# Patient Record
Sex: Male | Born: 1950
Health system: Southern US, Community
[De-identification: ages and names within clinical notes are randomized; demographics above are authoritative.]

## PROBLEM LIST (undated history)

## (undated) DIAGNOSIS — Z9289 Personal history of other medical treatment: Secondary | ICD-10-CM

## (undated) DIAGNOSIS — T4145XA Adverse effect of unspecified anesthetic, initial encounter: Secondary | ICD-10-CM

## (undated) DIAGNOSIS — G63 Polyneuropathy in diseases classified elsewhere: Secondary | ICD-10-CM

## (undated) DIAGNOSIS — M1990 Unspecified osteoarthritis, unspecified site: Secondary | ICD-10-CM

## (undated) DIAGNOSIS — Z95810 Presence of automatic (implantable) cardiac defibrillator: Secondary | ICD-10-CM

## (undated) DIAGNOSIS — N183 Chronic kidney disease, stage 3 unspecified: Secondary | ICD-10-CM

## (undated) DIAGNOSIS — Z720 Tobacco use: Secondary | ICD-10-CM

## (undated) DIAGNOSIS — I509 Heart failure, unspecified: Secondary | ICD-10-CM

## (undated) DIAGNOSIS — E349 Endocrine disorder, unspecified: Secondary | ICD-10-CM

## (undated) DIAGNOSIS — I350 Nonrheumatic aortic (valve) stenosis: Secondary | ICD-10-CM

## (undated) DIAGNOSIS — I10 Essential (primary) hypertension: Secondary | ICD-10-CM

## (undated) DIAGNOSIS — I2510 Atherosclerotic heart disease of native coronary artery without angina pectoris: Secondary | ICD-10-CM

## (undated) DIAGNOSIS — T8859XA Other complications of anesthesia, initial encounter: Secondary | ICD-10-CM

## (undated) DIAGNOSIS — K219 Gastro-esophageal reflux disease without esophagitis: Secondary | ICD-10-CM

## (undated) DIAGNOSIS — E1165 Type 2 diabetes mellitus with hyperglycemia: Secondary | ICD-10-CM

## (undated) DIAGNOSIS — I499 Cardiac arrhythmia, unspecified: Secondary | ICD-10-CM

## (undated) DIAGNOSIS — L97529 Non-pressure chronic ulcer of other part of left foot with unspecified severity: Secondary | ICD-10-CM

## (undated) DIAGNOSIS — IMO0002 Reserved for concepts with insufficient information to code with codable children: Secondary | ICD-10-CM

## (undated) DIAGNOSIS — E1140 Type 2 diabetes mellitus with diabetic neuropathy, unspecified: Secondary | ICD-10-CM

## (undated) DIAGNOSIS — I4892 Unspecified atrial flutter: Secondary | ICD-10-CM

## (undated) HISTORY — DX: Essential (primary) hypertension: I10

## (undated) HISTORY — DX: Chronic kidney disease, stage 3 unspecified: N18.30

## (undated) HISTORY — DX: Type 2 diabetes mellitus with diabetic neuropathy, unspecified: E11.40

## (undated) HISTORY — PX: COLONOSCOPY: SHX174

## (undated) HISTORY — DX: Chronic kidney disease, stage 3 (moderate): N18.3

## (undated) HISTORY — DX: Presence of automatic (implantable) cardiac defibrillator: Z95.810

## (undated) HISTORY — DX: Type 2 diabetes mellitus with hyperglycemia: E11.65

## (undated) HISTORY — DX: Personal history of other medical treatment: Z92.89

## (undated) HISTORY — DX: Atherosclerotic heart disease of native coronary artery without angina pectoris: I25.10

## (undated) HISTORY — DX: Nonrheumatic aortic (valve) stenosis: I35.0

## (undated) HISTORY — DX: Unspecified atrial flutter: I48.92

## (undated) HISTORY — PX: ESOPHAGOGASTRODUODENOSCOPY: SHX1529

## (undated) HISTORY — DX: Reserved for concepts with insufficient information to code with codable children: IMO0002

## (undated) HISTORY — DX: Tobacco use: Z72.0

## (undated) HISTORY — DX: Heart failure, unspecified: I50.9

## (undated) HISTORY — DX: Non-pressure chronic ulcer of other part of left foot with unspecified severity: L97.529

## (undated) SURGERY — Surgical Case
Anesthesia: *Unknown

---

## 1997-12-17 ENCOUNTER — Emergency Department (HOSPITAL_COMMUNITY): Admission: EM | Admit: 1997-12-17 | Discharge: 1997-12-17 | Payer: Self-pay | Admitting: Emergency Medicine

## 1999-10-12 ENCOUNTER — Emergency Department (HOSPITAL_COMMUNITY): Admission: EM | Admit: 1999-10-12 | Discharge: 1999-10-12 | Payer: Self-pay | Admitting: *Deleted

## 2005-06-02 ENCOUNTER — Emergency Department (HOSPITAL_COMMUNITY): Admission: EM | Admit: 2005-06-02 | Discharge: 2005-06-02 | Payer: Self-pay | Admitting: Family Medicine

## 2006-05-07 ENCOUNTER — Emergency Department (HOSPITAL_COMMUNITY): Admission: EM | Admit: 2006-05-07 | Discharge: 2006-05-07 | Payer: Self-pay | Admitting: Family Medicine

## 2006-11-10 ENCOUNTER — Emergency Department (HOSPITAL_COMMUNITY): Admission: EM | Admit: 2006-11-10 | Discharge: 2006-11-10 | Payer: Self-pay | Admitting: Family Medicine

## 2006-11-12 ENCOUNTER — Encounter (HOSPITAL_BASED_OUTPATIENT_CLINIC_OR_DEPARTMENT_OTHER): Admission: RE | Admit: 2006-11-12 | Discharge: 2007-02-10 | Payer: Self-pay | Admitting: Surgery

## 2006-11-17 ENCOUNTER — Ambulatory Visit (HOSPITAL_COMMUNITY): Admission: RE | Admit: 2006-11-17 | Discharge: 2006-11-17 | Payer: Self-pay | Admitting: Surgery

## 2006-11-30 ENCOUNTER — Ambulatory Visit: Payer: Self-pay | Admitting: Vascular Surgery

## 2007-02-17 ENCOUNTER — Encounter (HOSPITAL_BASED_OUTPATIENT_CLINIC_OR_DEPARTMENT_OTHER): Admission: RE | Admit: 2007-02-17 | Discharge: 2007-03-24 | Payer: Self-pay | Admitting: Surgery

## 2007-04-05 ENCOUNTER — Encounter (HOSPITAL_BASED_OUTPATIENT_CLINIC_OR_DEPARTMENT_OTHER): Admission: RE | Admit: 2007-04-05 | Discharge: 2007-07-04 | Payer: Self-pay | Admitting: Surgery

## 2007-05-04 ENCOUNTER — Emergency Department (HOSPITAL_COMMUNITY): Admission: EM | Admit: 2007-05-04 | Discharge: 2007-05-04 | Payer: Self-pay | Admitting: Emergency Medicine

## 2007-05-26 ENCOUNTER — Emergency Department (HOSPITAL_COMMUNITY): Admission: EM | Admit: 2007-05-26 | Discharge: 2007-05-26 | Payer: Self-pay | Admitting: Emergency Medicine

## 2007-08-01 ENCOUNTER — Encounter (HOSPITAL_BASED_OUTPATIENT_CLINIC_OR_DEPARTMENT_OTHER): Admission: RE | Admit: 2007-08-01 | Discharge: 2007-10-30 | Payer: Self-pay | Admitting: Surgery

## 2007-08-23 ENCOUNTER — Ambulatory Visit (HOSPITAL_COMMUNITY): Admission: RE | Admit: 2007-08-23 | Discharge: 2007-08-23 | Payer: Self-pay | Admitting: Surgery

## 2007-11-24 ENCOUNTER — Encounter (HOSPITAL_BASED_OUTPATIENT_CLINIC_OR_DEPARTMENT_OTHER): Admission: RE | Admit: 2007-11-24 | Discharge: 2007-12-16 | Payer: Self-pay | Admitting: Surgery

## 2008-03-22 ENCOUNTER — Emergency Department (HOSPITAL_COMMUNITY): Admission: EM | Admit: 2008-03-22 | Discharge: 2008-03-22 | Payer: Self-pay | Admitting: Family Medicine

## 2008-05-30 ENCOUNTER — Emergency Department (HOSPITAL_COMMUNITY): Admission: EM | Admit: 2008-05-30 | Discharge: 2008-05-30 | Payer: Self-pay | Admitting: Emergency Medicine

## 2008-06-14 ENCOUNTER — Encounter (HOSPITAL_BASED_OUTPATIENT_CLINIC_OR_DEPARTMENT_OTHER): Admission: RE | Admit: 2008-06-14 | Discharge: 2008-07-05 | Payer: Self-pay | Admitting: Internal Medicine

## 2008-07-12 ENCOUNTER — Encounter (HOSPITAL_BASED_OUTPATIENT_CLINIC_OR_DEPARTMENT_OTHER): Admission: RE | Admit: 2008-07-12 | Discharge: 2008-08-13 | Payer: Self-pay | Admitting: Internal Medicine

## 2008-11-06 ENCOUNTER — Emergency Department (HOSPITAL_COMMUNITY): Admission: EM | Admit: 2008-11-06 | Discharge: 2008-11-06 | Payer: Self-pay | Admitting: Family Medicine

## 2009-07-06 HISTORY — PX: OTHER SURGICAL HISTORY: SHX169

## 2009-11-04 ENCOUNTER — Ambulatory Visit: Payer: Self-pay | Admitting: Cardiology

## 2009-11-04 ENCOUNTER — Inpatient Hospital Stay (HOSPITAL_COMMUNITY): Admission: EM | Admit: 2009-11-04 | Discharge: 2009-11-09 | Payer: Self-pay | Admitting: Emergency Medicine

## 2009-11-04 ENCOUNTER — Ambulatory Visit: Payer: Self-pay | Admitting: Radiology

## 2009-11-04 ENCOUNTER — Emergency Department (HOSPITAL_BASED_OUTPATIENT_CLINIC_OR_DEPARTMENT_OTHER): Admission: EM | Admit: 2009-11-04 | Discharge: 2009-11-04 | Payer: Self-pay | Admitting: Emergency Medicine

## 2009-11-05 ENCOUNTER — Other Ambulatory Visit: Payer: Self-pay | Admitting: Cardiology

## 2009-11-05 ENCOUNTER — Encounter (INDEPENDENT_AMBULATORY_CARE_PROVIDER_SITE_OTHER): Payer: Self-pay | Admitting: Internal Medicine

## 2009-11-05 ENCOUNTER — Encounter: Payer: Self-pay | Admitting: Internal Medicine

## 2009-11-05 LAB — CONVERTED CEMR LAB: Hgb A1c MFr Bld: 11.5 %

## 2009-11-06 ENCOUNTER — Ambulatory Visit: Payer: Self-pay | Admitting: Cardiology

## 2009-11-06 ENCOUNTER — Other Ambulatory Visit: Payer: Self-pay | Admitting: Cardiology

## 2009-11-06 ENCOUNTER — Encounter: Payer: Self-pay | Admitting: Internal Medicine

## 2009-11-06 LAB — CONVERTED CEMR LAB
Eosinophils Relative: 1 %
HCT: 40.8 %
Hemoglobin: 13.4 g/dL
LDL Cholesterol: 177 mg/dL
Lymphocytes, automated: 27 %
MCV: 85.9 fL
Monocytes Relative: 7 %
Neutrophils Relative %: 63 %
Platelets: 247 10*3/uL
RBC: 4.75 M/uL
RBC: 5.12 M/uL
RDW: 12.7 %

## 2009-11-07 ENCOUNTER — Other Ambulatory Visit: Payer: Self-pay | Admitting: Cardiology

## 2009-11-07 ENCOUNTER — Encounter: Payer: Self-pay | Admitting: Internal Medicine

## 2009-11-07 LAB — CONVERTED CEMR LAB
Glucose, Bld: 193 mg/dL
HCT: 13.2 %
MCV: 86.8 fL
Platelets: 264 10*3/uL
RBC: 4.59 M/uL

## 2009-11-08 ENCOUNTER — Other Ambulatory Visit: Payer: Self-pay | Admitting: Cardiology

## 2009-11-09 ENCOUNTER — Other Ambulatory Visit: Payer: Self-pay | Admitting: Cardiology

## 2009-11-09 ENCOUNTER — Encounter: Payer: Self-pay | Admitting: Internal Medicine

## 2009-11-09 LAB — CONVERTED CEMR LAB
BUN: 25 mg/dL
Glucose, Bld: 175 mg/dL
Sodium: 137 meq/L

## 2009-11-14 ENCOUNTER — Ambulatory Visit: Payer: Self-pay | Admitting: Cardiology

## 2009-11-14 DIAGNOSIS — I10 Essential (primary) hypertension: Secondary | ICD-10-CM

## 2009-11-14 DIAGNOSIS — E1159 Type 2 diabetes mellitus with other circulatory complications: Secondary | ICD-10-CM

## 2009-11-14 DIAGNOSIS — I152 Hypertension secondary to endocrine disorders: Secondary | ICD-10-CM | POA: Insufficient documentation

## 2009-11-19 LAB — CONVERTED CEMR LAB
Calcium: 9.3 mg/dL (ref 8.4–10.5)
GFR calc non Af Amer: 67.55 mL/min (ref >60)
Potassium: 5 meq/L (ref 3.5–5.1)
Sodium: 140 meq/L (ref 135–145)

## 2009-11-21 ENCOUNTER — Encounter: Payer: Self-pay | Admitting: Internal Medicine

## 2009-11-21 DIAGNOSIS — N183 Chronic kidney disease, stage 3 unspecified: Secondary | ICD-10-CM | POA: Insufficient documentation

## 2009-11-21 DIAGNOSIS — E785 Hyperlipidemia, unspecified: Secondary | ICD-10-CM | POA: Insufficient documentation

## 2009-11-21 DIAGNOSIS — I739 Peripheral vascular disease, unspecified: Secondary | ICD-10-CM | POA: Insufficient documentation

## 2009-11-21 DIAGNOSIS — E1140 Type 2 diabetes mellitus with diabetic neuropathy, unspecified: Secondary | ICD-10-CM

## 2009-11-21 DIAGNOSIS — F17200 Nicotine dependence, unspecified, uncomplicated: Secondary | ICD-10-CM | POA: Insufficient documentation

## 2009-11-21 DIAGNOSIS — I2510 Atherosclerotic heart disease of native coronary artery without angina pectoris: Secondary | ICD-10-CM | POA: Insufficient documentation

## 2009-11-21 DIAGNOSIS — E1165 Type 2 diabetes mellitus with hyperglycemia: Secondary | ICD-10-CM

## 2009-11-21 DIAGNOSIS — IMO0002 Reserved for concepts with insufficient information to code with codable children: Secondary | ICD-10-CM | POA: Insufficient documentation

## 2009-11-22 ENCOUNTER — Ambulatory Visit: Payer: Self-pay | Admitting: Internal Medicine

## 2009-11-22 ENCOUNTER — Encounter: Payer: Self-pay | Admitting: Cardiology

## 2009-11-22 ENCOUNTER — Telehealth (INDEPENDENT_AMBULATORY_CARE_PROVIDER_SITE_OTHER): Payer: Self-pay | Admitting: *Deleted

## 2009-11-22 DIAGNOSIS — R269 Unspecified abnormalities of gait and mobility: Secondary | ICD-10-CM | POA: Insufficient documentation

## 2009-11-22 DIAGNOSIS — M4712 Other spondylosis with myelopathy, cervical region: Secondary | ICD-10-CM | POA: Insufficient documentation

## 2009-11-27 ENCOUNTER — Ambulatory Visit: Payer: Self-pay | Admitting: Cardiology

## 2009-11-28 ENCOUNTER — Ambulatory Visit: Payer: Self-pay | Admitting: Endocrinology

## 2009-11-28 DIAGNOSIS — G622 Polyneuropathy due to other toxic agents: Secondary | ICD-10-CM

## 2009-11-28 DIAGNOSIS — G619 Inflammatory polyneuropathy, unspecified: Secondary | ICD-10-CM | POA: Insufficient documentation

## 2009-11-28 LAB — CONVERTED CEMR LAB
Hgb A1c MFr Bld: 10.5 % — ABNORMAL HIGH (ref 4.6–6.5)
Microalb Creat Ratio: 1.4 mg/g (ref 0.0–30.0)

## 2009-11-29 ENCOUNTER — Encounter: Payer: Self-pay | Admitting: Cardiology

## 2009-12-03 ENCOUNTER — Ambulatory Visit: Payer: Self-pay | Admitting: Cardiology

## 2009-12-03 LAB — CONVERTED CEMR LAB
CO2: 24 meq/L (ref 19–32)
Calcium: 9.1 mg/dL (ref 8.4–10.5)
Creatinine, Ser: 1.4 mg/dL (ref 0.4–1.5)

## 2009-12-05 ENCOUNTER — Telehealth: Payer: Self-pay | Admitting: Internal Medicine

## 2009-12-05 ENCOUNTER — Ambulatory Visit: Payer: Self-pay | Admitting: Cardiology

## 2009-12-07 ENCOUNTER — Ambulatory Visit (HOSPITAL_COMMUNITY): Admission: RE | Admit: 2009-12-07 | Discharge: 2009-12-07 | Payer: Self-pay | Admitting: Internal Medicine

## 2009-12-12 ENCOUNTER — Telehealth: Payer: Self-pay | Admitting: Internal Medicine

## 2009-12-19 ENCOUNTER — Ambulatory Visit: Payer: Self-pay | Admitting: Cardiology

## 2009-12-19 DIAGNOSIS — I5022 Chronic systolic (congestive) heart failure: Secondary | ICD-10-CM | POA: Insufficient documentation

## 2009-12-23 LAB — CONVERTED CEMR LAB
BUN: 31 mg/dL — ABNORMAL HIGH (ref 6–23)
Creatinine, Ser: 1.1 mg/dL (ref 0.4–1.5)
GFR calc non Af Amer: 86.82 mL/min (ref >60)
Potassium: 4.5 meq/L (ref 3.5–5.1)
Pro B Natriuretic peptide (BNP): 288.1 pg/mL — ABNORMAL HIGH (ref 0.0–100.0)

## 2009-12-24 ENCOUNTER — Encounter: Payer: Self-pay | Admitting: Internal Medicine

## 2009-12-28 ENCOUNTER — Encounter: Admission: RE | Admit: 2009-12-28 | Discharge: 2009-12-28 | Payer: Self-pay | Admitting: Neurological Surgery

## 2010-01-02 ENCOUNTER — Ambulatory Visit: Payer: Self-pay | Admitting: Internal Medicine

## 2010-01-09 ENCOUNTER — Telehealth: Payer: Self-pay | Admitting: Cardiology

## 2010-01-09 ENCOUNTER — Ambulatory Visit: Payer: Self-pay | Admitting: Cardiology

## 2010-01-13 LAB — CONVERTED CEMR LAB
Albumin: 3.8 g/dL (ref 3.5–5.2)
Bilirubin, Direct: 0.1 mg/dL (ref 0.0–0.3)
Cholesterol: 92 mg/dL (ref 0–200)
HDL: 22.7 mg/dL — ABNORMAL LOW (ref >39.00)
Total Protein: 6.8 g/dL (ref 6.0–8.3)
Triglycerides: 71 mg/dL (ref 0.0–149.0)

## 2010-02-13 ENCOUNTER — Telehealth: Payer: Self-pay | Admitting: Cardiology

## 2010-03-03 ENCOUNTER — Ambulatory Visit: Payer: Self-pay | Admitting: Cardiology

## 2010-03-03 ENCOUNTER — Encounter: Payer: Self-pay | Admitting: Cardiology

## 2010-03-03 ENCOUNTER — Ambulatory Visit (HOSPITAL_COMMUNITY): Admission: RE | Admit: 2010-03-03 | Discharge: 2010-03-03 | Payer: Self-pay | Admitting: Cardiology

## 2010-03-03 ENCOUNTER — Ambulatory Visit: Payer: Self-pay

## 2010-03-14 ENCOUNTER — Telehealth: Payer: Self-pay | Admitting: Internal Medicine

## 2010-03-19 ENCOUNTER — Encounter: Payer: Self-pay | Admitting: Cardiology

## 2010-03-28 ENCOUNTER — Ambulatory Visit: Payer: Self-pay | Admitting: Cardiology

## 2010-03-28 DIAGNOSIS — I4892 Unspecified atrial flutter: Secondary | ICD-10-CM | POA: Insufficient documentation

## 2010-03-31 ENCOUNTER — Encounter: Payer: Self-pay | Admitting: Cardiology

## 2010-04-02 ENCOUNTER — Ambulatory Visit: Payer: Self-pay | Admitting: Cardiology

## 2010-04-03 ENCOUNTER — Ambulatory Visit: Payer: Self-pay | Admitting: Internal Medicine

## 2010-04-04 ENCOUNTER — Ambulatory Visit: Payer: Self-pay | Admitting: Cardiology

## 2010-04-04 ENCOUNTER — Ambulatory Visit: Payer: Self-pay | Admitting: Internal Medicine

## 2010-04-04 ENCOUNTER — Telehealth: Payer: Self-pay | Admitting: Internal Medicine

## 2010-04-04 ENCOUNTER — Ambulatory Visit (HOSPITAL_COMMUNITY): Admission: RE | Admit: 2010-04-04 | Discharge: 2010-04-04 | Payer: Self-pay | Admitting: Cardiology

## 2010-04-04 LAB — CONVERTED CEMR LAB
Calcium: 9.6 mg/dL (ref 8.4–10.5)
Creatinine, Ser: 1.6 mg/dL — ABNORMAL HIGH (ref 0.4–1.5)
GFR calc non Af Amer: 55.67 mL/min (ref >60)
Glucose, Bld: 165 mg/dL — ABNORMAL HIGH (ref 70–99)
HDL: 26 mg/dL — ABNORMAL LOW (ref >39.00)
POC INR: 1.8
Sodium: 139 meq/L (ref 135–145)

## 2010-04-07 ENCOUNTER — Ambulatory Visit: Payer: Self-pay | Admitting: Cardiology

## 2010-04-07 LAB — CONVERTED CEMR LAB: POC INR: 2.4

## 2010-04-09 ENCOUNTER — Ambulatory Visit: Payer: Self-pay | Admitting: Internal Medicine

## 2010-04-14 ENCOUNTER — Ambulatory Visit: Payer: Self-pay | Admitting: Cardiology

## 2010-04-14 ENCOUNTER — Telehealth: Payer: Self-pay | Admitting: Internal Medicine

## 2010-04-14 LAB — CONVERTED CEMR LAB: POC INR: 2

## 2010-04-23 ENCOUNTER — Encounter: Payer: Self-pay | Admitting: Internal Medicine

## 2010-04-29 ENCOUNTER — Ambulatory Visit: Payer: Self-pay | Admitting: Cardiology

## 2010-05-05 ENCOUNTER — Ambulatory Visit: Payer: Self-pay | Admitting: Cardiology

## 2010-05-05 ENCOUNTER — Ambulatory Visit: Payer: Self-pay | Admitting: Internal Medicine

## 2010-05-06 DIAGNOSIS — Z95810 Presence of automatic (implantable) cardiac defibrillator: Secondary | ICD-10-CM

## 2010-05-06 HISTORY — DX: Presence of automatic (implantable) cardiac defibrillator: Z95.810

## 2010-05-08 ENCOUNTER — Encounter: Payer: Self-pay | Admitting: Internal Medicine

## 2010-05-13 ENCOUNTER — Ambulatory Visit (HOSPITAL_COMMUNITY)
Admission: RE | Admit: 2010-05-13 | Discharge: 2010-05-14 | Payer: Self-pay | Source: Home / Self Care | Admitting: Internal Medicine

## 2010-05-13 ENCOUNTER — Ambulatory Visit: Payer: Self-pay | Admitting: Internal Medicine

## 2010-05-20 ENCOUNTER — Ambulatory Visit: Payer: Self-pay | Admitting: Cardiovascular Disease

## 2010-05-20 ENCOUNTER — Telehealth: Payer: Self-pay | Admitting: Internal Medicine

## 2010-05-20 LAB — CONVERTED CEMR LAB: POC INR: 1.4

## 2010-05-22 ENCOUNTER — Ambulatory Visit: Payer: Self-pay

## 2010-05-28 ENCOUNTER — Ambulatory Visit: Payer: Self-pay | Admitting: Cardiology

## 2010-06-05 ENCOUNTER — Ambulatory Visit: Payer: Self-pay | Admitting: Cardiology

## 2010-06-05 ENCOUNTER — Encounter: Payer: Self-pay | Admitting: Internal Medicine

## 2010-06-06 LAB — CONVERTED CEMR LAB
BUN: 35 mg/dL — ABNORMAL HIGH (ref 6–23)
CO2: 24 meq/L (ref 19–32)
Calcium: 9.1 mg/dL (ref 8.4–10.5)
Creatinine, Ser: 1.6 mg/dL — ABNORMAL HIGH (ref 0.4–1.5)
GFR calc non Af Amer: 55.64 mL/min (ref >60)
Glucose, Bld: 158 mg/dL — ABNORMAL HIGH (ref 70–99)
Sodium: 138 meq/L (ref 135–145)

## 2010-06-10 ENCOUNTER — Telehealth: Payer: Self-pay | Admitting: Cardiology

## 2010-06-24 ENCOUNTER — Ambulatory Visit: Payer: Self-pay | Admitting: Cardiology

## 2010-06-26 LAB — CONVERTED CEMR LAB
GFR calc non Af Amer: 51.59 mL/min — ABNORMAL LOW (ref >60.00)
Glucose, Bld: 149 mg/dL — ABNORMAL HIGH (ref 70–99)
Potassium: 4.8 meq/L (ref 3.5–5.1)
Sodium: 140 meq/L (ref 135–145)

## 2010-06-27 ENCOUNTER — Encounter: Payer: Self-pay | Admitting: Internal Medicine

## 2010-07-03 ENCOUNTER — Ambulatory Visit: Payer: Self-pay | Admitting: Cardiology

## 2010-07-03 LAB — CONVERTED CEMR LAB: POC INR: 2.3

## 2010-07-04 ENCOUNTER — Encounter: Payer: Self-pay | Admitting: Cardiology

## 2010-07-17 ENCOUNTER — Ambulatory Visit: Admission: RE | Admit: 2010-07-17 | Discharge: 2010-07-17 | Payer: Self-pay | Source: Home / Self Care

## 2010-07-24 ENCOUNTER — Ambulatory Visit: Admission: RE | Admit: 2010-07-24 | Payer: Self-pay | Source: Home / Self Care

## 2010-07-31 ENCOUNTER — Ambulatory Visit: Admission: RE | Admit: 2010-07-31 | Discharge: 2010-07-31 | Payer: Self-pay | Source: Home / Self Care

## 2010-07-31 LAB — CONVERTED CEMR LAB: POC INR: 3.8

## 2010-08-03 LAB — CONVERTED CEMR LAB
BUN: 24 mg/dL — ABNORMAL HIGH (ref 6–23)
Basophils Absolute: 0 10*3/uL (ref 0.0–0.1)
CO2: 24 meq/L (ref 19–32)
Calcium: 9.2 mg/dL (ref 8.4–10.5)
Calcium: 9.4 mg/dL (ref 8.4–10.5)
Chloride: 106 meq/L (ref 96–112)
Creatinine, Ser: 1.3 mg/dL (ref 0.4–1.5)
Creatinine, Ser: 1.3 mg/dL (ref 0.4–1.5)
Eosinophils Absolute: 0 10*3/uL (ref 0.0–0.7)
GFR calc non Af Amer: 72.26 mL/min (ref >60)
Glucose, Bld: 194 mg/dL — ABNORMAL HIGH (ref 70–99)
INR: 1 (ref 0.8–1.0)
Lymphocytes Relative: 32.5 % (ref 12.0–46.0)
MCHC: 33.7 g/dL (ref 30.0–36.0)
Monocytes Absolute: 0.6 10*3/uL (ref 0.1–1.0)
Neutro Abs: 4.7 10*3/uL (ref 1.4–7.7)
Neutrophils Relative %: 58.9 % (ref 43.0–77.0)
POC INR: 1
Pro B Natriuretic peptide (BNP): 164.7 pg/mL — ABNORMAL HIGH (ref 0.0–100.0)
RDW: 14.1 % (ref 11.5–14.6)

## 2010-08-05 NOTE — Miscellaneous (Signed)
Summary: Orders Update  Clinical Lists Changes  Orders: Added new Test order of T-Basic Metabolic Panel (99991111) - Signed Added new Test order of T-CBC w/Diff [phone]) - Signed Added new Test order of T-PTT [id number]) - Signed Added new Test order of T-Protime, Auto [id number]) - Signed

## 2010-08-05 NOTE — Assessment & Plan Note (Signed)
Summary: per check out/sf   Visit Type:  1 mo f/u Referring Provider:  Dr Aundra Dubin Primary Provider:  Rowe Clack MD  CC:  no cardiac complaints today.  History of Present Illness: 60 yo AAM with history of CAD and mixed ischemic/nonischemic cardiomyopathy (EF 15%), NYHA Class III CHF, and diabetes who presents for EP consultation.  He initially presented 5/11 with acute decompensated CHF after several days of shortness of breath, orthopnea, and PND.   He was diuresed and left/right heart catheterization was done.  Left heart cath showed large, ectatic coronaries with slow flow and moderate to severe diffuse distal vessel disease (consistent with diabetes) without good interventional options.  EF was 15-20% by echo.  He was put on milrinone and further diuresed for several more days.  He was titrated off milrinone and discharged on an oral medication regimen.  He recently developed atrial flutter requiring cardioversion.  He remains in NSR today after cardioversion.  Patient feels better after cardioversion. His ambulation is still limited by his orthopedic and neuropathic problems, but he denies any significant dyspnea.  He is able to walk around his house without problems. No chest pain.  Weight is actually up about 4 lbs since last appointment.  He has seen Dr. Rayann Heman and is going to get an ICD in November.    ECG: NSR, old inferior infarction, old anterior infarction  Labs (9/11): LDL 61, HDL 26, K 4.8, creatinine 1.6  Current Medications (verified): 1)  Px Enteric Aspirin 81 Mg Tbec (Aspirin) .... Take One Daily 2)  Coreg 12.5 Mg Tabs (Carvedilol) .... One Twice A Day 3)  Furosemide 40 Mg Tabs (Furosemide) .... Take 1 By Mouth Once Daily 4)  Glipizide 5 Mg Xr24h-Tab (Glipizide) .Marland Kitchen.. 1 By Mouth Two Times A Day 5)  Lisinopril 5 Mg Tabs (Lisinopril) .... One Twice A Day 6)  Crestor 20 Mg Tabs (Rosuvastatin Calcium) .... Take 1 By Mouth Once Daily 7)  Fish Oil 1000 Mg Caps (Omega-3  Fatty Acids) .... Take 1 By Mouth Once Daily As Needed 8)  Multivitamins  Tabs (Multiple Vitamin) .... Take 1 By Mouth Once Daily 9)  B Complex 100  Tabs (B Complex Vitamins) .... Take 1 By Mouth Once Daily As Needed 10)  Spironolactone 25 Mg Tabs (Spironolactone) .... Take 1  By Mouth Once Daily 11)  Warfarin Sodium 5 Mg Tabs (Warfarin Sodium) .... One Daily or As Directed 12)  Onetouch Ultra Blue  Strp (Glucose Blood) .... Use As Directed Once Daily  Allergies: 1)  ! Erythromycin  Past History:  Past Medical History: 1. CAD: LHC (5/11) with 50-60% mid LAD, diffuse moderate distal LAD up to 60-70%, small OM2 subtotally occluded, moderate to severe diffuse distal PLOM disease, severe diffuse distal RCA, PDA, and PLV disease.  Coronaries were ectatic with slow flow.  Patient was started on Plavix given concern for thrombus formation in the ectatic coronaries.   2. CHF: Systolic dysfunction, likely mixed ischemic/nonischemic.  Echo (5/11) with EF 15-20%, severe global hypokinesis, mild MR, moderate diastolic dysfunction, mildly decreased RV systolic function.  RHC (5/11) with mean RA 14 mmHg, PA 38/26, mean PCWP 25 mmHg, CI 1.8.  Repeat echo (8/11) with EF 0000000, severe diastolic dysfunction, mild MR, RV normal size and systolic function.  Per official read, cannot rule out apical clot but it does appear to be only trabeculations.  TEE (9/11): EF 15% with global hypokinesis, mild to moderate MR, no LV thrombus and no LAA thrombus.  3. Type II diabetes, uncontrolled 4. HTN 5. Smoker 6. severe diabetic neuropathy -with chronic gait dysfunction 7. History of left foot ulcer with poor healing  8. Atrial flutter s/p [facility] 9/11  Family History: Reviewed history from 11/28/2009 and no changes required. Family History of Colon CA 1st degree relative <60 9other relative) Heart disease (grandparent) Stroke (other relative) dm only in secondary relatives  Social History: Reviewed history from  04/09/2010 and no changes required. Pt lives in [place].  Smoker, about 1/4 ppd.  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   Vital Signs:  Patient profile:   60 year old male Height:      76 inches Weight:      256.12 pounds BMI:     31.29 Pulse rate:   64 / minute Pulse rhythm:   regular BP sitting:   96 / 62  (left arm) Cuff size:   large  Vitals Entered By: Julaine Hua, CMA (April 29, 2010 8:35 AM)  Physical Exam  General:  chronically ill, NAD Neck:  Neck supple, no JVD. No masses, thyromegaly or abnormal cervical nodes. Lungs:  Clear bilaterally to auscultation and percussion. Heart:  Non-displaced PMI, chest non-tender; regular rate and rhythm, S1, S2 without murmurs, rubs or gallops. Carotid upstroke normal, no bruit. I am unable to palpate his pedal pulses but feet are warm. No edema, no varicosities. Abdomen:  Bowel sounds positive; abdomen soft and non-tender without masses, organomegaly, or hernias noted. No hepatosplenomegaly. Extremities:  No clubbing or cyanosis. Neurologic:  Alert and oriented x 3. Psych:  Normal affect.   Impression & Recommendations:  Problem # 1:  CHRONIC SYSTOLIC HEART FAILURE (123456) Systolic CHF.  This may be multifactorial, due to significant distal vessel CAD as well as a history of poorly controlled diabetes.  He has NYHA class II-III symptoms.  He does not appear significantly volume overloaded on exam though weight is up a bit.  I will have him continue Coreg, lisinopril, and spironolactone at current doses as BP is a bit marginal today.  Will work on increasing ACEI at next appointment.  Eventually would benefit from Bidil if BP will tolerate.  - BMET/BNP today - ICD (Dr. Rayann Heman) in November.  Given tenuous volume status will see if we can get Medtronic with Optivol.   Problem # 2:  ATRIAL FLUTTER  (ICD-427.32) Remains in NSR after DCCV.  If atrial flutter recurs, would consider ablation.  He will continue warfarin.   Problem # 3:  CORONARY ATHEROSCLEROSIS NATIVE CORONARY ARTERY (ICD-414.01) Diffuse moderate to severe distal vessel disease on cath with ectatic coronaries with slow flow.  No interventional target.  He has never had chest pain. Continue ASA, statin, ACEI, beta blocker.   LDL was at goal (<70) when assessed in 9/11.  Other Orders: TLB-BNP (B-Natriuretic Peptide) (83880-BNPR) TLB-BMP (Basic Metabolic Panel-BMET) (99991111)  Patient Instructions: 1)  Your physician recommends that you have lab today---BMP/BNP  428.22  427.32 2)  Your physician recommends that you schedule a follow-up appointment in: 2 months with Dr Aundra Dubin.

## 2010-08-05 NOTE — Assessment & Plan Note (Signed)
Summary: eph/post icd implant.amber   Visit Type:  eph Primary Provider:  Rowe Clack MD  CC:  pt stated feeling better.  History of Present Illness: 60 yo AAM with history of CAD and mixed ischemic/nonischemic cardiomyopathy (EF 15%), NYHA Class III CHF, and diabetes who presents for EP consultation.  He initially presented 5/11 with acute decompensated CHF after several days of shortness of breath, orthopnea, and PND.   He was diuresed and left/right heart catheterization was done.  Left heart cath showed large, ectatic coronaries with slow flow and moderate to severe diffuse distal vessel disease (consistent with diabetes) without good interventional options.  EF was 15-20% by echo.  He was put on milrinone and further diuresed for several more days.  He was titrated off milrinone and discharged on an oral medication regimen.  He recently developed atrial flutter requiring cardioversion.  He remains in NSR today after cardioversion.  Finally, patient had Medtronic ICD placed in 11/11.   Patient is stable to improved. His ambulation is still limited by his orthopedic and neuropathic problems, but he denies any significant dyspnea.  He is able to walk around his house without problems. No chest pain.  Weight is down 8 lbs since last appointment.  No orthopnea or PND.  No chest pain.   Optivol was checked today showing thoracic impedance in general trending upwards (good).   Labs (9/11): LDL 61, HDL 26, K 4.8, creatinine 1.6 Labs (10/11): K 4.3, creatinine 1.3, BNP 165 Labs (11/11): K 4.9, creatinine 1.6  Current Medications (verified): 1)  Px Enteric Aspirin 81 Mg Tbec (Aspirin) .... Take One Daily 2)  Coreg 12.5 Mg Tabs (Carvedilol) .... One Twice A Day 3)  Furosemide 40 Mg Tabs (Furosemide) .... Take 1 By Mouth Once Daily 4)  Glipizide 5 Mg Xr24h-Tab (Glipizide) .Marland Kitchen.. 1 By Mouth Two Times A Day 5)  Lisinopril 5 Mg Tabs (Lisinopril) .... One Twice A Day 6)  Crestor 20 Mg Tabs  (Rosuvastatin Calcium) .... Take 1 By Mouth Once Daily 7)  Multivitamins  Tabs (Multiple Vitamin) .... Take 1 By Mouth Once Daily 8)  B Complex 100  Tabs (B Complex Vitamins) .... Take 1 By Mouth Once Daily As Needed 9)  Spironolactone 25 Mg Tabs (Spironolactone) .... Take 1  By Mouth Once Daily 10)  Warfarin Sodium 5 Mg Tabs (Warfarin Sodium) .... One Daily or As Directed 11)  Onetouch Ultra Blue  Strp (Glucose Blood) .... Use As Directed Once Daily  Allergies (verified): 1)  ! Erythromycin  Past History:  Past Medical History: 1. CAD: LHC (5/11) with 50-60% mid LAD, diffuse moderate distal LAD up to 60-70%, small OM2 subtotally occluded, moderate to severe diffuse distal PLOM disease, severe diffuse distal RCA, PDA, and PLV disease.  Coronaries were ectatic with slow flow.  Patient was started on Plavix given concern for thrombus formation in the ectatic coronaries.  He stopped Plavix after starting coumadin with atrial flutter.  2. CHF: Systolic dysfunction, likely mixed ischemic/nonischemic.  Echo (5/11) with EF 15-20%, severe global hypokinesis, mild MR, moderate diastolic dysfunction, mildly decreased RV systolic function.  RHC (5/11) with mean RA 14 mmHg, PA 38/26, mean PCWP 25 mmHg, CI 1.8.  Repeat echo (8/11) with EF 0000000, severe diastolic dysfunction, mild MR, RV normal size and systolic function.  Per official read, cannot rule out apical clot but it does appear to be only trabeculations.  TEE (9/11): EF 15% with global hypokinesis, mild to moderate MR, no LV thrombus and  no LAA thrombus.  3. Type II diabetes, uncontrolled 4. HTN 5. Smoker 6. severe diabetic neuropathy -with chronic gait dysfunction 7. History of left foot ulcer with poor healing  8. Atrial flutter s/p [facility] 9/11 9. Medtronic dual chamber ICD with Optivol (11/11)  Family History: Reviewed history from 11/28/2009 and no changes required. Family History of Colon CA 1st degree relative <60 9other relative) Heart  disease (grandparent) Stroke (other relative) dm only in secondary relatives  Social History: Reviewed history from 04/09/2010 and no changes required. Pt lives in [place].  Smoker, about 1/4 ppd.  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   Vital Signs:  Patient profile:   60 year old male Height:      76 inches Weight:      248 pounds BMI:     30.30 Pulse rate:   72 / minute BP sitting:   102 / 58  (right arm) Cuff size:   regular  Vitals Entered By: Hansel Feinstein CMA (June 05, 2010 8:42 AM)  Physical Exam  General:  Well developed, well nourished, in no acute distress. Neck:  Neck supple, no JVD. No masses, thyromegaly or abnormal cervical nodes. Lungs:  Clear bilaterally to auscultation and percussion. Heart:  Non-displaced PMI, chest non-tender; regular rate and rhythm, S1, S2 without murmurs, rubs or gallops. Carotid upstroke normal, no bruit. I am unable to palpate his pedal pulses but feet are warm. No edema, no varicosities. Abdomen:  Bowel sounds positive; abdomen soft and non-tender without masses, organomegaly, or hernias noted. No hepatosplenomegaly. Extremities:  No clubbing or cyanosis. Neurologic:  Alert and oriented x 3. Psych:  Normal affect.    ICD Specifications Following MD:  Thompson Grayer, MD     ICD Vendor:  Medtronic     ICD Model Number:  [id number]     ICD Serial Number:  [id number] ICD DOI:  05/13/2010     ICD Implanting MD:  Thompson Grayer, MD  Lead 1:    Location: RA     DOI: 05/13/2010     Model #: [id number]     Serial #: [id number]     Status: active Lead 2:    Location: RV     DOI: 05/13/2010     Model #: [id number]     Serial #[id number]     Status: active  Indications::  ICM   ICD Follow Up ICD Dependent:  No      Episodes Coumadin:  Yes  Brady Parameters Mode DDD+     Lower Rate Limit:  60     Upper Rate Limit 130 PAV  200     Sensed AV Delay:  180  Tachy Zones VF:  200     Impression & Recommendations:  Problem # 1:  ATRIAL FLUTTER (ICD-427.32) Remains in NSR after DCCV.  If atrial flutter recurs, would consider ablation.  He will continue warfarin.   Problem # 2:  CORONARY ATHEROSCLEROSIS NATIVE CORONARY ARTERY (ICD-414.01) Diffuse moderate to severe distal vessel disease on cath with ectatic coronaries with slow flow.  No interventional target.  He has never had chest pain. Continue ASA, statin, ACEI, beta blocker.   LDL was at goal (<70) when assessed in 9/11.  Problem # 3:  CHRONIC SYSTOLIC HEART FAILURE (123456) Systolic CHF.  This may be multifactorial, due to significant  distal vessel CAD as well as a history of poorly controlled diabetes.  He has NYHA class II-III symptoms.  He does not appear significantly volume overloaded on exam with weight down.  I will have him continue Coreg, lisinopril, and spironolactone at current doses.  He has a Medtronic ICD with Optivol and thoracic impedance in general has been trending upwards.   - Start low dose hydralazine at 12.5 mg three times a day with isordil 10 mg three times a day.  He will keep an eye on his BP and let me know if it is getting low.  - Continue current doses of Coreg, Lasix, lisinopril, and spironolactone.  Will need to keep an eye on his creatinine and potassium, repeat labs in 1 month.   Problem # 4:  HYPERLIPIDEMIA (ICD-272.4) I will change statin to atorvastatin 80 mg daily now that it is generic.   PT consult to help him with his gait difficulties.   Followup 3 months.   Other Orders: Misc. Referral (Misc. Ref) TLB-BMP (Basic Metabolic Panel-BMET) (99991111)  Patient Instructions: 1)  Your physician has recommended you make the following change in your medication:  2)  Start Lipitor 80mg  daily when you finish your current supply of Crestor. 3)  Start Hydralazine 12.5mg  three times a day--this will be one-half of a 25mg   tablet three times a day. 4)  Start Isosorbide 10mg  three times a day. 5)  Lab today--BMP--428.22   414.01 6)  You have been referred to [facility] for physical therapy evaluation. 7)  Fasting lab 2 months after you start Lipitor---lipid profile/liver profile---428.22 414.01 8)  Your physician recommends that you schedule a follow-up appointment in: 3 months with Dr Aundra Dubin. Prescriptions: ISOSORBIDE DINITRATE 10 MG TABS (ISOSORBIDE DINITRATE) one three times a day  #90 x 6   Entered by:   Desiree Lucy, RN, BSN   Authorized by:   [name], MD   Signed by:   Desiree Lucy, RN, BSN on 06/05/2010   Method used:   Electronically to        [facility]  [phone]* (retail)       [place], [place]  [address]       Ph: [phone] or [phone]       Fax: [phone]   RxID:   [id number] HYDRALAZINE HCL 25 MG TABS (HYDRALAZINE HCL) one-half three times a day  #45 x 3   Entered by:   Desiree Lucy, RN, BSN   Authorized by:   [name], MD   Signed by:   Desiree Lucy, RN, BSN on 06/05/2010   Method used:   Electronically to        [facility]  [phone]* (retail)       [address]       [place], [place]  [address]       Ph: [phone] or [phone]       Fax: [phone]   RxID:   [id number] LIPITOR 80 MG TABS (ATORVASTATIN CALCIUM) one daily  #30 x 3   Entered by:   Desiree Lucy, RN, BSN   Authorized by:   [name], MD   Signed by:   Desiree Lucy, RN, BSN on 06/05/2010   Method used:   Electronically to        [facility]  [phone]* (retail)  [place], [place]  [address]       Ph: [phone] or [phone]       Fax: [phone]   RxID:   [id number]

## 2010-08-05 NOTE — Progress Notes (Signed)
Summary: Rx refill req  Phone Note Refill Request Message from:  Patient on April 14, 2010 4:47 PM  Refills Requested: Medication #1:  ONETOUCH ULTRA BLUE  STRP use as directed once daily.   Dosage confirmed as above?Dosage Confirmed   Supply Requested: 6 months  Method Requested: Electronic Initial call taken by: Crissie Sickles, CMA,  April 14, 2010 4:49 PM    New/Updated Medications: ONETOUCH ULTRA BLUE  STRP (GLUCOSE BLOOD) use as directed once daily Prescriptions: ONETOUCH ULTRA BLUE  STRP (GLUCOSE BLOOD) use as directed once daily  #100 x 1   Entered by:   Crissie Sickles, CMA   Authorized by:   Rowe Clack MD   Signed by:   Crissie Sickles, CMA on 04/14/2010   Method used:   Electronically to        [facility]  [phone]* (retail)       [address]       [place], [place]  [address]       Ph: [phone] or [phone]       Fax: [phone]   [id number]

## 2010-08-05 NOTE — Miscellaneous (Signed)
Summary: [facility] Cardiac Physician Order/Treatment Plan  [facility] Physician Order/Treatment Plan   Imported By: Sallee Provencal 12/12/2009 15:49:28  _____________________________________________________________________  External Attachment:    Type:   Image     Comment:   External Document

## 2010-08-05 NOTE — Assessment & Plan Note (Signed)
Summary: ROV   Referring Lyrah Bradt:  Elane Fritz MD Primary Lakya Schrupp:  Rowe Clack MD  CC:  no compliants...pt states sometimes he does not take any of his meds for a day or 2 and he feels great but when he starts them back he feels tired.  History of Present Illness: 60 yo with history of CAD and mixed ischemic/nonischemic cardiomyopathy as well as a long history of poorly controlled diabetes presents for followup.  Patient was admitted in 5/11 after several days of shortness of breath, orthopnea, and PND.  He has never had chest pain.  He was found to be volume overloaded with pulmonary edema.  He was diuresed and left/right heart catheterization was done.  Left heart cath showed large, ectatic coronaries with slow flow and moderate to severe diffuse distal vessel disease (consistent with diabetes) without good interventional options.  EF was 15-20% by echo.  Right heart cath after several days of diuresis still showed moderately elevated filling pressures and low cardiac index (1.8) with blood pressure.  Patient was put on milrinone and further diuresed for several more days.  He was titrated off milrinone and discharged on an oral medication regimen.   At baseline, he has difficulty walking due to a prior ulcer on the left foot that healed poorly and left him with a gait abnormality.  Currently, no shortness of breath walking around his house.  He seems to be more limited by orthopedic issues.  No lightheadedness.  He is not particularly active, mainly due to his chronic foot problem.  No PND or orthopnea.  No chest pain.  He is still smoking about 1 pack every 4 days.  Blood glucose is gradually getting under better control.  He never increased his Coreg at last appointment because he does not like the way the medications make him feel.  Today, his heart rhythm is mildly irregular and tachycardic and he is noted to be in atrial flutter.   Labs (5/11, hospital): BNP 1010 => 307, CKMB  peak 7.4, TnI peak 0.14, HgbA1c 11.5, LDL 177, HDL 24, TSH normal Labs (5/11, post-discharge): K 5.0, creatinine 1.4, BNP 308 Labs (5/11): K 4.6, creatinine 1.4, hgbA1c 10.5 Labs (6/11): BNP 288 Labs (8/11): LDL 59, HDL 25, K 4.6 => 5.4, creatinine 1.3, TSH normal  ECG: Atrial flutter at 107, old inf and ant MIs, IVCD  Current Medications (verified): 1)  Px Enteric Aspirin 81 Mg Tbec (Aspirin) .... Take 2 By Mouth Once Daily 2)  Carvedilol 6.25 Mg Tabs (Carvedilol) .... Take One Tablet By Mouth Twice A Day 3)  Plavix 75 Mg Tabs (Clopidogrel Bisulfate) .... Take 1 By Mouth Once Daily 4)  Furosemide 40 Mg Tabs (Furosemide) .... Take 1 By Mouth Once Daily 5)  Glipizide 5 Mg Xr24h-Tab (Glipizide) .Marland Kitchen.. 1 By Mouth Two Times A Day 6)  Lisinopril 5 Mg Tabs (Lisinopril) .... One Twice A Day 7)  Crestor 20 Mg Tabs (Rosuvastatin Calcium) .... Take 1 By Mouth Once Daily 8)  Fish Oil 1000 Mg Caps (Omega-3 Fatty Acids) .... Take 1 By Mouth Once Daily 9)  Multivitamins  Tabs (Multiple Vitamin) .... Take 1 By Mouth Once Daily 10)  B Complex 100  Tabs (B Complex Vitamins) .... Take 1 By Mouth Once Daily 11)  Spironolactone 25 Mg Tabs (Spironolactone) .... Take 1  By Mouth Once Daily  Allergies: 1)  ! Erythromycin  Past History:  Past Medical History: 1. CAD: LHC (5/11) with 50-60% mid LAD, diffuse moderate  distal LAD up to 60-70%, small OM2 subtotally occluded, moderate to severe diffuse distal PLOM disease, severe diffuse distal RCA, PDA, and PLV disease.  Coronaries were ectatic with slow flow.  Patient was started on Plavix given concern for thrombus formation in the ectatic coronaries.   2. CHF: Systolic dysfunction, likely mixed ischemic/nonischemic.  Echo (5/11) with EF 15-20%, severe global hypokinesis, mild MR, moderate diastolic dysfunction, mildly decreased RV systolic function.  RHC (5/11) with mean RA 14 mmHg, PA 38/26, mean PCWP 25 mmHg, CI 1.8.  Repeat echo (8/11) with EF 0000000, severe  diastolic dysfunction, mild MR, RV normal size and systolic function.  Per official read, cannot rule out apical clot but it does appear to be only trabeculations.  3. Type II diabetes, uncontrolled 4. HTN 5. Smoker 6. severe diabetic neuropathy -with chronic gait dysfunction 7. History of left foot ulcer with poor healing   MD roster: cards - [facility] - [facility] - [facility]  Family History: Reviewed history from 11/28/2009 and no changes required. Family History of Colon CA 1st degree relative <60 9other relative) Heart disease (grandparent) Stroke (other relative) dm only in secondary relatives  Social History: Smoker, quit 11/2009 then restarted.  Now about 1/4 ppd.  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   Vital Signs:  Patient profile:   60 year old male Height:      76 inches Weight:      252 pounds BMI:     30.79 Pulse rate:   107 / minute Resp:     14 per minute BP sitting:   115 / 70  (left arm)  Vitals Entered By: Burnett Kanaris (March 28, 2010 9:17 AM)  Physical Exam  General:  Well developed, well nourished, in no acute distress. Neck:  Neck supple, no JVD. No masses, thyromegaly or abnormal cervical nodes. Lungs:  Clear bilaterally to auscultation and percussion. Heart:  Non-displaced PMI, chest non-tender; irregular rate and rhythm, S1, S2 without murmurs, rubs or gallops. Carotid upstroke normal, no bruit. I am unable to palpate his pedal pulses but feet are warm. No edema, no varicosities. Abdomen:  Bowel sounds positive; abdomen soft and non-tender without masses, organomegaly, or hernias noted. No hepatosplenomegaly. Extremities:  No clubbing or cyanosis. Neurologic:  Alert and oriented x 3. Psych:  Normal affect.   Impression & Recommendations:  Problem # 1:  ATRIAL FLUTTER (ICD-427.32) Patient is noted to be newly  in atrial flutter today with rate in the 100s.  He is at considerable CVA risk, so I am going to start him on coumadin (not able to get Pradaxa - not covered).  I am worried that atrial flutter (with elevated rate) will worsen his CHF.  I am going to arrange for him to get a cardioversion next Friday.  This will need to be TEE-directed.  If INR is subtherapeutic, he can get Lovenox bridging.  As below, will increase Coreg. If atrial flutter returns, will look into getting it ablated. This is his first known episode.   Problem # 2:  CORONARY ARTERY DISEASE (ICD-414.00) Diffuse moderate to severe distal vessel disease on cath with ectatic coronaries with slow flow.  No interventional target.  He has never had chest pain.  As I am starting coumadin, will stop Plavix and decrease ASA to 81 mg daily.  He will  continue statin, ACEI, and beta blocker.  LDL was at goal (<70) when assessed in 8/11.  Problem # 3:  CONGESTIVE HEART FAILURE (99991111) Systolic CHF.  This may be multifactorial, due to significant distal vessel CAD as well as a history of poorly controlled diabetes.  He has NYHA class II-III symptoms.  He does not appear significantly volume overloaded and weight is actually down 2 lbs since last appointment. - Increase Coreg to 9.375 mg two times a day for 3 days, then to 12.5 mg two times a day.  - Continue current doses of lisinopril and spironolactone.  K was mildly elevated.  He is going to cut potassium-rich foods out of his diet and we will reassess K in 1 week.  If still high, will cut spironolactone in half.  - EF remains low despite meds.  Will arrange evaluation by EP for ICD.  - Continue current dose of Lasix.   Problem # 4:  SMOKER (ICD-305.1) I strongly encouraged him to quit.  I gave him a prescription for wellbutrin today.   Other Orders: EP Referral (Cardiology EP Ref ) TLB-BMP (Basic Metabolic Panel-BMET) (99991111) TLB-CBC Platelet - w/Differential (85025-CBCD) TLB-PT  (Protime) (85610-PTP)  Patient Instructions: 1)  Your physician has recommended you make the following change in your medication:  2)  Stop Plavix today. 3)  Start Warfarin(coumadin)---take as directed. 4)  Increase coreg(caredilol) to 9.375mg  twice a day for 3 days--this will be one and one-half of a 6.25mg  tablet twice a day for 3 days. After 3 days increase coreg(carvedilol) to 12.5mg  twice a day. This will be the dose you will stay on for right now. 5)  Decrease Aspirin to 81mg  daily. 6)  Start Welbutrin to help you stop smoking. This will be Welbutrin 150mg  daily for 3 days then 150mg  twice a day. You can take this for a total of 12 weeks. 7)  You have been referred to EP for ICD evaluation. 8)  Lab today--BMP/CBC/PT--427.32 9)  Lab on Friday September 30 at 8:30am for a PT.--This will be at the [facility] office before you go to the hospital at 9:30. 10)  Your physician recommends that you schedule a follow-up appointment in: 1 month with Dr Aundra Dubin. Prescriptions: WARFARIN SODIUM 5 MG TABS (WARFARIN SODIUM) one daily or as directed  #30 x 0   Entered by:   Desiree Lucy, RN, BSN   Authorized by:   [name], MD   Signed by:   Desiree Lucy, RN, BSN on 03/28/2010   Method used:   Electronically to        [facility]  [phone]* (retail)       [address]       [place], [place]  [address]       Ph: [phone] or [phone]       Fax: [phone]   RxID:   [id number] WELLBUTRIN SR 150 MG XR12H-TAB (BUPROPION HCL) one daily for 3 days then one twice a day  #60 x 2   Entered by:   Desiree Lucy, RN, BSN   Authorized by:   [name], MD   Signed by:   Desiree Lucy, RN, BSN on 03/28/2010   Method used:   Electronically to        [facility]  [phone]* (retail)       [address]  [place], [place]  [address]       Ph: [phone] or [phone]       Fax: [phone]   RxID:    [id number] COREG 12.5 MG TABS (CARVEDILOL) one twice a day  #60 x 6   Entered by:   Desiree Lucy, RN, BSN   Authorized by:   [name], MD   Signed by:   Desiree Lucy, RN, BSN on 03/28/2010   Method used:   Electronically to        [facility]  [phone]* (retail)       [address]       [place], [place]  [address]       Ph: [phone] or [phone]       Fax: [phone]   RxID:   [id number]

## 2010-08-05 NOTE — Progress Notes (Signed)
Summary: neurosurg consult  ---- Converted from flag ---- ---- 12/09/2009 4:06 PM, Crissie Sickles, CMA wrote: Order for Nuerosurgery referral not in EMR. Can we add? Thx! ------------------------------  yes, done. thx! Rowe Clack MD  December 12, 2009 9:01 AM  Phone Note Outgoing Call

## 2010-08-05 NOTE — Assessment & Plan Note (Signed)
Summary: NEW/MEDICARE/#/ Anthony Rubio REFERRED PT/CD   Vital Signs:  Patient profile:   60 year old male Height:      76.5 inches (194.31 cm) Weight:      253.12 pounds (115.05 kg) BMI:     30.52 O2 Sat:      95 % on Room air Temp:     99.1 degrees F (37.28 degrees C) oral Pulse rate:   82 / minute BP sitting:   110 / 72  (left arm) Cuff size:   large  Vitals Entered By: Tomma Lightning (Nov 22, 2009 8:10 AM)  O2 Flow:  Room air CC: New patient Is Patient Diabetic? Yes Did you bring your meter with you today? No Pain Assessment Patient in pain? no        Primary Care Provider:  Rowe Clack MD  CC:  New patient.  History of Present Illness: new pt to me and our division, here to est care -  1) DM2, uncontrolled - does not check sugars regularly because has no meter or stips - waiting on supplies from [place] - deneis symptoms low sugar - no sweating - denies PU/PD -reviewed med changes in hosp - has never been in insulin tx  2) dyslipidemia - reports compliance with ongoing medical treatment and no changes in medication dose or frequency. denies adverse side effects related to current therapy. no GI or muscle complaints  3) CAD/CHF - recent hosp for acue exac of same - [facility]5/7 [facility] - reports compliance with ongoing medical treatment and no changes in medication dose or frequency. denies adverse side effects related to current therapy. no leg swelling or SOB  4) gait d/o - ongoing for 2-3 years - relates change to wearing lift off cast on left foot for wound healing in 2007-8 - now poor dorsiflex L>R foot - no falls, no use of walk asst aide - never been in PT - feels "like i walk drunk" but not dizzy or numb -   Preventive Screening-Counseling & Management  Alcohol-Tobacco     Alcohol drinks/day: 0     Alcohol Counseling: not indicated; patient does not drink     Smoking Status: quit     Tobacco Counseling: not to resume use of tobacco products  Caffeine-Diet-Exercise   Diet Counseling: to improve diet; diet is suboptimal     Does Patient Exercise: no     Depression Counseling: not indicated; screening negative for depression  Safety-Violence-Falls     Seat Belt Use: yes     Seat Belt Counseling: not indicated; patient wears seat belts     Helmet Use: n/a     Helmet Counseling: not applicable     Firearms in the Home: no firearms in the home     Firearm Counseling: not applicable     Smoke Detectors: yes     Smoke Detector Counseling: n/a     Violence Counseling: not indicated; no violence risk noted     Fall Risk Counseling: counseling provided; falls with injury noted  Clinical Review Panels:  Immunizations   Last Pneumovax:  Historical given @ [facility] (11/06/2009)  Lipid Management   Cholesterol:  232 (11/06/2009)   LDL (bad choesterol):  177 (11/06/2009)   HDL (good cholesterol):  24 (11/06/2009)   Triglycerides:  157 (11/06/2009)  Diabetes Management   HgBA1C:  11.5 (11/05/2009)   Creatinine:  1.4 (11/14/2009)   Last Foot Exam:  yes (11/22/2009)   Last Pneumovax:  Historical given @ [facility] (  11/06/2009)  CBC   WBC:  7.0 (11/07/2009)   RBC:  4.59 (11/07/2009)   Hgb:  13.2 (11/07/2009)   Hct:  13.2 (11/07/2009)   Platelets:  264 (11/07/2009)   MCV  86.8 (11/07/2009)   RDW  12.7 (11/06/2009)   PMN:  63 (11/06/2009)   Monos:  7 (11/06/2009)   Eosinophils:  1 (11/06/2009)   Basophil:  3 (11/06/2009)  Complete Metabolic Panel   Glucose:  170 (11/14/2009)   Sodium:  140 (11/14/2009)   Potassium:  5.0 (11/14/2009)   Chloride:  106 (11/14/2009)   CO2:  27 (11/14/2009)   BUN:  39 (11/14/2009)   Creatinine:  1.4 (11/14/2009)   Calcium:  9.3 (11/14/2009)   Current Medications (verified): 1)  Px Enteric Aspirin 81 Mg Tbec (Aspirin) .... Take 2 By Mouth Once Daily 2)  Carvedilol 3.125 Mg Tabs (Carvedilol) .... Take 1 Two Times A Day 3)  Plavix 75 Mg Tabs (Clopidogrel Bisulfate) .... Take 1 By Mouth Once Daily 4)  Furosemide 40 Mg Tabs  (Furosemide) .... Take 1 By Mouth Once Daily 5)  Glipizide Xl 2.5 Mg Xr24h-Tab (Glipizide) .... Take 1 By Mouth Two Times A Day 6)  Lisinopril 2.5 Mg Tabs (Lisinopril) .... Take 1 By Mouth Two Times A Day 7)  Klor-Con 10 10 Meq Cr-Tabs (Potassium Chloride) .... Take 1 By Mouth Once Daily 8)  Crestor 20 Mg Tabs (Rosuvastatin Calcium) .... Take 1 By Mouth Once Daily 9)  Fish Oil 1000 Mg Caps (Omega-3 Fatty Acids) .... Take 1 By Mouth Once Daily 10)  Multivitamins  Tabs (Multiple Vitamin) .... Take 1 By Mouth Once Daily 11)  B Complex 100  Tabs (B Complex Vitamins) .... Take 1 By Mouth Once Daily 12)  Zinc 100 Mg Tabs (Zinc) .... Take 1 By Mouth Once Daily 13)  Spironolactone 25 Mg Tabs (Spironolactone) .... Take 1 1/2 By Mouth Once Daily  Allergies (verified): 1)  ! 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 I have reviewed all other systems and they were negative.   Physical Exam  General:  alert, well-developed, well-nourished, and cooperative to examination.   wife at side Eyes:  vision grossly intact; pupils equal, round and reactive to light.  conjunctiva and lids normal.    Ears:  normal pinnae bilaterally, without erythema, swelling, or tenderness to palpation. TMs clear, without effusion, or cerumen  impaction. Hearing grossly normal bilaterally  Mouth:  teeth and gums in good repair; mucous membranes moist, without lesions or ulcers. oropharynx clear without exudate, no erythema.  Lungs:  normal respiratory effort, no intercostal retractions or use of accessory muscles; normal breath sounds bilaterally - no crackles and no wheezes.    Heart:  normal rate, regular rhythm, no murmur, and no rub. BLE without edema. normal DP pulses and normal cap refill  in all 4 extremities    Abdomen:  soft, non-tender, normal bowel sounds, no distention; no masses and no appreciable hepatomegaly or splenomegaly.   Msk:  no gross deformities - Neurologic:  high stepping myelopathic gait - poor balance - alert & oriented X3 and cranial nerves II-XII symetrically intact.  strength normal in all extremities, sensation intact to light touch. speech fluent without dysarthria or aphasia; follows commands with good comprehension.  Skin:  no rashes, vesicles, ulcers, or erythema. No nodules or irregularity to palpation.  Psych:  Oriented X3, memory intact for recent and remote, normally interactive, good eye contact, not anxious appearing, not depressed appearing, and not agitated.     Diabetes Management Exam:    Foot Exam (with socks and/or shoes not present):       Inspection:          Left foot: abnormal             Comments: calus 2 cm under 1st MT head, no ulceration           Right foot: normal    Foot Exam by Podiatrist:       Results: moderate diabetic findings       Done by: Brayton El ([facility])   Impression & Recommendations:  Problem # 1:  CERVICAL SPONDYLOSIS WITH MYELOPATHY (ICD-721.1) dx suspected based on symptoms and exam - check xray and MRI c-spine now - neuropathy from DM also in DDx but seems less likely based on constellation of symptoms and timecourse Orders: T-Cervical Spine Comp 4 Views [phone]) Radiology Referral (Radiology)  Problem # 2:  DIABETES MELLITUS, TYPE II  (ICD-250.00)  increase med therapy - double OHA dose-  may need insulin - refer to endo  needs to monitor home cbgs - meter/strips provided cnt podiatry foot exams and care -  reminded of need for optho eval Time spent with patient/wife 45 minutes, more than 50% of this time was spent counseling patient on diabetes, medications, recent hosp ad need to further eval myelopathic gait His updated medication list for this problem includes:    Px Enteric Aspirin 81 Mg Tbec (Aspirin) .Marland Kitchen... Take 2 by mouth once daily    Glipizide 5 Mg Xr24h-tab (Glipizide) .Marland Kitchen... 1 by mouth two times a day    Lisinopril 2.5 Mg Tabs (Lisinopril) .Marland Kitchen... Take 1 by mouth two times a day  Orders: Endocrinology Referral (Endocrine) Prescription Created Electronically [phone])  Labs Reviewed: Creat: 1.4 (11/14/2009)    Reviewed HgBA1c results: 11.5 (11/05/2009)  Problem # 3:  HYPERLIPIDEMIA (ICD-272.4)  His updated medication list for this problem includes:    Crestor 20 Mg Tabs (Rosuvastatin calcium) .Marland Kitchen... Take 1 by mouth once daily    HDL:24 (11/06/2009)  LDL:177 (11/06/2009)  Chol:232 (11/06/2009)  Trig:157 (11/06/2009)  Problem # 4:  GAIT DISTURBANCE (ICD-781.2) see # 1 above -  addendum - xray with severe spondylosis c5-6, c6-7 with posterios spurring - proceed with MRI as ordered to look for cervical stenosis delay refer to PT until cause of myelopathic gait further explored  Orders: Radiology Referral (Radiology)  Complete Medication List: 1)  Px Enteric Aspirin 81 Mg Tbec (Aspirin) .... Take 2 by mouth once daily 2)  Carvedilol 3.125 Mg Tabs (Carvedilol) .... Take 1 two times a day 3)  Plavix 75 Mg Tabs (Clopidogrel bisulfate) .... Take 1 by mouth once daily 4)  Furosemide 40 Mg Tabs (Furosemide) .... Take 1 by mouth once daily 5)  Glipizide 5 Mg Xr24h-tab (Glipizide) .Marland KitchenMarland KitchenMarland Kitchen  1 by mouth two times a day 6)  Lisinopril 2.5 Mg Tabs (Lisinopril) .... Take 1 by mouth two times a day 7)  Klor-con 10 10 Meq  Cr-tabs (Potassium chloride) .... Take 1 by mouth once daily 8)  Crestor 20 Mg Tabs (Rosuvastatin calcium) .... Take 1 by mouth once daily 9)  Fish Oil 1000 Mg Caps (Omega-3 fatty acids) .... Take 1 by mouth once daily 10)  Multivitamins Tabs (Multiple vitamin) .... Take 1 by mouth once daily 11)  B Complex 100 Tabs (B complex vitamins) .... Take 1 by mouth once daily 12)  Zinc 100 Mg Tabs (Zinc) .... Take 1 by mouth once daily 13)  Spironolactone 25 Mg Tabs (Spironolactone) .... Take 1 1/2 by mouth once daily  Patient Instructions: 1)  it was good to see you today. 2)  your medications and recent hospitalization reviewed today - 3)  increase Glipizide to 5mg  two times a day - your prescription has been electronically submitted to your pharmacy. Please take as directed. Contact our office if you believe you're having problems with the medication(s).  4)  new glucometer and strips provided to bridge you until new supplies from [place] arrive - check sugars2x/day and call if over 200 or less than 70 5)  we'll make referral to endocrinology - Dr. Loanne Drilling, diabetes specialist. Our office will contact you regarding this appointment once made.  6)  will check neck xray today and plan MRI neck to look for arthritis problems that may be contributing to your walking problem - we may need to have you seen by neurosurgery depending on these results - 7)  will hold on physical therapy referral until we have these results - 8)  Please schedule a follow-up appointment in 6 weeks to review medications and symptoms, sooner if problems.  9)  See your eye doctor yearly to check for diabetic eye damage. 10)  Check your feet each night for sore areas, calluses or signs of infection. Prescriptions: GLIPIZIDE 5 MG XR24H-TAB (GLIPIZIDE) 1 by mouth two times a day  #60 x 2   Entered and Authorized by:   Rowe Clack MD   Signed by:   Rowe Clack MD on 11/22/2009   Method used:   Electronically to         [facility]  [phone]* (retail)       [address]       [place], [place]  [address]       Ph: [phone] or [phone]       Fax: [phone]   RxID:   [id number]    Immunization History:  Pneumovax Immunization History:    Pneumovax:  historical given @ [facility] (11/06/2009)

## 2010-08-05 NOTE — Assessment & Plan Note (Signed)
Summary: to discuss ICD/saf   Visit Type:  Initial Consult Referring Provider:  Dr Aundra Dubin Primary Provider:  Rowe Clack MD   History of Present Illness: 60 yo AAM with history of CAD and mixed ischemic/nonischemic cardiomyopathy (EF 15%), NYHA Class III CHF, and diabetes who presents for EP consultation.  He initially presented 5/11 with acute decompensated CHF after several days of shortness of breath, orthopnea, and PND.   He was diuresed and left/right heart catheterization was done.  Left heart cath showed large, ectatic coronaries with slow flow and moderate to severe diffuse distal vessel disease (consistent with diabetes) without good interventional options.  EF was 15-20% by echo.  He was put on milrinone and further diuresed for several more days.  He was titrated off milrinone and discharged on an oral medication regimen.  SInce that time, he has been treated with an optimal medical therapy.  He recently developed atrial flutter requiring cardioversion.  At baseline, he has difficulty walking due to a prior ulcer on the left foot that healed poorly and left him with a gait abnormality.  Currently, no shortness of breath walking around his house.  He seems to be more limited by orthopedic issues.  No lightheadedness.  He is not particularly active, mainly due to his chronic foot problem.  No PND or orthopnea.  No chest pain.  He is still smoking about 1 pack every 4 days.  Blood glucose is gradually getting under better control.   Current Medications (verified): 1)  Px Enteric Aspirin 81 Mg Tbec (Aspirin) .... Take One Daily 2)  Coreg 12.5 Mg Tabs (Carvedilol) .... One Twice A Day 3)  Furosemide 40 Mg Tabs (Furosemide) .... Take 1 By Mouth Once Daily 4)  Glipizide 5 Mg Xr24h-Tab (Glipizide) .Marland Kitchen.. 1 By Mouth Two Times A Day 5)  Lisinopril 5 Mg Tabs (Lisinopril) .... One Twice A Day 6)  Crestor 20 Mg Tabs (Rosuvastatin Calcium) .... Take 1 By Mouth Once Daily 7)  Fish Oil 1000 Mg  Caps (Omega-3 Fatty Acids) .... Take 1 By Mouth Once Daily 8)  Multivitamins  Tabs (Multiple Vitamin) .... Take 1 By Mouth Once Daily 9)  B Complex 100  Tabs (B Complex Vitamins) .... Take 1 By Mouth Once Daily 10)  Spironolactone 25 Mg Tabs (Spironolactone) .... Take 1  By Mouth Once Daily 11)  Warfarin Sodium 5 Mg Tabs (Warfarin Sodium) .... One Daily or As Directed  Allergies: 1)  ! Erythromycin  Past History:  Past Medical History: 1. CAD: LHC (5/11) with 50-60% mid LAD, diffuse moderate distal LAD up to 60-70%, small OM2 subtotally occluded, moderate to severe diffuse distal PLOM disease, severe diffuse distal RCA, PDA, and PLV disease.  Coronaries were ectatic with slow flow.  Patient was started on Plavix given concern for thrombus formation in the ectatic coronaries.   2. CHF: Systolic dysfunction, likely mixed ischemic/nonischemic.  Echo (5/11) with EF 15-20%, severe global hypokinesis, mild MR, moderate diastolic dysfunction, mildly decreased RV systolic function.  RHC (5/11) with mean RA 14 mmHg, PA 38/26, mean PCWP 25 mmHg, CI 1.8.  Repeat echo (8/11) with EF 0000000, severe diastolic dysfunction, mild MR, RV normal size and systolic function.  Per official read, cannot rule out apical clot but it does appear to be only trabeculations.  3. Type II diabetes, uncontrolled 4. HTN 5. Smoker 6. severe diabetic neuropathy -with chronic gait dysfunction 7. History of left foot ulcer with poor healing  8. Atrial flutter s/p [facility] 9/11  Past Surgical History: none  Family History: Reviewed history from 11/28/2009 and no changes required. Family History of Colon CA 1st degree relative <60 9other relative) Heart disease (grandparent) Stroke (other relative) dm only in secondary relatives  Social History: Pt lives in [place].  Smoker, about 1/4 ppd.  no alcohol Former [employer] married, lives with wife  Retired Barrister's clerk    Review of Systems       All systems are  reviewed and negative except as listed in the HPI.   Vital Signs:  Patient profile:   60 year old male Height:      76 inches Weight:      250 pounds BMI:     30.54 Pulse rate:   64 / minute BP sitting:   100 / 70  (left arm)  Vitals Entered By: Margaretmary Bayley CMA (April 09, 2010 12:28 PM)  Physical Exam  General:  chronically ill, NAD in a wheelchair today Head:  normocephalic and atraumatic Eyes:  PERRLA/EOM intact; conjunctiva and lids normal. Mouth:  Teeth, gums and palate normal. Oral mucosa normal. Neck:  supple Lungs:  Clear bilaterally to auscultation and percussion. Heart:  RRR, no m/r/g, laterally displaced PMI Abdomen:  Bowel sounds positive; abdomen soft and non-tender without masses, organomegaly, or hernias noted. No hepatosplenomegaly. Msk:  Back normal, normal gait. Muscle strength and tone normal. Pulses:  pulses normal in all 4 extremities Extremities:  No clubbing or cyanosis. Neurologic:  Alert and oriented x 3. Skin:  Intact without lesions or rashes. Cervical Nodes:  no significant adenopathy Psych:  Normal affect.   Cardiac Cath  Procedure date:  11/07/2009  Findings:      IMPRESSION:  This is a 60 year old with new-onset cardiomyopathy who is   found to have diffuse severe distal and branch vessel disease in his   coronaries.  It is conceivable that this is the cause of his   cardiomyopathy as it does appear that he has very severe distal vessel   disease.  He does have ectatic proximal coronaries with slow flow though   there are no flow-limiting lesions in the proximal to mid coronaries.   The patient does have elevated left and right heart filling pressures   with a cardiac index of 1.8.  He has known EF of about 15%.  I would   plan on continuing aspirin and adding on Plavix as I would like to   maximize his antiplatelet treatment given the appearance of the ectatic   coronaries with slow flow.  I think there would be a risk of thrombus    forming in his proximal coronaries.  The patient does need further   diuresis.  We will plan on using milrinone drip given his lowish   systolic blood pressure which is 96/65 here in the cath lab.  We will   start him on milrinone 0.25 mcg/kg/minute as well as Lasix drip at 7.5   mg an hour.               [name], MD      CXR  Procedure date:  11/04/2009  Findings:       Findings: The heart is mildly enlarged.  There are abnormal   interstitial markings which to some degree are chronic.  However,   they look more prominent currently.  There are also small pleural   effusions.  No definite pneumonia.    I think there is probably an element of congestive heart  failure   superimposed upon chronic interstitial lung disease.    IMPRESSION:   Suspect mild congestive heart failure superimposed upon chronic   interstitial lung disease.    Read By:  Juanita Laster,  M.D.  Echocardiogram  Procedure date:  11/05/2009  Findings:        Study Conclusions    - Left ventricle: The cavity size was moderately dilated. Wall     thickness was increased in a pattern of mild LVH. Systolic     function was severely reduced. The estimated ejection fraction was     in the range of 15% to 20%. Severeglobal hypokinesis. Features are     consistent with a pseudonormal left ventricular filling pattern,     with concomitant abnormal relaxation and increased filling     pressure (grade 2 diastolic dysfunction).   - Aortic valve: Trileaflet; moderately calcified leaflets. Sclerosis     without stenosis. Trivial regurgitation.   - Aorta: Mildly dilated aortic root.   - Mitral valve: Mild regurgitation.   - Left atrium: The atrium was moderately dilated.   - Right ventricle: The cavity size was normal. Systolic function was     mildly reduced.   - Pulmonary arteries: No TR doppler jet was measured so unable to     estimate PA systolic pressure.   - Systemic veins: The IVC was not  visualized.   Impressions:    - Moderately dilated LV with severe systolic dysfunction, EF 0000000.     There was global severe hypokinesis. Moderate diastolic     dysfunction. Mild mitral regurgitation. Aortic sclerosis but no     significant stenosis by gradient. The RV appears normal in size     with mild hypokinesis.   Transthoracic echocardiography. M-mode, complete 2D, spectral   Doppler, and color Doppler. Blood pressure: 133/88. Patient status:   Inpatient. Location: Bedside.      Echocardiogram  Procedure date:  03/03/2010  Findings:       Study Conclusions    - Left ventricle: The EF is 15-20%. I can not rule out some layered     clot. The cavity size was severely dilated. Diffuse hypokinesis.     Doppler parameters are consistent with high ventricular filling     pressure.   - Aortic valve: Sclerosis without stenosis. Mild regurgitation.   - Aorta: Aortic root slightly dilated.   - Mitral valve: Mild regurgitation.   - Left atrium: The atrium was moderately dilated.   - Right ventricle: The cavity size was normal. Systolic function was     mildly reduced.   Transthoracic echocardiography. M-mode, complete 2D, spectral   Doppler, and color Doppler. Height: Height: 193cm. Height: 76in.   Weight: Weight: 115.2kg. Weight: 253.5lb. Body mass index: BMI:   30.9kg/m^2. Body surface area: BSA: 2.70m^2. Blood pressure: 112/73.   Patient status: Outpatient. Location: [facility]     Cardiac Cath  Procedure date:  04/04/2010  Findings:       INDICATIONS:  This is a 60 year old with a history of systolic heart   failure and a dilated cardiomyopathy who saw me in clinic last week and   was noted to be in atrial flutter with a rate in the 100s.  This was new   for him.  At that time, we started him on Coumadin and stopped Plavix.   INR today was 1.8.  He did receive a 100 mg of subcutaneous Lovenox   about 2 hours prior to  the procedure.  The patient underwent    transesophageal echocardiogram showing no left atrial appendage thrombus   and no left ventricular apical thrombus.  He was then sedated by   Anesthesiology using propofol.  He underwent direct current   cardioversion at 150 joules with a biphasic waveform.  He converted to   normal sinus rhythm on the first shock and no complications.  The   patient will need to go on Lovenox until his INR is therapeutic which   would be greater than 2.  We will arrange for him to get Lovenox in the   office.               [name], MD   TE Echocardiogram  Procedure date:  04/04/2010  Findings:        Study Conclusions    - Left ventricle: Dilated. No LV apical thrombus noted. The     estimated ejection fraction was 15%. Diffuse hypokinesis.   - Aortic valve: Trileaflet; moderately calcified leaflets. There was     no stenosis. Mild regurgitation.   - Aorta: Normal caliber with mild plaque in the descending thoracic     aorta.   - Mitral valve: Mild to moderate regurgitation.   - Left atrium: The atrium was moderately dilated. No evidence of     thrombus in the atrial cavity or appendage.   - Right ventricle: The cavity size was normal. Systolic function was     moderately reduced.   - Right atrium: The atrium was mildly dilated. Chiari network noted.   - Atrial septum: No defect or patent foramen ovale was identified.     Echo contrast study showed no right-to-left atrial level shunt, at     baseline or with provocation.   Impressions:    - OK to proceed with DCCV   Transesophageal echocardiography. 2D and color Doppler. Blood   pressure: 137/72. Patient status: Outpatient. Location: Endoscopy.    Prepared and Electronically Authenticated by    [name], MD       EKG  Procedure date:  11/27/2009  Findings:      sinus rhythm 74 bpm, PR 198, QRS 108, Qtc 455 anterolateral and inferior scar  EKG  Procedure date:  03/28/2010  Findings:      typical appearing  atrial flutter V rate 110 bpm, inferior and anterolateral scar  Impression & Recommendations:  Problem # 1:  CHRONIC SYSTOLIC HEART FAILURE (123456) The patient has a mixed nonischemi and ischemic CM (EF 15%), NYHA Class II/III CHF, and CAD.  He meets MADIT II/ SCD-HeFT criteria for ICD implantation for primary prevention of sudden death.  Risks, benefits, alternatives to ICD implantation were discussed in detail with the patient today.  He understands that the risks include but are not limited to bleeding, infection, pneumothorax, perforation, tamponade, vascular damage, renal failure, MI, stroke, death, inappropriate shocks, and lead dislodgement.   We will therefore schedule ICD implantation at the next available time.  Given his h/o atrial arrhythmias, I would plan to place an atrial lead at time of implantation.  Problem # 2:  ATRIAL FLUTTER (ICD-427.32) he has typical atrial flutter documented by EKG and was cardioverted. I would recommend that we continue longterm anticoagulation with coumadin. I would defer catheter ablation unless he has recurrence of atrial flutter.  Problem # 3:  CORONARY ATHEROSCLEROSIS NATIVE CORONARY ARTERY (ICD-414.01) no symptoms of ischemia continue current medical therapy  Problem # 4:  TOBACCO USE, QUIT (ICD-V15.82)  he  has resumed tobacco I would very clear in my recommendation that he quit

## 2010-08-05 NOTE — Assessment & Plan Note (Signed)
Summary: p hosp   Visit Type:  Follow-up Primary Provider:  Rowe Clack MD   History of Present Illness: 60 yo with history of CAD and mixed ischemic/nonischemic cardiomyopathy as well as poorly controlled diabetes presents to establish cardiology care.  Patient was admitted in 5/11 after several days of shortness of breath, orthopnea, and PND.  He has never had chest pain.  He was found to be volume overloaded with pulmonary edema.  He was diuresed and left/right heart catheterization was done.  Left heart cath showed large, ectatic coronaries with slow flow and moderate to severe diffuse distal vessel disease (consistent with diabetes) without good interventional options.  EF was 15-20% by echo.  Right heart cath after several days of diuresis still showed moderately elevated filling pressures and low cardiac index (1.8) with blood pressure.  Patient was put on milrinone and further diuresed for several more days.  He was titrated off milrinone and discharged on an oral medication regimen.   Since discharge, patient reports that he has felt "groggy" and attributes this to all his new medications.  His systolic BP today is [id number] and was 119 when he saw Dr Asa Lente.  At baseline, he has difficulty walking due to a prior ulcer on the left foot that healed poorly and left him with a gait abnormality.  He is mildly short of breath walking around his house or going up a flight of steps.  He is not particularly active, mainly due to his chronic foot problem.  No PND or orthopnea.  No chest pain.  He has quit smoking.    Labs (5/11, hospital): BNP 1010 => 307, CKMB peak 7.4, TnI peak 0.14, HgbA1c 11.5, LDL 177, HDL 24, TSH normal Labs (5/11, post-discharge): K 5.0, creatinine 1.4, BNP 308  ECG: NSR, old inferior MI, old anterior MI, borderline IVCD (QRS 108 msec)  Current Medications (verified): 1)  Px Enteric Aspirin 81 Mg Tbec (Aspirin) .... Take 2 By Mouth Once Daily 2)  Carvedilol 3.125 Mg Tabs  (Carvedilol) .... Take 1 Two Times A Day 3)  Plavix 75 Mg Tabs (Clopidogrel Bisulfate) .... Take 1 By Mouth Once Daily 4)  Furosemide 40 Mg Tabs (Furosemide) .... Take 1 By Mouth Once Daily 5)  Glipizide 5 Mg Xr24h-Tab (Glipizide) .Marland Kitchen.. 1 By Mouth Two Times A Day 6)  Lisinopril 2.5 Mg Tabs (Lisinopril) .... Take 1 By Mouth Two Times A Day 7)  Klor-Con 10 10 Meq Cr-Tabs (Potassium Chloride) .... Take 1 By Mouth Once Daily 8)  Crestor 20 Mg Tabs (Rosuvastatin Calcium) .... Take 1 By Mouth Once Daily 9)  Fish Oil 1000 Mg Caps (Omega-3 Fatty Acids) .... Take 1 By Mouth Once Daily 10)  Multivitamins  Tabs (Multiple Vitamin) .... Take 1 By Mouth Once Daily 11)  B Complex 100  Tabs (B Complex Vitamins) .... Take 1 By Mouth Once Daily 12)  Zinc 100 Mg Tabs (Zinc) .... Take 1 By Mouth Once Daily 13)  Spironolactone 25 Mg Tabs (Spironolactone) .... Take 1 1/2 By Mouth Once Daily  Allergies: 1)  ! Erythromycin  Past History:  Past Medical History: 1. CAD: LHC (5/11) with 50-60% mid LAD, diffuse moderate distal LAD up to 60-70%, small OM2 subtotally occluded, moderate to severe diffuse distal PLOM disease, severe diffuse distal RCA, PDA, and PLV disease.  Coronaries were ectatic with slow flow.  Patient was started on Plavix given concern for thrombus formation in the ectatic coronaries.   2. CHF: Systolic dysfunction, likely mixed  ischemic/nonischemic.  Echo (5/11) with EF 15-20%, severe global hypokinesis, mild MR, moderate diastolic dysfunction, mildly decreased RV systolic function.  RHC (5/11) with mean RA 14 mmHg, PA 38/26, mean PCWP 25 mmHg, CI 1.8.  3. Type II diabetes: poor control when admitted in 5/11.  4. HTN 5. Prior smoker 6. History of left foot ulcer with poor healing (took 2 years) resulting in chronic gait dysfunction.   Family History: Reviewed history from 11/22/2009 and no changes required. Family History of Colon CA 1st degree relative <60 9other relative) Heart disease  (grandparent) Stroke (other relative)  Social History: Former Smoker, quit 11/2009 - 30+ pkyr hx no alcohol Former [employer] married, lives with wife  Retired Barrister's clerk    Review of Systems       All systems reviewed and negative except as per HPI.   Vital Signs:  Patient profile:   60 year old male Height:      76.5 inches Weight:      254 pounds BMI:     30.63 Pulse rate:   74 / minute BP sitting:   130 / 86  (left arm)  Vitals Entered By: Margaretmary Bayley CMA (Nov 27, 2009 8:44 AM)  Physical Exam  General:  Well developed, well nourished, in no acute distress. Head:  normocephalic and atraumatic Nose:  no deformity, discharge, inflammation, or lesions Mouth:  Teeth, gums and palate normal. Oral mucosa normal. Neck:  Neck supple, no JVD. No masses, thyromegaly or abnormal cervical nodes. Lungs:  Clear bilaterally to auscultation and percussion. Heart:  Non-displaced PMI, chest non-tender; regular rate and rhythm, S1, S2 without murmurs, rubs or gallops. Carotid upstroke normal, no bruit. 1+ PT pulses bilaterally. Trace ankle edema.  Abdomen:  Bowel sounds positive; abdomen soft and non-tender without masses, organomegaly, or hernias noted. No hepatosplenomegaly. Extremities:  No clubbing or cyanosis. Neurologic:  Alert and oriented x 3. Skin:  Intact without lesions or rashes. Psych:  Normal affect.   Impression & Recommendations:  Problem # 1:  CORONARY ARTERY DISEASE (ICD-414.00) Diffuse moderate to severe distal vessel disease on cath with ectatic coronaries with slow flow.  No interventional target.  Should continue ASA and Plavix (Plavix given slow flow with ectatic coronaries and concern for risk of thrombus formation).  He will also continue statin, ACEI, and beta blocker.  He has never had chest pain.  - Will refer to cardiac rehab.   Problem # 2:  CONGESTIVE HEART FAILURE (99991111) Systolic CHF.  This may be multifactorial, due to significant distal vessel  CAD as well as poorly controlled diabetes.  He has NYHA class III symptoms.  He does not appear significantly volume overloaded.  He reports some generalized "grogginess."  His BP has not been documented to be running low since he left the hospital.  He required milrinone in the hospital with cardiac index of 1.8.  He may eventually require more advanced therapy (would need much better diabetes control before that time).   - Increase Coreg to 6.25 mg two times a day - Borderline elevated potassium so will decrease spironolactone to 25 mg daily and will stop KCl 10 mEq daily.  - Continue current dose of lisinopril and Lasix.   - BMET/BNP in 1 week, then followup in 2 weeks for med titration.   Problem # 3:  TOBACCO USE, QUIT (ICD-V15.82) Patient was congratulated on quitting smoking.  It is imperative that he stay off tobacco.   Problem # 4:  HYPERLIPIDEMIA (ICD-272.4) Goal  LDL < 70.  He is on Crestor 20 mg daily.  Will need lipids/LFTs in 7/11.   Patient Instructions: 1)  Your physician has recommended you make the following change in your medication:  2)  Stop KCL(potasium) 3)  Decrease Spironolactone to 25mg  daily 4)  Increase Coreg to 6.25mg  twice a day--you can take two 3.125mg  tablets twice a day 5)  Your physician recommends that you return for lab work in: 1 week BMP 414.00 428.0  6)  Your physician recommends that you schedule a follow-up appointment in: 10 days  with Dr Aundra Dubin. 7)  Your physician recommends referral and attendance at a Cardiac Rehab Program. Prescriptions: SPIRONOLACTONE 25 MG TABS (SPIRONOLACTONE) take 1  by mouth once daily  #30 x 11   Entered by:   Desiree Lucy, RN, BSN   Authorized by:   [name], MD   Signed by:   Desiree Lucy, RN, BSN on 11/27/2009   Method used:   Electronically to        [facility]  [phone]* (retail)       [address]       [place], [place]  [address]       Ph: [phone] or [phone]        Fax: [phone]   RxID:   [id number] COREG 6.25 MG TABS (CARVEDILOL) one tablet twice a day  #60 x 11   Entered by:   Desiree Lucy, RN, BSN   Authorized by:   [name], MD   Signed by:   Desiree Lucy, RN, BSN on 11/27/2009   Method used:   Electronically to        [facility]  [phone]* (retail)       [address]       [place], [place]  [address]       Ph: [phone] or [phone]       Fax: [phone]   RxID:   [id number]    Appended Document: p hosp    Clinical Lists Changes  Orders: Added new Referral order of Cardiac Rehabilitation (Cardiac Rehab) - Signed

## 2010-08-05 NOTE — Procedures (Signed)
Summary: wound [email or web address]   Current Medications (verified): 1)  Px Enteric Aspirin 81 Mg Tbec (Aspirin) .... Take One Daily 2)  Coreg 12.5 Mg Tabs (Carvedilol) .... One Twice A Day 3)  Furosemide 40 Mg Tabs (Furosemide) .... Take 1 By Mouth Once Daily 4)  Glipizide 5 Mg Xr24h-Tab (Glipizide) .Marland Kitchen.. 1 By Mouth Two Times A Day 5)  Lisinopril 5 Mg Tabs (Lisinopril) .... One Twice A Day 6)  Crestor 20 Mg Tabs (Rosuvastatin Calcium) .... Take 1 By Mouth Once Daily 7)  Fish Oil 1000 Mg Caps (Omega-3 Fatty Acids) .... Take 1 By Mouth Once Daily As Needed 8)  Multivitamins  Tabs (Multiple Vitamin) .... Take 1 By Mouth Once Daily 9)  B Complex 100  Tabs (B Complex Vitamins) .... Take 1 By Mouth Once Daily As Needed 10)  Spironolactone 25 Mg Tabs (Spironolactone) .... Take 1  By Mouth Once Daily 11)  Warfarin Sodium 5 Mg Tabs (Warfarin Sodium) .... One Daily or As Directed 12)  Onetouch Ultra Blue  Strp (Glucose Blood) .... Use As Directed Once Daily  Allergies (verified): 1)  ! Erythromycin   ICD Specifications Following MD:  Thompson Grayer, MD     ICD Vendor:  [employer]     ICD Model Number:  [id number]     ICD Serial Number:  [id number] ICD DOI:  05/13/2010     ICD Implanting MD:  Thompson Grayer, MD  Lead 1:    Location: RA     DOI: 05/13/2010     Model #: [id number]     Serial #: [id number]     Status: active Lead 2:    Location: RV     DOI: 05/13/2010     Model #: [id number]     Serial #[id number]     Status: active  Indications::  ICM   ICD Follow Up Remote Check?  No Battery Voltage:  3.20 V     Charge Time:  8.5 seconds     Underlying rhythm:  SR ICD Dependent:  No       ICD Device Measurements Atrium:  Amplitude: 1.8 mV, Impedance: 437 ohms, Threshold: 0.5 V at 0.4 msec Right Ventricle:  Amplitude: 6.5 mV, Impedance: 342 ohms, Threshold: 0.75 V at 0.4 msec Shock Impedance: 42/50 ohms   Episodes MS Episodes:  0     Percent Mode Switch:  0     Coumadin:  Yes Shock:  0     ATP:  0      Nonsustained:  0     Atrial Pacing:  24.3%     Ventricular Pacing:  0.2%  Brady Parameters Mode DDD+     Lower Rate Limit:  60     Upper Rate Limit 130 PAV 200     Sensed AV Delay:  180  Tachy Zones VF:  200     Next Cardiology Appt Due:  08/06/2010 Tech Comments:  Steri strips removed, no redness or edema noted.  No parameter changes.  Device function normal.  ROV 3 months with Dr. Rayann Heman. Alma Friendly, LPN  November 17, [id number] 4:23 PM

## 2010-08-05 NOTE — Medication Information (Signed)
Summary: rov/sp  Anticoagulant Therapy  Managed by: Tula Nakayama, RN, BSN Referring MD: Aundra Dubin PCP: Rowe Clack MD Supervising MD: Ron Parker MD, Dellis Filbert Indication 1: Atrial Fibrillation Lab Used: [facility] [place] Site: [place] INR POC 2.4 INR RANGE 2.0-3.0  Dietary changes: no    Health status changes: no    Bleeding/hemorrhagic complications: no    Recent/future hospitalizations: no    Any changes in medication regimen? no    Recent/future dental: no  Any missed doses?: no       Is patient compliant with meds? yes      Comments: Pt took 7.5mg s on Fri, Sat, and Sun and also Lovenox. Took last dose of Lovenox of Sunday night.   Allergies: 1)  ! Erythromycin  Anticoagulation Management History:      The patient is taking warfarin and comes in today for a routine follow up visit.  Positive risk factors for bleeding include presence of serious comorbidities.  Negative risk factors for bleeding include an age less than 36 years old.  The bleeding index is 'intermediate risk'.  Positive CHADS2 values include History of CHF, History of HTN, and History of Diabetes.  Negative CHADS2 values include Age > 2 years old.  His last INR was 1.0 ratio.  Anticoagulation responsible provider: Ron Parker MD, Dellis Filbert.  INR POC: 2.4.  Cuvette Lot#: [id number].  Exp: 05/2011.    Anticoagulation Management Assessment/Plan:      The patient's current anticoagulation dose is Warfarin sodium 5 mg tabs: one daily or as directed.  The target INR is 2.0-3.0.  The next INR is due 04/14/2010.  Anticoagulation instructions were given to patient.  Results were reviewed/authorized by Tula Nakayama, RN, BSN.  He was notified by Tula Nakayama, RN, BSN.         Prior Anticoagulation Instructions: INR 1.8  Take 1 1/2 tablets today and tomorrow then resume same dose of 1 tablet every day. If pt given Lovenox Rx after DCCV, will recheck INR on 10/3.  If no Lovenox prescribed, will change appt to  10/7.  Current Anticoagulation Instructions: INR 2.4 Change dose to 5mg s everyday except 7.5mg s on Wednesdays.  Discontinue Lovenox. Recheck in one week.

## 2010-08-05 NOTE — Medication Information (Signed)
Summary: rov/tm  Anticoagulant Therapy  Managed by: Bonnita Nasuti, PharmD, BCPS, CPP Referring MD: Aundra Dubin PCP: Rowe Clack MD Supervising MD: Aundra Dubin MD, [place] Indication 1: Atrial Fibrillation Lab Used: [facility] [place] Site: [place] INR POC 1.4 INR RANGE 2.0-3.0  Dietary changes: no    Health status changes: no    Bleeding/hemorrhagic complications: no    Recent/future hospitalizations: no    Any changes in medication regimen? no    Recent/future dental: no  Any missed doses?: no       Is patient compliant with meds? yes       Current Medications (verified): 1)  Px Enteric Aspirin 81 Mg Tbec (Aspirin) .... Take One Daily 2)  Coreg 12.5 Mg Tabs (Carvedilol) .... One Twice A Day 3)  Furosemide 40 Mg Tabs (Furosemide) .... Take 1 By Mouth Once Daily 4)  Glipizide 5 Mg Xr24h-Tab (Glipizide) .Marland Kitchen.. 1 By Mouth Two Times A Day 5)  Lisinopril 5 Mg Tabs (Lisinopril) .... One Twice A Day 6)  Crestor 20 Mg Tabs (Rosuvastatin Calcium) .... Take 1 By Mouth Once Daily 7)  Fish Oil 1000 Mg Caps (Omega-3 Fatty Acids) .... Take 1 By Mouth Once Daily As Needed 8)  Multivitamins  Tabs (Multiple Vitamin) .... Take 1 By Mouth Once Daily 9)  B Complex 100  Tabs (B Complex Vitamins) .... Take 1 By Mouth Once Daily As Needed 10)  Spironolactone 25 Mg Tabs (Spironolactone) .... Take 1  By Mouth Once Daily 11)  Warfarin Sodium 5 Mg Tabs (Warfarin Sodium) .... One Daily or As Directed 12)  Onetouch Ultra Blue  Strp (Glucose Blood) .... Use As Directed Once Daily  Allergies (verified): 1)  ! Erythromycin  Anticoagulation Management History:      The patient is taking warfarin and comes in today for a routine follow up visit.  Positive risk factors for bleeding include presence of serious comorbidities.  Negative risk factors for bleeding include an age less than 61 years old.  The bleeding index is 'intermediate risk'.  Positive CHADS2 values include History of CHF,  History of HTN, and History of Diabetes.  Negative CHADS2 values include Age > 59 years old.  His last INR was 1.0 ratio.  Anticoagulation responsible provider: Aundra Dubin MD, [place].  INR POC: 1.4.  Cuvette Lot#: [id number].  Exp: 05/2011.    Anticoagulation Management Assessment/Plan:      The patient's current anticoagulation dose is Warfarin sodium 5 mg tabs: one daily or as directed.  The target INR is 2.0-3.0.  The next INR is due 06/05/2010.  Anticoagulation instructions were given to patient.  Results were reviewed/authorized by Bonnita Nasuti, PharmD, BCPS, CPP.         Prior Anticoagulation Instructions: INR 1.4 Today take extra 5mg s then 5mg s daily except 7.5mg s on Wednesdays. Recheck in one week.   Current Anticoagulation Instructions: INR 1.4  Coumadin 5mg  tabs Take 2 tabs today WED 11/23 then 1.5tab MON, WED, FRI, 1 tab other days

## 2010-08-05 NOTE — Progress Notes (Signed)
----   Converted from flag ---- ---- 11/22/2009 9:42 AM, Denice Paradise wrote: done 5/26 appt w/sae  ---- 11/22/2009 9:26 AM, Ophelia Charter wrote: Please schedule with Dr Loanne Drilling.  Thanks  ---- 11/22/2009 9:11 AM, Rowe Clack MD wrote: The following orders have been entered for this patient and placed on Admin Hold:  Type:     Referral       Code:   Radiology Description:   Radiology Referral Order Date:   11/22/2009   Authorized By:   Rowe Clack MD Order #:   [id number] Clinical Notes:   Name of Test or Procedure:MRI c-spine w/o contrast -  Of What:neck pain, progressive myelopathic gait x 2 years - r/o DDD - suspect stenosis  Special Instructions  Type:     Referral       Code:   Endocrine Description:   Endocrinology Referral Order Date:   11/22/2009   Authorized By:   Rowe Clack MD Order #:   [id number] Clinical Notes:   [facility] - eval and tx DM ------------------------------

## 2010-08-05 NOTE — Letter (Signed)
Summary: Implantable Device Instructions  Press photographer, [name]  [id number] [place]   [place], [place] [address]   Phone: [phone]  Fax: [phone]      Implantable Device Instructions  You are scheduled for:   _____ Generator Change  on 05/13/10 with Dr. Rayann Heman.  1.  Please arrive at the [place] at [facility] at 8:45M on the day of your procedure.  2.  Do not eat or drink the night before your procedure.  3.  Complete lab work on 05/06/10.  You do not have to be fasting.  4.  Do NOT take these medications for the morning of your procedure:  Furosemide, Glipizide .  Take your last dose of Coumadin on ---WILL CALL IF YOU NEED TO HOLD.  5.  Plan for an overnight stay.  Bring your insurance cards and a list of your medications.  6.  Wash your chest and neck with antibacterial soap (any brand) the evening before and the morning of your procedure.  Rinse well.   *If you have ANY questions after you get home, please call the office [phone]. Anthony Rubio  *Every attempt is made to prevent procedures from being rescheduled.  Due to the nauture of Electrophysiology, rescheduling can happen.  The physician is always aware and directs the staff when this occurs.

## 2010-08-05 NOTE — Cardiovascular Report (Signed)
Summary: Office Visit   Office Visit   Imported By: Sallee Provencal 05/26/2010 15:51:41  _____________________________________________________________________  External Attachment:    Type:   Image     Comment:   External Document

## 2010-08-05 NOTE — Miscellaneous (Signed)
Summary: Orders Update  Clinical Lists Changes  Orders: Added new Test order of TLB-BMP (Basic Metabolic Panel-BMET) (80048-METABOL) - Signed 

## 2010-08-05 NOTE — Assessment & Plan Note (Signed)
Summary: 6 wk f/u #?cd   Vital Signs:  Patient profile:   60 year old male Height:      76 inches (193.04 cm) Weight:      252.12 pounds (114.60 kg) O2 Sat:      97 % on Room air Temp:     98.6 degrees F (37.00 degrees C) oral Pulse rate:   70 / minute BP sitting:   110 / 68  (left arm) Cuff size:   large  Vitals Entered By: Tomma Lightning (January 02, 2010 8:50 AM)  O2 Flow:  Room air CC: 6 week follow-up Is Patient Diabetic? Yes Did you bring your meter with you today? No Pain Assessment Patient in pain? no      CBG Result 145   Primary Care Provider:  Rowe Clack MD  CC:  6 week follow-up.  History of Present Illness: here for followup -  1) DM2, uncontrolled - check sugars 1-2x/week - waiting on supplies from [place] - deneis symptoms of low sugar - no sweating - denies PU/PD - reports compliance with ongoing medical treatment and no changes in medication dose or frequency. denies adverse side effects related to current therapy.   2) dyslipidemia - reports compliance with ongoing medical treatment and no changes in medication dose or frequency. denies adverse side effects related to current therapy. no GI or muscle complaints  3) CAD/CHF - recent hosp for acue exac of same - 5/4-5/7 at [facility] - reports compliance with ongoing medical treatment and no changes in medication dose or frequency. denies adverse side effects related to current therapy. no leg swelling or SOB  4) gait d/o - ongoing for 2-3 years - relates change to wearing lift off cast on left foot for wound healing in 2007-8 - now poor dorsiflex L>R foot - no falls, no use of walk asst aide - never been in PT - feels "like i walk drunk" but not dizzy or numb -  has seen Nsurg for same - c - spine with spondolytic changes but no severe stenosis - plans for l - spine MRI and EMG - and f/u  01/2010   Clinical Review Panels:  Lipid Management   Cholesterol:  232 (11/06/2009)   LDL (bad choesterol):  177  (11/06/2009)   HDL (good cholesterol):  24 (11/06/2009)   Triglycerides:  157 (11/06/2009)  Diabetes Management   HgBA1C:  10.5 (11/28/2009)   Creatinine:  1.1 (12/19/2009)   Last Foot Exam:  yes (11/22/2009)   Last Pneumovax:  Historical given @ [place] (11/06/2009)  CBC   WBC:  7.0 (11/07/2009)   RBC:  4.59 (11/07/2009)   Hgb:  13.2 (11/07/2009)   Hct:  13.2 (11/07/2009)   Platelets:  264 (11/07/2009)   MCV  86.8 (11/07/2009)   RDW  12.7 (11/06/2009)   PMN:  63 (11/06/2009)   Monos:  7 (11/06/2009)   Eosinophils:  1 (11/06/2009)   Basophil:  3 (11/06/2009)  Complete Metabolic Panel   Glucose:  167 (12/19/2009)   Sodium:  142 (12/19/2009)   Potassium:  4.5 (12/19/2009)   Chloride:  109 (12/19/2009)   CO2:  27 (12/19/2009)   BUN:  31 (12/19/2009)   Creatinine:  1.1 (12/19/2009)   Calcium:  9.3 (12/19/2009)   Current Medications (verified): 1)  Px Enteric Aspirin 81 Mg Tbec (Aspirin) .... Take 2 By Mouth Once Daily 2)  Coreg 12.5 Mg Tabs (Carvedilol) .... One Tablet Twice A Day 3)  Plavix 75 Mg Tabs (Clopidogrel  Bisulfate) .... Take 1 By Mouth Once Daily 4)  Furosemide 40 Mg Tabs (Furosemide) .... Take 1 By Mouth Once Daily 5)  Glipizide 5 Mg Xr24h-Tab (Glipizide) .Marland Kitchen.. 1 By Mouth Two Times A Day 6)  Lisinopril 5 Mg Tabs (Lisinopril) .... One Twice A Day 7)  Crestor 20 Mg Tabs (Rosuvastatin Calcium) .... Take 1 By Mouth Once Daily 8)  Fish Oil 1000 Mg Caps (Omega-3 Fatty Acids) .... Take 1 By Mouth Once Daily 9)  Multivitamins  Tabs (Multiple Vitamin) .... Take 1 By Mouth Once Daily 10)  B Complex 100  Tabs (B Complex Vitamins) .... Take 1 By Mouth Once Daily 11)  Zinc 100 Mg Tabs (Zinc) .... Take 1 By Mouth Once Daily 12)  Spironolactone 25 Mg Tabs (Spironolactone) .... Take 1  By Mouth Once Daily 13)  Valium 5 Mg Tabs (Diazepam) .... Take 1 Tab By Mouth 30 Minutes Before Mri. May Repeat If Needed 15 Minutes Later If Needed.  Allergies (verified): 1)  !  Erythromycin  Past History:  Past Medical History: 1. CAD: LHC (5/11) with 50-60% mid LAD, diffuse moderate distal LAD up to 60-70%, small OM2 subtotally occluded, moderate to severe diffuse distal PLOM disease, severe diffuse distal RCA, PDA, and PLV disease.  Coronaries were ectatic with slow flow.  Patient was started on Plavix given concern for thrombus formation in the ectatic coronaries.   2. CHF: Systolic dysfunction, likely mixed ischemic/nonischemic.  Echo (5/11) with EF 15-20%, severe global hypokinesis, mild MR, moderate diastolic dysfunction, mildly decreased RV systolic function.  RHC (5/11) with mean RA 14 mmHg, PA 38/26, mean PCWP 25 mmHg, CI 1.8.  3. Type II diabetes, uncontrolled 4. HTN 5. Prior smoker 6. severe diabetic neuropathy -with chronic gait dysfunction   History of left foot ulcer with poor healing   MD roster: cards - [facility] endo - ellison nsurg - elsner  Review of Systems       The patient complains of difficulty walking.  The patient denies fever, weight loss, abdominal pain, severe indigestion/heartburn, and depression.    Physical Exam  General:  alert, well-developed, well-nourished, and cooperative to examination.    Lungs:  normal respiratory effort, no intercostal retractions or use of accessory muscles; normal breath sounds bilaterally - no crackles and no wheezes.    Heart:  normal rate, regular rhythm, no murmur, and no rub. BLE without edema.   Impression & Recommendations:  Problem # 1:  GAIT DISTURBANCE (ICD-781.2) has seen Nsurg - consult OV reviewed today - likely due to severe diabetic neuropathy - EMG to be done in few weeks - also L spine MRI no cervical myelopathy despite cevical spondyolsis on MRI Time spent with patient 25 minutes, more than 50% of this time was spent counseling patient on test results and need for DM med compliance to avoid further complications in future  Problem # 2:  POLYNEUROPATHY (ICD-357.9) see gait  discussion above  Problem # 3:  DIABETES MELLITUS, TYPE II (ICD-250.00)  uncontrolled but improving compliance - cont same and reck a1c in 3 months - His updated medication list for this problem includes:    Px Enteric Aspirin 81 Mg Tbec (Aspirin) .Marland Kitchen... Take 2 by mouth once daily    Glipizide 5 Mg Xr24h-tab (Glipizide) .Marland Kitchen... 1 by mouth two times a day    Lisinopril 5 Mg Tabs (Lisinopril) ..... One twice a day  Orders: Glucose, (CBG) [phone])  Labs Reviewed: Creat: 1.1 (12/19/2009)    Reviewed HgBA1c results: 10.5 (  11/28/2009)  11.5 (11/05/2009)  Problem # 4:  ESSENTIAL HYPERTENSION, BENIGN (ICD-401.1)  His updated medication list for this problem includes:    Coreg 12.5 Mg Tabs (Carvedilol) ..... One tablet twice a day    Furosemide 40 Mg Tabs (Furosemide) .Marland Kitchen... Take 1 by mouth once daily    Lisinopril 5 Mg Tabs (Lisinopril) ..... One twice a day    Spironolactone 25 Mg Tabs (Spironolactone) .Marland Kitchen... Take 1  by mouth once daily  BP today: 110/68 Prior BP: 110/80 (12/19/2009)  Labs Reviewed: K+: 4.5 (12/19/2009) Creat: : 1.1 (12/19/2009)   Chol: 232 (11/06/2009)   HDL: 24 (11/06/2009)   LDL: 177 (11/06/2009)   TG: 157 (11/06/2009)  Complete Medication List: 1)  Px Enteric Aspirin 81 Mg Tbec (Aspirin) .... Take 2 by mouth once daily 2)  Coreg 12.5 Mg Tabs (Carvedilol) .... One tablet twice a day 3)  Plavix 75 Mg Tabs (Clopidogrel bisulfate) .... Take 1 by mouth once daily 4)  Furosemide 40 Mg Tabs (Furosemide) .... Take 1 by mouth once daily 5)  Glipizide 5 Mg Xr24h-tab (Glipizide) .Marland Kitchen.. 1 by mouth two times a day 6)  Lisinopril 5 Mg Tabs (Lisinopril) .... One twice a day 7)  Crestor 20 Mg Tabs (Rosuvastatin calcium) .... Take 1 by mouth once daily 8)  Fish Oil 1000 Mg Caps (Omega-3 fatty acids) .... Take 1 by mouth once daily 9)  Multivitamins Tabs (Multiple vitamin) .... Take 1 by mouth once daily 10)  B Complex 100 Tabs (B complex vitamins) .... Take 1 by mouth once daily 11)   Zinc 100 Mg Tabs (Zinc) .... Take 1 by mouth once daily 12)  Spironolactone 25 Mg Tabs (Spironolactone) .... Take 1  by mouth once daily  Patient Instructions: 1)  it was good to see you today. 2)  your medications reviewed today - continue same 3)  Please schedule a follow-up appointment in 3 months to monitor diabetes, call sooner if problems.  4)  See your eye doctor yearly to check for diabetic eye damage. 5)  Check your feet each night for sore areas, calluses or signs of infection.

## 2010-08-05 NOTE — Progress Notes (Signed)
Summary: glipizide  Phone Note Refill Request Message from:  Fax from Pharmacy on March 14, 2010 11:45 AM  Refills Requested: Medication #1:  Glipizide xl 2.5 take 1 two times a day   Last Refilled: 12/13/2009  Method Requested: Electronic Initial call taken by: Tomma Lightning RMA,  March 14, 2010 11:46 AM  Follow-up for Phone Call        Sent fax back denied dosage was increased to 5mg  two times a day on 11/22/09. will send refill on glipizide 5mg  Follow-up by: Tomma Lightning RMA,  March 14, 2010 11:52 AM    Prescriptions: GLIPIZIDE 5 MG XR24H-TAB (GLIPIZIDE) 1 by mouth two times a day  #60 x 5   Entered by:   Tomma Lightning RMA   Authorized by:   Rowe Clack MD   Signed by:   Tomma Lightning RMA on 03/14/2010   Method used:   Electronically to        [facility]  [phone]* (retail)       [address]       [place], [place]  [address]       Ph: [phone] or [phone]       Fax: [phone]   RxID:   [id number]

## 2010-08-05 NOTE — Medication Information (Signed)
Summary: new afib/cardioversion at 11:30/sp  Anticoagulant Therapy  Managed by: Porfirio Oar, PharmD Referring MD: Aundra Dubin PCP: Rowe Clack MD Supervising MD: Harrington Challenger MD, Nevin Bloodgood Indication 1: Atrial Fibrillation Lab Used: [facility] Site: [place] INR POC 1.8 INR RANGE 2.0-3.0  Dietary changes: no    Health status changes: no    Bleeding/hemorrhagic complications: no    Recent/future hospitalizations: yes       Details: having DCCV today.  Since INR <2.0, will give Lovenox injection prior to procedure  Any changes in medication regimen? no    Recent/future dental: no  Any missed doses?: no       Is patient compliant with meds? yes      Comments: Started Coumadin on Sunday.  Completed education last week while seeing Dr. Aundra Dubin.  Pt educated on bleeding risks, dietary concerns, and medication interactions.  Expressed understanding  Allergies: 1)  ! Erythromycin  Anticoagulation Management History:      The patient is taking warfarin and comes in today for a routine follow up visit.  Positive risk factors for bleeding include presence of serious comorbidities.  Negative risk factors for bleeding include an age less than 16 years old.  The bleeding index is 'intermediate risk'.  Positive CHADS2 values include History of CHF, History of HTN, and History of Diabetes.  Negative CHADS2 values include Age > 50 years old.  His last INR was 1.0 ratio.  Anticoagulation responsible provider: Harrington Challenger MD, Nevin Bloodgood.  INR POC: 1.8.  Cuvette Lot#: [id number].  Exp: 05/2011.    Anticoagulation Management Assessment/Plan:      The patient's current anticoagulation dose is Warfarin sodium 5 mg tabs: one daily or as directed.  The target INR is 2.0-3.0.  The next INR is due 04/07/2010.  Anticoagulation instructions were given to patient.  Results were reviewed/authorized by Porfirio Oar, PharmD.  He was notified by Porfirio Oar PharmD.         Current Anticoagulation  Instructions: INR 1.8  Take 1 1/2 tablets today and tomorrow then resume same dose of 1 tablet every day. If pt given Lovenox Rx after DCCV, will recheck INR on 10/3.  If no Lovenox prescribed, will change appt to 10/7.   Medication Administration  Injection # 1:    Medication: Lovenox 100mg      Diagnosis: ATRIAL FLUTTER (ICD-427.32)    Route: SQ    Site: abdomen    Exp Date: 03/2011    Lot #: [id number]    Mfr: sandoz    Comments: Pt given Lovenox 100mg  subcutaneously in abdomen prior to DCCV.  Pt tolerated with no complications. Pt instructed on injection technique     Patient tolerated injection without complications    Given by: Porfirio Oar PharmD (April 04, 2010 09:05 AM)

## 2010-08-05 NOTE — Progress Notes (Signed)
Summary: MRI/VAL pt  Phone Note Call from Patient Call back at Home Phone [phone]   Caller: Patient Summary of Call: pt called stating that he went to have scheduled MRI at [facility] yesterday but testing had to be stopped because pt was uncomfortable and nervous in MRI due to "enclosure". Pt was told to contact VAL for a something to keep him "calm" - Valium. Please advise, pt is re-scheduled for tomorrow. Initial call taken by: Crissie Sickles, [place],  December 05, 2009 1:09 PM  Follow-up for Phone Call        OK for valium 5mg , #2,  to take 30 minutes before study. May repeat at 15 minutes if not feeling effect. Might need a driver, especially if he takes 10 mg Follow-up by: Neena Rhymes MD,  December 05, 2009 1:44 PM  Additional Follow-up for Phone Call Additional follow up Details #1::        pt informed of MEN instructions, Rx called into pharmacy. Additional Follow-up by: Crissie Sickles, CMA,  December 05, 2009 2:46 PM    New/Updated Medications: VALIUM 5 MG TABS (DIAZEPAM) take 1 tab by mouth 30 minutes before MRI. May repeat if needed 15 minutes later if needed. Prescriptions: VALIUM 5 MG TABS (DIAZEPAM) take 1 tab by mouth 30 minutes before MRI. May repeat if needed 15 minutes later if needed.  #2 x 0   Entered and Authorized by:   Crissie Sickles, CMA   Signed by:   Crissie Sickles, CMA on 12/06/2009   Method used:   Telephoned to ...       [facility]  [phone]* (retail)       [address]       [place], [place]  [address]       Ph: [phone] or [phone]       Fax: [phone]   RxID:   [id number]

## 2010-08-05 NOTE — Cardiovascular Report (Signed)
Summary: Pre Op Orders   Pre Op Orders   Imported By: Sallee Provencal 05/26/2010 16:42:13  _____________________________________________________________________  External Attachment:    Type:   Image     Comment:   External Document

## 2010-08-05 NOTE — Consult Note (Signed)
Summary: [facility]   Imported By: Rise Patience 01/03/2010 14:31:19  _____________________________________________________________________  External Attachment:    Type:   Image     Comment:   External Document

## 2010-08-05 NOTE — Letter (Signed)
Summary: Cardioversion/TEE Instructions  Press photographer, [place] [address] [place]   [place], [place] [address]   Phone: [phone]  Fax: [phone]    TEE Cardioversion Instructions  03/28/2010 MRN: [id number]  Anthony Rubio [address] [place], [place]  [address]  Dear [name], You are scheduled for a TEE Cardioversion on Friday September 30 with Dr. [name].   Please arrive at the [facility] at 9:30  a.m.  on the day of your procedure.  1)   DIET:  A)   Nothing to eat or drink after midnight except your medications with a sip of water.    2)  Come to the [place] office on Friday September 30 at 8:30am. for lab work.   3)   MAKE SURE YOU TAKE YOUR COUMADIN.  4)   A)   DO NOT TAKE these medications before your procedure:     DO NOT TAKE GLIPIZDE ON FRIDAY MORNING.    B)   YOU MAY TAKE ALL of your remaining medications with a small amount of water.     5)  Must have a responsible person to drive you home.  6)   Bring a current list of your medications and current insurance cards.   * Special Note:  Every effort is made to have your procedure done on time. Occasionally there are emergencies that present themselves at the hospital that may cause delays. Please be patient if a delay does occur.  * If you have any questions after you get home, please call the office at [phone].

## 2010-08-05 NOTE — Medication Information (Signed)
Summary: rov/ewj  Anticoagulant Therapy  Managed by: Tula Nakayama, RN, BSN Referring MD: Aundra Dubin PCP: Rowe Clack MD Supervising MD: Johnsie Cancel MD, Collier Salina Indication 1: Atrial Fibrillation Lab Used: [facility] [place] Site: [place] INR POC 1.4 INR RANGE 2.0-3.0  Dietary changes: no    Health status changes: no    Bleeding/hemorrhagic complications: no    Recent/future hospitalizations: yes       Details: Admitted from 11/8  to 11/9 for Defib placement  Any changes in medication regimen? no    Recent/future dental: no  Any missed doses?: no       Is patient compliant with meds? yes      Comments: 05/13/10 Had defib placed by Dr Rayann Heman  Allergies: 1)  ! Erythromycin  Anticoagulation Management History:      The patient is taking warfarin and comes in today for a routine follow up visit.  Positive risk factors for bleeding include presence of serious comorbidities.  Negative risk factors for bleeding include an age less than 56 years old.  The bleeding index is 'intermediate risk'.  Positive CHADS2 values include History of CHF, History of HTN, and History of Diabetes.  Negative CHADS2 values include Age > 5 years old.  His last INR was 1.0 ratio.  Anticoagulation responsible provider: Johnsie Cancel MD, Collier Salina.  INR POC: 1.4.  Cuvette Lot#: [id number].  Exp: 05/2011.    Anticoagulation Management Assessment/Plan:      The patient's current anticoagulation dose is Warfarin sodium 5 mg tabs: one daily or as directed.  The target INR is 2.0-3.0.  The next INR is due 05/28/2010.  Anticoagulation instructions were given to patient.  Results were reviewed/authorized by Tula Nakayama, RN, BSN.  He was notified by Tula Nakayama, RN, BSN.         Prior Anticoagulation Instructions: INR 1.0  Take 10mg  today and tomorrow, then resume same dosage 5mg  daily except 7.5mg  on Wednesdays. Having Defib placed on 05/13/10.  Recheck in 2 weeks.   Current Anticoagulation  Instructions: INR 1.4 Today take extra 5mg s then 5mg s daily except 7.5mg s on Wednesdays. Recheck in one week.

## 2010-08-05 NOTE — Progress Notes (Signed)
Summary: Pt request call  Phone Note Call from Patient Call back at Home Phone [phone]   Caller: Patient Reason for Call: Talk to Nurse Initial call taken by: Delsa Sale,  June 10, 2010 1:24 PM  Follow-up for Phone Call        I talked with pt--he states Lipitor is still going to be too much $ for him---[facility] has it in generic  but it is going to cost him over $100--he is going to try to find out if he can still get Crestor at a reasonable price or find out if the [place] has generic Lipitor for less $--he will call me back and let me know if he needs additional prescriptions

## 2010-08-05 NOTE — Medication Information (Signed)
Summary: rov coumadin - [facility] coming at 8:30 DM at 845  Anticoagulant Therapy  Managed by: Porfirio Oar, PharmD Referring MD: Aundra Dubin PCP: Rowe Clack MD Supervising MD: Stanford Breed MD, Aaron Edelman Indication 1: Atrial Fibrillation Lab Used: [facility] Site: [place] INR POC 3.2 INR RANGE 2.0-3.0  Dietary changes: no    Health status changes: no    Bleeding/hemorrhagic complications: no    Recent/future hospitalizations: no    Any changes in medication regimen? no    Recent/future dental: no  Any missed doses?: no       Is patient compliant with meds? yes       Allergies: 1)  ! Erythromycin  Anticoagulation Management History:      The patient is taking warfarin and comes in today for a routine follow up visit.  Positive risk factors for bleeding include presence of serious comorbidities.  Negative risk factors for bleeding include an age less than 22 years old.  The bleeding index is 'intermediate risk'.  Positive CHADS2 values include History of CHF, History of HTN, and History of Diabetes.  Negative CHADS2 values include Age > 55 years old.  His last INR was 1.0 ratio.  Anticoagulation responsible provider: Stanford Breed MD, Aaron Edelman.  INR POC: 3.2.  Cuvette Lot#: [id number].  Exp: 06/2011.    Anticoagulation Management Assessment/Plan:      The patient's current anticoagulation dose is Warfarin sodium 5 mg tabs: one daily or as directed.  The target INR is 2.0-3.0.  The next INR is due 06/20/2010.  Anticoagulation instructions were given to patient.  Results were reviewed/authorized by Porfirio Oar, PharmD.  He was notified by Porfirio Oar PharmD.         Prior Anticoagulation Instructions: INR 1.4  Coumadin 5mg  tabs Take 2 tabs today WED 11/23 then 1.5tab MON, WED, FRI, 1 tab other days  Current Anticoagulation Instructions: INR 3.2  Continue same dose of 1 tablet every day except 1 1/2 tablets on Wednesday.  Eat extra greens today and tomorrow.  Recheck  INR in 2 weeks.

## 2010-08-05 NOTE — Assessment & Plan Note (Signed)
Summary: per check out/saf   Visit Type:  Follow-up Referring Provider:  Elane Fritz MD Primary Provider:  Rowe Clack MD  CC:  no complaints.  History of Present Illness: 60 yo with history of CAD and mixed ischemic/nonischemic cardiomyopathy as well as poorly controlled diabetes presents to establish cardiology care.  Patient was admitted in 5/11 after several days of shortness of breath, orthopnea, and PND.  He has never had chest pain.  He was found to be volume overloaded with pulmonary edema.  He was diuresed and left/right heart catheterization was done.  Left heart cath showed large, ectatic coronaries with slow flow and moderate to severe diffuse distal vessel disease (consistent with diabetes) without good interventional options.  EF was 15-20% by echo.  Right heart cath after several days of diuresis still showed moderately elevated filling pressures and low cardiac index (1.8) with blood pressure.  Patient was put on milrinone and further diuresed for several more days.  He was titrated off milrinone and discharged on an oral medication regimen.   At baseline, he has difficulty walking due to a prior ulcer on the left foot that healed poorly and left him with a gait abnormality.  No shortness of breath walking around his house.  No lightheadedness.  He is not particularly active, mainly due to his chronic foot problem.  No PND or orthopnea.  No chest pain.  He has quit smoking.    Labs (5/11, hospital): BNP 1010 => 307, CKMB peak 7.4, TnI peak 0.14, HgbA1c 11.5, LDL 177, HDL 24, TSH normal Labs (5/11, post-discharge): K 5.0, creatinine 1.4, BNP 308 Labs (5/11): K 4.6, creatinine 1.4, hgbA1c 10.5  ECG: NSR, old inferior MI, old anterior MI, borderline IVCD (QRS 108 msec)  Current Medications (verified): 1)  Px Enteric Aspirin 81 Mg Tbec (Aspirin) .... Take 2 By Mouth Once Daily 2)  Coreg 6.25 Mg Tabs (Carvedilol) .... One Tablet Twice A Day 3)  Plavix 75 Mg Tabs  (Clopidogrel Bisulfate) .... Take 1 By Mouth Once Daily 4)  Furosemide 40 Mg Tabs (Furosemide) .... Take 1 By Mouth Once Daily 5)  Glipizide 5 Mg Xr24h-Tab (Glipizide) .Marland Kitchen.. 1 By Mouth Two Times A Day 6)  Lisinopril 2.5 Mg Tabs (Lisinopril) .... Take 1 By Mouth Two Times A Day 7)  Crestor 20 Mg Tabs (Rosuvastatin Calcium) .... Take 1 By Mouth Once Daily 8)  Fish Oil 1000 Mg Caps (Omega-3 Fatty Acids) .... Take 1 By Mouth Once Daily 9)  Multivitamins  Tabs (Multiple Vitamin) .... Take 1 By Mouth Once Daily 10)  B Complex 100  Tabs (B Complex Vitamins) .... Take 1 By Mouth Once Daily 11)  Zinc 100 Mg Tabs (Zinc) .... Take 1 By Mouth Once Daily 12)  Spironolactone 25 Mg Tabs (Spironolactone) .... Take 1  By Mouth Once Daily  Allergies (verified): 1)  ! Erythromycin  Past History:  Past Medical History: Reviewed history from 11/27/2009 and no changes required. 1. CAD: LHC (5/11) with 50-60% mid LAD, diffuse moderate distal LAD up to 60-70%, small OM2 subtotally occluded, moderate to severe diffuse distal PLOM disease, severe diffuse distal RCA, PDA, and PLV disease.  Coronaries were ectatic with slow flow.  Patient was started on Plavix given concern for thrombus formation in the ectatic coronaries.   2. CHF: Systolic dysfunction, likely mixed ischemic/nonischemic.  Echo (5/11) with EF 15-20%, severe global hypokinesis, mild MR, moderate diastolic dysfunction, mildly decreased RV systolic function.  RHC (5/11) with mean RA 14  mmHg, PA 38/26, mean PCWP 25 mmHg, CI 1.8.  3. Type II diabetes: poor control when admitted in 5/11.  4. HTN 5. Prior smoker 6. History of left foot ulcer with poor healing (took 2 years) resulting in chronic gait dysfunction.   Family History: Reviewed history from 11/28/2009 and no changes required. Family History of Colon CA 1st degree relative <60 9other relative) Heart disease (grandparent) Stroke (other relative) dm only in secondary relatives  Social  History: Reviewed history from 11/27/2009 and no changes required. Former Smoker, quit 11/2009 - 30+ pkyr hx no alcohol Former [employer] married, lives with wife  Retired Barrister's clerk    Review of Systems       All systems reviewed and negative except as per HPI.   Vital Signs:  Patient profile:   60 year old male Height:      76 inches Weight:      254 pounds Pulse rate:   66 / minute BP sitting:   112 / 73  (left arm)  Vitals Entered By: Lubertha Basque, CNA (December 05, 2009 11:01 AM)  Physical Exam  General:  Well developed, well nourished, in no acute distress. Neck:  Neck supple, no JVD. No masses, thyromegaly or abnormal cervical nodes. Lungs:  Clear bilaterally to auscultation and percussion. Heart:  Non-displaced PMI, chest non-tender; regular rate and rhythm, S1, S2 without murmurs, rubs or gallops. Carotid upstroke normal, no bruit.  Pedals normal pulses. No edema, no varicosities. Abdomen:  Bowel sounds positive; abdomen soft and non-tender without masses, organomegaly, or hernias noted. No hepatosplenomegaly. Extremities:  No clubbing or cyanosis. Neurologic:  Alert and oriented x 3. Psych:  Normal affect.   Impression & Recommendations:  Problem # 1:  CONGESTIVE HEART FAILURE (99991111) Systolic CHF.  This may be multifactorial, due to significant distal vessel CAD as well as poorly controlled diabetes.  He has NYHA class II-III symptoms.  He does not appear significantly volume overloaded and feels better than at last appointment. - Increase Coreg to 9.375 mg two times a day - Increase lisinopril to 5 mg two times a day.  - Continue current doses of spironolactone and Lasix.   - BMET/BNP in 2 wks with nursing BP and HR check.  If stable at that time, will increase Coreg to 12.5 mg two times a day and he will see me in a month.  - Echo in 8/11: If EF low on good medical management of CHF and diabetes, will arrange for ICD placement.   Problem # 2:   HYPERLIPIDEMIA (P102836.4) Goal LDL < 70.  He is on Crestor 20 mg daily.  Will need lipids/LFTs in 7/11.   Problem # 3:  CORONARY ARTERY DISEASE (ICD-414.00) Diffuse moderate to severe distal vessel disease on cath with ectatic coronaries with slow flow.  No interventional target.  Should continue ASA and Plavix (Plavix given slow flow with ectatic coronaries and concern for risk of thrombus formation).  He will also continue statin, ACEI, and beta blocker.  He has never had chest pain.  - He is going to do cardiac rehab.   Problem # 4:  DIABETES MELLITUS, TYPE II (ICD-250.00) Seeing Dr. Loanne Drilling for this.  Needs extensive work.   Other Orders: Echocardiogram (Echo)  Patient Instructions: 1)  Your physician has recommended you make the following change in your medication:  2)  Increase Coreg(carvedilol) to 9.375mg  twice a day--this will be one and one-half of a 6.25mg  tablet twice a day 3)  Increase Lisinopril to 5mg  twice a day--you can take two 2.5mg  tablets twice a day 4)  Your physician recommends that you schedule a follow-up appointment in 2 weeks with the nurse to check your blood pressure and pulse--if this is OK Dr Aundra Dubin will increase your medication. 5)  Lab in 2 weeks the same day you see the nurse to check your blood pressure and pulse--BMP/BNP 428.22 414.01 6)  Your physician recommends that you return for a FASTING lipid profile/liver profile the first of July. 7)  Your physician recommends that you schedule a follow-up appointment in July after you have the fasting lipid/liver profile.  428.22 414.01 8)  Your physician has requested that you have an echocardiogram.  Echocardiography is a painless test that uses sound waves to create images of your heart. It provides your doctor with information about the size and shape of your heart and how well your heart's chambers and valves are working.  This procedure takes approximately one hour. There are no restrictions for this procedure.  END OF AUGUST 2011 Prescriptions: LISINOPRIL 5 MG TABS (LISINOPRIL) one twice a day  #60 x 11   Entered by:   Desiree Lucy, RN, BSN   Authorized by:   [name], MD   Signed by:   Desiree Lucy, RN, BSN on 12/05/2009   Method used:   Electronically to        [facility]  [phone]* (retail)       [address]       [place], [place]  [address]       Ph: [phone] or [phone]       Fax: [phone]   RxID:   [id number] COREG 6.25 MG TABS (CARVEDILOL) one and one-half  tablet twice a day  #90 x 11   Entered by:   Desiree Lucy, RN, BSN   Authorized by:   [name], MD   Signed by:   Desiree Lucy, RN, BSN on 12/05/2009   Method used:   Electronically to        [facility]  [phone]* (retail)       [address]       [place], [place]  [address]       Ph: [phone] or [phone]       Fax: [phone]   RxID:   [id number]

## 2010-08-05 NOTE — Assessment & Plan Note (Signed)
Summary: bp check  Nurse Visit   Vital Signs:  Patient profile:   60 year old male Height:      76 inches Weight:      252.12 pounds BMI:     30.80 Pulse rate:   72 / minute Pulse rhythm:   regular BP sitting:   110 / 80  Vitals Entered By: Janan Halter, RN, BSN (December 20, 2009 6:31 PM)  CC:  pt in for labs and BP check  No complaints.   Current Medications (verified): 1)  Px Enteric Aspirin 81 Mg Tbec (Aspirin) .... Take 2 By Mouth Once Daily 2)  Coreg 6.25 Mg Tabs (Carvedilol) .... One and One-Half  Tablet Twice A Day 3)  Plavix 75 Mg Tabs (Clopidogrel Bisulfate) .... Take 1 By Mouth Once Daily 4)  Furosemide 40 Mg Tabs (Furosemide) .... Take 1 By Mouth Once Daily 5)  Glipizide 5 Mg Xr24h-Tab (Glipizide) .Marland Kitchen.. 1 By Mouth Two Times A Day 6)  Lisinopril 5 Mg Tabs (Lisinopril) .... One Twice A Day 7)  Crestor 20 Mg Tabs (Rosuvastatin Calcium) .... Take 1 By Mouth Once Daily 8)  Fish Oil 1000 Mg Caps (Omega-3 Fatty Acids) .... Take 1 By Mouth Once Daily 9)  Multivitamins  Tabs (Multiple Vitamin) .... Take 1 By Mouth Once Daily 10)  B Complex 100  Tabs (B Complex Vitamins) .... Take 1 By Mouth Once Daily 11)  Zinc 100 Mg Tabs (Zinc) .... Take 1 By Mouth Once Daily 12)  Spironolactone 25 Mg Tabs (Spironolactone) .... Take 1  By Mouth Once Daily 13)  Valium 5 Mg Tabs (Diazepam) .... Take 1 Tab By Mouth 30 Minutes Before Mri. May Repeat If Needed 15 Minutes Later If Needed.  Allergies (verified): 1)  ! Erythromycin   Patient Instructions: 1)  Instructed patient that we would call him with his lab results but that I thought that we would be increasing his Coreg to 12.5mg  two times a day.  We would show all of the informaton to Dr Annie Main and  be back in touch with him   Appended Document: bp check Increase Coreg to 12.5 mg bid  Appended Document: bp check discussed with pt by telephone-he agreed to increase Coreg to 12.5mg  twice a day   Clinical Lists  Changes  Medications: Changed medication from COREG 6.25 MG TABS (CARVEDILOL) one and one-half  tablet twice a day to COREG 12.5 MG TABS (CARVEDILOL) one tablet twice a day - Signed Rx of COREG 12.5 MG TABS (CARVEDILOL) one tablet twice a day;  #60 x 11;  Signed;  Entered by: Desiree Lucy, RN, BSN;  Authorized by: [name], MD;  Method used: Electronically to [facility]  [phone]*, [address] [place], [place], [place]  [address], Ph: [phone] or [phone], Fax: [phone]    Prescriptions: COREG 12.5 MG TABS (CARVEDILOL) one tablet twice a day  #60 x 11   Entered by:   Desiree Lucy, RN, BSN   Authorized by:   [name], MD   Signed by:   Desiree Lucy, RN, BSN on 12/23/2009   Method used:   Electronically to        [facility]  [phone]* (retail)       [address]       [place], [place]  [address]       Ph: [phone] or [phone]       Fax: [phone]  [id number]

## 2010-08-05 NOTE — Assessment & Plan Note (Signed)
Summary: PER LUCY 3 MTH FU--STC   Vital Signs:  Patient profile:   60 year old male Height:      76 inches (193.04 cm) Weight:      251.0 pounds (114.09 kg) O2 Sat:      92 % on Room air Temp:     97.0 degrees F (36.11 degrees C) oral Pulse rate:   94 / minute BP sitting:   88 / 60  (left arm) Cuff size:   large  Vitals Entered By: Tomma Lightning RMA (April 03, 2010 8:46 AM)  O2 Flow:  Room air CC: 3 month follow-up Is Patient Diabetic? Yes Did you bring your meter with you today? No Pain Assessment Patient in pain? no        Primary Care Provider:  Rowe Clack MD  CC:  3 month follow-up.  History of Present Illness: here for followup -  1) DM2 - check sugars 3-4x/week - waiting on supplies from [place] - denies symptoms of low sugar - no sweating - denies PU/PD - reports compliance with ongoing medical treatment and no changes in medication dose or frequency. denies adverse side effects related to current therapy. labs at [facility] show recent a1c 8.4 (to fax copy of labs here for review)  2) dyslipidemia - reports compliance with ongoing medical treatment and no changes in medication dose or frequency. denies adverse side effects related to current therapy. no GI or muscle complaints  3) CAD/CHF - hosp for acue exac 5/4-11/09/2009 at [facility] - reports compliance with ongoing medical treatment and no changes in medication dose or frequency. denies adverse side effects related to current therapy. no leg swelling or SOB - planning TEE CV tomorrow w/ cards for flutter  4) gait d/o - related to severe bilateral DM neuropathy - ongoing since 2007 - relates change to wearing lift off cast on left foot for wound healing - now poor dorsiflex L>R foot - no falls, no use of walk asst aide - never been in PT - feels "like i walk drunk" but not dizzy or numb -  has seen Nsurg for same 12/2009 - c - spine with spondolytic changes but no severe stenosis - planned for l - spine MRI and EMG  but ?missed  appt   Clinical Review Panels:  Immunizations   Last Flu Vaccine:  Fluvax 3+ (04/03/2010)   Last Pneumovax:  Historical given @ [facility] (11/06/2009)  Lipid Management   Cholesterol:  92 (01/09/2010)   LDL (bad choesterol):  55 (01/09/2010)   HDL (good cholesterol):  22.70 (01/09/2010)   Triglycerides:  157 (11/06/2009)  Diabetes Management   HgBA1C:  10.5 (11/28/2009)   Creatinine:  1.3 (03/28/2010)   Last Foot Exam:  yes (11/22/2009)   Last Flu Vaccine:  Fluvax 3+ (04/03/2010)   Last Pneumovax:  Historical given @ [facility] (11/06/2009)  CBC   WBC:  7.9 (03/28/2010)   RBC:  4.89 (03/28/2010)   Hgb:  14.2 (03/28/2010)   Hct:  42.2 (03/28/2010)   Platelets:  197.0 (03/28/2010)   MCV  86.4 (03/28/2010)   MCHC  33.7 (03/28/2010)   RDW  14.1 (03/28/2010)   PMN:  58.9 (03/28/2010)   Lymphs:  32.5 (03/28/2010)   Monos:  8.0 (03/28/2010)   Eosinophils:  0.0 (03/28/2010)   Basophil:  0.6 (03/28/2010)  Complete Metabolic Panel   Glucose:  194 (03/28/2010)   Sodium:  138 (03/28/2010)   Potassium:  5.4 (03/28/2010)   Chloride:  103 (03/28/2010)  CO2:  24 (03/28/2010)   BUN:  25 (03/28/2010)   Creatinine:  1.3 (03/28/2010)   Albumin:  3.8 (01/09/2010)   Total Protein:  6.8 (01/09/2010)   Calcium:  9.4 (03/28/2010)   Total Bili:  0.6 (01/09/2010)   Alk Phos:  52 (01/09/2010)   SGPT (ALT):  34 (01/09/2010)   SGOT (AST):  24 (01/09/2010)   Current Medications (verified): 1)  Px Enteric Aspirin 81 Mg Tbec (Aspirin) .... Take One Daily 2)  Coreg 12.5 Mg Tabs (Carvedilol) .... One Twice A Day 3)  Furosemide 40 Mg Tabs (Furosemide) .... Take 1 By Mouth Once Daily 4)  Glipizide 5 Mg Xr24h-Tab (Glipizide) .Marland Kitchen.. 1 By Mouth Two Times A Day 5)  Lisinopril 5 Mg Tabs (Lisinopril) .... One Twice A Day 6)  Crestor 20 Mg Tabs (Rosuvastatin Calcium) .... Take 1 By Mouth Once Daily 7)  Fish Oil 1000 Mg Caps (Omega-3 Fatty Acids) .... Take 1 By Mouth Once Daily 8)  Multivitamins  Tabs (Multiple  Vitamin) .... Take 1 By Mouth Once Daily 9)  B Complex 100  Tabs (B Complex Vitamins) .... Take 1 By Mouth Once Daily 10)  Spironolactone 25 Mg Tabs (Spironolactone) .... Take 1  By Mouth Once Daily 11)  Wellbutrin Sr 150 Mg Xr12h-Tab (Bupropion Hcl) .... One Daily For 3 Days Then One Twice A Day 12)  Warfarin Sodium 5 Mg Tabs (Warfarin Sodium) .... One Daily or As Directed  Allergies (verified): 1)  ! Erythromycin  Past History:  Past Medical History: 1. CAD: LHC (5/11) with 50-60% mid LAD, diffuse moderate distal LAD up to 60-70%, small OM2 subtotally occluded, moderate to severe diffuse distal PLOM disease, severe diffuse distal RCA, PDA, and PLV disease.  Coronaries were ectatic with slow flow.  Patient was started on Plavix given concern for thrombus formation in the ectatic coronaries.   2. CHF: Systolic dysfunction, likely mixed ischemic/nonischemic.  Echo (5/11) with EF 15-20%, severe global hypokinesis, mild MR, moderate diastolic dysfunction, mildly decreased RV systolic function.  RHC (5/11) with mean RA 14 mmHg, PA 38/26, mean PCWP 25 mmHg, CI 1.8.  Repeat echo (8/11) with EF 0000000, severe diastolic dysfunction, mild MR, RV normal size and systolic function.  Per official read, cannot rule out apical clot but it does appear to be only trabeculations.  3. Type II diabetes, uncontrolled 4. HTN 5. Smoker 6. severe diabetic neuropathy -with chronic gait dysfunction 7. History of left foot ulcer with poor healing   MD roster: cards - [facility] endo - ellison nsurg - elsner    Review of Systems  The patient denies fever, weight loss, chest pain, headaches, and abdominal pain.    Physical Exam  General:  alert, well-developed, well-nourished, and cooperative to examination.   wife at side Lungs:  normal respiratory effort, no intercostal retractions or use of accessory muscles; normal breath sounds bilaterally - no crackles and no wheezes.    Heart:  normal rate, regular rhythm,  no murmur, and no rub. BLE without edema. Neurologic:  high stepping gait due to B foot drop L>R - poor balance and proprioception - alert & oriented X3 and cranial nerves II-XII symetrically intact.  strength normal in all extremities, sensation intact to light touch. speech fluent without dysarthria or aphasia; follows commands with good comprehension.    Impression & Recommendations:  Problem # 1:  GAIT DISTURBANCE (ICD-781.2) Assessment Unchanged  s/p eval Nsurg 12/2009 - consult OV reviewed today - likely due to severe diabetic neuropathy -  no cervical myelopathy despite cevical spondyolsis on MRI  Time spent with patient 25 minutes, more than 50% of this time was spent counseling patient on test results from [place], plans for cardiac procedures and med adjustments and need for DM med compliance to avoid further complications in future  Problem # 2:  ATRIAL FLUTTER (ICD-427.32)  His updated medication list for this problem includes:    Px Enteric Aspirin 81 Mg Tbec (Aspirin) .Marland Kitchen... Take one daily    Coreg 12.5 Mg Tabs (Carvedilol) ..... One twice a day    Warfarin Sodium 5 Mg Tabs (Warfarin sodium) ..... One daily or as directed  Patient is noted to be newly in atrial flutter today - rate improved with inc in coreg but also now mild hypotension with fatigue and dizziness.  due to considerable CVA risk, now on coumadin and off Plavix (not able to get Pradaxa - not covered). due to concern for atrial flutter (with elevated rate) worsening his CHF, scheduled for cardioversion tomorrow 9/30- will be TEE-directed.  If INR is subtherapeutic, he can get Lovenox bridging. If atrial flutter returns, will look into getting it ablated. This is his first known episode.   Reviewed the following: PT: 10.7 (03/28/2010)   INR: 1.0 ratio (03/28/2010)  Problem # 3:  DIABETES MELLITUS, TYPE II (ICD-250.00)  His updated medication list for this problem includes:    Px Enteric Aspirin 81 Mg Tbec (Aspirin)  .Marland Kitchen... Take one daily    Glipizide 5 Mg Xr24h-tab (Glipizide) .Marland Kitchen... 1 by mouth two times a day    Lisinopril 5 Mg Tabs (Lisinopril) ..... One twice a day  uncontrolled but improving compliance - cont same; reports reck a1c is 8.4 -- wife will fax results for our records  Labs Reviewed: Creat: 1.3 (03/28/2010)    Reviewed HgBA1c results: 10.5 (11/28/2009)  11.5 (11/05/2009)  Problem # 4:  HYPERLIPIDEMIA (ICD-272.4)  His updated medication list for this problem includes:    Crestor 20 Mg Tabs (Rosuvastatin calcium) .Marland Kitchen... Take 1 by mouth once daily  Goal LDL < 70.   Labs Reviewed: SGOT: 24 (01/09/2010)   SGPT: 34 (01/09/2010)   HDL:22.70 (01/09/2010), 24 (11/06/2009)  LDL:55 (01/09/2010), 177 (11/06/2009)  Chol:92 (01/09/2010), 232 (11/06/2009)  Trig:71.0 (01/09/2010), 157 (11/06/2009)  Problem # 5:  SMOKER (ICD-305.1) not using wellbutrin due to cost - counseled on same - reports reduced use (<3cig/d) Encouraged smoking cessation and discussed different methods for smoking cessation.   Complete Medication List: 1)  Px Enteric Aspirin 81 Mg Tbec (Aspirin) .... Take one daily 2)  Coreg 12.5 Mg Tabs (Carvedilol) .... One twice a day 3)  Furosemide 40 Mg Tabs (Furosemide) .... Take 1 by mouth once daily 4)  Glipizide 5 Mg Xr24h-tab (Glipizide) .Marland Kitchen.. 1 by mouth two times a day 5)  Lisinopril 5 Mg Tabs (Lisinopril) .... One twice a day 6)  Crestor 20 Mg Tabs (Rosuvastatin calcium) .... Take 1 by mouth once daily 7)  Fish Oil 1000 Mg Caps (Omega-3 fatty acids) .... Take 1 by mouth once daily 8)  Multivitamins Tabs (Multiple vitamin) .... Take 1 by mouth once daily 9)  B Complex 100 Tabs (B complex vitamins) .... Take 1 by mouth once daily 10)  Spironolactone 25 Mg Tabs (Spironolactone) .... Take 1  by mouth once daily 11)  Warfarin Sodium 5 Mg Tabs (Warfarin sodium) .... One daily or as directed  Other Orders: Flu Vaccine 34yrs + MEDICARE PATIENTS [id number]) Administration Flu vaccine -  MCR (G0008) Flu Vaccine  Consent Questions     Do you have a history of severe allergic reactions to this vaccine? no    Any prior history of allergic reactions to egg and/or gelatin? no    Do you have a sensitivity to the preservative Thimersol? no    Do you have a past history of Guillan-Barre Syndrome? no    Do you currently have an acute febrile illness? no    Have you ever had a severe reaction to latex? no    Vaccine information given and explained to patient? yes    Are you currently pregnant? no    Lot Number:[id number]   Exp Date:01/03/2011   Site Given  Left Deltoid IM  Patient Instructions: 1)  it was good to see you today. 2)  your medications reviewed today - continue same for now, no changes 3)  Please schedule a follow-up appointment in 3-4 months to monitor diabetes, call sooner if problems.  4)  fax copies of your recent [facility] labs and brand of glucometer to our office as we discussed 5)  keep scheduled followup with cardiology as planned 6)  Continue to check your feet each night for sore areas, calluses or signs of infection.    Marland Kitchenlbmedflu

## 2010-08-05 NOTE — Progress Notes (Signed)
----   Converted from flag ---- ---- 05/20/2010 2:45 PM, Doug Sou CMA wrote: Pts pharmacy requesting a refill on pts Glipizide XL 2.5.  We do not follow this medication and were hoping you could help. [place].  Thank you  Estill Bamberg ------------------------------  Phone Note Outgoing Call   Call placed by: Crissie Sickles, [place],  May 20, 2010 3:13 PM Call placed to: Patient Summary of Call: Pt advised that Rx for Glipizide XL 5mg  1 tab two times a day was sent to [facility]. Pt will pick up Rx there. Initial call taken by: Crissie Sickles, CMA,  May 20, 2010 3:14 PM

## 2010-08-05 NOTE — Assessment & Plan Note (Signed)
Summary: new endo/leschber/diabetes/cd   Vital Signs:  Patient profile:   60 year old male Height:      76.5 inches (194.31 cm) Weight:      254.50 pounds (115.68 kg) O2 Sat:      96 % on Room air Temp:     97.8 degrees F (36.56 degrees C) oral Pulse rate:   73 / minute BP sitting:   110 / 70  (left arm) Cuff size:   large  Vitals Entered By: Gardenia Phlegm RMA (Nov 28, 2009 8:56 AM)  O2 Flow:  Room air CC: New Endo: Diabetes/ CF Is Patient Diabetic? Yes   Referring Provider:  Elane Fritz MD Primary Provider:  Rowe Clack MD  CC:  New Endo: Diabetes/ CF.  History of Present Illness: Anthony Rubio states 4 years h/o dm.  it is complicated by a foot ulcer, cad, chf, and renal insufficiency.  he has never been on insulin.  he takes glipizide.  no cbg record, but states cbg's are approx 200.   Anthony Rubio says his diet is good, and exercise is limited by medical conditions.   symptomatically, Anthony Rubio states few years of minimal numbness of the feet, but no assoc pain.   Current Medications (verified): 1)  Px Enteric Aspirin 81 Mg Tbec (Aspirin) .... Take 2 By Mouth Once Daily 2)  Coreg 6.25 Mg Tabs (Carvedilol) .... One Tablet Twice A Day 3)  Plavix 75 Mg Tabs (Clopidogrel Bisulfate) .... Take 1 By Mouth Once Daily 4)  Furosemide 40 Mg Tabs (Furosemide) .... Take 1 By Mouth Once Daily 5)  Glipizide 5 Mg Xr24h-Tab (Glipizide) .Marland Kitchen.. 1 By Mouth Two Times A Day 6)  Lisinopril 2.5 Mg Tabs (Lisinopril) .... Take 1 By Mouth Two Times A Day 7)  Crestor 20 Mg Tabs (Rosuvastatin Calcium) .... Take 1 By Mouth Once Daily 8)  Fish Oil 1000 Mg Caps (Omega-3 Fatty Acids) .... Take 1 By Mouth Once Daily 9)  Multivitamins  Tabs (Multiple Vitamin) .... Take 1 By Mouth Once Daily 10)  B Complex 100  Tabs (B Complex Vitamins) .... Take 1 By Mouth Once Daily 11)  Zinc 100 Mg Tabs (Zinc) .... Take 1 By Mouth Once Daily 12)  Spironolactone 25 Mg Tabs (Spironolactone) .... Take 1  By Mouth Once  Daily  Allergies (verified): 1)  ! Erythromycin  Past History:  Past Medical History: Last updated: 11/27/2009 1. CAD: LHC (5/11) with 50-60% mid LAD, diffuse moderate distal LAD up to 60-70%, small OM2 subtotally occluded, moderate to severe diffuse distal PLOM disease, severe diffuse distal RCA, PDA, and PLV disease.  Coronaries were ectatic with slow flow.  Patient was started on Plavix given concern for thrombus formation in the ectatic coronaries.   2. CHF: Systolic dysfunction, likely mixed ischemic/nonischemic.  Echo (5/11) with EF 15-20%, severe global hypokinesis, mild MR, moderate diastolic dysfunction, mildly decreased RV systolic function.  RHC (5/11) with mean RA 14 mmHg, PA 38/26, mean PCWP 25 mmHg, CI 1.8.  3. Type II diabetes: poor control when admitted in 5/11.  4. HTN 5. Prior smoker 6. History of left foot ulcer with poor healing (took 2 years) resulting in chronic gait dysfunction.   Family History: Reviewed history from 11/22/2009 and no changes required. Family History of Colon CA 1st degree relative <60 9other relative) Heart disease (grandparent) Stroke (other relative) dm only in secondary relatives  Social History: Reviewed history from 11/27/2009 and no changes required. Former Smoker, quit 11/2009 - 30+ pkyr hx  no alcohol Former [employer] married, lives with wife  Retired Barrister's clerk    Review of Systems       denies weight loss, blurry vision, headache, chest pain, n/v, urinary frequency, cramps, excessive diaphoresis, memory loss, rhinorrhea, depression, and easy bruising.  he has mild blurry vision.  sob is improved.  he seldom has hypoglycemia.   Physical Exam  General:  normal appearance.  normal appearance.   Head:  head: no deformity eyes: no periorbital swelling, no proptosis external nose and ears are normal mouth: no lesion seen Neck:  Supple without thyroid enlargement or tenderness.  Lungs:  Clear to auscultation bilaterally.  Normal respiratory effort.  Heart:  Regular rate and rhythm without murmurs or gallops noted. Normal S1,S2.   Abdomen:  abdomen is soft, nontender.  no hepatosplenomegaly.   not distended.  no hernia  Msk:  muscle bulk and strength are grossly normal, excpet decreased on the legs.  no obvious joint swelling.  he has difficulty bearing his body weight upon standing.  Pulses:  dorsalis pedis intact bilat.  no carotid bruit  Extremities:  no deformity.  no ulcer on the feet.  feet are of normal color and temp.  no edema. there is a healed ulcer at the plantar aspect of the left foot. Neurologic:  cn 2-12 grossly intact.   readily moves all 4's.   sensation is intact to touch on the feet, but severely decreased from normal. Skin:  normal texture and temp.  no rash.  not diaphoretic  Cervical Nodes:  No significant adenopathy.  Psych:  Alert and cooperative; normal mood and affect; normal attention span and concentration.   Additional Exam:  Hemoglobin A1C       [H]  10.5 %   Impression & Recommendations:  Problem # 1:  DIABETES MELLITUS, TYPE II ([id number]) uncertain control  Problem # 2:  CONGESTIVE HEART FAILURE (ICD-428.0) this is a relative contraindication to several dm meds  Problem # 3:  gait difficulty uncertain if neuropathy explains all of this  Problem # 4:  renal insufficiency this is a relative contraindication to metformin  Other Orders: TLB-A1C / Hgb A1C (Glycohemoglobin) (83036-A1C) TLB-Microalbumin/Creat Ratio, Urine (82043-MALB) Est. Patient Level IV [id number])  Patient Instructions: 1)  good diet and exercise habits significanly improve the control of your diabetes.  please let me know if you wish to be referred to a dietician.  high blood sugar is very risky to your health.  you should see an eye doctor every year. 2)  controlling your blood pressure and cholesterol drastically reduces the damage diabetes does to your body.  this also applies to quitting  smoking.  please discuss these with your doctor.  you should take an aspirin every day, unless you have been advised by a doctor not to. 3)  we will need to take this complex situation in stages 4)  check your blood sugar 2 times a day.  vary the time of day when you check, between before the 3 meals, and at bedtime.  also check if you have symptoms of your blood sugar being too high or too low.  please keep a record of the readings and bring it to your next appointment here.  please call us sooner if you are having low blood sugar episodes. 5)  blood tests are being ordered for you today.  please call [phone] to hear your test results.  based on this, i may advise an additional medication for your blood sugar.  6)  let me know if you wish to have a test to classify the cause of the leg weakness. 7)  (update: i left message on phone-tree: a1c is high.  we could add other oral meds, but i believe it will take insulin to control dm.  please let me know).

## 2010-08-05 NOTE — Medication Information (Signed)
Summary: ROV/TP  Anticoagulant Therapy  Managed by: Freddrick March, RN, BSN Referring MD: Aundra Dubin PCP: Rowe Clack MD Supervising MD: Aundra Dubin MD, Valma Rotenberg Indication 1: Atrial Fibrillation Lab Used: [facility] [place] Site: [place] INR POC 1.0 INR RANGE 2.0-3.0  Dietary changes: no    Health status changes: no    Bleeding/hemorrhagic complications: no    Recent/future hospitalizations: no    Any changes in medication regimen? no    Recent/future dental: no  Any missed doses?: yes     Details: Ran out of Coumadin Thursday last week 05/01/10  Is patient compliant with meds? yes       Allergies: 1)  ! Erythromycin  Anticoagulation Management History:      The patient is taking warfarin and comes in today for a routine follow up visit.  Positive risk factors for bleeding include presence of serious comorbidities.  Negative risk factors for bleeding include an age less than 28 years old.  The bleeding index is 'intermediate risk'.  Positive CHADS2 values include History of CHF, History of HTN, and History of Diabetes.  Negative CHADS2 values include Age > 75 years old.  His last INR was 1.0 ratio.  Anticoagulation responsible provider: Aundra Dubin MD, Lucius Wise.  INR POC: 1.0.  Cuvette Lot#: [id number].  Exp: 05/2011.    Anticoagulation Management Assessment/Plan:      The patient's current anticoagulation dose is Warfarin sodium 5 mg tabs: one daily or as directed.  The target INR is 2.0-3.0.  The next INR is due 05/19/2010.  Anticoagulation instructions were given to patient.  Results were reviewed/authorized by Freddrick March, RN, BSN.  He was notified by Freddrick March RN.         Prior Anticoagulation Instructions: INR:  2.0  Please continue your Coumadin at 5 mg (1 tablet) everyday except for 7.5 mg (1.5 tablet) on Wednesday.  Current Anticoagulation Instructions: INR 1.0  Take 10mg  today and tomorrow, then resume same dosage 5mg  daily except 7.5mg  on  Wednesdays. Having Defib placed on 05/13/10.  Recheck in 2 weeks.

## 2010-08-05 NOTE — Progress Notes (Signed)
Summary: samples of plavix  Phone Note Call from Patient Call back at Home Phone [phone]   Caller: Patient Reason for Call: Talk to Nurse Details for Reason: sample of plavix Initial call taken by: Neil Crouch,  February 13, 2010 2:37 PM  Follow-up for Phone Call        PT AWARE PLAVIX 75 MG #16 SAMPLES LEFT AT FRONT DESK FOR PICK UP LOT #[id number] EXP 2/12. Follow-up by: Devra Dopp, LPN,  August 11, [id number] 2:54 PM    Prescriptions: PLAVIX 75 MG TABS (CLOPIDOGREL BISULFATE) take 1 by mouth once daily  #16 x 0   Entered by:   Devra Dopp, LPN   Authorized by:   [name], MD   Signed by:   Devra Dopp, LPN on [id number]   Method used:   Samples Given   RxID:   [id number]

## 2010-08-05 NOTE — Progress Notes (Signed)
Summary: samples  Phone Note Call from Patient Call back at Home Phone [phone]   Caller: Patient Reason for Call: Talk to Nurse Summary of Call: request samples plavix and glipizide Initial call taken by: Darnell Level,  January 09, 2010 12:40 PM  Follow-up for Phone Call        [place], RN, BSN  January 09, 2010 1:44 PM  talked with pt by telephone--Plavix 75mg  #16 samples left at desk for pt to pick up Anthony Rubio

## 2010-08-05 NOTE — Medication Information (Signed)
Summary: rov/tp  Anticoagulant Therapy  Managed by: Vanessa Wadsworth, PharmD Referring MD: Aundra Dubin PCP: Rowe Clack MD Supervising MD: Percival Spanish MD, Jeneen Rinks Indication 1: Atrial Fibrillation Lab Used: [facility] Site: [place] INR POC 2 INR RANGE 2.0-3.0  Dietary changes: no    Health status changes: no    Bleeding/hemorrhagic complications: no    Recent/future hospitalizations: no    Any changes in medication regimen? no    Recent/future dental: no  Any missed doses?: no       Is patient compliant with meds? yes       Allergies: 1)  ! Erythromycin  Anticoagulation Management History:      Positive risk factors for bleeding include presence of serious comorbidities.  Negative risk factors for bleeding include an age less than 92 years old.  The bleeding index is 'intermediate risk'.  Positive CHADS2 values include History of CHF, History of HTN, and History of Diabetes.  Negative CHADS2 values include Age > 64 years old.  His last INR was 1.0 ratio.  Anticoagulation responsible provider: Percival Spanish MD, Jeneen Rinks.  INR POC: 2.  Exp: 05/2011.    Anticoagulation Management Assessment/Plan:      The patient's current anticoagulation dose is Warfarin sodium 5 mg tabs: one daily or as directed.  The target INR is 2.0-3.0.  The next INR is due 05/05/2010.  Anticoagulation instructions were given to patient.  Results were reviewed/authorized by Vanessa [place], PharmD.         Prior Anticoagulation Instructions: INR 2.4 Change dose to 5mg s everyday except 7.5mg s on Wednesdays.  Discontinue Lovenox. Recheck in one week.   Current Anticoagulation Instructions: INR:  2.0  Please continue your Coumadin at 5 mg (1 tablet) everyday except for 7.5 mg (1.5 tablet) on Wednesday.

## 2010-08-05 NOTE — Procedures (Signed)
Summary: [facility]   Current Medications (verified): 1)  Px Enteric Aspirin 81 Mg Tbec (Aspirin) .... Take One Daily 2)  Coreg 12.5 Mg Tabs (Carvedilol) .... One Twice A Day 3)  Furosemide 40 Mg Tabs (Furosemide) .... Take 1 By Mouth Once Daily 4)  Glipizide 5 Mg Xr24h-Tab (Glipizide) .Marland Kitchen.. 1 By Mouth Two Times A Day 5)  Lisinopril 5 Mg Tabs (Lisinopril) .... One Twice A Day 6)  Crestor 20 Mg Tabs (Rosuvastatin Calcium) .... Take 1 By Mouth Once Daily 7)  Multivitamins  Tabs (Multiple Vitamin) .... Take 1 By Mouth Once Daily 8)  B Complex 100  Tabs (B Complex Vitamins) .... Take 1 By Mouth Once Daily As Needed 9)  Spironolactone 25 Mg Tabs (Spironolactone) .... Take 1  By Mouth Once Daily 10)  Warfarin Sodium 5 Mg Tabs (Warfarin Sodium) .... One Daily or As Directed 11)  Onetouch Ultra Blue  Strp (Glucose Blood) .... Use As Directed Once Daily  Allergies (verified): 1)  ! Erythromycin   ICD Specifications Following MD:  Thompson Grayer, MD     ICD Vendor:  Medtronic     ICD Model Number:  [id number]     ICD Serial Number:  [id number] ICD DOI:  05/13/2010     ICD Implanting MD:  Thompson Grayer, MD  Lead 1:    Location: RA     DOI: 05/13/2010     Model #: [id number]     Serial #: [id number]     Status: active Lead 2:    Location: RV     DOI: 05/13/2010     Model #: [id number]     Serial #[id number]     Status: active  Indications::  ICM   ICD Follow Up Battery Voltage:  3.22 V     Charge Time:  8.5 seconds     Underlying rhythm:  SR ICD Dependent:  No       ICD Device Measurements Atrium:  Amplitude: 1.5 mV, Impedance: 456 ohms, Threshold: 0.50 V at 0.40 msec Right Ventricle:  Amplitude: 9.5 mV, Impedance: 380 ohms, Threshold: 0.75 V at 0.40 msec Shock Impedance: 45/53 ohms   Episodes MS Episodes:  0     Coumadin:  Yes Shock:  0     ATP:  0     Nonsustained:  0     Atrial Therapies:  0  Brady Parameters Mode MVP     Lower Rate Limit:  60     Upper Rate Limit 130 PAV 200      Sensed AV Delay:  180  Tachy Zones VF:  200     Next Cardiology Appt Due:  08/15/2010 Tech Comments:  PT SEEING [facility] DEVICE FUNCTION.  NO EPISODES SINCE LAST CHECK.  OPTIVOL STABLE.  DEMONSTRATED TONES FOR PT AND WIFE.  PT VOICES UNDERSTANDING ABOUT CALLING [facility] IF TONE IS HEARD.  SITE IS WELL HEALED.  CHANGED MAX LEAD IMPEDANCES FOR LIA. PT TO BE ENROLLED IN [place]. ROV 08-15-10 W/JA. Shelly Bombard  June 05, 2010 9:12 AM

## 2010-08-05 NOTE — Progress Notes (Signed)
Summary: STRIPS FOR METER  Phone Note Call from Patient Call back at Home Phone [phone]   Caller: [place] Summary of Call: [place].  HE HAS ONE TOUCH ULTRA.  FAXED INFO AND V A LAB RESULTS ON THURSDAY.  [phone] Initial call taken by: Glena Norfolk,  April 04, 2010 12:48 PM  Follow-up for Phone Call        Paperwork from [place] has not been recieved, verified with Lucy and VAL. left message on machine for pt to return my call  Crissie Sickles, CMA  April 04, 2010 2:35 PM

## 2010-08-07 NOTE — Cardiovascular Report (Signed)
Summary: Office Visit   Office Visit   Imported By: Sallee Provencal 06/16/2010 13:11:29  _____________________________________________________________________  External Attachment:    Type:   Image     Comment:   External Document

## 2010-08-07 NOTE — Medication Information (Signed)
Summary: rov  Anticoagulant Therapy  Managed by: Tula Nakayama, RN, BSN Referring MD: Aundra Dubin PCP: Rowe Clack MD Supervising MD: Johnsie Cancel MD, Collier Salina Indication 1: Atrial Fibrillation Lab Used: [facility] [place] Site: [place] INR POC 2.3 INR RANGE 2.0-3.0  Dietary changes: no    Health status changes: no    Bleeding/hemorrhagic complications: no    Recent/future hospitalizations: no    Any changes in medication regimen? no    Recent/future dental: no  Any missed doses?: no       Is patient compliant with meds? yes      Comments: Pt took 7.5mg s everyday except 5mg s on Mondays and Wednesdays. He was made aware that this is a 19% dose increase, thus to continue eating his veggies and call for any bleeding issues.   Allergies: 1)  ! Erythromycin  Anticoagulation Management History:      The patient is taking warfarin and comes in today for a routine follow up visit.  Positive risk factors for bleeding include presence of serious comorbidities.  Negative risk factors for bleeding include an age less than 35 years old.  The bleeding index is 'intermediate risk'.  Positive CHADS2 values include History of CHF, History of HTN, and History of Diabetes.  Negative CHADS2 values include Age > 45 years old.  His last INR was 1.4.  Anticoagulation responsible provider: Johnsie Cancel MD, Collier Salina.  INR POC: 2.3.  Cuvette Lot#: [id number].  Exp: 08/2011.    Anticoagulation Management Assessment/Plan:      The patient's current anticoagulation dose is Warfarin sodium 5 mg tabs: one daily or as directed.  The target INR is 2.0-3.0.  The next INR is due 07/17/2010.  Anticoagulation instructions were given to patient.  Results were reviewed/authorized by Tula Nakayama, RN, BSN.  He was notified by Tula Nakayama, RN, BSN.         Prior Anticoagulation Instructions: INR 1.4 (goal 2-3)  Take 1 1/2 tablets (7.5mg  total) tonight then take 1 tablet daily except take 1 1/2 tablets on  Wednesdays and Saturdays.  Return in 1 week on Thursday, December 29th at 8:15AM.  Current Anticoagulation Instructions: INR 2.3 Continue 7.5mg s everyday except 5mg s on Sundays and Wednesdays. Recheck in 2 weeks.

## 2010-08-07 NOTE — Miscellaneous (Signed)
Summary: [place]   Imported By: Bubba Hales 07/03/2010 10:40:45  _____________________________________________________________________  External Attachment:    Type:   Image     Comment:   External Document

## 2010-08-07 NOTE — Medication Information (Signed)
Summary: rov/sp  Anticoagulant Therapy  Managed by: Javier Glazier, PharmD Referring MD: Aundra Dubin PCP: Rowe Clack MD Supervising MD: Mare Ferrari, MD Indication 1: Atrial Fibrillation Lab Used: [facility] [place] Site: [place] INR POC 1.4 INR RANGE 2.0-3.0           Allergies: 1)  ! Erythromycin  Anticoagulation Management History:      The patient is taking warfarin and comes in today for a routine follow up visit.  Positive risk factors for bleeding include presence of serious comorbidities.  Negative risk factors for bleeding include an age less than 21 years old.  The bleeding index is 'intermediate risk'.  Positive CHADS2 values include History of CHF, History of HTN, and History of Diabetes.  Negative CHADS2 values include Age > 26 years old.  His last INR was 1.0 ratio and today's INR is 1.4.  Anticoagulation responsible provider: Mare Ferrari, MD.  INR POC: 1.4.  Cuvette Lot#: [id number].  Exp: 06/2011.    Anticoagulation Management Assessment/Plan:      The patient's current anticoagulation dose is Warfarin sodium 5 mg tabs: one daily or as directed.  The target INR is 2.0-3.0.  The next INR is due 07/03/2010.  Anticoagulation instructions were given to patient.  Results were reviewed/authorized by Javier Glazier, PharmD.         Prior Anticoagulation Instructions: INR 3.2  Continue same dose of 1 tablet every day except 1 1/2 tablets on Wednesday.  Eat extra greens today and tomorrow.  Recheck INR in 2 weeks.   Current Anticoagulation Instructions: INR 1.4 (goal 2-3)  Take 1 1/2 tablets (7.5mg  total) tonight then take 1 tablet daily except take 1 1/2 tablets on Wednesdays and Saturdays.  Return in 1 week on Thursday, December 29th at 8:15AM.

## 2010-08-07 NOTE — Miscellaneous (Signed)
Summary: Home Care Report  Home Care Report   Imported By: Zenovia Jarred 07/04/2010 15:46:24  _____________________________________________________________________  External Attachment:    Type:   Image     Comment:   External Document

## 2010-08-07 NOTE — Medication Information (Signed)
Summary: rov/tm  Anticoagulant Therapy  Managed by: Freddrick March, RN, BSN Referring MD: Aundra Dubin PCP: Rowe Clack MD Supervising MD: Johnsie Cancel MD, Collier Salina Indication 1: Atrial Fibrillation Lab Used: [facility] [place] Site: [place] INR POC 1.6 INR RANGE 2.0-3.0  Dietary changes: no    Health status changes: no    Bleeding/hemorrhagic complications: no    Recent/future hospitalizations: no    Any changes in medication regimen? no    Recent/future dental: no  Any missed doses?: no       Is patient compliant with meds? yes       Allergies: 1)  ! Erythromycin  Anticoagulation Management History:      The patient is taking warfarin and comes in today for a routine follow up visit.  Positive risk factors for bleeding include presence of serious comorbidities.  Negative risk factors for bleeding include an age less than 74 years old.  The bleeding index is 'intermediate risk'.  Positive CHADS2 values include History of CHF, History of HTN, and History of Diabetes.  Negative CHADS2 values include Age > 75 years old.  His last INR was 1.4.  Anticoagulation responsible provider: Johnsie Cancel MD, Collier Salina.  INR POC: 1.6.  Cuvette Lot#: [id number].  Exp: 08/2011.    Anticoagulation Management Assessment/Plan:      The patient's current anticoagulation dose is Warfarin sodium 5 mg tabs: one daily or as directed.  The target INR is 2.0-3.0.  The next INR is due 07/31/2010.  Anticoagulation instructions were given to patient.  Results were reviewed/authorized by Freddrick March, RN, BSN.  He was notified by Freddrick March RN.         Prior Anticoagulation Instructions: INR 2.3 Continue 7.5mg s everyday except 5mg s on Sundays and Wednesdays. Recheck in 2 weeks.   Current Anticoagulation Instructions: INR 1.6  Take 2 tablets today, then start taking 1.5 tablets daily except 1 tablet on Wednesdays.  Recheck in 2 weeks.

## 2010-08-07 NOTE — Medication Information (Signed)
Summary: rov/ewj  Anticoagulant Therapy  Managed by: Danella Penton, RN Referring MD: Aundra Dubin PCP: Rowe Clack MD Supervising MD: Darlin Coco Indication 1: Atrial Fibrillation Lab Used: [facility] [place] Site: [place] INR POC 3.8 INR RANGE 2.0-3.0  Dietary changes: yes       Details: Has been eating less greens  Health status changes: no    Bleeding/hemorrhagic complications: no    Recent/future hospitalizations: no    Any changes in medication regimen? no    Recent/future dental: no  Any missed doses?: no       Is patient compliant with meds? yes      Comments: Pt reports taking 2 tablets 5 days of the week and 1 tablet on 2 days of the week- that is a 20% increase over dose we thought he was taking.   Allergies: 1)  ! Erythromycin  Anticoagulation Management History:      The patient is taking warfarin and comes in today for a routine follow up visit.  Positive risk factors for bleeding include presence of serious comorbidities.  Negative risk factors for bleeding include an age less than 86 years old.  The bleeding index is 'intermediate risk'.  Positive CHADS2 values include History of CHF, History of HTN, and History of Diabetes.  Negative CHADS2 values include Age > 62 years old.  His last INR was 1.4.  Anticoagulation responsible provider: Darlin Coco.  INR POC: 3.8.  Cuvette Lot#: [id number].  Exp: 07/2011.    Anticoagulation Management Assessment/Plan:      The patient's current anticoagulation dose is Warfarin sodium 5 mg tabs: one daily or as directed.  The target INR is 2.0-3.0.  The next INR is due 08/15/2010.  Anticoagulation instructions were given to patient.  Results were reviewed/authorized by Danella Penton, RN.  He was notified by Porfirio Oar PharmD.         Prior Anticoagulation Instructions: INR 1.6  Take 2 tablets today, then start taking 1.5 tablets daily except 1 tablet on Wednesdays.  Recheck in 2 weeks.    Current  Anticoagulation Instructions: INR 3.8  Skip today's dose of Coumadin then change dose to 1 1/2 tablets every day.  Recheck INR in 2 weeks.

## 2010-08-08 NOTE — Letter (Signed)
Summary: [place] By: Marilynne Drivers 04/15/2010 08:56:46  _____________________________________________________________________  External Attachment:    Type:   Image     Comment:   External Document

## 2010-08-15 ENCOUNTER — Encounter (INDEPENDENT_AMBULATORY_CARE_PROVIDER_SITE_OTHER): Payer: Medicare Other | Admitting: Internal Medicine

## 2010-08-15 ENCOUNTER — Encounter (INDEPENDENT_AMBULATORY_CARE_PROVIDER_SITE_OTHER): Payer: Self-pay | Admitting: *Deleted

## 2010-08-15 ENCOUNTER — Encounter: Payer: Self-pay | Admitting: Internal Medicine

## 2010-08-15 ENCOUNTER — Encounter (INDEPENDENT_AMBULATORY_CARE_PROVIDER_SITE_OTHER): Payer: Medicare Other

## 2010-08-15 ENCOUNTER — Other Ambulatory Visit: Payer: Self-pay

## 2010-08-15 ENCOUNTER — Encounter: Payer: Self-pay | Admitting: Cardiology

## 2010-08-15 ENCOUNTER — Other Ambulatory Visit (INDEPENDENT_AMBULATORY_CARE_PROVIDER_SITE_OTHER): Payer: Medicare Other

## 2010-08-15 DIAGNOSIS — I2589 Other forms of chronic ischemic heart disease: Secondary | ICD-10-CM

## 2010-08-15 DIAGNOSIS — I2510 Atherosclerotic heart disease of native coronary artery without angina pectoris: Secondary | ICD-10-CM

## 2010-08-15 DIAGNOSIS — I4891 Unspecified atrial fibrillation: Secondary | ICD-10-CM

## 2010-08-15 DIAGNOSIS — I4892 Unspecified atrial flutter: Secondary | ICD-10-CM

## 2010-08-15 DIAGNOSIS — I5022 Chronic systolic (congestive) heart failure: Secondary | ICD-10-CM

## 2010-08-15 DIAGNOSIS — I10 Essential (primary) hypertension: Secondary | ICD-10-CM

## 2010-08-15 DIAGNOSIS — Z7901 Long term (current) use of anticoagulants: Secondary | ICD-10-CM

## 2010-08-15 DIAGNOSIS — I509 Heart failure, unspecified: Secondary | ICD-10-CM

## 2010-08-15 LAB — HEPATIC FUNCTION PANEL
AST: 20 U/L (ref 0–37)
Total Bilirubin: 0.3 mg/dL (ref 0.3–1.2)

## 2010-08-15 LAB — LIPID PANEL
Cholesterol: 168 mg/dL (ref 0–200)
LDL Cholesterol: 110 mg/dL — ABNORMAL HIGH (ref 0–99)
Triglycerides: 152 mg/dL — ABNORMAL HIGH (ref 0.0–149.0)
VLDL: 30.4 mg/dL (ref 0.0–40.0)

## 2010-08-21 NOTE — Medication Information (Signed)
Summary: Coumadin Clinic  Anticoagulant Therapy  Managed by: Tula Nakayama, RN, BSN Referring MD: Aundra Dubin PCP: Rowe Clack MD Supervising MD: Stanford Breed MD, Aaron Edelman Indication 1: Atrial Fibrillation Lab Used: [facility] [place] Site: [place] INR POC 1.7 INR RANGE 2.0-3.0  Dietary changes: yes       Details: has been eating more leafy veggies  Health status changes: no    Bleeding/hemorrhagic complications: no    Recent/future hospitalizations: no    Any changes in medication regimen? yes       Details: Lasix decreased to 20mg s from 40mg s  Recent/future dental: yes     Details: Pending extractions, no date set  Any missed doses?: no       Is patient compliant with meds? yes      Comments: Seeing Dr Rayann Heman today  Allergies: 1)  ! Erythromycin  Anticoagulation Management History:      The patient is taking warfarin and comes in today for a routine follow up visit.  Positive risk factors for bleeding include presence of serious comorbidities.  Negative risk factors for bleeding include an age less than 16 years old.  The bleeding index is 'intermediate risk'.  Positive CHADS2 values include History of CHF, History of HTN, and History of Diabetes.  Negative CHADS2 values include Age > 68 years old.  His last INR was 1.4.  Anticoagulation responsible provider: Stanford Breed MD, Aaron Edelman.  INR POC: 1.7.  Cuvette Lot#: [id number].  Exp: 07/2011.    Anticoagulation Management Assessment/Plan:      The patient's current anticoagulation dose is Warfarin sodium 5 mg tabs: one daily or as directed.  The target INR is 2.0-3.0.  The next INR is due 08/28/2010.  Anticoagulation instructions were given to patient.  Results were reviewed/authorized by Tula Nakayama, RN, BSN.  He was notified by Tula Nakayama, RN, BSN.         Prior Anticoagulation Instructions: INR 3.8  Skip today's dose of Coumadin then change dose to 1 1/2 tablets every day.  Recheck INR in 2 weeks.   Current  Anticoagulation Instructions: INR 1.7 Today take 2 pills then change dose to 1.5 pills everyday except 2 pills on Fridays. Recheck in 2 weeks.

## 2010-08-21 NOTE — Assessment & Plan Note (Signed)
Summary: defib check/mdt/amber/kwb   Visit Type:  Follow-up Referring Provider:  Dr Aundra Dubin Primary Provider:  Rowe Clack MD   History of Present Illness: The patient presents today for routine electrophysiology followup. He reports doing very well since his recent ICD imlantation.  He feels that with sinus rhythm post [facility], his dizziness has resolved and exercise tolerance has slightly improved.   The patient denies symptoms of palpitations, chest pain, shortness of breath, orthopnea, PND, lower extremity edema, dizziness, presyncope, syncope, or neurologic sequela. The patient is tolerating medications without difficulties and is otherwise without complaint today.   Current Medications (verified): 1)  Px Enteric Aspirin 81 Mg Tbec (Aspirin) .... Take One Daily 2)  Coreg 12.5 Mg Tabs (Carvedilol) .... One Twice A Day 3)  Furosemide 40 Mg Tabs (Furosemide) .... Take One-Half Tablet  By Mouth Once Daily 4)  Glipizide 5 Mg Xr24h-Tab (Glipizide) .Marland Kitchen.. 1 By Mouth Two Times A Day 5)  Lisinopril 5 Mg Tabs (Lisinopril) .... One Twice A Day 6)  Lipitor 80 Mg Tabs (Atorvastatin Calcium) .... One Daily 7)  Multivitamins  Tabs (Multiple Vitamin) .... Take 1 By Mouth Once Daily 8)  B Complex 100  Tabs (B Complex Vitamins) .... Take 1 By Mouth Once Daily As Needed 9)  Spironolactone 25 Mg Tabs (Spironolactone) .... Take 1  By Mouth Once Daily 10)  Warfarin Sodium 5 Mg Tabs (Warfarin Sodium) .... One Daily or As Directed 11)  Onetouch Ultra Blue  Strp (Glucose Blood) .... Use As Directed Once Daily 12)  Hydralazine Hcl 25 Mg Tabs (Hydralazine Hcl) .... One-Half Three Times A Day 13)  Isosorbide Dinitrate 10 Mg Tabs (Isosorbide Dinitrate) .... One Three Times A Day  Allergies: 1)  ! Erythromycin  Past History:  Past Medical History: Reviewed history from 06/05/2010 and no changes required. 1. CAD: LHC (5/11) with 50-60% mid LAD, diffuse moderate distal LAD up to 60-70%, small OM2 subtotally  occluded, moderate to severe diffuse distal PLOM disease, severe diffuse distal RCA, PDA, and PLV disease.  Coronaries were ectatic with slow flow.  Patient was started on Plavix given concern for thrombus formation in the ectatic coronaries.  He stopped Plavix after starting coumadin with atrial flutter.  2. CHF: Systolic dysfunction, likely mixed ischemic/nonischemic.  Echo (5/11) with EF 15-20%, severe global hypokinesis, mild MR, moderate diastolic dysfunction, mildly decreased RV systolic function.  RHC (5/11) with mean RA 14 mmHg, PA 38/26, mean PCWP 25 mmHg, CI 1.8.  Repeat echo (8/11) with EF 0000000, severe diastolic dysfunction, mild MR, RV normal size and systolic function.  Per official read, cannot rule out apical clot but it does appear to be only trabeculations.  TEE (9/11): EF 15% with global hypokinesis, mild to moderate MR, no LV thrombus and no LAA thrombus.  3. Type II diabetes, uncontrolled 4. HTN 5. Smoker 6. severe diabetic neuropathy -with chronic gait dysfunction 7. History of left foot ulcer with poor healing  8. Atrial flutter s/p DCCV 9/11 9. Medtronic dual chamber ICD with Optivol (11/11)  Past Surgical History: s/p ICD (MDT) by Greggory Brandy 2011  Social History: Reviewed history from 04/09/2010 and no changes required. Pt lives in [place].  Smoker, about 1/4 ppd.  no alcohol Former [employer] married, lives with wife  Retired Barrister's clerk    Review of Systems       All systems are reviewed and negative except as listed in the HPI.   Vital Signs:  Patient profile:   60 year old  male Height:      76 inches Pulse rate:   72 / minute BP sitting:   100 / 70  (right arm)  Vitals Entered By: Margaretmary Bayley CMA (August 15, 2010 9:03 AM)  Physical Exam  General:  Well developed, well nourished, in no acute distress. Head:  normocephalic and atraumatic Eyes:  PERRLA/EOM intact; conjunctiva and lids normal. Mouth:  Teeth, gums and palate normal. Oral mucosa  normal. Neck:  Neck supple, no JVD. No masses, thyromegaly or abnormal cervical nodes. Chest Wall:  ICD pocket is well healed Lungs:  Clear bilaterally to auscultation and percussion. Heart:  Non-displaced PMI, chest non-tender; regular rate and rhythm, S1, S2 without murmurs, rubs or gallops. Carotid upstroke normal, no bruit. I am unable to palpate his pedal pulses but feet are warm. No edema, no varicosities. Abdomen:  Bowel sounds positive; abdomen soft and non-tender without masses, organomegaly, or hernias noted. No hepatosplenomegaly. Msk:  in a wheelchair today Extremities:  No clubbing or cyanosis. Neurologic:  Alert and oriented x 3.    ICD Specifications Following MD:  Thompson Grayer, MD     ICD Vendor:  Medtronic     ICD Model Number:  [id number]     ICD Serial Number:  [id number] ICD DOI:  05/13/2010     ICD Implanting MD:  Thompson Grayer, MD  Lead 1:    Location: RA     DOI: 05/13/2010     Model #: [id number]     Serial #: [id number]     Status: active Lead 2:    Location: RV     DOI: 05/13/2010     Model #: [id number]     Serial #[id number]     Status: active  Indications::  ICM   ICD Follow Up ICD Dependent:  No      Episodes Coumadin:  Yes  Brady Parameters Mode MVP     Lower Rate Limit:  60     Upper Rate Limit 130 PAV 200     Sensed AV Delay:  180  Tachy Zones VF:  200     MD Comments:  refer to scanned report  Impression & Recommendations:  Problem # 1:  CHRONIC SYSTOLIC HEART FAILURE (123456) doing well  normal ICD function no changes today  see scanned report  Problem # 2:  ATRIAL FLUTTER (ICD-427.32) maintaining sinus continue coumadin  Problem # 3:  TOBACCO USE, QUIT (ICD-V15.82) encouraged to quit today  Problem # 4:  CORONARY ARTERY DISEASE (ICD-414.00) no symptoms of ischemia no changes  Problem # 5:  ESSENTIAL HYPERTENSION, BENIGN (ICD-401.1) controlled  Patient Instructions: 1)  Your physician wants you to follow-up in: 3 months in the device  clinic  You will receive a reminder letter in the mail two months in advance. If you don't receive a letter, please call our office to schedule the follow-up appointment. 2)  Your physician recommends that you continue on your current medications as directed. Please refer to the Current Medication list given to you today.

## 2010-08-25 DIAGNOSIS — I4892 Unspecified atrial flutter: Secondary | ICD-10-CM

## 2010-08-27 ENCOUNTER — Telehealth: Payer: Self-pay | Admitting: Cardiology

## 2010-08-27 NOTE — Cardiovascular Report (Signed)
Summary: Office Visit   Office Visit   Imported By: Sallee Provencal 08/19/2010 15:41:05  _____________________________________________________________________  External Attachment:    Type:   Image     Comment:   External Document

## 2010-08-28 ENCOUNTER — Encounter: Payer: Self-pay | Admitting: Cardiology

## 2010-08-28 ENCOUNTER — Telehealth: Payer: Self-pay | Admitting: Cardiology

## 2010-08-28 ENCOUNTER — Encounter (INDEPENDENT_AMBULATORY_CARE_PROVIDER_SITE_OTHER): Payer: Medicare Other

## 2010-08-28 DIAGNOSIS — Z7901 Long term (current) use of anticoagulants: Secondary | ICD-10-CM

## 2010-08-28 DIAGNOSIS — I4891 Unspecified atrial fibrillation: Secondary | ICD-10-CM

## 2010-08-28 LAB — CONVERTED CEMR LAB: POC INR: 1.7

## 2010-09-02 NOTE — Cardiovascular Report (Signed)
Summary: Office Visit   Office Visit   Imported By: Sallee Provencal 08/27/2010 15:43:17  _____________________________________________________________________  External Attachment:    Type:   Image     Comment:   External Document

## 2010-09-02 NOTE — Progress Notes (Signed)
Summary: pt needs Rx sent to [facility] hospital  Phone Note Call from Patient Call back at Home Phone [phone]   Caller: Patient Reason for Call: Talk to Nurse, Talk to Doctor Summary of Call: please fax Rx to Dr. Frederich Balding from the [facility] hospital fax# [phone] for a 37month supply for rosuzastatin 40mg  also please send a copy of Dx so they will know what he is taking it for  Initial call taken by: Shelda Pal,  August 27, 2010 2:20 PM  Follow-up for Phone Call        faxed to [facility]     Prescriptions: CRESTOR 40 MG TABS (ROSUVASTATIN CALCIUM) one daily  #90 x 3   Entered by:   Desiree Lucy, RN, BSN   Authorized by:   [name], MD   Signed by:   Desiree Lucy, RN, BSN on 08/27/2010   Method used:   Print then Give to Patient   RxID:   [id number]   Appended Document: pt needs Rx sent to [facility] hospital faxed to [phone]  Appended Document: pt needs Rx sent to [facility] hospital diagnosis 272.4  428.22  414.01  427.[place]

## 2010-09-02 NOTE — Medication Information (Signed)
Summary: rov/tm  Anticoagulant Therapy  Managed by: Danella Penton, RN Referring MD: Aundra Dubin PCP: Rowe Clack MD Supervising MD: Stanford Breed MD, Aaron Edelman Indication 1: Atrial Fibrillation Lab Used: [facility] [place] Site: [place] INR POC 1.7 INR RANGE 2.0-3.0  Dietary changes: no    Health status changes: no    Bleeding/hemorrhagic complications: no    Recent/future hospitalizations: no    Any changes in medication regimen? no    Recent/future dental: no  Any missed doses?: no       Is patient compliant with meds? yes       Allergies: 1)  ! Erythromycin  Anticoagulation Management History:      Positive risk factors for bleeding include presence of serious comorbidities.  Negative risk factors for bleeding include an age less than 58 years old.  The bleeding index is 'intermediate risk'.  Positive CHADS2 values include History of CHF, History of HTN, and History of Diabetes.  Negative CHADS2 values include Age > 24 years old.  His last INR was 1.4.  Anticoagulation responsible provider: Stanford Breed MD, Aaron Edelman.  INR POC: 1.7.  Exp: 07/2011.    Anticoagulation Management Assessment/Plan:      The patient's current anticoagulation dose is Warfarin sodium 5 mg tabs: one daily or as directed.  The target INR is 2.0-3.0.  The next INR is due 09/11/2010.  Anticoagulation instructions were given to patient.  Results were reviewed/authorized by Danella Penton, RN.  He was notified by Danella Penton, RN.         Prior Anticoagulation Instructions: INR 1.7 Today take 2 pills then change dose to 1.5 pills everyday except 2 pills on Fridays. Recheck in 2 weeks.   Current Anticoagulation Instructions: Take 2 tablets today total (10 mg.)  Then begin taking 1 1/2 tablets every day, except take 2 tablets on Mondays, Wednesdays, and Fridays.  Recheck in 2 weeks.

## 2010-09-02 NOTE — Progress Notes (Signed)
Summary: pt request call  Phone Note Call from Patient Call back at Home Phone [phone]   Caller: Patient Summary of Call: Pt request call Initial call taken by: Delsa Sale,  August 28, 2010 3:25 PM  Follow-up for Phone Call        I talked with pt

## 2010-09-02 NOTE — Progress Notes (Signed)
Summary: request call  Phone Note Call from Patient Call back at Home Phone [phone]   Reason for Call: Talk to Nurse Summary of Call: Pt request call Initial call taken by: Delsa Sale,  August 27, 2010 11:07 AM  Follow-up for Phone Call        pt states he can get Rosuvastatin 40mg  from the [place] --I  will mail pt an RX for Rosuvatatin 40mg  #90 take one tablet daily    New/Updated Medications: CRESTOR 40 MG TABS (ROSUVASTATIN CALCIUM) one daily   Current Medications (verified): 1)  Px Enteric Aspirin 81 Mg Tbec (Aspirin) .... Take One Daily 2)  Coreg 12.5 Mg Tabs (Carvedilol) .... One Twice A Day 3)  Furosemide 40 Mg Tabs (Furosemide) .... Take One-Half Tablet  By Mouth Once Daily 4)  Glipizide 5 Mg Xr24h-Tab (Glipizide) .Marland Kitchen.. 1 By Mouth Two Times A Day 5)  Lisinopril 5 Mg Tabs (Lisinopril) .... One Twice A Day 6)  Multivitamins  Tabs (Multiple Vitamin) .... Take 1 By Mouth Once Daily 7)  B Complex 100  Tabs (B Complex Vitamins) .... Take 1 By Mouth Once Daily As Needed 8)  Spironolactone 25 Mg Tabs (Spironolactone) .... Take 1  By Mouth Once Daily 9)  Warfarin Sodium 5 Mg Tabs (Warfarin Sodium) .... One Daily or As Directed 10)  Onetouch Ultra Blue  Strp (Glucose Blood) .... Use As Directed Once Daily 11)  Hydralazine Hcl 25 Mg Tabs (Hydralazine Hcl) .... One-Half Three Times A Day 12)  Isosorbide Dinitrate 10 Mg Tabs (Isosorbide Dinitrate) .... One Three Times A Day 13)  Crestor 40 Mg Tabs (Rosuvastatin Calcium) .... One Daily  Allergies: 1)  ! Erythromycin

## 2010-09-04 ENCOUNTER — Telehealth: Payer: Self-pay | Admitting: Cardiology

## 2010-09-11 NOTE — Progress Notes (Signed)
Summary: Pt request call  Phone Note Call from Patient Call back at Home Phone [phone]   Caller: Patient Summary of Call: Pt request call didn't leave any information just request call Initial call taken by: Delsa Sale,  September 04, 2010 9:54 AM  Follow-up for Phone Call        [facility] or other [facility]. New fax number is :[phone] Attention: Dr. Mingo Amber Nurse: Arnoldo Morale  and her # [phone] Ext [phone]  Follow-up by: Joelyn Oms RN,  September 04, 2010 10:14 AM     Appended Document: Pt request call Please check to make sure that this was done.   Appended Document: Pt request call I refaxed prescription for Rosuvastatin 40mg  #90 1 daily 3 refills to [phone] attn Dr Lucretia Roers Rivas--pt is aware that I have refaxed this prescription --orginal prescription has been sent to HIM to be scanned into EMR  Appended Document: Pt request call Fax transmission was confirmed to [phone]

## 2010-09-12 ENCOUNTER — Encounter: Payer: Self-pay | Admitting: Cardiovascular Disease

## 2010-09-12 ENCOUNTER — Encounter (INDEPENDENT_AMBULATORY_CARE_PROVIDER_SITE_OTHER): Payer: Medicare Other

## 2010-09-12 DIAGNOSIS — I4891 Unspecified atrial fibrillation: Secondary | ICD-10-CM

## 2010-09-12 DIAGNOSIS — Z7901 Long term (current) use of anticoagulants: Secondary | ICD-10-CM

## 2010-09-12 LAB — CONVERTED CEMR LAB: POC INR: 2.2

## 2010-09-16 LAB — GLUCOSE, CAPILLARY
Glucose-Capillary: 141 mg/dL — ABNORMAL HIGH (ref 70–99)
Glucose-Capillary: 148 mg/dL — ABNORMAL HIGH (ref 70–99)
Glucose-Capillary: 152 mg/dL — ABNORMAL HIGH (ref 70–99)
Glucose-Capillary: 260 mg/dL — ABNORMAL HIGH (ref 70–99)

## 2010-09-16 LAB — APTT: aPTT: 38 seconds — ABNORMAL HIGH (ref 24–37)

## 2010-09-16 LAB — SURGICAL PCR SCREEN
MRSA, PCR: NEGATIVE
Staphylococcus aureus: NEGATIVE

## 2010-09-16 NOTE — Medication Information (Signed)
Summary: rov/pc  Anticoagulant Therapy  Managed by: Danella Penton, RN Referring MD: Aundra Dubin PCP: Rowe Clack MD Supervising MD: Johnsie Cancel MD, Collier Salina Indication 1: Atrial Fibrillation Lab Used: [facility] [place] Site: [place] INR POC 2.2 INR RANGE 2.0-3.0  Dietary changes: no    Health status changes: no    Bleeding/hemorrhagic complications: no    Recent/future hospitalizations: no    Any changes in medication regimen? no    Recent/future dental: no  Any missed doses?: no       Is patient compliant with meds? yes       Allergies: 1)  ! Erythromycin  Anticoagulation Management History:      The patient is taking warfarin and comes in today for a routine follow up visit.  Positive risk factors for bleeding include presence of serious comorbidities.  Negative risk factors for bleeding include an age less than 78 years old.  The bleeding index is 'intermediate risk'.  Positive CHADS2 values include History of CHF, History of HTN, and History of Diabetes.  Negative CHADS2 values include Age > 65 years old.  His last INR was 1.4.  Anticoagulation responsible provider: Johnsie Cancel MD, Collier Salina.  INR POC: 2.2.  Cuvette Lot#: [id number].  Exp: 09/2011.    Anticoagulation Management Assessment/Plan:      The patient's current anticoagulation dose is Warfarin sodium 5 mg tabs: one daily or as directed.  The target INR is 2.0-3.0.  The next INR is due 10/10/2010.  Anticoagulation instructions were given to patient.  Results were reviewed/authorized by Danella Penton, RN.  He was notified by Danella Penton, RN.         Prior Anticoagulation Instructions: Take 2 tablets today total (10 mg.)  Then begin taking 1 1/2 tablets every day, except take 2 tablets on Mondays, Wednesdays, and Fridays.  Recheck in 2 weeks.  Current Anticoagulation Instructions: INR 2.2 Continue taking 1 1/2 tablets every day, except take 2 tablets on Mondays, Wednesdays, and Fridays. Recheck in 4 weeks.

## 2010-09-18 ENCOUNTER — Ambulatory Visit (INDEPENDENT_AMBULATORY_CARE_PROVIDER_SITE_OTHER): Payer: Medicare Other | Admitting: Cardiology

## 2010-09-18 ENCOUNTER — Other Ambulatory Visit: Payer: Self-pay | Admitting: Cardiology

## 2010-09-18 ENCOUNTER — Encounter: Payer: Self-pay | Admitting: Cardiology

## 2010-09-18 DIAGNOSIS — R0602 Shortness of breath: Secondary | ICD-10-CM

## 2010-09-18 DIAGNOSIS — I5022 Chronic systolic (congestive) heart failure: Secondary | ICD-10-CM

## 2010-09-18 DIAGNOSIS — I2510 Atherosclerotic heart disease of native coronary artery without angina pectoris: Secondary | ICD-10-CM

## 2010-09-18 LAB — BASIC METABOLIC PANEL
Chloride: 106 mEq/L (ref 96–112)
Potassium: 4.7 mEq/L (ref 3.5–5.1)
Sodium: 140 mEq/L (ref 135–145)

## 2010-09-18 LAB — BRAIN NATRIURETIC PEPTIDE: Pro B Natriuretic peptide (BNP): 105.6 pg/mL — ABNORMAL HIGH (ref 0.0–100.0)

## 2010-09-18 LAB — GLUCOSE, CAPILLARY: Glucose-Capillary: 164 mg/dL — ABNORMAL HIGH (ref 70–99)

## 2010-09-23 ENCOUNTER — Encounter: Payer: Self-pay | Admitting: Cardiology

## 2010-09-23 LAB — POCT I-STAT 3, VENOUS BLOOD GAS (G3P V)
O2 Saturation: 55 %
TCO2: 26 mmol/L (ref 0–100)

## 2010-09-23 LAB — GLUCOSE, CAPILLARY
Glucose-Capillary: 134 mg/dL — ABNORMAL HIGH (ref 70–99)
Glucose-Capillary: 146 mg/dL — ABNORMAL HIGH (ref 70–99)
Glucose-Capillary: 149 mg/dL — ABNORMAL HIGH (ref 70–99)
Glucose-Capillary: 173 mg/dL — ABNORMAL HIGH (ref 70–99)
Glucose-Capillary: 189 mg/dL — ABNORMAL HIGH (ref 70–99)
Glucose-Capillary: 212 mg/dL — ABNORMAL HIGH (ref 70–99)
Glucose-Capillary: 215 mg/dL — ABNORMAL HIGH (ref 70–99)
Glucose-Capillary: 246 mg/dL — ABNORMAL HIGH (ref 70–99)
Glucose-Capillary: 272 mg/dL — ABNORMAL HIGH (ref 70–99)
Glucose-Capillary: 289 mg/dL — ABNORMAL HIGH (ref 70–99)
Glucose-Capillary: 266 mg/dL — ABNORMAL HIGH (ref 70–99)
Glucose-Capillary: 143 mg/dL — ABNORMAL HIGH (ref 70–99)

## 2010-09-23 LAB — D-DIMER, QUANTITATIVE: D-Dimer, Quant: 1 ug/mL-FEU — ABNORMAL HIGH (ref 0.00–0.48)

## 2010-09-23 LAB — BASIC METABOLIC PANEL
BUN: 23 mg/dL (ref 6–23)
BUN: 25 mg/dL — ABNORMAL HIGH (ref 6–23)
BUN: 27 mg/dL — ABNORMAL HIGH (ref 6–23)
BUN: 20 mg/dL (ref 6–23)
BUN: 22 mg/dL (ref 6–23)
CO2: 29 mEq/L (ref 19–32)
CO2: 30 mEq/L (ref 19–32)
CO2: 27 mEq/L (ref 19–32)
Calcium: 8.9 mg/dL (ref 8.4–10.5)
Calcium: 9.4 mg/dL (ref 8.4–10.5)
Calcium: 8.8 mg/dL (ref 8.4–10.5)
Chloride: 100 mEq/L (ref 96–112)
Chloride: 104 mEq/L (ref 96–112)
Chloride: 104 mEq/L (ref 96–112)
Creatinine, Ser: 1.35 mg/dL (ref 0.4–1.5)
GFR calc Af Amer: >60 mL/min (ref >60)
GFR calc non Af Amer: 46 mL/min — ABNORMAL LOW (ref >60)
GFR calc non Af Amer: 53 mL/min — ABNORMAL LOW (ref >60)
GFR calc non Af Amer: 48 mL/min — ABNORMAL LOW (ref >60)
Glucose, Bld: 175 mg/dL — ABNORMAL HIGH (ref 70–99)
Glucose, Bld: 199 mg/dL — ABNORMAL HIGH (ref 70–99)
Glucose, Bld: 170 mg/dL — ABNORMAL HIGH (ref 70–99)
Glucose, Bld: 208 mg/dL — ABNORMAL HIGH (ref 70–99)
Potassium: 3.9 mEq/L (ref 3.5–5.1)
Potassium: 3.9 mEq/L (ref 3.5–5.1)
Potassium: 4.1 mEq/L (ref 3.5–5.1)
Sodium: 138 mEq/L (ref 135–145)
Sodium: 139 mEq/L (ref 135–145)
Sodium: 139 mEq/L (ref 135–145)

## 2010-09-23 LAB — POCT I-STAT 3, ART BLOOD GAS (G3+)
O2 Saturation: 95 %
pCO2 arterial: 35.3 mmHg (ref 35.0–45.0)
pH, Arterial: 7.432 (ref 7.350–7.450)

## 2010-09-23 LAB — CBC
HCT: 39.8 % (ref 39.0–52.0)
HCT: 43.9 % (ref 39.0–52.0)
Hemoglobin: 14.6 g/dL (ref 13.0–17.0)
MCHC: 33.2 g/dL (ref 30.0–36.0)
MCHC: 32.9 g/dL (ref 30.0–36.0)
MCV: 85.9 fL (ref 78.0–100.0)
Platelets: 264 10*3/uL (ref 150–400)
Platelets: 247 10*3/uL (ref 150–400)
RBC: 5.12 MIL/uL (ref 4.22–5.81)
RDW: 13.4 % (ref 11.5–15.5)
RDW: 12.7 % (ref 11.5–15.5)
RDW: 13.5 % (ref 11.5–15.5)
WBC: 7 10*3/uL (ref 4.0–10.5)

## 2010-09-23 LAB — COMPREHENSIVE METABOLIC PANEL
ALT: 19 U/L (ref 0–53)
Alkaline Phosphatase: 75 U/L (ref 39–117)
BUN: 19 mg/dL (ref 6–23)
CO2: 25 mEq/L (ref 19–32)
Calcium: 9.1 mg/dL (ref 8.4–10.5)
GFR calc non Af Amer: 48 mL/min — ABNORMAL LOW (ref >60)
Glucose, Bld: 272 mg/dL — ABNORMAL HIGH (ref 70–99)
Potassium: 4.6 mEq/L (ref 3.5–5.1)
Sodium: 145 mEq/L (ref 135–145)
Total Protein: 7.1 g/dL (ref 6.0–8.3)

## 2010-09-23 LAB — DIFFERENTIAL
Basophils Relative: 3 % — ABNORMAL HIGH (ref 0–1)
Eosinophils Absolute: 0 10*3/uL (ref 0.0–0.7)
Neutro Abs: 5 10*3/uL (ref 1.7–7.7)
Neutrophils Relative %: 63 % (ref 43–77)

## 2010-09-23 LAB — LIPID PANEL
Cholesterol: 232 mg/dL — ABNORMAL HIGH (ref 0–200)
LDL Cholesterol: 177 mg/dL — ABNORMAL HIGH (ref 0–99)

## 2010-09-23 LAB — HEMOGLOBIN A1C: Hgb A1c MFr Bld: 11.5 % — ABNORMAL HIGH (ref <5.7)

## 2010-09-23 LAB — MAGNESIUM: Magnesium: 2 mg/dL (ref 1.5–2.5)

## 2010-09-23 LAB — CARDIAC PANEL(CRET KIN+CKTOT+MB+TROPI)
CK, MB: 6.2 ng/mL (ref 0.3–4.0)
CK, MB: 6.3 ng/mL (ref 0.3–4.0)
CK, MB: 7.4 ng/mL (ref 0.3–4.0)
Relative Index: 3.4 — ABNORMAL HIGH (ref 0.0–2.5)
Relative Index: 3.8 — ABNORMAL HIGH (ref 0.0–2.5)
Total CK: 181 U/L (ref 7–232)
Total CK: 187 U/L (ref 7–232)
Troponin I: 0.13 ng/mL — ABNORMAL HIGH (ref 0.00–0.06)

## 2010-09-23 LAB — PROTIME-INR
INR: 1.09 (ref 0.00–1.49)
Prothrombin Time: 14 seconds (ref 11.6–15.2)

## 2010-09-23 NOTE — Assessment & Plan Note (Addendum)
Summary: 3 MONTH ROV.SL/AMD   Referring Provider:  Dr Aundra Dubin Primary Provider:  Rowe Clack MD  CC:  check up.Marland KitchenMarland Kitchenpt has not taken crestor since november due to [place] process.  History of Present Illness: 60 yo AAM with history of CAD and mixed ischemic/nonischemic cardiomyopathy (EF 15%), NYHA Class III CHF, and diabetes who presents for followup.  He initially was hospitalized in 5/11 with acute decompensated CHF after several days of shortness of breath, orthopnea, and PND.   He was diuresed and left/right heart catheterization was done.  Left heart cath showed large, ectatic coronaries with slow flow and moderate to severe diffuse distal vessel disease (consistent with diabetes) without good interventional options.  EF was 15-20% by echo.  He was put on milrinone and further diuresed for several more days.  He was titrated off milrinone and discharged on an oral medication regimen.  He later developed atrial flutter requiring cardioversion.  He remains in NSR today after cardioversion.  Finally, patient had Medtronic ICD placed in 11/11.   Patient is stable today.  He continues to have his chronic gait difficulty from diabetic neuropathy.  He is able to get around his house without dyspnea.  No chest pain.  Weight is stable compared to prior appointment.  He finally was able to get Crestor from the [place].   Optivol was checked today showing thoracic impedance trending upwards (good) and well below threshold.   Labs (9/11): LDL 61, HDL 26, K 4.8, creatinine 1.6 Labs (10/11): K 4.3, creatinine 1.3, BNP 165 Labs (11/11): K 4.9, creatinine 1.6 Labs (12/11): K 4.8, creatinine 1.7 Labs (2/12): LDL 110, HDL 28  ECG: NSR, old inferior MI and old anterior MI, left axis deviation  Current Medications (verified): 1)  Px Enteric Aspirin 81 Mg Tbec (Aspirin) .... Take One Daily 2)  Coreg 12.5 Mg Tabs (Carvedilol) .... One Twice A Day 3)  Furosemide 40 Mg Tabs (Furosemide) .... Take One-Half Tablet   By Mouth Once Daily 4)  Glipizide 5 Mg Xr24h-Tab (Glipizide) .Marland Kitchen.. 1 By Mouth Two Times A Day 5)  Lisinopril 5 Mg Tabs (Lisinopril) .... One Twice A Day 6)  Multivitamins  Tabs (Multiple Vitamin) .... Take 1 By Mouth Once Daily 7)  B Complex 100  Tabs (B Complex Vitamins) .... Take 1 By Mouth Once Daily As Needed 8)  Spironolactone 25 Mg Tabs (Spironolactone) .... Take 1  By Mouth Once Daily 9)  Warfarin Sodium 5 Mg Tabs (Warfarin Sodium) .... One Daily or As Directed 10)  Onetouch Ultra Blue  Strp (Glucose Blood) .... Use As Directed Once Daily 11)  Hydralazine Hcl 25 Mg Tabs (Hydralazine Hcl) .... One-Half Three Times A Day 12)  Isosorbide Dinitrate 10 Mg Tabs (Isosorbide Dinitrate) .... One Three Times A Day 13)  Crestor 40 Mg Tabs (Rosuvastatin Calcium) .... Havent Not Taken Since November One Daily  Allergies: 1)  ! Erythromycin  Past History:  Past Medical History: Reviewed history from 06/05/2010 and no changes required. 1. CAD: LHC (5/11) with 50-60% mid LAD, diffuse moderate distal LAD up to 60-70%, small OM2 subtotally occluded, moderate to severe diffuse distal PLOM disease, severe diffuse distal RCA, PDA, and PLV disease.  Coronaries were ectatic with slow flow.  Patient was started on Plavix given concern for thrombus formation in the ectatic coronaries.  He stopped Plavix after starting coumadin with atrial flutter.  2. CHF: Systolic dysfunction, likely mixed ischemic/nonischemic.  Echo (5/11) with EF 15-20%, severe global hypokinesis, mild MR, moderate diastolic  dysfunction, mildly decreased RV systolic function.  RHC (5/11) with mean RA 14 mmHg, PA 38/26, mean PCWP 25 mmHg, CI 1.8.  Repeat echo (8/11) with EF 0000000, severe diastolic dysfunction, mild MR, RV normal size and systolic function.  Per official read, cannot rule out apical clot but it does appear to be only trabeculations.  TEE (9/11): EF 15% with global hypokinesis, mild to moderate MR, no LV thrombus and no LAA  thrombus.  3. Type II diabetes, uncontrolled 4. HTN 5. Smoker 6. severe diabetic neuropathy -with chronic gait dysfunction 7. History of left foot ulcer with poor healing  8. Atrial flutter s/p DCCV 9/11 9. Medtronic dual chamber ICD with Optivol (11/11)  Family History: Reviewed history from 11/28/2009 and no changes required. Family History of Colon CA 1st degree relative <60 9other relative) Heart disease (grandparent) Stroke (other relative) dm only in secondary relatives  Social History: Reviewed history from 04/09/2010 and no changes required. Pt lives in [place].  Smoker, about 1/4 ppd.  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   Vital Signs:  Patient profile:   60 year old male Height:      76 inches Weight:      248 pounds BMI:     30.30 Pulse rate:   74 / minute Resp:     14 per minute BP sitting:   96 / 69  (left arm)  Vitals Entered By: Burnett Kanaris (September 18, 2010 8:28 AM)  Physical Exam  General:  Well developed, well nourished, in no acute distress. Neck:  Neck supple, no JVD. No masses, thyromegaly or abnormal cervical nodes. Lungs:  Clear bilaterally to auscultation and percussion. Heart:  Non-displaced PMI, chest non-tender; regular rate and rhythm, S1, S2 without murmurs, rubs or gallops. Carotid upstroke normal, no bruit. I am unable to palpate his pedal pulses but feet are warm. No edema, no varicosities. Abdomen:  Bowel sounds positive; abdomen soft and non-tender without masses, organomegaly, or hernias noted. No hepatosplenomegaly. Extremities:  No clubbing or cyanosis. Neurologic:  Alert and oriented x 3. Psych:  Normal affect.    ICD Specifications Following MD:  Thompson Grayer, MD     ICD Vendor:  Medtronic     ICD Model Number:  [id number]     ICD Serial Number:  [id number] ICD DOI:  05/13/2010     ICD Implanting MD:   Thompson Grayer, MD  Lead 1:    Location: RA     DOI: 05/13/2010     Model #: [id number]     Serial #: [id number]     Status: active Lead 2:    Location: RV     DOI: 05/13/2010     Model #: [id number]     Serial #: [id number]     Status: active  Indications::  ICM   ICD Follow Up ICD Dependent:  No      Episodes Coumadin:  Yes  Brady Parameters Mode MVP     Lower Rate Limit:  60     Upper Rate Limit 130 PAV 200     Sensed AV Delay:  180  Tachy Zones VF:  200     Impression & Recommendations:  Problem # 1:  ATRIAL FLUTTER (ICD-427.32) Maintaining NSR.  Continue warfarin.  Ablation would be next step if it recurs.    Problem #  2:  CORONARY ATHEROSCLEROSIS NATIVE CORONARY ARTERY (ICD-414.01) Diffuse moderate to severe distal vessel disease on cath with ectatic coronaries with slow flow.  No interventional target.  He has never had chest pain. Continue ASA, statin, ACEI, beta blocker..  Problem # 3:  CHRONIC SYSTOLIC HEART FAILURE (123456) Systolic CHF.  This may be multifactorial, due to significant distal vessel CAD as well as a history of poorly controlled diabetes.  He has NYHA class II-III symptoms.  He does not appear significantly volume overloaded on exam and Optivol is below threshold with thoracic impedance trending up.  He has a Medtronic ICD.  - Continue Coreg, lisinopril, Lasix, spironolactone, hydralazine, and isordil at current doses.  - BMET/BNP today.   Problem # 4:  SMOKER (ICD-305.1) He has cut back but I counselled him to quit entirely.   Problem # 5:  HYPERLIPIDEMIA (B2193296.4) Patient has finally received Crestor from the [place] and start it today.  WIll get lipids/LFTs in 2 months.  Goal LDL < 70.   Other Orders: TLB-BMP (Basic Metabolic Panel-BMET) (99991111) TLB-BNP (B-Natriuretic Peptide) (83880-BNPR)  Patient Instructions: 1)  Your physician recommends that you have lab today--BMP/BNP 428.22 2)  Your physician recommends that you return for a FASTING lipid  profile/liver profile in 2 months--428.22 3)    4)  Your physician recommends that you schedule a follow-up appointment in: 3 months with Dr Aundra Dubin.

## 2010-10-02 NOTE — Letter (Signed)
Summary: Generic Letter  Press photographer, [place]  [address] [place]   [place], [place] [address]   Phone: [phone]  Fax: [phone]        September 23, 2010 MRN: [id number]    [address] [address], [place]  [id number]    Dear Mr. DEBOIS,   Your lab done 09/18/10 was OK. There were no changes. Please call our office if you have questions.         Sincerely,     Desiree Lucy, RN, BSN  This letter has been electronically signed by your physician.

## 2010-10-10 ENCOUNTER — Encounter: Payer: Medicare Other | Admitting: *Deleted

## 2010-10-14 LAB — POCT I-STAT, CHEM 8
BUN: 18 mg/dL (ref 6–23)
Chloride: 103 mEq/L (ref 96–112)
Creatinine, Ser: 1.3 mg/dL (ref 0.4–1.5)
Glucose, Bld: 359 mg/dL — ABNORMAL HIGH (ref 70–99)
Hemoglobin: 16.7 g/dL (ref 13.0–17.0)
Potassium: 4.1 mEq/L (ref 3.5–5.1)
Sodium: 136 mEq/L (ref 135–145)

## 2010-10-16 ENCOUNTER — Ambulatory Visit (INDEPENDENT_AMBULATORY_CARE_PROVIDER_SITE_OTHER): Payer: Medicare Other | Admitting: *Deleted

## 2010-10-16 DIAGNOSIS — I4892 Unspecified atrial flutter: Secondary | ICD-10-CM

## 2010-10-16 DIAGNOSIS — Z7901 Long term (current) use of anticoagulants: Secondary | ICD-10-CM

## 2010-11-12 ENCOUNTER — Other Ambulatory Visit: Payer: Medicare Other

## 2010-11-13 ENCOUNTER — Ambulatory Visit (INDEPENDENT_AMBULATORY_CARE_PROVIDER_SITE_OTHER): Payer: Medicare Other | Admitting: *Deleted

## 2010-11-13 ENCOUNTER — Other Ambulatory Visit (INDEPENDENT_AMBULATORY_CARE_PROVIDER_SITE_OTHER): Payer: Medicare Other | Admitting: *Deleted

## 2010-11-13 DIAGNOSIS — I4892 Unspecified atrial flutter: Secondary | ICD-10-CM

## 2010-11-13 DIAGNOSIS — I5022 Chronic systolic (congestive) heart failure: Secondary | ICD-10-CM

## 2010-11-13 DIAGNOSIS — I428 Other cardiomyopathies: Secondary | ICD-10-CM

## 2010-11-13 LAB — LIPID PANEL
Cholesterol: 123 mg/dL (ref 0–200)
HDL: 30.1 mg/dL — ABNORMAL LOW (ref >39.00)
LDL Cholesterol: 56 mg/dL (ref 0–99)
Triglycerides: 184 mg/dL — ABNORMAL HIGH (ref 0.0–149.0)

## 2010-11-13 LAB — POCT INR: INR: 1.8

## 2010-11-13 LAB — HEPATIC FUNCTION PANEL
AST: 25 U/L (ref 0–37)
Albumin: 3.9 g/dL (ref 3.5–5.2)
Alkaline Phosphatase: 56 U/L (ref 39–117)
Total Protein: 6.9 g/dL (ref 6.0–8.3)

## 2010-11-18 ENCOUNTER — Telehealth: Payer: Self-pay | Admitting: *Deleted

## 2010-11-18 NOTE — Assessment & Plan Note (Signed)
[facility]   Anthony Rubio, Anthony Rubio NO.:  [id number]   MEDICAL RECORD NO.:  [id number]      DATE OF BIRTH:  [birth year]   PHYSICIAN:  Viviann Spare. Nyoka Cowden, M.D.        VISIT DATE:                                   OFFICE VISIT   REFERRING PHYSICIAN:  [facility].   PROBLEM:  Foul-smelling ulcer of the left first metatarsal phalangeal  joint area.   HISTORY:  A 60 year old male returns to wound care and hyper[facility]ic center  for further evaluation of a recurrent ulcer on the sole of his left  foot.  The patient was previously a patient here from 2008 through 2009  for problems of ulceration in the same are.  His current ulcer started  about 3-4 weeks ago and has been draining a red malodorous material.  There has only been a mild amount of discomfort.   When the patient was seen here in 2008 through 2009, the ulceration was  in the similar area.  He ultimately responded to total contact casting  with Silverlon regressed after being putted on offloading sandal and  although the wound was deemed resolved, but on March 09, 2007, it did  recur by October 2008.  He again was treated with offloading sandal,  antibiotics.  He improved and relapsed.  He was put on total contact  cast again on December 2008.  He had measurements made for Cam walker  and use some of this, it was felt that he deteriorated further in that,  but August 23, 2007, he was again having problems with deterioration  of the ulcer.  Following that visit, there appear to be gradual  improvement in his ulcer over the next several months.  He ultimately  was discharged from [facility] on December 14, 2007.   He subsequently has become careless of caring for his feet.  He  attempted to get back into the wound care area several weeks ago, but  was told that he had to go see another physician for referral since he  had been discharged.   The patient has not been on any antibiotics.  He  has been trying not to walk so much on his foot.   PAST MEDICAL HISTORY:  1. Non-insulin-dependent diabetes mellitus.  2. Hypertension.  3. Leg weakness, unstable gait.  4. Peripheral arterial obstructive disease.   ALLERGIES:  Possibly to ERYTHROMYCIN, although he says he has taken this  within the last year and did not have reaction.   CURRENT MEDICATIONS:  Hydrochlorothiazide and metformin currently being  refilled through the urgent care center.   SOCIAL HISTORY:  He is married.  He is disabled.  Disability has been  because of the ulcers on his feet and unstable gait.  He is having  difficulty finding a physician, although he claims that he has Medicare  now because of the disability.   FAMILY HISTORY:  Noncontributory to current condition.   REVIEW OF SYSTEMS:  No recent weight change, diabetes under only fair  control.  The patient is alert, cooperative, capable of understanding.  Head, eyes, ears, nose, and throat are unremarkable.  There  have been no  recent visual changes.  He denies any chest pain, palpitation, shortness  of breath.  He has no prior history of ulcer or recurrent indigestion,  heartburn, or nausea.  He has been no diarrhea.  He has no genitourinary  complaints, does have weak legs, which appeared an unstable gait.  Dr.  London Pepper was one of the physicians managing his previous wounds and he was  concerned about a diabetic myopathy causing his leg weakness.  He has an  unstable gait.  He also has diminished sensation over his feet.  Circulation reportedly has been diminished in the feet.  There was a  recommendation that he see a vascular surgeon in the past, but I cannot  find evidence that was ever carried out.   PHYSICAL EXAMINATION:  VITAL SIGNS:  Temperature 98.3, pulse 84,  respirations 20, blood pressure 114/78.  Capillary glucose 133 mg  percent.  GENERAL:  The patient is alert, cooperative,  cheerful, mesomorphic, and  body habitus.  HEAD, EYES, EARS, NOSE, AND THROAT:  Unremarkable.  NECK:  Supple without thyromegaly, nodule, or mass.  CHEST:  Clear.  CARDIAC:  Heart sounds were regular with a regular sinus rhythm and  without murmur.  ABDOMEN:  Nontender without organomegaly.  EXTREMITIES:  Some weakness when attempting to walk.  He also has  unstable gait.  There are no other deformities.  SKIN:  No ulcer of the left foot on the sole at 2.5 x 2.8 x 0.2 cm  surrounded by great deal of callus and with a malodorous ooze from an  ulceration center of it.  NEUROLOGIC:  He has some mild diminishment and sensation of the legs and  feet.   TREATMENT:  It was extensively debrided sharply with scalpel and  forceps.  Following debridement, he has Silvadene cream and gauze  applied to the wound and we prescribed a healing sandal with felt  offloading.   ICD-9 code is 707.15 ulcer of the foot.  250.80 diabetes mellitus with other manifestations.  401.9 hypertension.  443.9, peripheral vascular disease.  This is a Wagner grade 1 wound.   CPT code [phone].      Viviann Spare Nyoka Cowden, M.D.  Electronically Signed     AGG/MEDQ  D:  06/15/2008  T:  06/16/2008  Job:  [id number]

## 2010-11-18 NOTE — Assessment & Plan Note (Signed)
[facility]   Anthony Rubio, Anthony Rubio NO.:  [id number]   MEDICAL RECORD NO.:  [id number]      DATE OF BIRTH:  1951/06/03   PHYSICIAN:  Orlando Penner. Sevier, M.D.  VISIT DATE:  09/21/2007                                   OFFICE VISIT   HISTORY:  This 60 year old black male has been followed for a plantar  diabetic foot ulcer on the left first metatarsal head which had resolved  and since he was last seen here 2 weeks ago, he has been in a diabetic  shoes with custom insert, with that insert having been further revised  at that time.  He reports no trouble with that foot.   He has had as well on the right lateral malleolar area, a small ulcer  which may begun traumatically but he is uncertain, which has been  treated with applications of Iodosorb.  He reports that it seems  unchanged but does not really particular bother him, other than his  concern that it has not healed.  There is been no pain, drainage, odor,  swelling, etc.   EXAMINATION:  VITAL SIGNS:  Blood pressure 170/96, pulse 79,  respirations 18, temperature 98.3, self-obtained blood glucoses 137  mg/dL.  The area on the plantar aspect of the left foot at the site of previous  ulceration has a minimal degree of callus formation and is totally  intact.  The wound overlying the right lateral malleolar area is  encrusted at its margins but seems to have a partially epithelialized  base.   IMPRESSION:  1. Satisfactory resolution, diabetic foot ulcer left foot.  2. Pressure ulcer, right lateral malleolar area, making satisfactory      progress.   DISPOSITION:  1. The patient's footwear is inspected and appears adequate.  2. The wound on the right lateral malleolar area is selectively      debrided of the crusty margins, and some adjacent calluses has      developed.  This leaves a fairly flat area of ulceration that      measures only about 0.3 x 0.3 cm and does have certainly  partial      epithelialization in its base.   PLAN:  The plan will be to cleanse that area on a daily basis, to apply  a small amount of triple antibiotic ointment and to protect the area  with a circumferential felt donut.   The patient will return for follow-up visit in 2 weeks.           ______________________________  Orlando Penner London Pepper, M.D.     RES/MEDQ  D:  09/21/2007  T:  09/21/2007  Job:  [id number]

## 2010-11-18 NOTE — Assessment & Plan Note (Signed)
[facility]   Anthony Rubio, Anthony Rubio NO.:  [id number]   MEDICAL RECORD NO.:  [id number]      DATE OF BIRTH:  [birth year]   PHYSICIAN:  Epifania Gore. Nils [name], M.D. VISIT DATE:  08/09/2007                                   OFFICE VISIT   SUBJECTIVE:  Mr. Lorenzini is a 60 year old man who we are following for  Wagner 2 diabetic foot ulcer involving the volar aspect of the with left  met head.  In the interim we have treated him with antiseptic soap wash  and topical Iodosorb.  He continues to wear healing sandal while  awaiting an appointment to have his custom orthotics adjusted.  In the  interim he has sustained an injury to the right ankle with an  ulceration.  There has been no fever or malodorous drainage.   OBJECTIVE:  Blood pressure is 147/84, respirations 18, pulse rate 76,  temperature is 97.6.  Capillary blood glucose is 133 mg percent.  Inspection of the lower extremity shows that there is trace edema  bilaterally with no evidence of acute infection, lymphangitis or  cellulitis.  Wound number 1A on the left plantar surface of the foot at  the metatarsal phalangeal joint has decreased minimally.  There is  impacted Iodosorb but there is no evidence of active infection or  drainage.  The pedal pulse remains readily palpable bilaterally.  On the  right lateral malleolus, there is a focal area of traumatic avulsion  with chronic appearing granulation tissue.  There is no drainage on this  wound.  There is no exposure of the underlying capsule.   ASSESSMENT:  Improvement of wound number 1A with a new wound #2 related  to trauma.   PLAN:  We have instructed the patient to use antiseptic soap to cleanse  both of these wounds.  He is to apply topical Iodosorb and utilize a  clean white cotton sock as the dressing.  He is to continue to wear the  Cam walker to offload the left foot.  He has been instructed to excise  with extreme caution and avoid  trauma to the right lateral malleolus.  He is to continue to pursue the procurement of his custom orthotic  adjustments.  We will reevaluate him in 2 weeks.  He is to inspect his  feet daily.  If he notices any drainage or increase in his wound or  swelling,  he is to the and concerned in any way, he is to call for an  interim appointment.  We have given the patient opportunity to ask  questions.  He seems to understand indicates that he will be compliant.      Harold A. Nils [name], M.D.  Electronically Signed     HAN/MEDQ  D:  08/09/2007  T:  08/09/2007  Job:  [id number]

## 2010-11-18 NOTE — Assessment & Plan Note (Signed)
[facility]   NAMEBENSEN, SOLDANO NO.:  [id number]   MEDICAL RECORD NO.:  [id number]      DATE OF BIRTH:  11-Feb-1951   PHYSICIAN:  Viviann Spare. Nyoka Cowden, M.D.   VISIT DATE:  07/20/2008                                   OFFICE VISIT   HISTORY:  A 60 year old diabetic returns to [place] for  inspection of wound at the left plantar surface at the first  metatarsophalangeal head.  The patient has done well since his last  visitation here.  Wound has progressed to a complete healing and there  is no discomfort.  Antibiotics seemed to work very well according to the  patient.   WOUND INSPECTION AND EXAMINATION:  Temperature 98.5, pulse 80,  respirations 20, blood pressure 128/81, capillary glucose 134.  Wound  shows only calloused material over the ball of the left foot on the  plantar surface.  There is no bleeding.  There is no open areas and no  ulceration.  A callus buildup is extensive and will likely continue to  be a problem in the future.  Wound appears completely healed.  We did  debride this area sharply of excessive callus buildup.  The patient  tolerated the procedure well.  There was no bleeding.   The patient is advised to use Eucerin cream to lubricate the skin and to  consider seeing a podiatrist periodically for removal of callus buildup  in this area to prevent ulceration underneath the callus.   DISCHARGE:  From [facility].   ICD-9 707.15 ulcer of the foot.   443.9 peripheral vascular disease.   250.80 diabetes mellitus with other specific manifestations.   CPT code [phone] and [phone] selective debridement less than 20 cm2.      Viviann Spare Nyoka Cowden, M.D.  Electronically Signed     AGG/MEDQ  D:  07/20/2008  T:  07/20/2008  Job:  [id number]

## 2010-11-18 NOTE — Assessment & Plan Note (Signed)
[facility]   NAMEJASDEEP, MCKOWN NO.:  [id number]   MEDICAL RECORD NO.:  [id number]      DATE OF BIRTH:  09/23/1950   PHYSICIAN:  Epifania Gore. Nils [name], M.D. VISIT DATE:  12/02/2007                                   OFFICE VISIT   SUBJECTIVE:  Mr. Arrona is a 60 year old man who we are following with a  Wagner 1 diabetic foot ulcer, involving the left volar metatarsal head.  We have treated him with offloading and started him on Septra and  doxycycline due to increased edema and erythema.  In the interim, he  continues to be ambulatory.  His sugars are well controlled.  He has  noted increased edema.   OBJECTIVE:  VITAL SIGNS:  Blood pressure is 160/97, respirations 16,  pulse rate 81, temperature is not recorded.  The blood glucose is 135 mg  percent.  EXTREMITIES:  Inspection of the left plantar surface shows that there is  a reaccumulation of halo of callus.  This was again pared.  There is a  punctate ulcer that extends into the subcutaneous tissue.  There is no  evidence of infection, erythema, or cellulitis.  There is of note 2+  edema bilaterally.   ASSESSMENT:  Wagner 1 diabetic foot ulcer, left foot with fluid  retention, questionable  etiology.   PLAN:  We will discontinue the patient's antibiotics as his culture  showed only normal flora.  There is no active evidence of infection on  his current exam.  We have instructed the patient to make an appointment  with his primary care physician which he has agreed to do.  We will  reevaluate him in 2 weeks.  He will continue the offloading sandal and  the application of Iodosorb every 2 days.  He will continue the  offloading sandal while pursuing the procurement of adjustment to his  custom inserts.      Harold A. Nils [name], M.D.  Electronically Signed     HAN/MEDQ  D:  12/02/2007  T:  12/02/2007  Job:  [id number]

## 2010-11-18 NOTE — Assessment & Plan Note (Signed)
[facility]   NAMEJCION, PROHASKA NO.:  [id number]   MEDICAL RECORD NO.:  [id number]      DATE OF BIRTH:  05-Dec-1950   PHYSICIAN:  Orlando Penner. Sevier, M.D.  VISIT DATE:  10/05/2007                                   OFFICE VISIT   HISTORY:  This is a 60 year old black male who has been followed for a  small pressure ulcer occurring in the face of diabetes on the lateral  malleolus of the right ankle.  Most recently, this has shown progressive  healing with the use of a small amount of Iodosorb ointment and  protection with a felt donut.   He arrives today saying that he feels that the area is completely healed  and that it is absolutely free of any symptoms whatsoever.   In addition, he has in the past had some trouble with the first  metatarsal head area on the plantar aspect of the left foot and this has  been treated with custom inserts which have been modified with a little  additional posting recently and that with good benefit.  He feels that  area is likewise non-problematic at the moment.   PHYSICAL EXAMINATION:  VITAL SIGNS:  Blood pressure 134/77, pulse 84,  respirations 18, temperature 98.7.  The wound on the right ankle is seen to be completely healed nor is  there any evidence of open wound on the first metatarsal head plantar  aspect of the left foot.  Incidentally, that area shows very little  callus buildup so this would be suggested that it is well-protected from  undue pressure or shearing in his current footwear.   The patient is advised to continue to keep both areas clean and dry and  it is recommended that he continue to wear donut around the right  lateral malleolus for a while until that area toughens.   The patient mentions that he has some sense of weakness in the proximal  muscles on his left lower extremity which he attributes to time  previously spent in total contact casting.  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  This is discussed with him.  He is advised to  be careful to walk as much as possible in an effort to gain strength and  that should the problem not resolve itself over a reasonable period of  time (2-3 months), that he might either re-consult [place] or his primary  physician regarding perhaps some physical therapy.   The patient will be today discharged from this clinic with return here  simply on a p.r.n. basis.           ______________________________  Orlando Penner London Pepper, M.D.     RES/MEDQ  D:  10/05/2007  T:  10/05/2007  Job:  [id number]

## 2010-11-18 NOTE — Assessment & Plan Note (Signed)
[facility]   NAMEEILJAH, Anthony Rubio NO.:  [id number]   MEDICAL RECORD NO.:  [id number]      DATE OF BIRTH:  1951/04/26   PHYSICIAN:  Epifania Gore. Nils [name], M.D. VISIT DATE:  01/18/2007                                   OFFICE VISIT   SUBJECTIVE:  Anthony Rubio is a 60 year old man with a Wagner grade 3 diabetic  foot ulcer involving his left foot.  We have treated him with a total  contact cast for the last 2 weeks.  He returns for followup.  He denies  interim pain, fever, drainage or malodor.   OBJECTIVE:  Blood pressure is 121/69, respirations 18, pulse rate 85,  temperature 97.8.  Capillary blood glucose is 137 mg%.  Inspection of  the left plantar foot shows that the wound is completely epithelialized.   ASSESSMENT:  Resolved wound.   PLAN:  We are making an immediate referral to the orthotics for the  patient to undergo custom molding.  Following this, we will place him in  a Cam walker with a fiberglass wrap until he receives his custom  orthotics.  We have explained this approach to the patient in terms he  seems to understand.  We have given an opportunity to ask questions.  Moreover, we have encouraged him to revisit with his family practitioner  regarding routine diabetic foot care and also to pursue a relationship  with a podiatrist.  We have emphasized the need for optimum management  of his diabetes and vigilant compliance with his diabetic instructions  forth going.  The patient seems to understand and indicates that he will  be compliant.      Harold A. Nils [name], M.D.  Electronically Signed     HAN/MEDQ  D:  01/18/2007  T:  01/18/2007  Job:  [id number]

## 2010-11-18 NOTE — Assessment & Plan Note (Signed)
[facility]   NAMEARNETT, HABIBI NO.:  [id number]   MEDICAL RECORD NO.:  [id number]      DATE OF BIRTH:  05-17-1951   PHYSICIAN:  Epifania Gore. Nils [name], M.D. VISIT DATE:  11/25/2007                                   OFFICE VISIT   SUBJECTIVE:  [name] is a 60 year old man who was last seen in the [place] on April 1 with a resolved Wagner 2 diabetic foot ulcer.  He  returns complaining of a noticeable drainage on the volar aspect of the  left foot.  The patient has been exceptionally active and has noted  concurrent swelling of his foot. The patient is not checking his blood  sugar and he does not have a primary care physician at present.  He  continues to wear custom orthotics.  There has been no fever and no  pain, but he has noted malodor.  He continues to be ambulatory.   OBJECTIVE:  Blood pressure is 118/71, respirations 18, pulse rate 89,  temperature is 98.4.  Inspection of the left plantar foot shows that  there is an area of blister which has ruptured at the head of the first  metatarsal.  There is a moderate amount of callus which was pared  disclosing a frank ulceration extending into the dermis.  The callus was  completely pared.  There was no bleeding.  The pedal pulses were 3+.  There was no evidence of ascending infection, cellulitis, or abscess.   ASSESSMENT:  Wagner 1 diabetic foot ulcer with inadequate offloading.   PLAN:  We will place the patient in an offloading healing sandal.  We  have instructed him in the use of topical Iodosorb and continuation of  routine diabetic foot care to include antibacterial soap washing and  thorough drying.  We have admonished the patient to follow his previous  diabetic instruction that he received through the [facility].  We have given the patient opportunity to ask questions.  He  seems to understand.  We will reevaluate him in 1 week p.r.n. swelling  or pain.  He has  also been given a prescription for orthotic adjustment.  A culture of the left hallux was taken and the patient was started on  doxycycline 100 mg p.o. b.i.d. along with Septra DS one p.o. b.i.d.  We  will see him in 1 week.      Harold A. Nils [name], M.D.  Electronically Signed     HAN/MEDQ  D:  11/25/2007  T:  11/25/2007  Job:  [id number]

## 2010-11-18 NOTE — Assessment & Plan Note (Signed)
[facility]   NAMELACHLAN, SPEIR NO.:  [id number]   MEDICAL RECORD NO.:  [id number]      DATE OF BIRTH:  [birth year]   PHYSICIAN:  Epifania Gore. Nils [name], M.D. VISIT DATE:  01/04/2007                                   OFFICE VISIT   SUBJECTIVE:  Anthony Rubio is a 60 year old with a Wagner grade 3 diabetic  foot ulcer.  We have been treating with total contact casting.  He was  last seen 2 weeks ago.  In the interim he denies excessive pain,  malodor, or fever.   OBJECTIVE:  Blood pressure is 139/93, respirations 16, pulse rate 86,  temperature 97.9.  Capillary blood glucose is 132 mg percent.  Inspection of the left volar metatarsal phalangeal joint shows that the  there has been a moderate amount of maceration with underlying healthy  granulation.  There is extensive callus formation around the previous  ulcer.  We have debrided the wound utilizing a 10 blade with hemorrhage  control with direct pressure and silver nitrate.  There is no evidence  of a ascending infection or ischemic compromise.   ASSESSMENT:  Clinical improvement.   PLAN:  We are returning the patient to a total contact cast.  We will  add a Silverlon swath and provide a SofSorb.  We will reevaluate the  patient in 2 weeks.      Harold A. Nils [name], M.D.  Electronically Signed     HAN/MEDQ  D:  01/04/2007  T:  01/04/2007  Job:  [id number]

## 2010-11-18 NOTE — Assessment & Plan Note (Signed)
[facility]   NAMEDAKOTA, TATES NO.:  [id number]   MEDICAL RECORD NO.:  [id number]      DATE OF BIRTH:  [birth year]   PHYSICIAN:  Epifania Gore. Nils [name], M.D. VISIT DATE:  05/26/2007                                   OFFICE VISIT   SUBJECTIVE:  [name] is a 60 year old man who we are following for  Wagner II diabetic foot ulcer.  In the interim, we have treated him with  Iodosorb ointment and a white cotton socks and orthotic adjustments.  He  has been wearing a new custom insert for approximately a week.  There  has been decrease in drainage, no pain, and no fever.  He continues to  be ambulatory.   OBJECTIVE:  Blood pressure is 189/109, respirations are 18, pulse rate  86, temperature is 98.3, capillary blood glucose is 133 mg percent.  Inspection of the left plantar surface of the foot shows that there is a  minuscule residual area of ulceration.  An area of undermining was  sharply excised.  Thereafter the complete wound was excisionally  debrided without difficulty.  Hemorrhage was controlled with a direct  pressure.  The pedal pulse is indeterminate, but the both feet are  symmetrically warm with moderate capillary refill and no evidence of  acute ischemia.   ASSESSMENT:  Clinical improvement with adjustment of offloading  orthotics.   PLAN:  The patient will continued the antiseptic soap wash, Iodosorb and  offloading utilizing the insert.  We will reevaluate him in 1 week.      Harold A. Nils [name], M.D.  Electronically Signed     HAN/MEDQ  D:  05/26/2007  T:  05/26/2007  Job:  [id number]

## 2010-11-18 NOTE — Assessment & Plan Note (Signed)
[facility]   NAMEPINO, DUECK NO.:  [id number]   MEDICAL RECORD NO.:  [id number]      DATE OF BIRTH:  05-01-1951   PHYSICIAN:  Epifania Gore. Nils [name], M.D. VISIT DATE:  11/23/2006                                   OFFICE VISIT   SUBJECTIVE:  Anthony Rubio is a 60 year old diabetic with a Wagner grade 3  ulcer on the volar aspect of his left foot.  He has been seen in the  clinic one week ago and was started on Septra DS 1 p.o. b.i.d. and  doxicycline 100 mg p.o. b.i.d.  In the interim, he denies excessive  drainage, malodor, or fever.  He continues to have swelling.  His  offloading is being accomplished with a CAM walker.  He has not had  follow up with the [facility] clinic.   OBJECTIVE:  Blood pressure is 129/88, respirations 26, pulse rate 78,  temperature is 97.6.  The patient has not checked his blood sugars.  Inspection of the lower extremities shows that there is bilateral 2+  edema with chronic changes of stasis.  The pedal pulses are  indeterminate due to significant pedal edema.  Capillary refill is  sluggish.  The ulcer on the left volar met-head penetrates to the joint.  There is no excessive drainage.  There is no malodor.  The patient is  insensate.   ASSESSMENT:  Wagner grade 3 diabetic foot ulcer with concurrent  ischemia.   PLAN:  We are referring the patient for vascular consultation.  We have  also encouraged him to return to the [facility] clinic for management of his  diabetes.   The transcutaneous oxygen measurements demonstrate severe ischemia with  a moderate response to supplemental oxygen.      Harold A. Nils [name], M.D.  Electronically Signed     HAN/MEDQ  D:  11/23/2006  T:  11/23/2006  Job:  [id number]   cc:   Barton Fanny, M.D.

## 2010-11-18 NOTE — Assessment & Plan Note (Signed)
[facility]   NAMEDAVANTA, JUERGENSEN NO.:  [id number]   MEDICAL RECORD NO.:  [id number]      DATE OF BIRTH:  [birth year]   PHYSICIAN:  Epifania Gore. Nils [name], M.D. VISIT DATE:  06/27/2007                                   OFFICE VISIT   SUBJECTIVE:  Mr. Whitehouse returns for follow-up of a Wagner grade 3 diabetic  foot ulcer involving his left hallux.  In the interim the patient has  lost 15 pounds, he is complaining of polyuria, polydipsia and anorexia.  His blood sugar has been essentially stable in the 130s on the morning  checks.  He is uncertain as to who his current physician is but  describes care being given through one of the [facility].  He has no  definite appointment for follow-up.   OBJECTIVE:  His blood pressure is 146/89, respirations 18, pulse rate is  81, temperature is 98.7, capillary blood glucose is 129 mg percent.  The  patient is alert, cooperative and appears in good contact with reality.  Inspection of the wound shows that there is a moderate amount of  subdermal hemorrhage with some chronic inflammation with drainage.  There is no particular malodor.  A halo of intense callus was pared,  this disclosed a large ulceration associated with a subdermal  hemorrhage.  A full-thickness excisional debridement was performed with  excision of callus, subcutaneous tissue, reactive tissue.  The wound did  not extend into the joint capsule.   Inspection of his footwear shows that there is excessive wear with  indentation corresponding to the ulceration.   ASSESSMENT:  Wagner 2 diabetic foot ulcer, recurrent, with inadequate  offloading.   PLAN:  We have recommended that the patient be returned to a total  contact cast.  We have cultured the wound, he is driving today.  We have  asked him to make arrangements to have someone drive him home from the  clinic.  He will need to wear an offloading sandal home, make the  arrangements and  return to the clinic this afternoon for the total  contact cast placement.   With regard to the management of his diabetes we have instructed the  patient to make the appointment with the appropriate [facility] as we  feel that his weight loss, polyuria, polydipsia are portends of  inadequately controlled diabetes.  The retrogression of his wound may be  directly related to poor diabetic management.  The patient seems to  understand these explanations and instructions.  He expresses gratitude  for having been seen in the clinic and indicates that he will be  compliant.  We will reevaluate the patient later this afternoon for his  total contact casting and we will see him in 2 weeks.      Harold A. Nils [name], M.D.  Electronically Signed     HAN/MEDQ  D:  06/27/2007  T:  06/27/2007  Job:  [id number]

## 2010-11-18 NOTE — Assessment & Plan Note (Signed)
[facility]   NAMELOYCE, NEMBHARD NO.:  [id number]   MEDICAL RECORD NO.:  [id number]      DATE OF BIRTH:  Nov 26, 1950   PHYSICIAN:  Epifania Gore. Nils [name], M.D. VISIT DATE:  04/07/2007                                   OFFICE VISIT   SUBJECTIVE:  Anthony Rubio is a 60 year old man who was last seen a month ago  for [facility] grade 2 diabetic foot ulcer.  The patient underwent  successful treatment of his wound utilizing a total contact cast.  He  made a transition into custom orthotics and was discharged.  Over the  last 4 days he has noted a serous drainage on his white sock.  He called  for appointment was seen today.  There is been no interim fever, pain,  or malodor.   OBJECTIVE:  Blood pressure is 129/91, respirations are 16, pulse rate is  84, temperature is 97.8.  Capillary blood glucose is 132 mg percent.  Inspection of the left plantar foot shows that there is a an area of  increased callus formation with a central area of ulceration and bloody  drainage.  There is minimum erythema.  Pedal pulse remains questionable  but the capillary refill is normal.  An excisional debridement was  performed with excision of moderate callus, subcutaneous tissue, and  reactive nonviable necrotic tissue from the immediate vicinity of the  ulceration.  Hemorrhage was controlled with silver nitrate.   ASSESSMENT:  Inadequate offloading with a Wagner 2 diabetic foot ulcer.   PLAN:  We are referring the patient immediately to the orthotist for  adjustment of his insert.  If the patient cannot get in to see the  orthotist today, we will replace him in a total contact cast offloading  to prevent further deterioration.      Harold A. Nils [name], M.D.  Electronically Signed     HAN/MEDQ  D:  04/07/2007  T:  04/07/2007  Job:  [id number]

## 2010-11-18 NOTE — Assessment & Plan Note (Signed)
[facility]   NAMEHAYS, GRANUCCI NO.:  [id number]   MEDICAL RECORD NO.:  [id number]      DATE OF BIRTH:  [birth year]   PHYSICIAN:  Viviann Spare. Nyoka Cowden, M.D.   VISIT DATE:  06/22/2008                                   OFFICE VISIT   HISTORY:  A 60 year old male returns for reinspection of the wound of  the left foot, plantar surface.  Wound has generally done better since  he was seen last week.  The drainage has subsided significantly.  Previous odor has also subsided.  The patient says the foot in general  is more comfortable than when he was seen on June 15, 2008.   EXAMINATION:  Temperature 97.8, pulse 85, respirations 16, blood  pressure 119/80.  Capillary glucose 128 mg%.  Wound #1, left plantar foot surface, now measuring 0.1 x 0.1 x 0.1 and  doing much better.  There is a tiny pinhole that is surrounded by  callus.  There is no significant erythema and no drainage was noted  today.   TREATMENT:  Debridement was done with scalpel and forceps to remove  surrounding callus.  The wound generally had an improved appearance.  No  anesthesia was used.  The patient tolerated the procedure well.  Less  than 20 sq cm tissue was debrided.  Minimal bleeding was encountered  which was controlled with silver nitrate.   Following debridement, Silvadene cream and gauze was applied to the  wound.  The patient was advised to do this again daily.  He is told that  he may bathe and wash.  Healing sandal that is being worn by the patient  and he is to continue to wear the offloading sandal daily.  He will  return for reinspection of the wound in 3 weeks, debridement of any  residual callus, and hopefully discharge.   ICD-9 #707.15, ulcer of the foot.  250.80, diabetes mellitus with other manifestations.  443.9, peripheral vascular disease.  Wagner I wound grading.   CPT, [id number] and [phone] (selective debridement of less than 20 sq cm).      Viviann Spare Nyoka Cowden, M.D.  Electronically Signed     AGG/MEDQ  D:  06/22/2008  T:  06/22/2008  Job:  [id number]

## 2010-11-18 NOTE — Assessment & Plan Note (Signed)
[facility]   NAMEDAMONTRE, HINERMAN NO.:  [id number]   MEDICAL RECORD NO.:  [id number]      DATE OF BIRTH:  October 07, 1950   PHYSICIAN:  Orlando Penner. Sevier, M.D.       VISIT DATE:                                   OFFICE VISIT   HISTORY:  This 60 year old black male is seen in followup for a diabetic  foot ulcer, plantar aspect of the left foot at the first metatarsal head  area.  He has had this he says for a number of months and it appears to  close over, then reopens immediately, and here we go.  He had been  treated with lesser measures by his own preference, but finally a week  ago agreed to go and get a total contact cast.  He also was found to  have staph infection in the wound and was treated with a week long  course of Keflex which was completed several days ago.  He had an x-ray  done which showed no osteomyelitis or no underlying bony abnormalities  that might be preventing healing.  No sesamoid bones, etc.  The contact  cast has been well tolerated.  He has had no increased pain, no  awareness of drainage, no odor, no swelling, no fever or systemic  symptoms since his last visit.   PHYSICAL EXAMINATION:  VITAL SIGNS:  Blood pressure is 138/86, pulse is  88, respirations 16, temperature 98.1.  Self-monitored blood glucose  earlier today was 132 mg/dL.   On the lateral aspect of his right ankle is a recently acquired  traumatic wound measuring 0.4 x 0.6 x 0.1 cm which he has been treated  with application of triple antibiotic cream with improvement.  The  primary lesion for which he had been managed here in the plantar aspect  of the left foot in the first metatarsal head area now measures 0.2 x  0.2 x 0.2 cm.  There is no evidence of significant drainage, no odor, no  spreading erythema and no suspicion of deep infection.   IMPRESSION:  Gradual progress of diabetic foot ulcer left plantar first  metatarsal head area, originally  Wagner grade 3.   DISPOSITION:  The matter is discussed with the patient at some length.  With this wound being as deep as it is broad, I suspect there may be  undermining or at least that the problem with getting the wound to stay  healed is because of the dead space left behind.  Accordingly, we  discussed and I have carried out a sharp saucerization full-thickness  debridement of the wound today resulting in the wound now measuring  approximately 0.6 x 0.4 x 0.2 cm.  In addition, I pared some of the  callus adjacent to the wound.  Tamponade  was achieved through the use  of pressure and wound was then dressed with an application of a  Silverlon pad and that extremity placed back in a total contact cast.   FOLLOW UP:  Followup visit will be here in 1 week.           ______________________________  Orlando Penner. London Pepper, M.D.     RES/MEDQ  D:  08/31/2007  T:  09/01/2007  Job:  [id number]

## 2010-11-18 NOTE — Assessment & Plan Note (Signed)
[facility]   NAMEVERDEN, DONINI NO.:  [id number]   MEDICAL RECORD NO.:  [id number]           DATE OF BIRTH:   PHYSICIAN:  Epifania Gore. Nils [name], M.D. VISIT DATE:  12/02/2006                                   OFFICE VISIT   SUBJECTIVE:  Anthony Rubio is a 60 year old diabetic with a Wagner grade 3  diabetic foot ulcer who we have been treating with an offloading, Darco,  modified boot.  He has also been concurrently treated for MRSA with  doxycycline and Septra.  In the interim, he has continued his diabetic  management through the [facility] and has recently received a glucose  monitor with instruction.  He returns for interim evaluation.  There has  been no excessive drainage, no malodor and no fever.   OBJECTIVE:  Inspection of the left plantar surface of the foot shows  that there is an extreme build up of callus with necrosis.  There is no  exposed bone at this point.  The callus was debrided  along with  nonviable tissue with hemorrhage control with Silver Nitrate Sticks.  The pedal pulse remains indistinct.  The capillary refill, however, is  normal.  The vascular assessment has been completed and has verbally  been reported as showing no critical stenosis.  One plus edema is  present.  The patient remains insensate.   ASSESSMENT:  Wagner grade 3 diabetic foot ulcer, inadequate offloading.   PLAN:  We are recommending that we place the patient in a total contact  cast with Aquacel Silver.  We will continue doxycycline 100 mg p.o.  b.i.d. and Septra Double-Strength 1 b.i.d. for an additional 20 days.  We will begin HBO as per orders as soon as the dive schedule can  accommodate the patient.  We will reevaluate his wound in 1 week.   We have explained the indications for hyperbaric oxygen treatment for  diabetic wound, in terms that the patient seems to understand.  We have  given him an opportunity to asks questions.  Have emphasized the  need  for him to continue close monitoring and management of his diabetes per  the [facility].  We have given him an opportunity to asks questions.  He seems to appreciate the seriousness of his diabetic foot infection  and  and the potential for major limb loss.  He wishes to proceed as  outlined.      Harold A. Nils [name], M.D.  Electronically Signed     HAN/MEDQ  D:  12/02/2006  T:  12/02/2006  Job:  [id number]   cc:   Barton Fanny, M.D.

## 2010-11-18 NOTE — Assessment & Plan Note (Signed)
[facility]   NAMEJONATHYN, Anthony Rubio NO.:  [id number]   MEDICAL RECORD NO.:  [id number]      DATE OF BIRTH:  [birth year]   PHYSICIAN:  Epifania Gore. Nils Rubio, M.D. VISIT DATE:  08/02/2007                                   OFFICE VISIT   SUBJECTIVE:  Anthony Rubio is a 60 year old man who we have followed for  Wagner II diabetic foot ulcer involving the left first met head.  In the  interim the patient has failed to keep his appointments due to malignant  diagnosis in his wife.  He has been attending to her and requiring most  of his time.  He has not had a excessive drainage, malodor, pain or  fever.  Over the last 2 weeks he has noted some increase serous stain on  his sock.  He returns for follow-up.  He has had no fever.  There has  been no pain.   OBJECTIVE:  Blood pressure is 167/87, respirations 16, pulse rate 77,  temperature is 98.1, capillary blood glucose is 134 mg percent.  Inspection of the left foot shows that there is a thick callus  surrounding an ulcer which extends down to the capsule at the metatarsal  phalangeal joint. The callus was sharply excised with hemorrhage  controlled with silver nitrate.  The wound itself was debrided of  subcutaneous tissue and frank necrosis.  The wound was irrigated.  There  was no evidence of active infection or cellulitis.  The pedal pulse  remains palpable.  The patient is insensate.  Inspection of his shoe  shows that the insert appears to be ineffective for offloading.   ASSESSMENT:  Earleen Newport II diabetic foot ulcer with inadequate offloading.   PLAN:  We are referring the patient to the orthotist for adjustment of  his insert to offload the left first met head. In the interim we will  place in a padded dressing.  He will begin wearing a Cam walker which,  he has been previously provided.  He will apply daily Iodosorb to the  wound following antiseptic soap wash.  We will reevaluate him in 1  week.      Anthony Rubio, M.D.  Electronically Signed     HAN/MEDQ  D:  08/02/2007  T:  08/03/2007  Job:  [id number]

## 2010-11-18 NOTE — Assessment & Plan Note (Signed)
[facility]   NAMEDEMARRE, Anthony Rubio NO.:  [id number]   MEDICAL RECORD NO.:  [id number]      DATE OF BIRTH:  06-17-1951   PHYSICIAN:  Ricard Dillon, M.D. VISIT DATE:  02/17/2007                                   OFFICE VISIT   HISTORY OF PRESENT ILLNESS:  Mr. Furno he returns in followup from his  appointment on 02/10/2007.  At that point in time he had noted pain and  swelling in his left foot.  There been some delay in  him returning to  clinic after appointment in mid July.  He had been in total contact  cast.  The last visit late July, he was a Cam walker with a fiberglass  wrap.  When seen last week, there was significant concern for infection.  He underwent a complete debridement of the area.  He was started on  doxycycline and Septra.  The wound was dressed with Neosporin and an  absorptive dressing.  He was placed in a Darco platform walker with  additional rubber padding to offload the first metatarsal area.  As  mentioned, he was given a prescription for doxycycline and Septra which  he says he only had enough for a week.  I do not see any additional  culture information.   The patient returns today stating the area feels fine.  He is not  complaining of any pain.  He said there is scant drainage present.   PHYSICAL EXAMINATION:  VITAL SIGNS:  On examination temperature 984,  pulse 86, respirations 18, blood pressure 136/90.  Blood glucose is 135.  EXTREMITIES:  The area over his left plantar foot actually only has a  small open area.  However, there is considerable surrounding callus.  I  did debride some of the callus however, I found no additional open area  here.  There was no pain and no tenderness.  The area is much less  swollen per his description.  There was no drainage present.   IMPRESSION:  Wegener's grade 3 diabetic foot wound.  This was infected  last week.  However, right now things appear to be improved.  I  would  like to continue the doxycycline and Septra for a total of 21 days of  treatment.  I did not deeply debride the area in question as this was  done last week.  I have continued him in the same Darco platform walker  with felt inserts.  He will continue to watch this area, applying  Neosporin and absorptive dressing.  Doxycycline and Septra continuing  for a further 2 weeks from this date.  He will be seen again in followup  next week.           ______________________________  Ricard Dillon, M.D.     MGR/MEDQ  D:  02/17/2007  T:  02/18/2007  Job:  [id number]

## 2010-11-18 NOTE — Assessment & Plan Note (Signed)
[facility]   NAMEGRADIN, KASPAR NO.:  [id number]   MEDICAL RECORD NO.:  [id number]      DATE OF BIRTH:  31-Aug-1950   PHYSICIAN:  Epifania Gore. Nils [name], M.D. VISIT DATE:  05/05/2007                                   OFFICE VISIT   SUBJECTIVE:  Anthony Rubio is a 60 year old man who was last seen on 04/14/07.  The patient has been treated with custom orthotics for offloading of a  volar ulcer on the left foot.  In the interim he has noted increased  drainage.  He has denied fever or malodor.   OBJECTIVE:  Blood pressure is 132/91, respirations 16, pulse rate 80,  temperature 98.8.  Capillary blood glucose is 136 mg percent.  Inspection of the left volar surface of the foot shows a thickened  callus with a penetrating ulcer in the central portion of callus. The  callus was pared. Thereafter an excisional debridement of undermining  nonviable tissue was performed. Areas of subcutaneous necrosis, reactive  tissue were removed. Hemorrhage was controlled with direct pressure and  an irrigation needle was used to cleanse the wound.  Thereafter, the  minimum bleeding was controlled with a silver nitrate stick.  The pedal  pulse remains readily palpable.  The patient is relatively insensate.   ASSESSMENT:  Wagner II diabetic foot ulcer.   PLAN:  We are referring the patient to orthotics for adjustment of his  inserts.  In the interim we will continue Iodosorb ointment, antiseptic  soap washes and a cotton sock daily.  He will have the orthotic  adjustment as soon as possible.  We will reevaluate him in 2 weeks.      Harold A. Nils [name], M.D.  Electronically Signed     HAN/MEDQ  D:  05/05/2007  T:  05/06/2007  Job:  [id number]

## 2010-11-18 NOTE — Assessment & Plan Note (Signed)
[facility]   NAMEDIVYANSH, LYMON NO.:  [id number]   MEDICAL RECORD NO.:  [id number]      DATE OF BIRTH:  [birth year]   PHYSICIAN:  Epifania Gore. Nils [name], M.D. VISIT DATE:  08/23/2007                                   OFFICE VISIT   SUBJECTIVE:  Mr. Longs is a 60 year old man whom we are treating for  recurrent ulceration involving his left hallux.  Over the weekend he has  complained of some hemorrhage.  There has been no pain.  There has been  no fever.  He continues to be ambulatory.  In the interim he had been  treating himself with the daily antiseptic soap wash and a topical  application of Iodosorb and a cotton sock.  He has been using a Cam  walker for offloading but he presents today wearing his custom orthotics  shoes.   OBJECTIVE:  Blood pressure is 150/90, respirations 20, pulse rate 80,  temperature is 98.4, capillary blood glucoses 136 mg percent.  Inspection of the left plantar surface shows that there is a heavy  callus ulcer with some subdermal hemorrhage extending down to the fascia  of the metatarsophalangeal joint capsule.  There is no particular  malodor.  The wound underwent an excisional debridement of callus,  subcutaneous tissue, and reactive inflammation utilizing a rongeur.  Wound #2 on the right lateral ankle shows dramatic decrease in size as a  result of the topical application of the Iodosorb.   ASSESSMENT:  1. Deterioration of wound 1A.  It is now a Hydrographic surveyor 3 diabetic foot      ulcer.  2. Improvement of wound #2 on the lateral ankle.   PLAN:  We have cultured the patient.  We have started him on Keflex 500  mg p.o. q.i.d.  Pending return of his culture, we will dress wound #1A  with Aquacel Silver and a total contact cast for complete offloading.  Wound #2, we will continue using Iodosorb.  We will reevaluate the  patient in 1 week p.r.n.      Harold A. Nils [name], M.D.  Electronically Signed     HAN/MEDQ   D:  08/23/2007  T:  08/23/2007  Job:  [id number]

## 2010-11-18 NOTE — Assessment & Plan Note (Signed)
[facility]   NAMELOWEL, BESTON NO.:  [id number]   MEDICAL RECORD NO.:  [id number]      DATE OF BIRTH:  15-Jul-1950   PHYSICIAN:  Orlando Penner. Sevier, M.D.  VISIT DATE:  12/14/2007                                   OFFICE VISIT   HISTORY:  This 60 year old black male has been followed here for some  time with a diabetic foot ulcer on the plantar aspect of the left foot.  This had healed through total contact casting and other measures and he  had been released.  He reappeared several weeks ago, however, when he  had noted a pinpoint recurrence.  Dr. Nils [name] saw him and there was some  significant degree of ulceration and he accordingly debrided the  surrounding callus and placed him on the use of Iodosorb on an every 2-3  day basis together with a healing sandal.   The patient appears today reporting that he thinks the foot has  completely healed.  At one point, he developed a small blister which he  has drained himself and the remnants of that are still present.  He has  had no pain, no fever, or no other systemic symptoms.  He has noticed no  drainage or odor (it is noted he did have Staphylococcus - MRSA -  previously).   He does mention that having been in total contact cast for considerable  amount of time he has developed some progressive weakness in his lower  extremities particularly in that left leg and has some difficulty with  gait.   PHYSICAL EXAMINATION:  Blood pressure is 160/100, pulse is 86,  respirations 16, and temperature 98.3.  On the plantar aspect of the  left foot at first metatarsal head area, there is some remnant of callus  and blister but no open ulceration.  There is no edema in that foot.   IMPRESSION:  Resolution diabetic foot ulcer, left first metatarsal head  area.   DISPOSITION:  1. The area is selectively debrided of the loose skin related with the      earlier blister formation and also some callous.   There is no open      wound revealed following such debridement.   The patient is advised to use his custom inserts which he has on hand at  home on a full-time basis and uses on both extremities for balance.   Considerable time was taken in discussing with him the need for  aggressive rehabilitation to get himself back on his feet and he  promises to undertake this.  I do entertain some concern that he may  well be developing diabetic amyotrophy in that left leg but if not in  both legs but if so hopefully this will be self limited.   He is given a form which will allow him to obtain a handicap tag for his  car in anticipation of an extended period of healing of whatever this  problem might be.   With respect to the wound, his return here will be on a p.r.n. basis.           ______________________________  Orlando Penner London Pepper, M.D.     RES/MEDQ  D:  12/14/2007  T:  12/14/2007  Job:  [id number]

## 2010-11-18 NOTE — Consult Note (Signed)
Anthony Rubio, BERENDSEN NO.:  [id number]   MEDICAL RECORD NO.:  [id number]          PATIENT TYPE:  REC   LOCATION:  FOOT                         FACILITY:  [place]   PHYSICIAN:  Orlando Penner. Sevier, M.D. DATE OF BIRTH:  1950/07/25   DATE OF CONSULTATION:  02/10/2007  DATE OF DISCHARGE:                                 CONSULTATION   HISTORY:  This 60 year old black male has been followed for a plantar  ulceration underlying the first metatarsal head on the right on a  diabetic basis.  This was initially clearly a Wagner stage III ulcer and  has improved.  Most recently, he has been in a [employer] with a  fiberglass wrap and had seen improvement.  He was here 3 weeks ago and  was asked to return in 2 weeks.  However, because he had been unable to  obtain the recommended shoes, he postponed this until getting his August  check and then picked up the shoes, deferring his visit until today.   Unfortunately 4 days ago, he began to notice pain, swelling and odor in  that foot.  He managed to get the fiberglass off and discovered that  indeed there was a recurrent ulceration and drainage of some reddish  malodorous materials.  He had some doxycycline and Septra DS at home  from previous treatment and began himself on those medications.  He  reports a dramatic improvement since that time.   On exam today, blood pressure 135/84, pulse 84, respirations 18,  temperature 98.4.   There is an area of hemorrhagic callus formation with some tiny open  area and slight malodorous drainage underlying the first metatarsal head  on the right.  This is full-thickness debrided.  There is no spreading  erythema, no lymphangitis.  No significant edema at this time.   IMPRESSION:  Reinfection of diabetic foot ulcer.   DISPOSITION:  The area is widely debrided and indeed now shows an open  ulceration of approximately 0.6 x 0.4 x 0.15-cm with a spongy base.  It  does not appear to penetrate  to bone.   The wound is dressed with an application of Neosporin and absorptive  dressing.   He is placed into a Darco platform walking shoe with additional foam  rubber padding to offload this first metatarsal head area.  The proper  gait with this shoe is discussed with the patient in some detail.   He is given a prescription for new doxycycline at 100 mg per day and  Septra DS at 1 capsule twice daily until he is seen again here in 1  week.  He is instructed to bring his shoes back at that visit, and  hopefully, he will be ready to go into them.   If this wound does not heal, a search at that time for evidence for  recurrent osteomyelitis or deeper underlying infection might be  necessary.          ______________________________  Orlando Penner London Pepper, M.D.    RES/MEDQ  D:  02/10/2007  T:  02/10/2007  Job:  [id number] [phone]

## 2010-11-18 NOTE — Assessment & Plan Note (Signed)
[facility]   NAMEKALIB, STARKOVICH NO.:  [id number]   MEDICAL RECORD NO.:  [id number]      DATE OF BIRTH:  04/11/1951   PHYSICIAN:  Ricard Dillon, M.D. VISIT DATE:  12/10/2006                                   OFFICE VISIT   PURPOSE OF TODAY'S VISIT:  Review of Wegener's grade III diabetic foot  ulcer currently being treated with Aquacel AG, a total contact cast.  He  is also taking Doxycycline and Septra for co-existent infection.  In the  interim he has done well.  He reports no undue pain or fever although he  is quite insensate.   WOUND EXAM:  His temperature is 98.3, pulse 88, respirations 16, blood  pressure 138/97, his blood glucose this morning was 127 (self reported).  Current wound dimensions are 0.8 x 0.7 x 0.1.  The depth of this has  improved markedly.  There is a good granulating base.  I did a partial  thickness debridement of surrounding wound callus; however, once again  the base of this wound actually looks good and I think we are  progressing toward healing with current regimen.   WOUND CARE PLAN AND FOLLOWUP:  We continued Aquacel AG and total contact  cast.  We will see him again in a week's time.           ______________________________  Ricard Dillon, M.D.     MGR/MEDQ  D:  12/10/2006  T:  12/10/2006  Job:  [id number]

## 2010-11-18 NOTE — Telephone Encounter (Signed)
Notes Recorded by Katrine Coho, RN on 11/18/2010 at 11:41 AM Pt given results by telephone. He had run out of fish oil. He will start taking Fish Oil 2000mg  daily. ------  Notes Recorded by [name], MD on 11/16/2010 at 9:46 PM Excellent LDL. High triglycerides. If he is not taking fish oil 2000 mg daily, would start.

## 2010-11-18 NOTE — Assessment & Plan Note (Signed)
[facility]   NAMEDSHAUN, DEETS NO.:  [id number]   MEDICAL RECORD NO.:  [id number]           DATE OF BIRTH:   PHYSICIAN:  Epifania Gore. Nils [name], M.D. VISIT DATE:  12/21/2006                                   OFFICE VISIT   SUBJECTIVE:  Anthony Rubio returns for followup of a Wagner grade 3 diabetic  foot ulcer.  In the interim, he has been evaluated by vascular surgery  service and has been determined to have small vessel vasculopathy and no  need for vascular surgery intervention as a component to his therapy at  this point.  We have elected to continue him with offloading using  doxycycline and Septra for treatment of a methicillin-resistant staph.  In the interim, there has been no excessive drainage, no malodor and no  fever.  He continues under the care of the clinic for his diabetes.  His  blood sugars have been running less than 160 today.  Capillary blood  glucose was 147 mg%.   OBJECTIVE:  Blood pressure is 107/82, respirations 16, pulse rate 80,  temperature 97.7.  Inspection of the left plantar foot shows that the  wound continues to decrease in size.  There is no need for debridement.  There is 1+ edema.  There is no evidence of critical ischemia.  The  sensation remains impaired.   ASSESSMENT:  Clinical improvement with response to offloading and  antibiotics.   PLAN:  We will continue the total contact cast and complete the current  course of antibiotics.  We will reevaluate the patient in 2 weeks.      Harold A. Nils [name], M.D.  Electronically Signed     HAN/MEDQ  D:  12/21/2006  T:  12/21/2006  Job:  [id number]

## 2010-11-18 NOTE — Assessment & Plan Note (Signed)
[facility]   NAMEHANCEL, GEPHART NO.:  [id number]   MEDICAL RECORD NO.:  [id number]      DATE OF BIRTH:  19-Oct-1950   PHYSICIAN:  Orlando Penner. Sevier, M.D.  VISIT DATE:  09/07/2007                                   OFFICE VISIT   HISTORY:  This 60 year old black male has been followed for a diabetic  foot ulcer underlying the first metatarsal head on the left foot and  also for a pressure ulcer on the right lateral malleolus.  At a visit a  week ago the foot ulcer had made limited progress.  It seemed have some  undermining and so was significantly saucerization.  He was returned to  a total contact cast.  The lesion on the right lateral malleolus has  been managed primarily with protection from repeat trauma and the daily  use of cleansing with the application of triple antibiotic cream.  The  patient reports that both areas seem, to him, better.  He has had no  problems with the total contact cast.  He has had no fever or systemic  symptoms to suggest any advance of infection in either area.   PHYSICAL EXAMINATION:  VITAL SIGNS:  On examination blood pressure is  112/79, pulse 90, respirations 18 and temperature 98.4.  EXTREMITIES:  The wound on the plantar aspect of the left foot at the  first metatarsal head area is indeed completely healed.  The wound on  the right lateral malleolar area measures 0.3 x 0.5 x 0.1 cm and has a  pretty clean base.   IMPRESSION:  1. Healing diabetic foot ulcer, left plantar surface.  2. Pressure wound, right lateral malleolar area improving.   DISPOSITION:  1. The the patient has brought with him his diabetic shoe with custom      insert for the left foot; and, this has had some modification so as      to direct his weightbearing away from that first metatarsal head.      I am willing to try him in this, but, in addition, will place a      small self adherent Allevyn pad over the ulcer to allow it  some      time for toughening.  He does not work and promises he will      minimize his time on his feet until this is indeed toughens up      somewhat.  2. The right lateral malleolar wound, he will continue to cleanse      daily, to dry adequately and to apply triple antibiotic cream.  He      will protect it with a soft dry dressing.   FOLLOW UP:  Follow up visit will be here in 2 weeks with the patient  cautioned to return sooner should he suspect that things are in any way  worsening particular with regards to the previous of plantar ulceration  site.           ______________________________  Orlando Penner. London Pepper, M.D.     RES/MEDQ  D:  09/07/2007  T:  09/08/2007  Job:  [id number]

## 2010-11-18 NOTE — Consult Note (Signed)
NAMEQUINCEY, Anthony Rubio NO.:  [id number]   MEDICAL RECORD NO.:  [id number]          PATIENT TYPE:  REC   LOCATION:  FOOT                         FACILITY:  [facility]   PHYSICIAN:  Epifania Gore. Nils [name], M.D.DATE OF BIRTH:  1951/03/09   DATE OF CONSULTATION:  11/16/2006  DATE OF DISCHARGE:                                 CONSULTATION   SUBJECTIVE:  Mr. Elenes is a 60 year old man who is referred by Dr.  Leward Quan for evaluation of an ulceration of the left foot.   IMPRESSION:  Wagner grade 3 diabetic foot ulcer.   RECOMMENDATIONS:  Proceed with offloading utilizing a modified healing  sandal with offloading specifically directed at the first metatarsal  head, placed the patient in an absorbent silver dressing, proceed with  laboratory to screen the patient for potential HBO therapy.   SUBJECTIVE:  The patient is a 60 year old diabetic who has had a wound  on the volar aspect of his left foot for the past 2 years. He was  initially being treated by Dr. Bjorn Loser initially but left Dr. Jacqlyn Larsen care  when he registered with the [facility].  He was seen by Dr.  Joseph Art and Dr. Leward Quan.  He has continued to care for the wound  himself by imitating the debridement techniques and procedures that Dr.  Bjorn Loser had in the previous year performed.  Over the last year the wound  has not been evaluated.  He has denied malodor.  He has denied excessive  drainage, pain or fever.   Most recently he has experienced progressive shortness of breath over  the past 2 weeks.  He has had no chest pain, no palpitations and no  syncope.  He denies previous surgeries.  He has had an upper GI  endoscopy and was treated for an infection as a result of the cultures  taken with endoscopy.   ALLERGIES:  He is allergic to ERYTHROMYCIN which causes his fingers to  tingle.   CURRENT MEDICATIONS:  1. Metformin 500 mg a day.  2. Lisinopril/hydrochlorothiazide 20/12.5 per day.   FAMILY  HISTORY:  Is positive for diabetes, hypertension, negative for  stroke.  Negative for cancer.   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  He is married to his  second wife.  He has one child who lives locally and five other children  who live remotely.   REVIEW OF SYSTEMS:  He is noticing progressive swelling in his lower  extremities and difficulty maintaining balance.  He denies bowel or  bladder dysfunction.  He admits to having lost some weight.  He has  episodic polydipsia, polyphagia. There are no visual changes.  There are  no stigmata of TIAs.   PHYSICAL EXAM:  GENERAL:  He is an alert and oriented man in no acute  distress.  He is oriented, communicable and he responds appropriately to  inquiry.  VITAL SIGNS:  Blood pressure is 129/86, respirations of 20, pulse rate  72, temperature is 98, capillary blood glucoses 168 mg percent.  HEENT:  Exam is clear.  NECK:  Supple.  Trachea is midline.  Thyroid is nonpalpable.  LUNGS:  Clear.  HEART:  The heart sounds are normal.  ABDOMEN:  Abdomen is soft.  EXTREMITIES:  Extremity exam is remarkable for bilateral 2-3+ edema.  There is no evidence of ascending infection and no malodor.  The pedal  pulses are not palpable mainly because of the presence of edema.  The  ankle-brachial indices are 0.93 on the right and 1.04 on the left. On  the left plantar surface of the foot there is a punched out ulcer that  extends down to the head of the metatarsal.  There is a serous drainage.  There is no malodor.  This wound was cultured. The patient is insensate  to the [facility] filament.  There is no evidence of active infection.   DISCUSSION:  The patient is a 60 year old diabetic who has had a wound  for 2 years and which he has treated himself intermittently.  He  presents in referral from the outpatient clinic.   We have recommended that we proceed with offloading utilizing a healing  sandal and a transverse felt strip to offload  the head of the first met.  We have not started him on antibiotics pending results of the cultures.  We will proceed with pre HBO screening, CBC, basic metabolic profile,  hemoglobin A1C, cardiogram, and chest x-ray.  We will collect this  database reevaluate the patient in 1 week. This patient has been under  the care for his diabetes via the [facility]. We have asked him to make  an appointment for reevaluation of his new symptoms of shortness of  breath.  He seems to understand the severity of the ulcer and the  potential for major limb loss.  We have given him an opportunity to ask  questions.  He expresses gratitude for having been seen in the clinic  and indicates that he will be compliant with treatment plan per above.      Harold A. Nils [name], M.D.  Electronically Signed     HAN/MEDQ  D:  11/16/2006  T:  11/16/2006  Job:  [id number]   cc:   Barton Fanny, M.D.

## 2010-11-18 NOTE — Assessment & Plan Note (Signed)
[facility]   [name], [name] NO.:  [id number]   MEDICAL RECORD NO.:  [id number]           DATE OF BIRTH:   PHYSICIAN:  Epifania Gore. Nils [name], M.D. VISIT DATE:  02/24/2007                                   OFFICE VISIT   SUBJECTIVE:  [name] is a 60 year old man who we have been treating with  a Wagner grade 3 diabetic foot ulcer involving the plantar surface of  the left hallux.  The patient had made a transition from a total contact  cast to an offloading healing [place].  He reports that there has been  continued drainage on his white sock.  There has been no fever and no  malodor.  He continues to monitor his blood glucoses, reporting that his  blood sugars are indiscriminately taken throughout the day, but mostly  around noon and they have been running 135 mg%.  He is unaccompanied.   OBJECTIVE:  His blood pressure is 152/94, respirations 16, pulse rate  74, temperature is 98.6.  Inspection of the left lower extremity shows  that there is no evidence of acute ischemia.  The ulcer, itself, has a  significant amount of subdermal hemorrhage and serous drainage with no  particular malodor.  The patient remains insensate and excisional  debridement was performed with resection of cornified epithelium  subcutaneous tissue and nonviable inflamed granulation tissue.  A Q-tip  was used to sound the resulting wound and it does appear to be just  opposed to the periosteum of the distal pharynx.   ASSESSMENT:  A Wagner grade 3 diabetic foot ulcer.   PLAN:  We are returning the patient to a total contact cast with a  Silverlon.  The patient had responded significantly in the past with  total contact cast and we will attempt to see if we an establish clear  response and improvement of his wound.  If not, we will recommend that  we proceed with hyperbaric oxygen treatment.  A culture was taken.  In  the interim, we will leave him with a Silverlon  swath.  We have also  referred him to the diabetic teaching clinic for enlighten and patient  management of diabetes.  The patient received this recommendation  favorably and with gratitude.  We will reevaluate the patient in 2  weeks.      Harold A. Nils [name], M.D.  Electronically Signed     HAN/MEDQ  D:  02/24/2007  T:  02/24/2007  Job:  [id number]

## 2010-11-18 NOTE — Assessment & Plan Note (Signed)
[facility]   NAMEJOBANNY, CORBELLO NO.:  [id number]   MEDICAL RECORD NO.:  [id number]      DATE OF BIRTH:  March 17, 1951   PHYSICIAN:  Epifania Gore. Nils [name], M.D. VISIT DATE:  04/14/2007                                   OFFICE VISIT   SUBJECTIVE:  Mr. Zodrow returns for follow-up of a Wagner grade II diabetic  foot ulcer.  In the interim, he has been seen by the orthotist with  adjustment of his insert.  He continues to use the antiseptic soap with  the topical tri-antibiotic.  He reports that his shoe has been modified  and he is having no difficulty in walking.  There has been no drainage.   OBJECTIVE:  Blood pressure is 174/80, respirations 18, pulse rate 72,  temperature 98.3.  Capillary blood glucose is 133 mg percent.  Inspection of the left plantar foot shows that there is miniscule  evidence of the previous ulceration.  Inspection of the shoe shows that  there is now a rocker bottom with a modification of the insert to  offload the plantar ulcer.  The pedal pulse remains readily palpable.   ASSESSMENT:  Clinical improvement to the adequate offloading of a Wagner  grade II diabetic foot ulcer.   PLAN:  Will encouraged the patient to continue the antiseptic soap  washing, wearing of the cotton sock for the dressing.  We will  reevaluate him in 2 weeks to assess his continued response to therapy.      Harold A. Nils [name], M.D.  Electronically Signed     HAN/MEDQ  D:  04/14/2007  T:  04/14/2007  Job:  [id number]

## 2010-11-18 NOTE — Assessment & Plan Note (Signed)
[facility]   NAMEKEARY, MCKISSACK NO.:  [id number]   MEDICAL RECORD NO.:  [id number]      DATE OF BIRTH:  [birth year]   PHYSICIAN:  Epifania Gore. Nils [name], M.D. VISIT DATE:  03/09/2007                                   OFFICE VISIT   SUBJECTIVE:  [name] is a 60 year old diabetic whom we have followed for  Wagoner grade 3 diabetic foot ulcer.  We have treated him with a total  contact cast and silver-lined swath.  He was last seen February 24, 2007.  We have directed him to the diabetic teaching clinic as well as to the  orthotics for custom shoe insert.  He returns having completed the  consultation with the orthotics service and has procured custom shoes  and inserts.  There has been no interim excessive drainage, malodor,  pain, or fever.   OBJECTIVE:  Capillary blood glucose is 131 mg percent.   The total contact cast was removed disclosing complete resolution of all  areas of previous ulcer.  The pedal pulse is palpable.  There is no  evidence of infection or tenderness whatsoever.   ASSESSMENT:  Resolved Wagoner grade 3 diabetic foot ulcer.   PLAN:  We have instructed the patient to continue the use of his custom  orthotics and the keep his followup with orthotics every 3-4 months for  readjustment of his inserts.  He has also been strongly urged to  continue outpatient management of his diabetes via the clinic.  He is to  inspect his feet daily.  He is to continue the routine diabetic foot  care as he has already been instructed.  We have given the patient  opportunity to ask questions.  He expresses gratitude for having been  seen in the clinic and indicates that he will be compliant as outlined.      Harold A. Nils [name], M.D.  Electronically Signed     HAN/MEDQ  D:  03/09/2007  T:  03/09/2007  Job:  [id number]   cc:   [facility]

## 2010-11-18 NOTE — Assessment & Plan Note (Signed)
[facility]   NAMEJIRAIYA, Anthony Rubio NO.:  [id number]   MEDICAL RECORD NO.:  [id number]           DATE OF BIRTH:   PHYSICIAN:  Epifania Gore. Nils [name], M.D. VISIT DATE:  06/06/2007                                   OFFICE VISIT   SUBJECTIVE:  Anthony Rubio is a 60 year old man with a Wegener grade 2  diabetic foot ulcer involving the head of the first metatarsal of the  left foot.  In the interim he has been treated with an offloading  healing sandal and applying topical Iodosorb.  There has been no  excessive drainage, malodor, pain or fever.  He continues to be  ambulatory.   OBJECTIVE:  Blood pressure is 119/85, respirations 18, pulse rate 84,  temperature is 98.2, capillary blood glucose is 129 mg percent.  Inspection of the left volar surface of the hallux of the left foot  shows that there is a punctate ulceration surrounded by a halo of  intermediate callus.  No debridement is needed.  There is no evidence of  ascending infection.  The foot is warm but not feverish and capillary  refill is moderately brisk.  There is trace edema.   ASSESSMENT:  Clinical improvement.   PLAN:  We are referring the patient back to the orthotist to have a  modification performed on his inserts to permit adequate offloading.  We  will reevaluate the patient in 2 weeks.  In the interim he is to  continue routine diabetic foot care and to wear the modified healing  sandal.  Have given the patient opportunity to ask questions.  He seems  to understand instructions and indicates that he will be compliant.      Harold A. Nils [name], M.D.  Electronically Signed     HAN/MEDQ  D:  06/06/2007  T:  06/06/2007  Job:  [id number]

## 2010-12-05 ENCOUNTER — Encounter: Payer: Self-pay | Admitting: Cardiology

## 2010-12-07 ENCOUNTER — Other Ambulatory Visit: Payer: Self-pay | Admitting: Cardiology

## 2010-12-08 ENCOUNTER — Other Ambulatory Visit: Payer: Self-pay | Admitting: *Deleted

## 2010-12-08 DIAGNOSIS — I5022 Chronic systolic (congestive) heart failure: Secondary | ICD-10-CM

## 2010-12-08 DIAGNOSIS — I428 Other cardiomyopathies: Secondary | ICD-10-CM

## 2010-12-08 DIAGNOSIS — I4892 Unspecified atrial flutter: Secondary | ICD-10-CM

## 2010-12-08 MED ORDER — LISINOPRIL 5 MG PO TABS: 5.0000 mg | ORAL_TABLET | Freq: Two times a day (BID) | ORAL | Status: DC

## 2010-12-08 MED ORDER — SPIRONOLACTONE 25 MG PO TABS: 25.0000 mg | ORAL_TABLET | Freq: Every day | ORAL | Status: DC

## 2010-12-11 ENCOUNTER — Ambulatory Visit (INDEPENDENT_AMBULATORY_CARE_PROVIDER_SITE_OTHER): Payer: Medicare Other | Admitting: *Deleted

## 2010-12-11 DIAGNOSIS — I4892 Unspecified atrial flutter: Secondary | ICD-10-CM

## 2010-12-11 LAB — POCT INR: INR: 2.5

## 2010-12-19 ENCOUNTER — Ambulatory Visit: Payer: Medicare Other | Admitting: Cardiology

## 2010-12-22 ENCOUNTER — Encounter: Payer: Self-pay | Admitting: Cardiology

## 2010-12-22 ENCOUNTER — Ambulatory Visit (INDEPENDENT_AMBULATORY_CARE_PROVIDER_SITE_OTHER): Payer: Medicare Other | Admitting: Cardiology

## 2010-12-22 DIAGNOSIS — I5022 Chronic systolic (congestive) heart failure: Secondary | ICD-10-CM

## 2010-12-22 DIAGNOSIS — E785 Hyperlipidemia, unspecified: Secondary | ICD-10-CM

## 2010-12-22 DIAGNOSIS — N183 Chronic kidney disease, stage 3 unspecified: Secondary | ICD-10-CM

## 2010-12-22 DIAGNOSIS — I4892 Unspecified atrial flutter: Secondary | ICD-10-CM

## 2010-12-22 DIAGNOSIS — R0602 Shortness of breath: Secondary | ICD-10-CM

## 2010-12-22 DIAGNOSIS — I2510 Atherosclerotic heart disease of native coronary artery without angina pectoris: Secondary | ICD-10-CM

## 2010-12-22 LAB — BASIC METABOLIC PANEL
Chloride: 102 mEq/L (ref 96–112)
Creatinine, Ser: 1.4 mg/dL (ref 0.4–1.5)
Potassium: 4.4 mEq/L (ref 3.5–5.1)
Sodium: 139 mEq/L (ref 135–145)

## 2010-12-22 LAB — BRAIN NATRIURETIC PEPTIDE: Pro B Natriuretic peptide (BNP): 125 pg/mL — ABNORMAL HIGH (ref 0.0–100.0)

## 2010-12-22 MED ORDER — CARVEDILOL 12.5 MG PO TABS: 12.5000 mg | ORAL_TABLET | Freq: Two times a day (BID) | ORAL | Status: DC

## 2010-12-22 NOTE — Assessment & Plan Note (Signed)
Diffuse moderate to severe distal vessel disease on cath with ectatic coronaries with slow flow. No interventional target. He has never had chest pain. Continue ASA, statin, ACEI, beta blocker.

## 2010-12-22 NOTE — Assessment & Plan Note (Signed)
Lipids/LFTs at goal (LDL < 70).

## 2010-12-22 NOTE — Assessment & Plan Note (Signed)
Maintaining NSR. Continue warfarin. Ablation would be next step if it recurs.

## 2010-12-22 NOTE — Progress Notes (Signed)
PCP: Dr. Asa Lente  60 yo AAM with history of CAD and mixed ischemic/nonischemic cardiomyopathy (EF 15%), NYHA Class III CHF, and diabetes who presents for followup. He initially was hospitalized in 5/11 with acute decompensated CHF after several days of shortness of breath, orthopnea, and PND. He was diuresed and left/right heart catheterization was done. Left heart cath showed large, ectatic coronaries with slow flow and moderate to severe diffuse distal vessel disease (consistent with diabetes) without good interventional options. EF was 15-20% by echo. He was put on milrinone and further diuresed for several more days. He was titrated off milrinone and discharged on an oral medication regimen. He later developed atrial flutter requiring cardioversion. He remains in NSR today after cardioversion. Finally, patient had Medtronic ICD placed in 11/11.   Patient is stable today. He continues to have his chronic gait difficulty from diabetic neuropathy. He is feeling good in general and denies dyspnea on exertion though he is limited significantly by his foot problems.  I had him go through home PT and he says this was helpful.  No chest pain. Weight is up about 10 lbs.  Patient reports significant dietary noncompliance..  Labs (9/11): LDL 61, HDL 26, K 4.8, creatinine 1.6  Labs (10/11): K 4.3, creatinine 1.3, BNP 165  Labs (11/11): K 4.9, creatinine 1.6  Labs (12/11): K 4.8, creatinine 1.7  Labs (2/12): LDL 110, HDL 28  Labs (3/12): K 4.1, creatinine 1.7  Allergies:  1) ! Erythromycin   Past Medical History:  1. CAD: LHC (5/11) with 50-60% mid LAD, diffuse moderate distal LAD up to 60-70%, small OM2 subtotally occluded, moderate to severe diffuse distal PLOM disease, severe diffuse distal RCA, PDA, and PLV disease. Coronaries were ectatic with slow flow. Patient was started on Plavix given concern for thrombus formation in the ectatic coronaries. He stopped Plavix after starting coumadin with atrial  flutter.  2. CHF: Systolic dysfunction, likely mixed ischemic/nonischemic. Echo (5/11) with EF 15-20%, severe global hypokinesis, mild MR, moderate diastolic dysfunction, mildly decreased RV systolic function. RHC (5/11) with mean RA 14 mmHg, PA 38/26, mean PCWP 25 mmHg, CI 1.8. Repeat echo (8/11) with EF 0000000, severe diastolic dysfunction, mild MR, RV normal size and systolic function. Per official read, cannot rule out apical clot but it does appear to be only trabeculations. TEE (9/11): EF 15% with global hypokinesis, mild to moderate MR, no LV thrombus and no LAA thrombus.  3. Type II diabetes, uncontrolled  4. HTN  5. Smoker  6. severe diabetic neuropathy -with chronic gait dysfunction  7. History of left foot ulcer with poor healing  8. Atrial flutter s/p DCCV 9/11  9. Medtronic dual chamber ICD with Optivol (11/11)   Family History:  Family History of Colon CA 1st degree relative <60 9other relative)  Heart disease (grandparent)  Stroke (other relative)  dm only in secondary relatives   Social History:  Pt lives in [place]. Smoker, about 1/4 ppd.  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   Current Outpatient Prescriptions  Medication Sig Dispense Refill  . aspirin 81 MG tablet Take 81 mg by mouth daily.        . B Complex Vitamins (B COMPLEX 100 PO) Take 1 tablet by mouth daily as needed.        . fish oil-omega-3 fatty acids 1000 MG capsule Take 2  capsules (2 g total) by mouth daily.  60 capsule  11  . furosemide (LASIX) 40 MG tablet Take 40 mg by mouth daily. 1/2 po daily       . glipiZIDE (GLUCOTROL) 5 MG tablet Take 5 mg by mouth 2 (two) times daily before a meal.        . glucose blood test strip 1 each by Other route as needed. Use as instructed       . hydrALAZINE (APRESOLINE) 25 MG tablet Take 25 mg by mouth 3 (three) times daily.        .  isosorbide dinitrate (ISORDIL) 10 MG tablet Take 10 mg by mouth 3 (three) times daily.        Marland Kitchen lisinopril (PRINIVIL,ZESTRIL) 5 MG tablet Take 1 tablet (5 mg total) by mouth 2 (two) times daily.  60 tablet  3  . Multiple Vitamin (MULTIVITAMIN) tablet Take 1 tablet by mouth daily.        . rosuvastatin (CRESTOR) 40 MG tablet Take 40 mg by mouth daily.        Marland Kitchen spironolactone (ALDACTONE) 25 MG tablet Take 1 tablet (25 mg total) by mouth daily.  30 tablet  6  . warfarin (COUMADIN) 5 MG tablet Take as directed by the coumadin clinic.  50 tablet  2  . DISCONTD: carvedilol (COREG) 12.5 MG tablet Take 12.5 mg by mouth 2 (two) times daily with a meal.        . carvedilol (COREG) 12.5 MG tablet Take 1 tablet (12.5 mg total) by mouth 2 (two) times daily.  90 tablet  6    BP 127/85  Pulse 77  Resp 18  Ht 6\' 4"  (1.93 m)  Wt 258 lb (117.028 kg)  BMI 31.40 kg/m2 General: Well developed, well nourished, in no acute distress.  Neck: Neck supple, no JVD. No masses, thyromegaly or abnormal cervical nodes.  Lungs: Clear bilaterally to auscultation and percussion.  Heart: Non-displaced PMI, chest non-tender; regular rate and rhythm, S1, S2 without murmurs, rubs or gallops. Carotid upstroke normal, no bruit. I am unable to palpate his pedal pulses but feet are warm. No edema, no varicosities.  Abdomen: Bowel sounds positive; abdomen soft and non-tender without masses, organomegaly, or hernias noted. No hepatosplenomegaly.  Extremities: No clubbing or cyanosis.  Neurologic: Alert and oriented x 3.  Psych: Normal affect.

## 2010-12-22 NOTE — Patient Instructions (Addendum)
Increase Coreg(carvedilol) to 18.75mg  twice a day. This will be one and one-half 12.5mg   tablets twice a day.  Lab today--BMP/BNP 428.22  414.01  Schedule an appointment to see Dr Aundra Dubin in 3 months.

## 2010-12-22 NOTE — Assessment & Plan Note (Signed)
Systolic CHF. This may be multifactorial, due to significant distal vessel CAD as well as a history of poorly controlled diabetes. He has NYHA class II symptoms. He does not appear significantly volume overloaded on exam.   - Increase Coreg to 18.75 mg bid. - Continue current doses of Lasix, hydralazine, isordil, lisinopril, and spironolactone.  - Check BMET/BNP today.

## 2010-12-22 NOTE — Assessment & Plan Note (Signed)
Checking creatinine today.

## 2011-01-08 ENCOUNTER — Ambulatory Visit (INDEPENDENT_AMBULATORY_CARE_PROVIDER_SITE_OTHER): Payer: Medicare Other | Admitting: *Deleted

## 2011-01-08 DIAGNOSIS — I4892 Unspecified atrial flutter: Secondary | ICD-10-CM

## 2011-01-08 LAB — POCT INR: INR: 2.1

## 2011-01-30 ENCOUNTER — Other Ambulatory Visit: Payer: Self-pay | Admitting: Internal Medicine

## 2011-02-05 ENCOUNTER — Encounter: Payer: Self-pay | Admitting: Internal Medicine

## 2011-02-05 ENCOUNTER — Ambulatory Visit (INDEPENDENT_AMBULATORY_CARE_PROVIDER_SITE_OTHER): Payer: Medicare Other | Admitting: *Deleted

## 2011-02-05 DIAGNOSIS — I428 Other cardiomyopathies: Secondary | ICD-10-CM

## 2011-02-05 DIAGNOSIS — I4892 Unspecified atrial flutter: Secondary | ICD-10-CM

## 2011-02-05 DIAGNOSIS — I5022 Chronic systolic (congestive) heart failure: Secondary | ICD-10-CM

## 2011-02-05 DIAGNOSIS — I509 Heart failure, unspecified: Secondary | ICD-10-CM

## 2011-02-05 LAB — POCT INR: INR: 3.2

## 2011-02-05 NOTE — Progress Notes (Signed)
icd check with icm 

## 2011-03-10 ENCOUNTER — Ambulatory Visit (INDEPENDENT_AMBULATORY_CARE_PROVIDER_SITE_OTHER): Payer: Medicare Other | Admitting: *Deleted

## 2011-03-10 DIAGNOSIS — I4892 Unspecified atrial flutter: Secondary | ICD-10-CM

## 2011-03-10 LAB — POCT INR: INR: 1.9

## 2011-03-19 ENCOUNTER — Ambulatory Visit: Payer: Medicare Other | Admitting: Cardiology

## 2011-04-10 ENCOUNTER — Telehealth: Payer: Self-pay | Admitting: *Deleted

## 2011-04-10 ENCOUNTER — Ambulatory Visit (INDEPENDENT_AMBULATORY_CARE_PROVIDER_SITE_OTHER): Payer: Medicare Other | Admitting: Cardiology

## 2011-04-10 ENCOUNTER — Encounter: Payer: Self-pay | Admitting: Cardiology

## 2011-04-10 ENCOUNTER — Ambulatory Visit (INDEPENDENT_AMBULATORY_CARE_PROVIDER_SITE_OTHER): Payer: Medicare Other | Admitting: *Deleted

## 2011-04-10 DIAGNOSIS — I428 Other cardiomyopathies: Secondary | ICD-10-CM

## 2011-04-10 DIAGNOSIS — I5022 Chronic systolic (congestive) heart failure: Secondary | ICD-10-CM

## 2011-04-10 DIAGNOSIS — I4892 Unspecified atrial flutter: Secondary | ICD-10-CM

## 2011-04-10 DIAGNOSIS — I2510 Atherosclerotic heart disease of native coronary artery without angina pectoris: Secondary | ICD-10-CM

## 2011-04-10 DIAGNOSIS — R0602 Shortness of breath: Secondary | ICD-10-CM

## 2011-04-10 DIAGNOSIS — E785 Hyperlipidemia, unspecified: Secondary | ICD-10-CM

## 2011-04-10 LAB — BASIC METABOLIC PANEL
BUN: 35 mg/dL — ABNORMAL HIGH (ref 6–23)
Chloride: 104 mEq/L (ref 96–112)
GFR: 50.45 mL/min — ABNORMAL LOW (ref >60.00)
Potassium: 5 mEq/L (ref 3.5–5.1)
Sodium: 137 mEq/L (ref 135–145)

## 2011-04-10 LAB — POCT INR: INR: 3.2

## 2011-04-10 LAB — BRAIN NATRIURETIC PEPTIDE: Pro B Natriuretic peptide (BNP): 44 pg/mL (ref 0.0–100.0)

## 2011-04-10 MED ORDER — HYDRALAZINE HCL 25 MG PO TABS: 37.5000 mg | ORAL_TABLET | Freq: Three times a day (TID) | ORAL | Status: DC

## 2011-04-10 MED ORDER — FUROSEMIDE 40 MG PO TABS: 20.0000 mg | ORAL_TABLET | ORAL | Status: DC

## 2011-04-10 MED ORDER — ISOSORBIDE DINITRATE 10 MG PO TABS: 20.0000 mg | ORAL_TABLET | Freq: Three times a day (TID) | ORAL | Status: DC

## 2011-04-10 MED ORDER — CARVEDILOL 12.5 MG PO TABS: 18.7500 mg | ORAL_TABLET | Freq: Two times a day (BID) | ORAL | Status: DC

## 2011-04-10 NOTE — Telephone Encounter (Signed)
Lasix decreased to 20mg  every otherday and gave list by phone of high potassium rich foods to avoid. App made for lab draw. Pt verbalized understanding. Corwin Levins RN

## 2011-04-10 NOTE — Telephone Encounter (Signed)
Message copied by Jonathon Jordan on Fri Apr 10, 2011  5:02 PM ------      Message from: Larey Dresser      Created: Fri Apr 10, 2011  4:41 PM       Creatinine is higher.  Would have him adjust his Lasix to every other day rather than daily.  Stop any KCl supplement and follow low K diet.  Repeat BMET in 10 days.

## 2011-04-10 NOTE — Patient Instructions (Addendum)
Your physician wants you to follow-up in:3 months, You will receive a reminder letter in the mail two months in advance. If you don't receive a letter, please call our office to schedule the follow-up appointment.   Your physician has requested that you have an echocardiogram. Echocardiography is a painless test that uses sound waves to create images of your heart. It provides your doctor with information about the size and shape of your heart and how well your heart's chambers and valves are working. This procedure takes approximately one hour. There are no restrictions for this procedure. 3 months but  prior to f/u app.  Your physician recommends that you have lab work today: bmet bmp  PLEASE HAVE YOUR LIPIDS/LABS SENT TO DR Percy.   Your physician has recommended you make the following change in your medication:  1) increase hydralazine to 37.5 mg 3 times a day 2) increase isosorbide to 20 mg  3 times a day.  3) coreg was adjusted/ no change but reflects what you take.

## 2011-04-12 NOTE — Progress Notes (Signed)
PCP: Dr. Asa Lente  60 yo AAM with history of CAD and mixed ischemic/nonischemic cardiomyopathy (EF 15%), NYHA Class III CHF, and diabetes who presents for followup. He initially was hospitalized in 5/11 with acute decompensated CHF after several days of shortness of breath, orthopnea, and PND. He was diuresed and left/right heart catheterization was done. Left heart cath showed large, ectatic coronaries with slow flow and moderate to severe diffuse distal vessel disease (consistent with diabetes) without good interventional options. EF was 15-20% by echo. He was put on milrinone and further diuresed for several more days. He was titrated off milrinone and discharged on an oral medication regimen. He later developed atrial flutter requiring cardioversion. He remains in NSR. Finally, patient had Medtronic ICD placed in 11/11.   Patient is stable today. He continues to have his chronic gait difficulty from diabetic neuropathy. He is feeling good in general and denies dyspnea on exertion though he is limited significantly by his foot problems. Weight is up 5 lbs since last appointment.  He does not feel like he is retaining fluid, but he is very inactive.  No chest pain.  He has noticed congestion and cough at night when he eats salty foods such as potato chips.  Labs (9/11): LDL 61, HDL 26, K 4.8, creatinine 1.6  Labs (10/11): K 4.3, creatinine 1.3, BNP 165  Labs (11/11): K 4.9, creatinine 1.6  Labs (12/11): K 4.8, creatinine 1.7  Labs (2/12): LDL 110, HDL 28  Labs (3/12): K 4.1, creatinine 1.7 Labs (6/12): K 4.4, creatinine 1.4, BNP 125  ECG: NSR, left axis, old inferior MI, old anterior MI  Allergies:  1) ! Erythromycin   Past Medical History:  1. CAD: LHC (5/11) with 50-60% mid LAD, diffuse moderate distal LAD up to 60-70%, small OM2 subtotally occluded, moderate to severe diffuse distal PLOM disease, severe diffuse distal RCA, PDA, and PLV disease. Coronaries were ectatic with slow flow. Patient  was started on Plavix given concern for thrombus formation in the ectatic coronaries. He stopped Plavix after starting coumadin with atrial flutter.  2. CHF: Systolic dysfunction, likely mixed ischemic/nonischemic. Echo (5/11) with EF 15-20%, severe global hypokinesis, mild MR, moderate diastolic dysfunction, mildly decreased RV systolic function. RHC (5/11) with mean RA 14 mmHg, PA 38/26, mean PCWP 25 mmHg, CI 1.8. Repeat echo (8/11) with EF 0000000, severe diastolic dysfunction, mild MR, RV normal size and systolic function. Per official read, cannot rule out apical clot but it does appear to be only trabeculations. TEE (9/11): EF 15% with global hypokinesis, mild to moderate MR, no LV thrombus and no LAA thrombus.  3. Type II diabetes, uncontrolled  4. HTN  5. Smoker  6. severe diabetic neuropathy -with chronic gait dysfunction  7. History of left foot ulcer with poor healing  8. Atrial flutter s/p DCCV 9/11  9. Medtronic dual chamber ICD with Optivol (11/11)   Family History:  Family History of Colon CA 1st degree relative <60 9other relative)  Heart disease (grandparent)  Stroke (other relative)  dm only in secondary relatives   Social History:  Pt lives in [place]. Smoker, about 1/4 ppd.  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   Current Outpatient Prescriptions  Medication Sig Dispense Refill  . aspirin 81 MG tablet Take 81 mg by mouth daily.        Marland Kitchen B  Complex Vitamins (B COMPLEX 100 PO) Take 1 tablet by mouth daily as needed.        . carvedilol (COREG) 12.5 MG tablet Take 1.5 tablets (18.75 mg total) by mouth 2 (two) times daily.  90 tablet  6  . fish oil-omega-3 fatty acids 1000 MG capsule Take 2 capsules (2 g total) by mouth daily.  60 capsule  11  . glipiZIDE (GLUCOTROL XL) 5 MG 24 hr tablet TAKE ONE TABLET BY MOUTH TWICE DAILY  60 tablet  5  .  glipiZIDE (GLUCOTROL) 5 MG tablet Take 5 mg by mouth 2 (two) times daily before a meal.        . glucose blood test strip 1 each by Other route as needed. Use as instructed       . hydrALAZINE (APRESOLINE) 25 MG tablet Take 1.5 tablets (37.5 mg total) by mouth 3 (three) times daily.  90 tablet  5  . isosorbide dinitrate (ISORDIL) 10 MG tablet Take 2 tablets (20 mg total) by mouth 3 (three) times daily.  90 tablet  5  . lisinopril (PRINIVIL,ZESTRIL) 5 MG tablet Take 1 tablet (5 mg total) by mouth 2 (two) times daily.  60 tablet  3  . Multiple Vitamin (MULTIVITAMIN) tablet Take 1 tablet by mouth daily.        . rosuvastatin (CRESTOR) 40 MG tablet Take 40 mg by mouth daily.        Marland Kitchen spironolactone (ALDACTONE) 25 MG tablet Take 1 tablet (25 mg total) by mouth daily.  30 tablet  6  . warfarin (COUMADIN) 5 MG tablet Take as directed by the coumadin clinic.  50 tablet  2  . furosemide (LASIX) 40 MG tablet Take 0.5 tablets (20 mg total) by mouth every other day.        BP 118/58  Pulse 68  Ht 6\' 5"  (1.956 m)  Wt 263 lb (119.296 kg)  BMI 31.19 kg/m2 General: Well developed, well nourished, in no acute distress.  Neck: Neck supple, no JVD. No masses, thyromegaly or abnormal cervical nodes.  Lungs: Clear bilaterally to auscultation and percussion.  Heart: Non-displaced PMI, chest non-tender; regular rate and rhythm, S1, S2 without murmurs, rubs or gallops. Carotid upstroke normal, no bruit. I am unable to palpate his pedal pulses but feet are warm. No edema, no varicosities.  Abdomen: Bowel sounds positive; abdomen soft and non-tender without masses, organomegaly, or hernias noted. No hepatosplenomegaly.  Extremities: No clubbing or cyanosis.  Neurologic: Alert and oriented x 3.  Psych: Normal affect.

## 2011-04-12 NOTE — Assessment & Plan Note (Signed)
Systolic CHF. This may be multifactorial, due to significant distal vessel CAD as well as a history of poorly controlled diabetes. He has NYHA class II symptoms. He does not appear significantly volume overloaded on exam.   - Continue Coreg 18.75 mg bid, lisinopril 5 mg bid, spironolactone 25 mg daily, and Lasix 20 mg daily. - Increase hydralazine to 37.5 mg tid and isordil to 20 mg tid.  - Check BMET/BNP today.  - Return in 3 months, will get echo at that time.

## 2011-04-12 NOTE — Assessment & Plan Note (Signed)
Diffuse moderate to severe distal vessel disease on cath with ectatic coronaries with slow flow. No interventional target. He has never had chest pain. Continue ASA, statin, ACEI, beta blocker.

## 2011-04-12 NOTE — Assessment & Plan Note (Signed)
Maintaining NSR. Continue warfarin. Ablation would be next step if it recurs.

## 2011-04-12 NOTE — Assessment & Plan Note (Signed)
Lipids/LFTs at goal (LDL < 70) when last checked.Marland Kitchen

## 2011-04-15 ENCOUNTER — Other Ambulatory Visit: Payer: Self-pay | Admitting: Cardiology

## 2011-04-15 LAB — I-STAT 8, (EC8 V) (CONVERTED LAB)
Acid-Base Excess: 1
Bicarbonate: 27.2 — ABNORMAL HIGH
HCT: 49
Operator id: 239701
pCO2, Ven: 46.9
pH, Ven: 7.372 — ABNORMAL HIGH

## 2011-04-15 LAB — COMPREHENSIVE METABOLIC PANEL
ALT: 39
AST: 26
Alkaline Phosphatase: 77
CO2: 27
Calcium: 9.6
Chloride: 100
GFR calc non Af Amer: 45 — ABNORMAL LOW
Glucose, Bld: 268 — ABNORMAL HIGH
Potassium: 4.4
Sodium: 135
Total Bilirubin: 0.6

## 2011-04-17 ENCOUNTER — Other Ambulatory Visit: Payer: Self-pay | Admitting: Cardiology

## 2011-04-23 ENCOUNTER — Other Ambulatory Visit (INDEPENDENT_AMBULATORY_CARE_PROVIDER_SITE_OTHER): Payer: Medicare Other | Admitting: *Deleted

## 2011-04-23 ENCOUNTER — Telehealth: Payer: Self-pay | Admitting: *Deleted

## 2011-04-23 DIAGNOSIS — I4892 Unspecified atrial flutter: Secondary | ICD-10-CM

## 2011-04-23 DIAGNOSIS — I5022 Chronic systolic (congestive) heart failure: Secondary | ICD-10-CM

## 2011-04-23 DIAGNOSIS — I428 Other cardiomyopathies: Secondary | ICD-10-CM

## 2011-04-23 LAB — BASIC METABOLIC PANEL
BUN: 25 mg/dL — ABNORMAL HIGH (ref 6–23)
CO2: 26 mEq/L (ref 19–32)
Chloride: 106 mEq/L (ref 96–112)
Creatinine, Ser: 1.4 mg/dL (ref 0.4–1.5)
Potassium: 4.8 mEq/L (ref 3.5–5.1)

## 2011-04-23 MED ORDER — ISOSORBIDE DINITRATE 20 MG PO TABS: 20.0000 mg | ORAL_TABLET | Freq: Three times a day (TID) | ORAL | Status: DC

## 2011-04-23 MED ORDER — HYDRALAZINE HCL 25 MG PO TABS: ORAL_TABLET | ORAL | Status: DC

## 2011-04-23 NOTE — Telephone Encounter (Signed)
Pt needs refills on hydralazine and isordil since doses were increased 04/10/11.

## 2011-04-30 ENCOUNTER — Ambulatory Visit (HOSPITAL_COMMUNITY): Payer: Medicare Other | Attending: Cardiovascular Disease | Admitting: Radiology

## 2011-04-30 DIAGNOSIS — I080 Rheumatic disorders of both mitral and aortic valves: Secondary | ICD-10-CM | POA: Insufficient documentation

## 2011-04-30 DIAGNOSIS — I2510 Atherosclerotic heart disease of native coronary artery without angina pectoris: Secondary | ICD-10-CM

## 2011-04-30 DIAGNOSIS — I428 Other cardiomyopathies: Secondary | ICD-10-CM

## 2011-04-30 DIAGNOSIS — I5022 Chronic systolic (congestive) heart failure: Secondary | ICD-10-CM

## 2011-04-30 DIAGNOSIS — I10 Essential (primary) hypertension: Secondary | ICD-10-CM | POA: Insufficient documentation

## 2011-04-30 DIAGNOSIS — E119 Type 2 diabetes mellitus without complications: Secondary | ICD-10-CM | POA: Insufficient documentation

## 2011-04-30 DIAGNOSIS — I4892 Unspecified atrial flutter: Secondary | ICD-10-CM

## 2011-04-30 DIAGNOSIS — I319 Disease of pericardium, unspecified: Secondary | ICD-10-CM | POA: Insufficient documentation

## 2011-04-30 DIAGNOSIS — R0602 Shortness of breath: Secondary | ICD-10-CM

## 2011-05-04 ENCOUNTER — Telehealth: Payer: Self-pay | Admitting: Cardiology

## 2011-05-04 NOTE — Telephone Encounter (Signed)
I talked with pt about results of recent echocardiogram.

## 2011-05-04 NOTE — Telephone Encounter (Signed)
Pt returning call from our office. Please call back.

## 2011-05-07 ENCOUNTER — Encounter: Payer: Medicare Other | Admitting: *Deleted

## 2011-05-14 ENCOUNTER — Encounter: Payer: Self-pay | Admitting: Internal Medicine

## 2011-05-14 ENCOUNTER — Ambulatory Visit (INDEPENDENT_AMBULATORY_CARE_PROVIDER_SITE_OTHER): Payer: Medicare Other | Admitting: *Deleted

## 2011-05-14 ENCOUNTER — Ambulatory Visit (INDEPENDENT_AMBULATORY_CARE_PROVIDER_SITE_OTHER): Payer: Medicare Other | Admitting: Internal Medicine

## 2011-05-14 DIAGNOSIS — I5022 Chronic systolic (congestive) heart failure: Secondary | ICD-10-CM

## 2011-05-14 DIAGNOSIS — I10 Essential (primary) hypertension: Secondary | ICD-10-CM

## 2011-05-14 DIAGNOSIS — I4892 Unspecified atrial flutter: Secondary | ICD-10-CM

## 2011-05-14 DIAGNOSIS — I428 Other cardiomyopathies: Secondary | ICD-10-CM

## 2011-05-14 LAB — ICD DEVICE OBSERVATION
AL IMPEDENCE ICD: 437 Ohm
AL THRESHOLD: 1.25 V
ATRIAL PACING ICD: 30.86 pct
CHARGE TIME: 9.208 s
DEV-0020ICD: NEGATIVE
PACEART VT: 0
TOT-0001: 1
TOT-0002: 0
TOT-0006: 20111108000000
TZAT-0001ATACH: 1
TZAT-0001SLOWVT: 1
TZAT-0001SLOWVT: 2
TZAT-0002ATACH: NEGATIVE
TZAT-0002FASTVT: NEGATIVE
TZAT-0002SLOWVT: NEGATIVE
TZAT-0012ATACH: 150 ms
TZAT-0012ATACH: 150 ms
TZAT-0018SLOWVT: NEGATIVE
TZAT-0019ATACH: 6 V
TZAT-0019FASTVT: 8 V
TZAT-0019SLOWVT: 8 V
TZAT-0020ATACH: 1.5 ms
TZAT-0020ATACH: 1.5 ms
TZON-0003ATACH: 390 ms
TZON-0004SLOWVT: 16
TZON-0005SLOWVT: 12
TZST-0001ATACH: 4
TZST-0001ATACH: 5
TZST-0001FASTVT: 3
TZST-0001SLOWVT: 4
TZST-0001SLOWVT: 5
TZST-0002ATACH: NEGATIVE
TZST-0002FASTVT: NEGATIVE
TZST-0002FASTVT: NEGATIVE
TZST-0002FASTVT: NEGATIVE
TZST-0002FASTVT: NEGATIVE
TZST-0002SLOWVT: NEGATIVE
TZST-0002SLOWVT: NEGATIVE
VENTRICULAR PACING ICD: 0.04 pct

## 2011-05-14 MED ORDER — FUROSEMIDE 40 MG PO TABS: 20.0000 mg | ORAL_TABLET | Freq: Every day | ORAL | Status: DC

## 2011-05-14 NOTE — Assessment & Plan Note (Signed)
Stable No change required today  

## 2011-05-14 NOTE — Assessment & Plan Note (Signed)
Maintaining sinus  Continue coumadin No change today

## 2011-05-14 NOTE — Assessment & Plan Note (Signed)
Mild volume overload on exam and elevated optivol I have spoken with Dr Aundra Dubin We will increase lasix to 20mg  daily Return to see Dr Aundra Dubin in 4 weeks  Normal ICD function See Claudia Desanctis Art report No changes today

## 2011-05-14 NOTE — Patient Instructions (Addendum)
Your physician wants you to follow-up in: 4 weeks with Dr Aundra Dubin, 3 months with device clinic and 12 months with Dr Vallery Ridge will receive a reminder letter in the mail two months in advance. If you don't receive a letter, please call our office to schedule the follow-up appointment.  Your physician has recommended you make the following change in your medication:  1)increase Furosemide to 20mg  daily

## 2011-05-14 NOTE — Progress Notes (Signed)
The patient presents today for routine electrophysiology followup.  Since last being seen in our clinic, the patient reports doing very well.  He has occasional dizziness.  He is mostly limited by unsteadiness with neuropathy.  Today, he denies symptoms of palpitations, chest pain, shortness of breath, orthopnea, PND, lower extremity edema,  presyncope, syncope, or neurologic sequela.  The patient feels that he is tolerating medications without difficulties and is otherwise without complaint today.   Past Medical History  Diagnosis Date  . CAD (coronary artery disease)     LHC (5/11) with 50-60% mid LAD, diffuse moderate distal LAD up to 60-70%, small OM2 subtotally  occluded, moderate to severe diffuse distal PLOM disease, severe diffuse distal RCA, PDA, and PLV  disease.  Coronaries were ectatic with slow flow.  Patient was started on Plavix given concern for thrombus  formation in the ectatic coronaries.  He stopped Plavix after starting coumadin with atrial flut  . CHF (congestive heart failure)     Systolic dysfunction, likely mixed ischemic/nonischemic.  Echo (5/11) with EF 15-20%, severe  global hypokinesis, mild MR, moderate diastolic dysfunction, mildly decreased RV systolic function.  RHC  (5/11) with mean RA 14 mmHg, PA 38/26, mean PCWP 25 mmHg, CI 1.8.  Repeat echo (8/11) with EF 15- 123456, severe diastolic dysfunction, mild MR, RV normal size and systolic function.    Marland Kitchen Uncontrolled type II diabetes mellitus   . HTN (hypertension)   . Diabetic neuropathy     severe; with chronic gait dysfunction  . Foot ulcer, left     poor healing  . Atrial flutter     s/p DCCV 9/11  . ICD (implantable cardiac defibrillator) in place 11/11    Medtronic dual cha,ber ICD with Optivol   Past Surgical History  Procedure Date  . Icd implantation 2011    by [facility]    Current Outpatient Prescriptions  Medication Sig Dispense Refill  . aspirin 81 MG tablet Take 81 mg by mouth daily.        . B Complex  Vitamins (B COMPLEX 100 PO) Take 1 tablet by mouth daily as needed.        . carvedilol (COREG) 12.5 MG tablet Take 1.5 tablets (18.75 mg total) by mouth 2 (two) times daily.  90 tablet  6  . fish oil-omega-3 fatty acids 1000 MG capsule Take 2 capsules (2 g total) by mouth daily.  60 capsule  11  . furosemide (LASIX) 40 MG tablet Take 0.5 tablets (20 mg total) by mouth every other day.      Marland Kitchen glipiZIDE (GLUCOTROL XL) 5 MG 24 hr tablet TAKE ONE TABLET BY MOUTH TWICE DAILY  60 tablet  5  . glucose blood test strip 1 each by Other route as needed. Use as instructed       . hydrALAZINE (APRESOLINE) 25 MG tablet Take one and one-half tablets three times a day  135 tablet  6  . isosorbide dinitrate (ISORDIL) 20 MG tablet Take 1 tablet (20 mg total) by mouth 3 (three) times daily.  90 tablet  6  . lisinopril (PRINIVIL,ZESTRIL) 5 MG tablet Take 1 tablet (5 mg total) by mouth 2 (two) times daily.  60 tablet  3  . Multiple Vitamin (MULTIVITAMIN) tablet Take 1 tablet by mouth daily.        . rosuvastatin (CRESTOR) 40 MG tablet Take 40 mg by mouth daily.        Marland Kitchen spironolactone (ALDACTONE) 25 MG tablet Take 1 tablet (  25 mg total) by mouth daily.  30 tablet  6  . warfarin (COUMADIN) 5 MG tablet TAKE AS DIRECTED BY COUMADIN CLINIC  50 tablet  3    Allergies  Allergen Reactions  . Erythromycin     REACTION: made toes \\T \ finger tingling    History   Social History  . Marital Status: Married    Spouse Name: N/A    Number of Children: N/A  . Years of Education: N/A   Occupational History  . Retired Barrister's clerk   . Former [employer]    Social History Main Topics  . Smoking status: Current Everyday Smoker  . Smokeless tobacco: Not on file   Comment: about 1/4 ppd. Married, lives with wife. 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 Alcohol Use: No  . Drug Use: Not on file  . Sexually Active: Not on file   Other Topics Concern  . Not on file   Social History Narrative  .  No narrative on file    Family History  Problem Relation Age of Onset  . Colon cancer    . Heart disease    . Stroke    . Diabetes      ROS-  All systems are reviewed and are negative except as outlined in the HPI above   Physical Exam: Filed Vitals:   05/14/11 0849  BP: 114/72  Pulse: 60  Height: 6\' 4"  (1.93 m)  Weight: 259 lb (117.482 kg)    GEN- The patient is well appearing, alert and oriented x 3 today.   Head- normocephalic, atraumatic Eyes-  Sclera clear, conjunctiva pink Ears- hearing intact Oropharynx- clear Neck- supple, JVP 9cm Lymph- no cervical lymphadenopathy Lungs- Clear to ausculation bilaterally, normal work of breathing Chest- ICD pocket is well healed Heart- Regular rate and rhythm, no murmurs, rubs or gallops, PMI not laterally displaced GI- soft, NT, ND, + BS Extremities- no clubbing, cyanosis, or edema MS- no significant deformity or atrophy Skin- no rash or lesion Psych- euthymic mood, full affect Neuro- strength and sensation are intact  ICD interrogation- reviewed in detail today,  See PACEART report  Assessment and Plan:

## 2011-06-11 ENCOUNTER — Encounter: Payer: Medicare Other | Admitting: *Deleted

## 2011-06-16 ENCOUNTER — Encounter: Payer: Self-pay | Admitting: Cardiology

## 2011-06-16 ENCOUNTER — Ambulatory Visit (INDEPENDENT_AMBULATORY_CARE_PROVIDER_SITE_OTHER): Payer: Medicare Other | Admitting: *Deleted

## 2011-06-16 ENCOUNTER — Ambulatory Visit (INDEPENDENT_AMBULATORY_CARE_PROVIDER_SITE_OTHER): Payer: Medicare Other | Admitting: Cardiology

## 2011-06-16 ENCOUNTER — Encounter: Payer: Self-pay | Admitting: Internal Medicine

## 2011-06-16 VITALS — BP 90/60 | HR 69 | Ht 76.0 in | Wt 256.0 lb

## 2011-06-16 DIAGNOSIS — E785 Hyperlipidemia, unspecified: Secondary | ICD-10-CM

## 2011-06-16 DIAGNOSIS — R0602 Shortness of breath: Secondary | ICD-10-CM

## 2011-06-16 DIAGNOSIS — I4892 Unspecified atrial flutter: Secondary | ICD-10-CM

## 2011-06-16 DIAGNOSIS — I509 Heart failure, unspecified: Secondary | ICD-10-CM

## 2011-06-16 DIAGNOSIS — I5022 Chronic systolic (congestive) heart failure: Secondary | ICD-10-CM

## 2011-06-16 DIAGNOSIS — I2510 Atherosclerotic heart disease of native coronary artery without angina pectoris: Secondary | ICD-10-CM

## 2011-06-16 LAB — ICD DEVICE OBSERVATION
DEV-0020ICD: NEGATIVE
TZAT-0001ATACH: 2
TZAT-0001ATACH: 3
TZAT-0001SLOWVT: 1
TZAT-0002ATACH: NEGATIVE
TZAT-0002SLOWVT: NEGATIVE
TZAT-0012ATACH: 150 ms
TZAT-0012ATACH: 150 ms
TZAT-0012ATACH: 150 ms
TZAT-0012FASTVT: 170 ms
TZAT-0018ATACH: NEGATIVE
TZAT-0018FASTVT: NEGATIVE
TZAT-0018SLOWVT: NEGATIVE
TZAT-0018SLOWVT: NEGATIVE
TZAT-0019ATACH: 6 V
TZAT-0019FASTVT: 8 V
TZAT-0019SLOWVT: 8 V
TZAT-0019SLOWVT: 8 V
TZAT-0020ATACH: 1.5 ms
TZAT-0020ATACH: 1.5 ms
TZAT-0020FASTVT: 1.5 ms
TZAT-0020SLOWVT: 1.5 ms
TZON-0003ATACH: 390 ms
TZON-0003VSLOWVT: 340 ms
TZON-0004VSLOWVT: 32
TZST-0001ATACH: 4
TZST-0001ATACH: 6
TZST-0001FASTVT: 5
TZST-0001FASTVT: 6
TZST-0001SLOWVT: 4
TZST-0001SLOWVT: 6
TZST-0002ATACH: NEGATIVE
TZST-0002ATACH: NEGATIVE
TZST-0002FASTVT: NEGATIVE
TZST-0002FASTVT: NEGATIVE
TZST-0002SLOWVT: NEGATIVE

## 2011-06-16 LAB — BASIC METABOLIC PANEL
CO2: 26 mEq/L (ref 19–32)
Calcium: 9.1 mg/dL (ref 8.4–10.5)
Glucose, Bld: 172 mg/dL — ABNORMAL HIGH (ref 70–99)
Sodium: 140 mEq/L (ref 135–145)

## 2011-06-16 NOTE — Assessment & Plan Note (Signed)
Systolic CHF. This may be multifactorial, due to significant distal vessel CAD as well as a history of poorly controlled diabetes. He has NYHA class II symptoms. He does not appear significantly volume overloaded on exam.  Optivol is improved after increasing Lasix to 20 mg daily and weight is down.  - Continue Coreg 18.75 mg bid, lisinopril 5 mg bid, spironolactone 25 mg daily, and Lasix 20 mg daily.  Continue hydralazine to 37.5 mg tid and isordil to 20 mg tid.  Minimal BP room for med titration.  - Check BMET/BNP today.  - Return in 4 months.

## 2011-06-16 NOTE — Assessment & Plan Note (Signed)
Lipids/LFTs at goal (LDL < 70) when last checked.Marland Kitchen

## 2011-06-16 NOTE — Progress Notes (Signed)
PCP: Dr. Asa Lente  60 yo AAM with history of CAD and mixed ischemic/nonischemic cardiomyopathy (EF 15%), NYHA Class III CHF, and diabetes who presents for followup. He initially was hospitalized in 5/11 with acute decompensated CHF after several days of shortness of breath, orthopnea, and PND. He was diuresed and left/right heart catheterization was done. Left heart cath showed large, ectatic coronaries with slow flow and moderate to severe diffuse distal vessel disease (consistent with diabetes) without good interventional options. EF was 15-20% by echo. He was put on milrinone and further diuresed for several more days. He was titrated off milrinone and discharged on an oral medication regimen. He later developed atrial flutter requiring cardioversion. He remains in NSR. Finally, patient had Medtronic ICD placed in 11/11.   Patient saw Dr. Rayann Heman a month or so ago and Optivol impedance was noted to have decreased.  Lasix was increased to 20 mg daily rather than every other day.  He has lost 7 lbs since I saw him last.  Repeat echo in 10/12 showed remains 25%.  Patient is stable today. He continues to have his chronic gait difficulty from diabetic neuropathy. He is feeling good in general and denies dyspnea on exertion though he is limited significantly by his foot problems.  No chest pain.  Very rare orthostatic-tye symptoms.  I rechecked his Optivol today, and impedance has gone back up with increased Lasix.   Labs (9/11): LDL 61, HDL 26, K 4.8, creatinine 1.6  Labs (10/11): K 4.3, creatinine 1.3, BNP 165  Labs (11/11): K 4.9, creatinine 1.6  Labs (12/11): K 4.8, creatinine 1.7  Labs (2/12): LDL 110, HDL 28  Labs (3/12): K 4.1, creatinine 1.7 Labs (6/12): K 4.4, creatinine 1.4, BNP 125 Labs (10/12): K 4.8, creatinine 1.4  Allergies:  1) ! Erythromycin   Past Medical History:  1. CAD: LHC (5/11) with 50-60% mid LAD, diffuse moderate distal LAD up to 60-70%, small OM2 subtotally occluded,  moderate to severe diffuse distal PLOM disease, severe diffuse distal RCA, PDA, and PLV disease. Coronaries were ectatic with slow flow. Patient was started on Plavix given concern for thrombus formation in the ectatic coronaries. He stopped Plavix after starting coumadin with atrial flutter.  2. CHF: Systolic dysfunction, likely mixed ischemic/nonischemic. Echo (5/11) with EF 15-20%, severe global hypokinesis, mild MR, moderate diastolic dysfunction, mildly decreased RV systolic function. RHC (5/11) with mean RA 14 mmHg, PA 38/26, mean PCWP 25 mmHg, CI 1.8. Repeat echo (8/11) with EF 0000000, severe diastolic dysfunction, mild MR, RV normal size and systolic function. Per official read, cannot rule out apical clot but it does appear to be only trabeculations. TEE (9/11): EF 15% with global hypokinesis, mild to moderate MR, no LV thrombus and no LAA thrombus.  3. Type II diabetes, uncontrolled  4. HTN  5. Smoker  6. severe diabetic neuropathy -with chronic gait dysfunction  7. History of left foot ulcer with poor healing  8. Atrial flutter s/p DCCV 9/11  9. Medtronic dual chamber ICD with Optivol (11/11)   Family History:  Family History of Colon CA 1st degree relative <60 9other relative)  Heart disease (grandparent)  Stroke (other relative)  dm only in secondary relatives   Social History:  Pt lives in [place]. Smoker, about 1/4 ppd.  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 aspirin 81 MG tablet Take 81 mg by mouth  daily.        . carvedilol (COREG) 12.5 MG tablet Take 1.5 tablets (18.75 mg total) by mouth 2 (two) times daily.  90 tablet  6  . furosemide (LASIX) 40 MG tablet Take 0.5 tablets (20 mg total) by mouth daily.      Marland Kitchen glipiZIDE (GLUCOTROL XL) 5 MG 24 hr tablet TAKE ONE TABLET BY MOUTH TWICE DAILY  60 tablet  5  . glucose blood test strip 1 each by Other  route as needed. Use as instructed       . hydrALAZINE (APRESOLINE) 25 MG tablet Take one and one-half tablets three times a day  135 tablet  6  . isosorbide dinitrate (ISORDIL) 20 MG tablet Take 1 tablet (20 mg total) by mouth 3 (three) times daily.  90 tablet  6  . lisinopril (PRINIVIL,ZESTRIL) 5 MG tablet Take 1 tablet (5 mg total) by mouth 2 (two) times daily.  60 tablet  3  . rosuvastatin (CRESTOR) 40 MG tablet Take 40 mg by mouth daily.        Marland Kitchen spironolactone (ALDACTONE) 25 MG tablet Take 1 tablet (25 mg total) by mouth daily.  30 tablet  6  . warfarin (COUMADIN) 5 MG tablet TAKE AS DIRECTED BY COUMADIN CLINIC  50 tablet  3  . B Complex Vitamins (B COMPLEX 100 PO) Take 1 tablet by mouth daily as needed.        . fish oil-omega-3 fatty acids 1000 MG capsule Take 2 capsules (2 g total) by mouth daily.  60 capsule  11  . Multiple Vitamin (MULTIVITAMIN) tablet Take 1 tablet by mouth daily.          BP 90/60  Pulse 69  Ht 6\' 4"  (1.93 m)  Wt 116.121 kg (256 lb)  BMI 31.16 kg/m2 General: Well developed, well nourished, in no acute distress.  Neck: Neck supple, no JVD. No masses, thyromegaly or abnormal cervical nodes.  Lungs: Clear bilaterally to auscultation and percussion.  Heart: Non-displaced PMI, chest non-tender; regular rate and rhythm, S1, S2 without murmurs, rubs or gallops. Carotid upstroke normal, no bruit. I am unable to palpate his pedal pulses but feet are warm. No edema, no varicosities.  Abdomen: Bowel sounds positive; abdomen soft and non-tender without masses, organomegaly, or hernias noted. No hepatosplenomegaly.  Extremities: No clubbing or cyanosis.  Neurologic: Alert and oriented x 3.  Psych: Normal affect.

## 2011-06-16 NOTE — Assessment & Plan Note (Signed)
Diffuse moderate to severe distal vessel disease on cath with ectatic coronaries with slow flow. No interventional target. He has never had chest pain. Continue ASA, statin, ACEI, beta blocker

## 2011-06-16 NOTE — Assessment & Plan Note (Signed)
Maintaining NSR. Continue warfarin. Ablation would be next step if it recurs.

## 2011-06-16 NOTE — Patient Instructions (Addendum)
Your physician recommends that you have lab work today  Your physician wants you to follow-up in: 4 months You will receive a reminder letter in the mail two months in advance. If you don't receive a letter, please call our office to schedule the follow-up appointment.

## 2011-07-16 ENCOUNTER — Ambulatory Visit (INDEPENDENT_AMBULATORY_CARE_PROVIDER_SITE_OTHER): Payer: Medicare Other | Admitting: *Deleted

## 2011-07-16 DIAGNOSIS — I4892 Unspecified atrial flutter: Secondary | ICD-10-CM

## 2011-07-16 LAB — POCT INR: INR: 2.8

## 2011-07-17 ENCOUNTER — Encounter: Payer: Medicare Other | Admitting: *Deleted

## 2011-08-20 ENCOUNTER — Ambulatory Visit (INDEPENDENT_AMBULATORY_CARE_PROVIDER_SITE_OTHER): Payer: Medicare Other | Admitting: *Deleted

## 2011-08-20 DIAGNOSIS — I428 Other cardiomyopathies: Secondary | ICD-10-CM | POA: Diagnosis not present

## 2011-08-20 DIAGNOSIS — I5022 Chronic systolic (congestive) heart failure: Secondary | ICD-10-CM

## 2011-08-20 DIAGNOSIS — I509 Heart failure, unspecified: Secondary | ICD-10-CM

## 2011-08-20 DIAGNOSIS — I4892 Unspecified atrial flutter: Secondary | ICD-10-CM | POA: Diagnosis not present

## 2011-08-20 LAB — POCT INR: INR: 2.5

## 2011-08-20 NOTE — Progress Notes (Signed)
ICD check with ICM 

## 2011-08-27 ENCOUNTER — Encounter: Payer: Medicare Other | Admitting: *Deleted

## 2011-09-15 ENCOUNTER — Other Ambulatory Visit: Payer: Self-pay | Admitting: Cardiology

## 2011-09-21 ENCOUNTER — Encounter: Payer: Self-pay | Admitting: Internal Medicine

## 2011-10-01 ENCOUNTER — Ambulatory Visit (INDEPENDENT_AMBULATORY_CARE_PROVIDER_SITE_OTHER): Payer: Medicare Other | Admitting: Cardiology

## 2011-10-01 ENCOUNTER — Ambulatory Visit (INDEPENDENT_AMBULATORY_CARE_PROVIDER_SITE_OTHER): Payer: Medicare Other | Admitting: Pharmacist

## 2011-10-01 ENCOUNTER — Encounter: Payer: Self-pay | Admitting: Cardiology

## 2011-10-01 VITALS — BP 90/58 | HR 72 | Ht 76.0 in | Wt 257.0 lb

## 2011-10-01 DIAGNOSIS — I4892 Unspecified atrial flutter: Secondary | ICD-10-CM | POA: Diagnosis not present

## 2011-10-01 DIAGNOSIS — I5022 Chronic systolic (congestive) heart failure: Secondary | ICD-10-CM | POA: Diagnosis not present

## 2011-10-01 DIAGNOSIS — I2510 Atherosclerotic heart disease of native coronary artery without angina pectoris: Secondary | ICD-10-CM | POA: Diagnosis not present

## 2011-10-01 DIAGNOSIS — N183 Chronic kidney disease, stage 3 unspecified: Secondary | ICD-10-CM

## 2011-10-01 DIAGNOSIS — E785 Hyperlipidemia, unspecified: Secondary | ICD-10-CM

## 2011-10-01 LAB — POCT INR: INR: 1.7

## 2011-10-01 LAB — BASIC METABOLIC PANEL
Calcium: 9 mg/dL (ref 8.4–10.5)
GFR: 60.97 mL/min (ref >60.00)
Sodium: 138 mEq/L (ref 135–145)

## 2011-10-01 LAB — LIPID PANEL
HDL: 30.5 mg/dL — ABNORMAL LOW (ref >39.00)
Triglycerides: 118 mg/dL (ref 0.0–149.0)

## 2011-10-01 NOTE — Assessment & Plan Note (Signed)
Check lipids today with goal LDL < 70.

## 2011-10-01 NOTE — Assessment & Plan Note (Signed)
Diffuse moderate to severe distal vessel disease on cath with ectatic coronaries with slow flow. No interventional target. He has never had chest pain. Continue ASA, statin, ACEI, beta blocker.

## 2011-10-01 NOTE — Assessment & Plan Note (Signed)
Maintaining NSR. Continue warfarin. Ablation would be next step if it recurs.

## 2011-10-01 NOTE — Assessment & Plan Note (Signed)
Systolic CHF. This may be multifactorial, due to significant distal vessel CAD as well as a history of poorly controlled diabetes. He has NYHA class II symptoms. He does not appear significantly volume overloaded on exam.  Optivol shows good thoracic impedance.  - Continue Coreg 18.75 mg bid, lisinopril 5 mg bid, spironolactone 25 mg daily, and Lasix 20 mg daily.  Continue hydralazine to 37.5 mg tid and isordil 20 mg tid.  Minimal BP room for med titration.  - Check BMET today.  - Return in 4 months.

## 2011-10-01 NOTE — Assessment & Plan Note (Signed)
Stable CKD.  Will check BMET today.

## 2011-10-01 NOTE — Patient Instructions (Signed)
Your physician recommends that you have  lab work today--BMET /Lipid profile  Your physician wants you to follow-up in: 4 months with Dr Aundra Dubin. (July 2013). You will receive a reminder letter in the mail two months in advance. If you don't receive a letter, please call our office to schedule the follow-up appointment.    Your physician recommends that you return for lab work in: 4 months when you see Dr Sallyanne Kuster

## 2011-10-01 NOTE — Progress Notes (Signed)
PCP: Dr. Asa Lente  61 yo AAM with history of CAD and mixed ischemic/nonischemic cardiomyopathy (EF 15%), NYHA Class III CHF, and diabetes who presents for followup. He initially was hospitalized in 5/11 with acute decompensated CHF after several days of shortness of breath, orthopnea, and PND. He was diuresed and left/right heart catheterization was done. Left heart cath showed large, ectatic coronaries with slow flow and moderate to severe diffuse distal vessel disease (consistent with diabetes) without good interventional options. EF was 15-20% by echo. He was put on milrinone and further diuresed for several more days. He was titrated off milrinone and discharged on an oral medication regimen. He later developed atrial flutter requiring cardioversion. He remains in NSR. Finally, patient had Medtronic ICD placed in 11/11.  Repeat echo in 10/12 showed that EF remains 25%.   Patient is stable today. He continues to have his chronic gait difficulty from diabetic neuropathy. He is feeling good in general and denies dyspnea on exertion though he is limited significantly by his foot problems.  No chest pain.  Optivol check today showed impedance trending up with fluid index below threshold.  He has not seen his PCP for over a year so diabetes is not being followed closely.   Labs (9/11): LDL 61, HDL 26, K 4.8, creatinine 1.6  Labs (10/11): K 4.3, creatinine 1.3, BNP 165  Labs (11/11): K 4.9, creatinine 1.6  Labs (12/11): K 4.8, creatinine 1.7  Labs (2/12): LDL 110, HDL 28  Labs (3/12): K 4.1, creatinine 1.7 Labs (6/12): K 4.4, creatinine 1.4, BNP 125 Labs (10/12): K 4.8, creatinine 1.4 Labs (12/12): K 4.4, creatinine 1.5, BNP 20  Allergies:  1) ! Erythromycin   Past Medical History:  1. CAD: LHC (5/11) with 50-60% mid LAD, diffuse moderate distal LAD up to 60-70%, small OM2 subtotally occluded, moderate to severe diffuse distal PLOM disease, severe diffuse distal RCA, PDA, and PLV disease. Coronaries  were ectatic with slow flow. Patient was started on Plavix given concern for thrombus formation in the ectatic coronaries. He stopped Plavix after starting coumadin with atrial flutter.  2. CHF: Systolic dysfunction, likely mixed ischemic/nonischemic. Echo (5/11) with EF 15-20%, severe global hypokinesis, mild MR, moderate diastolic dysfunction, mildly decreased RV systolic function. RHC (5/11) with mean RA 14 mmHg, PA 38/26, mean PCWP 25 mmHg, CI 1.8. Repeat echo (8/11) with EF 0000000, severe diastolic dysfunction, mild MR, RV normal size and systolic function. Per official read, cannot rule out apical clot but it does appear to be only trabeculations. TEE (9/11): EF 15% with global hypokinesis, mild to moderate MR, no LV thrombus and no LAA thrombus.  Echo (10/12): EF 25%.  3. Type II diabetes, uncontrolled  4. HTN  5. Smoker  6. severe diabetic neuropathy -with chronic gait dysfunction  7. History of left foot ulcer with poor healing  8. Atrial flutter s/p DCCV 9/11  9. Medtronic dual chamber ICD with Optivol (11/11)   Family History:  Family History of Colon CA 1st degree relative <60 9other relative)  Heart disease (grandparent)  Stroke (other relative)  dm only in secondary relatives   Social History:  Pt lives in [place]. Smoker, about 1/4 ppd.  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 aspirin 81 MG tablet Take 81 mg by mouth daily.        . B Complex Vitamins (B COMPLEX 100 PO)  Take 1 tablet by mouth daily as needed.        . carvedilol (COREG) 12.5 MG tablet Take 1.5 tablets (18.75 mg total) by mouth 2 (two) times daily.  90 tablet  6  . fish oil-omega-3 fatty acids 1000 MG capsule Take 2 capsules (2 g total) by mouth daily.  60 capsule  11  . furosemide (LASIX) 40 MG tablet TAKE ONE-HALF TABLET BY MOUTH EVERY DAY  30 tablet  6  . glipiZIDE (GLUCOTROL XL)  5 MG 24 hr tablet TAKE ONE TABLET BY MOUTH TWICE DAILY  60 tablet  5  . glucose blood test strip 1 each by Other route as needed. Use as instructed       . hydrALAZINE (APRESOLINE) 25 MG tablet Take one and one-half tablets three times a day  135 tablet  6  . isosorbide dinitrate (ISORDIL) 20 MG tablet Take 1 tablet (20 mg total) by mouth 3 (three) times daily.  90 tablet  6  . lisinopril (PRINIVIL,ZESTRIL) 5 MG tablet TAKE ONE TABLET BY MOUTH TWICE DAILY  60 tablet  6  . Multiple Vitamin (MULTIVITAMIN) tablet Take 1 tablet by mouth daily.        . rosuvastatin (CRESTOR) 40 MG tablet Take 40 mg by mouth daily.        Marland Kitchen spironolactone (ALDACTONE) 25 MG tablet TAKE ONE TABLET BY MOUTH EVERY DAY  30 tablet  6  . warfarin (COUMADIN) 5 MG tablet TAKE AS DIRECTED BY COUMADIN CLINIC  50 tablet  3    BP 90/58  Pulse 72  Ht 6\' 4"  (1.93 m)  Wt 257 lb (116.574 kg)  BMI 31.28 kg/m2 General: Well developed, well nourished, in no acute distress.  Neck: Neck supple, no JVD. No masses, thyromegaly or abnormal cervical nodes.  Lungs: Clear bilaterally to auscultation and percussion.  Heart: Non-displaced PMI, chest non-tender; regular rate and rhythm, S1, S2 without murmurs, rubs or gallops. Carotid upstroke normal, no bruit. I am unable to palpate his pedal pulses but feet are warm. No edema, no varicosities.  Abdomen: Bowel sounds positive; abdomen soft and non-tender without masses, organomegaly, or hernias noted. No hepatosplenomegaly.  Extremities: No clubbing or cyanosis.  Neurologic: Alert and oriented x 3.  Psych: Normal affect.

## 2011-10-29 ENCOUNTER — Ambulatory Visit (INDEPENDENT_AMBULATORY_CARE_PROVIDER_SITE_OTHER): Payer: Medicare Other | Admitting: *Deleted

## 2011-10-29 DIAGNOSIS — I4892 Unspecified atrial flutter: Secondary | ICD-10-CM | POA: Diagnosis not present

## 2011-10-29 LAB — POCT INR: INR: 3

## 2011-11-19 ENCOUNTER — Ambulatory Visit (INDEPENDENT_AMBULATORY_CARE_PROVIDER_SITE_OTHER): Payer: Medicare Other | Admitting: *Deleted

## 2011-11-19 ENCOUNTER — Encounter: Payer: Self-pay | Admitting: Internal Medicine

## 2011-11-19 DIAGNOSIS — I5022 Chronic systolic (congestive) heart failure: Secondary | ICD-10-CM | POA: Diagnosis not present

## 2011-11-19 DIAGNOSIS — I428 Other cardiomyopathies: Secondary | ICD-10-CM | POA: Diagnosis not present

## 2011-11-19 DIAGNOSIS — I4892 Unspecified atrial flutter: Secondary | ICD-10-CM

## 2011-11-19 LAB — ICD DEVICE OBSERVATION
AL THRESHOLD: 0.625 V
BATTERY VOLTAGE: 3.1389 V
CHARGE TIME: 9.559 s
DEV-0020ICD: NEGATIVE
PACEART VT: 0
RV LEAD AMPLITUDE: 9 mv
RV LEAD THRESHOLD: 0.75 V
TOT-0002: 0
TOT-0006: 20111108000000
TZAT-0001ATACH: 3
TZAT-0001SLOWVT: 1
TZAT-0002FASTVT: NEGATIVE
TZAT-0002SLOWVT: NEGATIVE
TZAT-0002SLOWVT: NEGATIVE
TZAT-0012ATACH: 150 ms
TZAT-0012SLOWVT: 170 ms
TZAT-0018FASTVT: NEGATIVE
TZAT-0018SLOWVT: NEGATIVE
TZAT-0018SLOWVT: NEGATIVE
TZAT-0019ATACH: 6 V
TZAT-0019FASTVT: 8 V
TZAT-0019SLOWVT: 8 V
TZAT-0020ATACH: 1.5 ms
TZAT-0020ATACH: 1.5 ms
TZAT-0020ATACH: 1.5 ms
TZAT-0020FASTVT: 1.5 ms
TZAT-0020SLOWVT: 1.5 ms
TZON-0003VSLOWVT: 340 ms
TZST-0001ATACH: 6
TZST-0001FASTVT: 5
TZST-0001FASTVT: 6
TZST-0001SLOWVT: 3
TZST-0002ATACH: NEGATIVE
TZST-0002FASTVT: NEGATIVE
TZST-0002FASTVT: NEGATIVE
TZST-0002FASTVT: NEGATIVE
TZST-0002SLOWVT: NEGATIVE
TZST-0002SLOWVT: NEGATIVE
VF: 0

## 2011-11-19 LAB — POCT INR: INR: 1.9

## 2011-11-19 NOTE — Patient Instructions (Signed)
Follow coumadin clinic instructions for dosing of medication

## 2011-11-19 NOTE — Progress Notes (Signed)
defib check in clinic  

## 2011-12-17 ENCOUNTER — Ambulatory Visit (INDEPENDENT_AMBULATORY_CARE_PROVIDER_SITE_OTHER): Payer: Medicare Other | Admitting: *Deleted

## 2011-12-17 DIAGNOSIS — I4892 Unspecified atrial flutter: Secondary | ICD-10-CM | POA: Diagnosis not present

## 2011-12-17 LAB — POCT INR: INR: 5.2

## 2011-12-30 ENCOUNTER — Other Ambulatory Visit: Payer: Self-pay | Admitting: Internal Medicine

## 2011-12-31 ENCOUNTER — Ambulatory Visit (INDEPENDENT_AMBULATORY_CARE_PROVIDER_SITE_OTHER): Payer: Medicare Other | Admitting: *Deleted

## 2011-12-31 DIAGNOSIS — I4892 Unspecified atrial flutter: Secondary | ICD-10-CM | POA: Diagnosis not present

## 2011-12-31 LAB — POCT INR: INR: 3.3

## 2012-01-19 ENCOUNTER — Telehealth: Payer: Self-pay | Admitting: Cardiology

## 2012-01-19 NOTE — Telephone Encounter (Signed)
New msg Pt wants to talk to you about meds for blood sugar. He wants to get refilled until he gets new pcp at [place]. Please call

## 2012-01-19 NOTE — Telephone Encounter (Signed)
Spoke with pt. Pt will contact Dr Katheren Puller office about refilling diabetes meds until pt can get to [place] in Sept to get established with PCP there.

## 2012-01-26 ENCOUNTER — Ambulatory Visit (INDEPENDENT_AMBULATORY_CARE_PROVIDER_SITE_OTHER): Payer: Medicare Other

## 2012-01-26 DIAGNOSIS — I4892 Unspecified atrial flutter: Secondary | ICD-10-CM | POA: Diagnosis not present

## 2012-02-05 ENCOUNTER — Other Ambulatory Visit: Payer: Self-pay | Admitting: Cardiology

## 2012-03-01 ENCOUNTER — Ambulatory Visit (INDEPENDENT_AMBULATORY_CARE_PROVIDER_SITE_OTHER): Payer: Medicare Other | Admitting: *Deleted

## 2012-03-01 ENCOUNTER — Encounter: Payer: Self-pay | Admitting: Internal Medicine

## 2012-03-01 ENCOUNTER — Ambulatory Visit (INDEPENDENT_AMBULATORY_CARE_PROVIDER_SITE_OTHER): Payer: Medicare Other | Admitting: Cardiology

## 2012-03-01 ENCOUNTER — Encounter: Payer: Self-pay | Admitting: Cardiology

## 2012-03-01 VITALS — BP 140/76 | HR 80 | Ht 76.0 in | Wt 253.0 lb

## 2012-03-01 DIAGNOSIS — I2510 Atherosclerotic heart disease of native coronary artery without angina pectoris: Secondary | ICD-10-CM

## 2012-03-01 DIAGNOSIS — E785 Hyperlipidemia, unspecified: Secondary | ICD-10-CM

## 2012-03-01 DIAGNOSIS — I10 Essential (primary) hypertension: Secondary | ICD-10-CM | POA: Diagnosis not present

## 2012-03-01 DIAGNOSIS — R0989 Other specified symptoms and signs involving the circulatory and respiratory systems: Secondary | ICD-10-CM

## 2012-03-01 DIAGNOSIS — E119 Type 2 diabetes mellitus without complications: Secondary | ICD-10-CM

## 2012-03-01 DIAGNOSIS — R0602 Shortness of breath: Secondary | ICD-10-CM

## 2012-03-01 DIAGNOSIS — I5022 Chronic systolic (congestive) heart failure: Secondary | ICD-10-CM

## 2012-03-01 DIAGNOSIS — I4892 Unspecified atrial flutter: Secondary | ICD-10-CM

## 2012-03-01 DIAGNOSIS — N183 Chronic kidney disease, stage 3 unspecified: Secondary | ICD-10-CM

## 2012-03-01 DIAGNOSIS — I428 Other cardiomyopathies: Secondary | ICD-10-CM

## 2012-03-01 LAB — ICD DEVICE OBSERVATION
AL THRESHOLD: 0.625 V
ATRIAL PACING ICD: 33.84 pct
BAMS-0001: 155 {beats}/min
BATTERY VOLTAGE: 3.1551 V
DEV-0020ICD: NEGATIVE
FVT: 0
RV LEAD THRESHOLD: 0.875 V
TZAT-0001ATACH: 1
TZAT-0001ATACH: 2
TZAT-0001ATACH: 3
TZAT-0002ATACH: NEGATIVE
TZAT-0002SLOWVT: NEGATIVE
TZAT-0012ATACH: 150 ms
TZAT-0012ATACH: 150 ms
TZAT-0012ATACH: 150 ms
TZAT-0012FASTVT: 170 ms
TZAT-0018ATACH: NEGATIVE
TZAT-0018ATACH: NEGATIVE
TZAT-0018SLOWVT: NEGATIVE
TZAT-0019ATACH: 6 V
TZAT-0019FASTVT: 8 V
TZAT-0019SLOWVT: 8 V
TZAT-0019SLOWVT: 8 V
TZAT-0020ATACH: 1.5 ms
TZAT-0020FASTVT: 1.5 ms
TZAT-0020SLOWVT: 1.5 ms
TZAT-0020SLOWVT: 1.5 ms
TZON-0003ATACH: 390 ms
TZON-0003SLOWVT: 400 ms
TZON-0003VSLOWVT: 340 ms
TZON-0004VSLOWVT: 32
TZST-0001ATACH: 4
TZST-0001ATACH: 6
TZST-0001FASTVT: 2
TZST-0001FASTVT: 3
TZST-0001FASTVT: 5
TZST-0001SLOWVT: 3
TZST-0001SLOWVT: 4
TZST-0002ATACH: NEGATIVE
TZST-0002ATACH: NEGATIVE
TZST-0002FASTVT: NEGATIVE
TZST-0002FASTVT: NEGATIVE
TZST-0002FASTVT: NEGATIVE
TZST-0002SLOWVT: NEGATIVE
TZST-0002SLOWVT: NEGATIVE
VENTRICULAR PACING ICD: 0.05 pct
VF: 0

## 2012-03-01 LAB — BASIC METABOLIC PANEL
CO2: 24 mEq/L (ref 19–32)
Chloride: 105 mEq/L (ref 96–112)
Potassium: 4.2 mEq/L (ref 3.5–5.1)
Sodium: 138 mEq/L (ref 135–145)

## 2012-03-01 MED ORDER — LISINOPRIL 2.5 MG PO TABS: 2.5000 mg | ORAL_TABLET | Freq: Two times a day (BID) | ORAL | Status: DC

## 2012-03-01 MED ORDER — GLIPIZIDE ER 5 MG PO TB24: 5.0000 mg | ORAL_TABLET | Freq: Every day | ORAL | Status: DC

## 2012-03-01 MED ORDER — HYDRALAZINE HCL 50 MG PO TABS: 50.0000 mg | ORAL_TABLET | Freq: Three times a day (TID) | ORAL | Status: DC

## 2012-03-01 MED ORDER — ISOSORBIDE DINITRATE 30 MG PO TABS: 30.0000 mg | ORAL_TABLET | Freq: Three times a day (TID) | ORAL | Status: DC

## 2012-03-01 NOTE — Assessment & Plan Note (Signed)
Goal LDL < 70.  Will be getting lipids checked soon at [place].

## 2012-03-01 NOTE — Assessment & Plan Note (Signed)
Diffuse moderate to severe distal vessel disease on cath with ectatic coronaries with slow flow. No interventional target. He has never had chest pain. Continue ASA, statin, ACEI, beta blocker.

## 2012-03-01 NOTE — Assessment & Plan Note (Signed)
He is going to have this followed at the [place] and has an appointment there next month.  In the meantime, I will refill his glipizide.

## 2012-03-01 NOTE — Patient Instructions (Addendum)
Hydralazine 50mg  three times per day.  Lisinopril 2.5mg  twice daily.  Isosorbide 30mg  three times per day.  LABS TODAY:  BMET and BNP.  Your physician recommends that you schedule a follow-up appointment in:  3 months with Dr. Aundra Dubin.

## 2012-03-01 NOTE — Progress Notes (Signed)
icd check in clinic  

## 2012-03-01 NOTE — Progress Notes (Signed)
Patient ID: Anthony Rubio., male   DOB: 02/25/1951, 61 y.o.   MRN: [id number] PCP: Dr. Asa Lente  61 yo AAM with history of CAD and mixed ischemic/nonischemic cardiomyopathy, NYHA Class III CHF, and diabetes who presents for followup. He initially was hospitalized in 5/11 with acute decompensated CHF after several days of shortness of breath, orthopnea, and PND. He was diuresed and left/right heart catheterization was done. Left heart cath showed large, ectatic coronaries with slow flow and moderate to severe diffuse distal vessel disease (consistent with diabetes) without good interventional options. EF was 15-20% by echo. He was put on milrinone and further diuresed for several more days. He was titrated off milrinone and discharged on an oral medication regimen. He later developed atrial flutter requiring cardioversion. He remains in NSR. Finally, patient had Medtronic ICD placed in 11/11.  Repeat echo in 10/12 showed that EF remains 25%.   Patient is stable today. He continues to have his chronic gait difficulty from diabetic neuropathy. He is feeling good in general and denies dyspnea on exertion though he is limited significantly by his foot problems.  No chest pain.  Optivol today was stable without evidence for volume overload.  He still has not re-established care with a PCP and has run out of his glipizide.  Fasting glucose ranges from 120s-180s when he checks it at home.    Mr. Monley cut lisinopril back from 5 mg bid to 2.5 mg bid because it was making him profoundly tired.  He feels better on the lower dose.    Labs (9/11): LDL 61, HDL 26, K 4.8, creatinine 1.6  Labs (10/11): K 4.3, creatinine 1.3, BNP 165  Labs (11/11): K 4.9, creatinine 1.6  Labs (12/11): K 4.8, creatinine 1.7  Labs (2/12): LDL 110, HDL 28  Labs (3/12): K 4.1, creatinine 1.7 Labs (6/12): K 4.4, creatinine 1.4, BNP 125 Labs (10/12): K 4.8, creatinine 1.4 Labs (12/12): K 4.4, creatinine 1.5, BNP 20 Labs (3/13): K 5,  creatinine 1.5, LDL 43, HDL 31  Allergies:  1) ! Erythromycin   Past Medical History:  1. CAD: LHC (5/11) with 50-60% mid LAD, diffuse moderate distal LAD up to 60-70%, small OM2 subtotally occluded, moderate to severe diffuse distal PLOM disease, severe diffuse distal RCA, PDA, and PLV disease. Coronaries were ectatic with slow flow. Patient was started on Plavix given concern for thrombus formation in the ectatic coronaries. He stopped Plavix after starting coumadin with atrial flutter.  2. CHF: Systolic dysfunction, likely mixed ischemic/nonischemic. Echo (5/11) with EF 15-20%, severe global hypokinesis, mild MR, moderate diastolic dysfunction, mildly decreased RV systolic function. RHC (5/11) with mean RA 14 mmHg, PA 38/26, mean PCWP 25 mmHg, CI 1.8. Repeat echo (8/11) with EF 0000000, severe diastolic dysfunction, mild MR, RV normal size and systolic function. Per official read, cannot rule out apical clot but it does appear to be only trabeculations. TEE (9/11): EF 15% with global hypokinesis, mild to moderate MR, no LV thrombus and no LAA thrombus.  Echo (10/12): EF 25%.  3. Type II diabetes, uncontrolled  4. HTN  5. Smoker  6. severe diabetic neuropathy -with chronic gait dysfunction  7. History of left foot ulcer with poor healing  8. Atrial flutter s/p DCCV 9/11  9. Medtronic dual chamber ICD with Optivol (11/11)   Family History:  Family History of Colon CA 1st degree relative <60 9other relative)  Heart disease (grandparent)  Stroke (other relative)  dm only in secondary relatives   Social  History:  Pt lives in [place]. Smoker, about 1/4 ppd.  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    Current Outpatient Prescriptions  Medication Sig Dispense Refill  . aspirin 81 MG tablet Take 81 mg by mouth daily.        . B Complex Vitamins (B COMPLEX 100 PO) Take 1 tablet  by mouth daily as needed.        . carvedilol (COREG) 12.5 MG tablet Take 1.5 tablets (18.75 mg total) by mouth 2 (two) times daily.  90 tablet  6  . fish oil-omega-3 fatty acids 1000 MG capsule Take 2 capsules (2 g total) by mouth daily.  60 capsule  11  . furosemide (LASIX) 40 MG tablet TAKE ONE-HALF TABLET BY MOUTH EVERY DAY  30 tablet  6  . glipiZIDE (GLUCOTROL XL) 5 MG 24 hr tablet Take 1 tablet (5 mg total) by mouth daily.  30 tablet  3  . glucose blood test strip 1 each by Other route as needed. Use as instructed       . Multiple Vitamin (MULTIVITAMIN) tablet Take 1 tablet by mouth daily.        . rosuvastatin (CRESTOR) 40 MG tablet Take 40 mg by mouth daily.        Marland Kitchen spironolactone (ALDACTONE) 25 MG tablet TAKE ONE TABLET BY MOUTH EVERY DAY  30 tablet  6  . warfarin (COUMADIN) 5 MG tablet TAKE AS DIRECTED BY COUMADIN CLINIC  50 tablet  3  . DISCONTD: glipiZIDE (GLUCOTROL XL) 5 MG 24 hr tablet TAKE ONE TABLET BY MOUTH TWICE DAILY  60 tablet  5  . hydrALAZINE (APRESOLINE) 50 MG tablet Take 1 tablet (50 mg total) by mouth 3 (three) times daily.  270 tablet  3  . isosorbide dinitrate (ISORDIL) 30 MG tablet Take 1 tablet (30 mg total) by mouth 3 (three) times daily.  90 tablet  11  . lisinopril (PRINIVIL,ZESTRIL) 2.5 MG tablet Take 1 tablet (2.5 mg total) by mouth 2 (two) times daily.  60 tablet  11    BP 140/76  Pulse 80  Ht 6\' 4"  (1.93 m)  Wt 253 lb (114.76 kg)  BMI 30.80 kg/m2 General: Well developed, well nourished, in no acute distress.  Neck: Neck supple, no JVD. No masses, thyromegaly or abnormal cervical nodes.  Lungs: Clear bilaterally to auscultation and percussion.  Heart: Non-displaced PMI, chest non-tender; regular rate and rhythm, S1, S2 without murmurs, rubs or gallops. Carotid upstroke normal, no bruit. I am unable to palpate his pedal pulses but feet are warm. No edema, no varicosities.  Abdomen: Bowel sounds positive; abdomen soft and non-tender without masses,  organomegaly, or hernias noted. No hepatosplenomegaly.  Extremities: No clubbing or cyanosis.  Neurologic: Alert and oriented x 3.  Psych: Normal affect.

## 2012-03-01 NOTE — Assessment & Plan Note (Signed)
Systolic CHF. This may be multifactorial, due to significant distal vessel CAD as well as a history of poorly controlled diabetes. He has NYHA class II symptoms. He does not appear significantly volume overloaded on exam.  Optivol shows good thoracic impedance.  - Continue Coreg 18.75 mg bid, spironolactone 25 mg daily, and Lasix 20 mg daily.   - OK to take lower dose of lisinopril, 2.5 mg bid.  - Given plenty of BP room, will increase hydralazine to 50 mg tid and isordil to 30 mg tid.  - Getting BMET/BNP today.

## 2012-03-01 NOTE — Assessment & Plan Note (Signed)
Maintaining NSR. Continue warfarin. Ablation would be next step if it recurs.

## 2012-03-01 NOTE — Assessment & Plan Note (Signed)
Renal fxn has been stable.  Likely component of diabetic nephropathy.  BMET today.  Needs to get PCP followup for his diabetes.

## 2012-03-31 ENCOUNTER — Ambulatory Visit (INDEPENDENT_AMBULATORY_CARE_PROVIDER_SITE_OTHER): Payer: Medicare Other | Admitting: *Deleted

## 2012-03-31 DIAGNOSIS — I4892 Unspecified atrial flutter: Secondary | ICD-10-CM

## 2012-03-31 NOTE — Addendum Note (Signed)
Addended by: Fernande Boyden on: 03/31/2012 09:58 AM   Modules accepted: Level of Service

## 2012-04-28 ENCOUNTER — Ambulatory Visit (INDEPENDENT_AMBULATORY_CARE_PROVIDER_SITE_OTHER): Payer: Medicare Other | Admitting: *Deleted

## 2012-04-28 DIAGNOSIS — I4892 Unspecified atrial flutter: Secondary | ICD-10-CM

## 2012-04-28 LAB — POCT INR: INR: 2.8

## 2012-06-08 ENCOUNTER — Other Ambulatory Visit: Payer: Self-pay | Admitting: Cardiology

## 2012-06-09 ENCOUNTER — Ambulatory Visit (INDEPENDENT_AMBULATORY_CARE_PROVIDER_SITE_OTHER): Payer: Medicare Other | Admitting: Cardiology

## 2012-06-09 ENCOUNTER — Encounter: Payer: Self-pay | Admitting: Cardiology

## 2012-06-09 ENCOUNTER — Ambulatory Visit (INDEPENDENT_AMBULATORY_CARE_PROVIDER_SITE_OTHER): Payer: Medicare Other

## 2012-06-09 VITALS — BP 104/60 | HR 64 | Ht 76.0 in | Wt 260.0 lb

## 2012-06-09 DIAGNOSIS — N183 Chronic kidney disease, stage 3 unspecified: Secondary | ICD-10-CM

## 2012-06-09 DIAGNOSIS — I5022 Chronic systolic (congestive) heart failure: Secondary | ICD-10-CM

## 2012-06-09 DIAGNOSIS — I2510 Atherosclerotic heart disease of native coronary artery without angina pectoris: Secondary | ICD-10-CM

## 2012-06-09 DIAGNOSIS — I4892 Unspecified atrial flutter: Secondary | ICD-10-CM | POA: Diagnosis not present

## 2012-06-09 DIAGNOSIS — R0602 Shortness of breath: Secondary | ICD-10-CM

## 2012-06-09 DIAGNOSIS — E785 Hyperlipidemia, unspecified: Secondary | ICD-10-CM

## 2012-06-09 LAB — LIPID PANEL
Cholesterol: 147 mg/dL (ref 0–200)
HDL: 28.5 mg/dL — ABNORMAL LOW (ref >39.00)
LDL Cholesterol: 91 mg/dL (ref 0–99)
Total CHOL/HDL Ratio: 5
Triglycerides: 140 mg/dL (ref 0.0–149.0)

## 2012-06-09 LAB — BRAIN NATRIURETIC PEPTIDE: Pro B Natriuretic peptide (BNP): 138 pg/mL — ABNORMAL HIGH (ref 0.0–100.0)

## 2012-06-09 LAB — BASIC METABOLIC PANEL
BUN: 30 mg/dL — ABNORMAL HIGH (ref 6–23)
Calcium: 9 mg/dL (ref 8.4–10.5)
Creatinine, Ser: 1.5 mg/dL (ref 0.4–1.5)

## 2012-06-09 MED ORDER — CARVEDILOL 12.5 MG PO TABS: 18.7500 mg | ORAL_TABLET | Freq: Two times a day (BID) | ORAL | Status: DC

## 2012-06-09 MED ORDER — LOSARTAN POTASSIUM 25 MG PO TABS: ORAL_TABLET | ORAL | Status: DC

## 2012-06-09 MED ORDER — GLIPIZIDE ER 5 MG PO TB24: 5.0000 mg | ORAL_TABLET | Freq: Every day | ORAL | Status: DC

## 2012-06-09 NOTE — Progress Notes (Signed)
Patient ID: [name], male   DOB: 10/23/1950, 61 y.o.   MRN: [id number] PCP: Dr. Asa Lente  61 yo AAM with history of CAD and mixed ischemic/nonischemic cardiomyopathy, NYHA Class III CHF, and diabetes who presents for followup. He initially was hospitalized in 5/11 with acute decompensated CHF after several days of shortness of breath, orthopnea, and PND. He was diuresed and left/right heart catheterization was done. Left heart cath showed large, ectatic coronaries with slow flow and moderate to severe diffuse distal vessel disease (consistent with diabetes) without good interventional options. EF was 15-20% by echo. He was put on milrinone and further diuresed for several more days. He was titrated off milrinone and discharged on an oral medication regimen. He later developed atrial flutter requiring cardioversion. He remains in NSR. Finally, patient had Medtronic ICD placed in 11/11.  Repeat echo in 10/12 showed that EF remains 25%.   Patient is stable today. He continues to have his chronic gait difficulty from diabetic neuropathy. He is feeling good in general and denies dyspnea on exertion though he is limited significantly by his foot problems.  No chest pain.  Weight is stable compared to prior appointment.  He is doing more housework now including vacuuming.  He continues to feel tired and sleepy right after taking lisinopril.  He is only taking 1/4 of a 5 mg lisinopril pill now.   ECG: NSR, old anterior MI  Labs (9/11): LDL 61, HDL 26, K 4.8, creatinine 1.6  Labs (10/11): K 4.3, creatinine 1.3, BNP 165  Labs (11/11): K 4.9, creatinine 1.6  Labs (12/11): K 4.8, creatinine 1.7  Labs (2/12): LDL 110, HDL 28  Labs (3/12): K 4.1, creatinine 1.7 Labs (6/12): K 4.4, creatinine 1.4, BNP 125 Labs (10/12): K 4.8, creatinine 1.4 Labs (12/12): K 4.4, creatinine 1.5, BNP 20 Labs (3/13): K 5, creatinine 1.5, LDL 43, HDL 31 Labs (8/13): K 4.2, creatinine 1.3, BNP 158  Allergies:  1) !  Erythromycin   Past Medical History:  1. CAD: LHC (5/11) with 50-60% mid LAD, diffuse moderate distal LAD up to 60-70%, small OM2 subtotally occluded, moderate to severe diffuse distal PLOM disease, severe diffuse distal RCA, PDA, and PLV disease. Coronaries were ectatic with slow flow. Patient was started on Plavix given concern for thrombus formation in the ectatic coronaries. He stopped Plavix after starting coumadin with atrial flutter.  2. CHF: Systolic dysfunction, likely mixed ischemic/nonischemic. Echo (5/11) with EF 15-20%, severe global hypokinesis, mild MR, moderate diastolic dysfunction, mildly decreased RV systolic function. RHC (5/11) with mean RA 14 mmHg, PA 38/26, mean PCWP 25 mmHg, CI 1.8. Repeat echo (8/11) with EF 0000000, severe diastolic dysfunction, mild MR, RV normal size and systolic function. Per official read, cannot rule out apical clot but it does appear to be only trabeculations. TEE (9/11): EF 15% with global hypokinesis, mild to moderate MR, no LV thrombus and no LAA thrombus.  Echo (10/12): EF 25%.  3. Type II diabetes, uncontrolled  4. HTN  5. Smoker  6. severe diabetic neuropathy -with chronic gait dysfunction  7. History of left foot ulcer with poor healing  8. Atrial flutter s/p DCCV 9/11  9. Medtronic dual chamber ICD with Optivol (11/11)   Family History:  Family History of Colon CA 1st degree relative <60 9other relative)  Heart disease (grandparent)  Stroke (other relative)  dm only in secondary relatives   Social History:  Pt lives in [place]. Smoker, about 1/4 ppd.  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    Current Outpatient Prescriptions  Medication Sig Dispense Refill  . aspirin 81 MG tablet Take 81 mg by mouth daily.        . B Complex Vitamins (B COMPLEX 100 PO) Take 1 tablet by mouth daily as needed.        . carvedilol  (COREG) 12.5 MG tablet Take 18.75 mg by mouth 2 (two) times daily.      . fish oil-omega-3 fatty acids 1000 MG capsule Take 2 capsules (2 g total) by mouth daily.  60 capsule  11  . furosemide (LASIX) 40 MG tablet TAKE ONE-HALF TABLET BY MOUTH EVERY DAY  30 tablet  6  . glipiZIDE (GLUCOTROL XL) 5 MG 24 hr tablet Take 1 tablet (5 mg total) by mouth daily.  30 tablet  3  . glucose blood test strip 1 each by Other route as needed. Use as instructed       . hydrALAZINE (APRESOLINE) 50 MG tablet Take 1 tablet (50 mg total) by mouth 3 (three) times daily.  270 tablet  3  . isosorbide dinitrate (ISORDIL) 30 MG tablet Take 1 tablet (30 mg total) by mouth 3 (three) times daily.  90 tablet  11  . lisinopril (PRINIVIL,ZESTRIL) 2.5 MG tablet       . Multiple Vitamin (MULTIVITAMIN) tablet Take 1 tablet by mouth daily.        . rosuvastatin (CRESTOR) 40 MG tablet Take 40 mg by mouth daily.        Marland Kitchen spironolactone (ALDACTONE) 25 MG tablet TAKE ONE TABLET BY MOUTH EVERY DAY  30 tablet  6  . warfarin (COUMADIN) 5 MG tablet TAKE AS DIRECTED BY COUMADIN CLINIC  50 tablet  3  . [DISCONTINUED] carvedilol (COREG) 12.5 MG tablet Take 1.5 tablets (18.75 mg total) by mouth 2 (two) times daily.  90 tablet  6    Ht 6\' 4"  (1.93 m)  Wt 260 lb (117.935 kg)  BMI 31.65 kg/m2 General: Well developed, well nourished, in no acute distress.  Neck: Neck supple, no JVD. No masses, thyromegaly or abnormal cervical nodes.  Lungs: Clear bilaterally to auscultation and percussion.  Heart: Non-displaced PMI, chest non-tender; regular rate and rhythm, S1, S2 without murmurs, rubs or gallops. Carotid upstroke normal, no bruit. I am unable to palpate his pedal pulses but feet are warm. No edema, no varicosities.  Abdomen: Bowel sounds positive; abdomen soft and non-tender without masses, organomegaly, or hernias noted. No hepatosplenomegaly.  Extremities: No clubbing or cyanosis.  Neurologic: Alert and oriented x 3.  Psych: Normal  affect.  Assessment/Plan:  ATRIAL FLUTTER  Maintaining NSR. Continue warfarin. Ablation would be next step if it recurs.  CHRONIC KIDNEY DISEASE STAGE III (MODERATE) Renal fxn has been stable. Likely component of diabetic nephropathy. BMET today. Needs to get PCP followup for his diabetes.  Chronic systolic heart failure  Systolic CHF. This may be multifactorial, due to significant distal vessel CAD as well as a history of poorly controlled diabetes. He has NYHA class II symptoms. He does not appear significantly volume overloaded on exam and weight is stable. - Continue Coreg 18.75 mg bid, spironolactone 25 mg daily, hydralazine/isordil, and Lasix 20 mg daily.  - He is having a hard time with lisinopril.  I will have him stop this medication and start losartan 12.5 mg daily.  - Check BMET/BNP  today.  CORONARY ARTERY DISEASE Diffuse moderate to severe distal vessel disease on cath with ectatic coronaries with slow flow. No interventional target. He has never had chest pain. Continue ASA, statin, ACEI, beta blocker. HYPERLIPIDEMIA Continue high dose statin, will check lipids today.  DIABETES MELLITUS, TYPE II  Needs PCP followup.  I will refill glipizide today.   [name] 06/09/2012

## 2012-06-09 NOTE — Patient Instructions (Addendum)
Stop aspirin.  Stop lisinopril.  Start losartan 12.5mg  daily. This will be 1/2 of a 25mg  tablet daily.  Your physician recommends that you have  a FASTING lipid profile  /BMET/BNP today.  Your physician wants you to follow-up in: 4 months with Dr Aundra Dubin. (April 2014). You will receive a reminder letter in the mail two months in advance. If you don't receive a letter, please call our office to schedule the follow-up appointment.   Your physician recommends that you schedule a follow-up appointment with Dr Asa Lente ASAP about your diabetes management.

## 2012-06-10 ENCOUNTER — Telehealth: Payer: Self-pay | Admitting: Internal Medicine

## 2012-06-10 NOTE — Telephone Encounter (Signed)
I spoke with the patient and apologized for any inconvienence he was having getting in to see you.  He has a apt scheduled for December 20th now!   Thanks!

## 2012-06-10 NOTE — Telephone Encounter (Signed)
thanks

## 2012-06-24 ENCOUNTER — Ambulatory Visit (INDEPENDENT_AMBULATORY_CARE_PROVIDER_SITE_OTHER): Payer: Medicare Other | Admitting: Internal Medicine

## 2012-06-24 ENCOUNTER — Other Ambulatory Visit (INDEPENDENT_AMBULATORY_CARE_PROVIDER_SITE_OTHER): Payer: Medicare Other

## 2012-06-24 ENCOUNTER — Encounter: Payer: Self-pay | Admitting: Internal Medicine

## 2012-06-24 VITALS — BP 110/62 | HR 55 | Temp 98.7°F | Ht 76.0 in | Wt 257.8 lb

## 2012-06-24 DIAGNOSIS — R5383 Other fatigue: Secondary | ICD-10-CM | POA: Diagnosis not present

## 2012-06-24 DIAGNOSIS — R5381 Other malaise: Secondary | ICD-10-CM

## 2012-06-24 DIAGNOSIS — E119 Type 2 diabetes mellitus without complications: Secondary | ICD-10-CM

## 2012-06-24 DIAGNOSIS — I10 Essential (primary) hypertension: Secondary | ICD-10-CM | POA: Diagnosis not present

## 2012-06-24 DIAGNOSIS — Z23 Encounter for immunization: Secondary | ICD-10-CM | POA: Diagnosis not present

## 2012-06-24 DIAGNOSIS — I5022 Chronic systolic (congestive) heart failure: Secondary | ICD-10-CM

## 2012-06-24 DIAGNOSIS — R269 Unspecified abnormalities of gait and mobility: Secondary | ICD-10-CM

## 2012-06-24 LAB — CBC WITH DIFFERENTIAL/PLATELET
Basophils Relative: 0.4 % (ref 0.0–3.0)
Eosinophils Absolute: 0 10*3/uL (ref 0.0–0.7)
HCT: 41.7 % (ref 39.0–52.0)
Hemoglobin: 14.1 g/dL (ref 13.0–17.0)
Lymphocytes Relative: 20.6 % (ref 12.0–46.0)
Lymphs Abs: 2.1 10*3/uL (ref 0.7–4.0)
MCHC: 33.8 g/dL (ref 30.0–36.0)
Monocytes Relative: 6.3 % (ref 3.0–12.0)
Neutro Abs: 7.6 10*3/uL (ref 1.4–7.7)
RBC: 4.98 Mil/uL (ref 4.22–5.81)
RDW: 14.4 % (ref 11.5–14.6)

## 2012-06-24 LAB — HEPATIC FUNCTION PANEL
Alkaline Phosphatase: 48 U/L (ref 39–117)
Bilirubin, Direct: 0.1 mg/dL (ref 0.0–0.3)
Total Protein: 6.7 g/dL (ref 6.0–8.3)

## 2012-06-24 LAB — HEMOGLOBIN A1C: Hgb A1c MFr Bld: 9.1 % — ABNORMAL HIGH (ref 4.6–6.5)

## 2012-06-24 MED ORDER — SITAGLIPTIN PHOSPHATE 50 MG PO TABS: 50.0000 mg | ORAL_TABLET | Freq: Every day | ORAL | Status: DC

## 2012-06-24 NOTE — Assessment & Plan Note (Signed)
s/p eval Nsurg 12/2009 ([facility]) - felt due to severe diabetic neuropathy -  no cervical myelopathy despite cevical spondyolsis on MRI

## 2012-06-24 NOTE — Patient Instructions (Signed)
It was good to see you today. We have reviewed your prior records including labs and tests today Medications reviewed, no changes at this time. Test(s) ordered today. Your results will be released to [facility] (or called to you) after review, usually within 72hours after test completion. If any changes need to be made, you will be notified at that same time. Please schedule followup in 3-4 months for diabetes mellitus check, call sooner if problems.

## 2012-06-24 NOTE — Assessment & Plan Note (Signed)
euvolemic today - follows with cards regularly The current medical regimen is effective;  continue present plan and medications.

## 2012-06-24 NOTE — Progress Notes (Signed)
Subjective:    Patient ID: Anthony Rubio., male    DOB: May 15, 1951, 61 y.o.   MRN: [id number]  HPI  here for followup - reviewed chronic medical issues   DM2 - check sugars 3-4x/week - denies symptoms of low sugar - no sweating - denies PU/PD - reports compliance with ongoing medical treatment and no changes in medication dose or frequency. denies adverse side effects related to current therapy.    dyslipidemia - reports compliance with ongoing medical treatment and no changes in medication dose or frequency. denies adverse side effects related to current therapy. no GI or muscle complaints   CAD/CHF - interval hx reviewed- reports compliance with ongoing medical treatment and no changes in medication dose or frequency. denies adverse side effects related to current therapy. no leg swelling or shortness of breath   Gait disorder - related to severe bilateral DM neuropathy - ongoing since 2007 - relates change to wearing lift off cast on left foot for wound healing - chronically poor dorsiflex L>R foot - no falls, no use of walk asst aide - s/p Nsurg eval for same 12/2009 - MRI c-spine with spondolytic changes but no severe stenosis -  Past Medical History  Diagnosis Date  . CAD (coronary artery disease)     LHC (5/11) with 50-60% mid LAD, diffuse moderate distal LAD up to 60-70%, small OM2 subtotally  occluded, moderate to severe diffuse distal PLOM disease, severe diffuse distal RCA, PDA, and PLV  disease.  Coronaries were ectatic with slow flow.  Patient was started on Plavix given concern for thrombus  formation in the ectatic coronaries.  He stopped Plavix after starting coumadin with atrial flut  . CHF (congestive heart failure)     Systolic dysfunction, likely mixed ischemic/nonischemic.  Echo (5/11) with EF 15-20%, severe  global hypokinesis, mild MR, moderate diastolic dysfunction, mildly decreased RV systolic function.  RHC  (5/11) with mean RA 14 mmHg, PA 38/26, mean PCWP 25 mmHg, CI  1.8.  Repeat echo (8/11) with EF 15- 123456, severe diastolic dysfunction, mild MR, RV normal size and systolic function.    Marland Kitchen Uncontrolled type II diabetes mellitus   . HTN (hypertension)   . Diabetic neuropathy     severe; with chronic gait dysfunction  . Foot ulcer, left     poor healing  . Atrial flutter     s/p DCCV 9/11  . ICD (implantable cardiac defibrillator) in place 11/11    Medtronic dual cha,ber ICD with Optivol     Review of Systems Constitutional: Negative for fever or weight change. complains of fatigue Respiratory: Negative for cough and shortness of breath.   Cardiovascular: Negative for chest pain or palpitations.      Objective:   Physical Exam BP 110/62  Pulse 55  Temp 98.7 F (37.1 C) (Oral)  Ht 6\' 4"  (1.93 m)  Wt 257 lb 12.8 oz (116.937 kg)  BMI 31.38 kg/m2  SpO2 97% Wt Readings from Last 3 Encounters:  06/24/12 257 lb 12.8 oz (116.937 kg)  06/09/12 260 lb (117.935 kg)  03/01/12 253 lb (114.76 kg)    Constitutional:  He appears well-developed and well-nourished. No distress. dtr at side Neck: Normal range of motion. Neck supple. No JVD present. No thyromegaly present.  Cardiovascular: Normal rate, regular rhythm and normal heart sounds.  No murmur heard. no BLE edema Pulmonary/Chest: Effort normal and breath sounds normal. No respiratory distress. no wheezes.  Neurological: he is alert and oriented to person, place, and  time. No cranial nerve deficit. high stepping gait due to B foot drop L>R - poor balance and proprioception - alert & oriented X3 and cranial nerves II-XII symetrically intact.  strength normal in all extremities, sensation intact to light touch. speech fluent without dysarthria or aphasia; follows commands with good comprehension.   Psychiatric: he has a normal mood and affect. behavior is normal. Judgment and thought content normal.   Lab Results  Component Value Date   WBC 7.9 03/28/2010   HGB 14.2 03/28/2010   HCT 42.2 03/28/2010    PLT 197.0 03/28/2010   GLUCOSE 168* 06/09/2012   CHOL 147 06/09/2012   TRIG 140.0 06/09/2012   HDL 28.50* 06/09/2012   LDLCALC 91 06/09/2012   ALT 37 11/13/2010   AST 25 11/13/2010   NA 140 06/09/2012   K 4.6 06/09/2012   CL 105 06/09/2012   CREATININE 1.5 06/09/2012   BUN 30* 06/09/2012   CO2 27 06/09/2012   TSH 0.954 *Test methodology is 3rd generation TSH* 11/06/2009   INR 2.8 06/09/2012   HGBA1C 10.5* 11/28/2009   MICROALBUR 1.8 11/28/2009        Assessment & Plan:   See problem list. Medications and labs reviewed today.  Fatigue - nonspecific symptoms/exam - check screening labs

## 2012-06-24 NOTE — Addendum Note (Signed)
Addended by: Gwendolyn Grant A on: 06/24/2012 04:46 PM   Modules accepted: Orders

## 2012-06-24 NOTE — Assessment & Plan Note (Signed)
BP Readings from Last 3 Encounters:  06/24/12 110/62  06/09/12 104/60  03/01/12 140/76   The current medical regimen is effective;  continue present plan and medications.

## 2012-06-24 NOTE — Assessment & Plan Note (Signed)
Unable to use metformin due to CKD On OSA - also ARB, statin and ASA New glucometer and supplies today Check a1c and add meds/titrate as needed Lab Results  Component Value Date   HGBA1C 10.5* 11/28/2009

## 2012-06-27 ENCOUNTER — Telehealth: Payer: Self-pay | Admitting: *Deleted

## 2012-06-27 MED ORDER — GLIPIZIDE ER 10 MG PO TB24: 10.0000 mg | ORAL_TABLET | Freq: Every day | ORAL | Status: DC

## 2012-06-27 NOTE — Telephone Encounter (Signed)
PATIENT RETURNED CALL. ORDERS NOTED PER DR. Asa Lente READ TO PATIENT CONCERNING HIS DIABETES AND LAB NOTES PER DR. Asa Lente. PATIENT STATES WENT TO [facility] TO GET NEW MEDICATION AND CAN NOT AFFORD THE JANUVIA. COST IS $315.00 AND CAN NOT AFFORD . PLEASE ADVISE. PATIENT CALL BACK # [phone]

## 2012-06-27 NOTE — Telephone Encounter (Signed)
januvia removed from med list Increase glipizide dose to 10mg  daily - new erx done

## 2012-06-28 NOTE — Telephone Encounter (Signed)
Notified pt with md response.../lmb 

## 2012-07-05 ENCOUNTER — Encounter: Payer: Self-pay | Admitting: *Deleted

## 2012-07-07 ENCOUNTER — Telehealth: Payer: Self-pay | Admitting: Internal Medicine

## 2012-07-07 MED ORDER — GLUCOSE BLOOD VI STRP: ORAL_STRIP | Status: DC

## 2012-07-07 MED ORDER — ONETOUCH LANCETS MISC: Status: DC

## 2012-07-07 NOTE — Telephone Encounter (Signed)
Inform pt will leave BS monitor & send supplies to his pharmacy...lmb

## 2012-07-07 NOTE — Telephone Encounter (Signed)
Patient said at his last visit he was told we could get him a sample meter to check his blood sugar and he would like to come by and pick one up, call if available

## 2012-07-11 ENCOUNTER — Telehealth: Payer: Self-pay | Admitting: Internal Medicine

## 2012-07-11 NOTE — Telephone Encounter (Signed)
Pt has a One Touch Evoio IQ meter.  The test strips are over $300.00 at [facility].  He wants a RX for a Relion Prime meter and the test strips to go with it because [facility] told him it would be cheaper.   [facility] on [address].

## 2012-07-11 NOTE — Telephone Encounter (Signed)
Ok to change equipment/supplies as requested - thanks

## 2012-07-12 MED ORDER — GLUCOSE BLOOD VI STRP: ORAL_STRIP | Status: DC

## 2012-07-12 MED ORDER — RELION PRIME MONITOR DEVI: 1.0000 | Status: DC

## 2012-07-12 MED ORDER — RELION LANCETS STANDARD 21G MISC: 21.0000 g | Freq: Two times a day (BID) | Status: DC

## 2012-07-12 NOTE — Telephone Encounter (Signed)
Notified pt sent supplies to [facility]...lmb

## 2012-07-16 ENCOUNTER — Other Ambulatory Visit: Payer: Self-pay | Admitting: Cardiology

## 2012-07-19 ENCOUNTER — Other Ambulatory Visit: Payer: Self-pay

## 2012-07-19 MED ORDER — WARFARIN SODIUM 5 MG PO TABS: ORAL_TABLET | ORAL | Status: DC

## 2012-07-21 ENCOUNTER — Ambulatory Visit (INDEPENDENT_AMBULATORY_CARE_PROVIDER_SITE_OTHER): Payer: Medicare Other | Admitting: *Deleted

## 2012-07-21 ENCOUNTER — Encounter: Payer: Self-pay | Admitting: Internal Medicine

## 2012-07-21 DIAGNOSIS — I5022 Chronic systolic (congestive) heart failure: Secondary | ICD-10-CM

## 2012-07-21 DIAGNOSIS — I4892 Unspecified atrial flutter: Secondary | ICD-10-CM | POA: Diagnosis not present

## 2012-07-21 LAB — ICD DEVICE OBSERVATION
ATRIAL PACING ICD: 28.04 pct
BAMS-0001: 155 {beats}/min
DEV-0020ICD: NEGATIVE
FVT: 0
PACEART VT: 0
RV LEAD THRESHOLD: 0.75 V
TOT-0001: 1
TZAT-0001ATACH: 1
TZAT-0001ATACH: 2
TZAT-0001FASTVT: 1
TZAT-0001SLOWVT: 2
TZAT-0002ATACH: NEGATIVE
TZAT-0002ATACH: NEGATIVE
TZAT-0002ATACH: NEGATIVE
TZAT-0012ATACH: 150 ms
TZAT-0012FASTVT: 170 ms
TZAT-0012SLOWVT: 170 ms
TZAT-0018ATACH: NEGATIVE
TZAT-0018ATACH: NEGATIVE
TZAT-0018ATACH: NEGATIVE
TZAT-0019ATACH: 6 V
TZAT-0019SLOWVT: 8 V
TZAT-0020ATACH: 1.5 ms
TZAT-0020FASTVT: 1.5 ms
TZAT-0020SLOWVT: 1.5 ms
TZAT-0020SLOWVT: 1.5 ms
TZON-0003ATACH: 390 ms
TZON-0003SLOWVT: 400 ms
TZON-0004SLOWVT: 16
TZON-0004VSLOWVT: 32
TZST-0001FASTVT: 2
TZST-0001FASTVT: 4
TZST-0001FASTVT: 6
TZST-0001SLOWVT: 3
TZST-0001SLOWVT: 4
TZST-0001SLOWVT: 5
TZST-0002ATACH: NEGATIVE
TZST-0002ATACH: NEGATIVE
TZST-0002FASTVT: NEGATIVE
TZST-0002FASTVT: NEGATIVE
TZST-0002FASTVT: NEGATIVE
TZST-0002SLOWVT: NEGATIVE
TZST-0002SLOWVT: NEGATIVE
VENTRICULAR PACING ICD: 0.09 pct
VF: 0

## 2012-07-21 NOTE — Patient Instructions (Addendum)
Return office visit 10/27/12 @ 9:45am with Dr. Rayann Heman.

## 2012-07-21 NOTE — Progress Notes (Signed)
ICD check with ICM 

## 2012-09-01 ENCOUNTER — Ambulatory Visit (INDEPENDENT_AMBULATORY_CARE_PROVIDER_SITE_OTHER): Payer: Medicare Other | Admitting: *Deleted

## 2012-09-01 DIAGNOSIS — I4892 Unspecified atrial flutter: Secondary | ICD-10-CM | POA: Diagnosis not present

## 2012-09-15 ENCOUNTER — Ambulatory Visit (INDEPENDENT_AMBULATORY_CARE_PROVIDER_SITE_OTHER): Payer: Medicare Other

## 2012-09-15 DIAGNOSIS — I4892 Unspecified atrial flutter: Secondary | ICD-10-CM

## 2012-09-22 ENCOUNTER — Ambulatory Visit: Payer: Medicare Other | Admitting: Internal Medicine

## 2012-10-12 ENCOUNTER — Encounter: Payer: Self-pay | Admitting: Internal Medicine

## 2012-10-13 ENCOUNTER — Ambulatory Visit (INDEPENDENT_AMBULATORY_CARE_PROVIDER_SITE_OTHER): Payer: Medicare Other | Admitting: *Deleted

## 2012-10-13 DIAGNOSIS — I4892 Unspecified atrial flutter: Secondary | ICD-10-CM

## 2012-10-13 LAB — POCT INR: INR: 2.3

## 2012-10-17 ENCOUNTER — Encounter: Payer: Medicare Other | Admitting: Internal Medicine

## 2012-10-27 ENCOUNTER — Encounter: Payer: Medicare Other | Admitting: Internal Medicine

## 2012-11-04 ENCOUNTER — Other Ambulatory Visit: Payer: Self-pay | Admitting: Cardiology

## 2012-11-10 ENCOUNTER — Ambulatory Visit (INDEPENDENT_AMBULATORY_CARE_PROVIDER_SITE_OTHER): Payer: Medicare Other | Admitting: Cardiology

## 2012-11-10 ENCOUNTER — Encounter: Payer: Self-pay | Admitting: Cardiology

## 2012-11-10 ENCOUNTER — Encounter: Payer: Self-pay | Admitting: Internal Medicine

## 2012-11-10 ENCOUNTER — Ambulatory Visit (INDEPENDENT_AMBULATORY_CARE_PROVIDER_SITE_OTHER): Payer: Medicare Other | Admitting: *Deleted

## 2012-11-10 VITALS — BP 118/81 | HR 63 | Ht 76.0 in | Wt 259.8 lb

## 2012-11-10 DIAGNOSIS — I2589 Other forms of chronic ischemic heart disease: Secondary | ICD-10-CM

## 2012-11-10 DIAGNOSIS — Z95810 Presence of automatic (implantable) cardiac defibrillator: Secondary | ICD-10-CM

## 2012-11-10 DIAGNOSIS — I4892 Unspecified atrial flutter: Secondary | ICD-10-CM | POA: Diagnosis not present

## 2012-11-10 DIAGNOSIS — I5022 Chronic systolic (congestive) heart failure: Secondary | ICD-10-CM

## 2012-11-10 DIAGNOSIS — I10 Essential (primary) hypertension: Secondary | ICD-10-CM

## 2012-11-10 DIAGNOSIS — I255 Ischemic cardiomyopathy: Secondary | ICD-10-CM

## 2012-11-10 DIAGNOSIS — R0989 Other specified symptoms and signs involving the circulatory and respiratory systems: Secondary | ICD-10-CM

## 2012-11-10 LAB — ICD DEVICE OBSERVATION
BAMS-0001: 155 {beats}/min
BATTERY VOLTAGE: 3.13 V
DEV-0020ICD: NEGATIVE
FVT: 0
MODE SWITCH EPISODES: 0
RV LEAD AMPLITUDE: 7.4 mv
TZAT-0001ATACH: 1
TZAT-0001FASTVT: 1
TZAT-0001SLOWVT: 2
TZAT-0002ATACH: NEGATIVE
TZAT-0002ATACH: NEGATIVE
TZAT-0002FASTVT: NEGATIVE
TZAT-0002SLOWVT: NEGATIVE
TZAT-0012ATACH: 150 ms
TZAT-0012SLOWVT: 170 ms
TZAT-0018FASTVT: NEGATIVE
TZAT-0018SLOWVT: NEGATIVE
TZAT-0019ATACH: 6 V
TZAT-0019ATACH: 6 V
TZAT-0019ATACH: 6 V
TZAT-0019FASTVT: 8 V
TZAT-0020ATACH: 1.5 ms
TZAT-0020SLOWVT: 1.5 ms
TZON-0003ATACH: 390 ms
TZON-0003SLOWVT: 400 ms
TZON-0004SLOWVT: 16
TZON-0005SLOWVT: 12
TZST-0001ATACH: 4
TZST-0001ATACH: 5
TZST-0001FASTVT: 2
TZST-0001FASTVT: 3
TZST-0001FASTVT: 4
TZST-0001FASTVT: 6
TZST-0001SLOWVT: 3
TZST-0001SLOWVT: 4
TZST-0001SLOWVT: 5
TZST-0002FASTVT: NEGATIVE
TZST-0002FASTVT: NEGATIVE
TZST-0002FASTVT: NEGATIVE
TZST-0002SLOWVT: NEGATIVE
TZST-0002SLOWVT: NEGATIVE
TZST-0002SLOWVT: NEGATIVE
VENTRICULAR PACING ICD: 0 pct
VF: 0

## 2012-11-10 LAB — POCT INR: INR: 1.9

## 2012-11-10 NOTE — Progress Notes (Signed)
ELECTROPHYSIOLOGY OFFICE NOTE  Patient ID: Anthony Rubio. MRN: [id number], DOB/AGE: 1951/01/01   Date of Visit: 11/10/2012  Primary Physician: Gwendolyn Grant, MD Primary Cardiologist / EP: Aundra Dubin, MD / Rayann Heman, MD Reason for Visit: EP/device follow-up  History of Present Illness  Anthony Dejardin. is a 62 year old man with a mixed ischemic/nonischemic CM s/p dual chamber ICD implant, chronic systolic HF NYHA class III, atrial flutter s/p DCCV 2011 and HTN who presents today for routine electrophysiology followup. Since last being seen in our clinic, he reports he is doing well. He has no cardiac complaints. He has chronic gait instability due to diabetic peripheral neuropathy. Today, he denies chest pain or shortness of breath. He denies palpitations, dizziness, near syncope or syncope. He denies LE swelling, orthopnea, PND or recent weight gain. Anthony Rubio reports that he is compliant and tolerating medications without difficulty.  Past Medical History Past Medical History  Diagnosis Date  . CAD (coronary artery disease)     LHC (5/11) with 50-60% mid LAD, diffuse moderate distal LAD up to 60-70%, small OM2 subtotally  occluded, moderate to severe diffuse distal PLOM disease, severe diffuse distal RCA, PDA, and PLV  disease.  Coronaries were ectatic with slow flow.  Patient was started on Plavix given concern for thrombus  formation in the ectatic coronaries.  He stopped Plavix after starting coumadin with atrial flut  . CHF (congestive heart failure)     Systolic dysfunction, likely mixed ischemic/nonischemic.  Echo (5/11) with EF 15-20%, severe  global hypokinesis, mild MR, moderate diastolic dysfunction, mildly decreased RV systolic function.  RHC  (5/11) with mean RA 14 mmHg, PA 38/26, mean PCWP 25 mmHg, CI 1.8.  Repeat echo (8/11) with EF 15- 123456, severe diastolic dysfunction, mild MR, RV normal size and systolic function.    Marland Kitchen Uncontrolled type II diabetes mellitus   . HTN (hypertension)     . Diabetic neuropathy     severe; with chronic gait dysfunction  . Foot ulcer, left     poor healing  . Atrial flutter     s/p DCCV 9/11  . ICD (implantable cardiac defibrillator) in place 11/11    Medtronic dual cha,ber ICD with Optivol    Past Surgical History Past Surgical History  Procedure Laterality Date  . Icd implantation  2011    by [facility]    Allergies/Intolerances Allergies  Allergen Reactions  . Erythromycin     REACTION: made toes \\T \ finger tingling  . Lisinopril Other (See Comments)    Makes him feel bad   Current Home Medications Current Outpatient Prescriptions  Medication Sig Dispense Refill  . B Complex Vitamins (B COMPLEX 100 PO) Take 1 tablet by mouth daily as needed.        . Blood Glucose Monitoring Suppl (RELION PRIME MONITOR) DEVI 1 Device by Does not apply route as directed.  1 Device  0  . carvedilol (COREG) 12.5 MG tablet Take 1.5 tablets (18.75 mg total) by mouth 2 (two) times daily.  90 tablet  6  . fish oil-omega-3 fatty acids 1000 MG capsule Take 2 capsules (2 g total) by mouth daily.  60 capsule  11  . furosemide (LASIX) 40 MG tablet TAKE ONE-HALF TABLET BY MOUTH EVERY DAY  30 tablet  1  . glipiZIDE (GLUCOTROL XL) 10 MG 24 hr tablet Take 1 tablet (10 mg total) by mouth daily.  30 tablet  11  . glucose blood (RELION PRIME TEST) test strip Use  to check blood sugar twice a day dx 250.00  100 each  3  . hydrALAZINE (APRESOLINE) 50 MG tablet Take 1 tablet (50 mg total) by mouth 3 (three) times daily.  270 tablet  3  . isosorbide dinitrate (ISORDIL) 30 MG tablet Take 1 tablet (30 mg total) by mouth 3 (three) times daily.  90 tablet  11  . LISINOPRIL PO Take 1 tablet by mouth 2 (two) times daily.      . Multiple Vitamin (MULTIVITAMIN) tablet Take 1 tablet by mouth daily.        . ONE TOUCH LANCETS MISC Use 1 twice a day to check blood sugars. Dx 250.00  100 each  1  . RELION LANCETS STANDARD 21G MISC 21 g by Does not apply route 2 (two) times daily. Use to  check blood sugar twice a day. Dx 250.00  100 each  3  . rosuvastatin (CRESTOR) 40 MG tablet Take 40 mg by mouth daily.        Marland Kitchen spironolactone (ALDACTONE) 25 MG tablet TAKE ONE TABLET BY MOUTH EVERY DAY  30 tablet  5  . warfarin (COUMADIN) 5 MG tablet Take as directed by Coumadin Clinic  50 tablet  3   No current facility-administered medications for this visit.   Social History Social History  . Marital Status: Married   Occupational History  . Retired Barrister's clerk   . Former [employer]    Social History Main Topics  . Smoking status: Current Some Day Smoker  . Smokeless tobacco: No     Comment: about 1/4 ppd. Married, lives with wife. 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 Alcohol Use: No  . Drug Use: No    Review of Systems General: No chills, fever, night sweats or weight changes Cardiovascular: No chest pain, dyspnea on exertion, edema, orthopnea, palpitations, paroxysmal nocturnal dyspnea Dermatological: No rash, lesions or masses Respiratory: No cough, dyspnea Urologic: No hematuria, dysuria Abdominal: No nausea, vomiting, diarrhea, bright red blood per rectum, melena, or hematemesis Neurologic: No visual changes, weakness, changes in mental status All other systems reviewed and are otherwise negative except as noted above.  Physical Exam Blood pressure 118/81, pulse 124, height 6\' 4"  (1.93 m), weight 259 lb 12.8 oz (117.845 kg).  General: Well developed, well appearing 62 year old male in no acute distress. HEENT: Normocephalic, atraumatic. EOMs intact. Sclera nonicteric. Oropharynx clear.  Neck: Supple without bruits. No JVD. Lungs: Respirations regular and unlabored, CTA bilaterally. No wheezes, rales or rhonchi. Heart: RRR. S1, S2 present. No murmurs, rub, S3 or S4. Abdomen: Soft, non-distended.  Extremities: No clubbing, cyanosis or edema. PT/Radials 2+ and equal bilaterally. Psych: Normal affect. Neuro: Alert and oriented X 3. Moves all  extremities spontaneously.   Diagnostics Device interrogation today - Normal device function. Thresholds and sensing consistent with previous device measurements. Impedance trends stable over time. No evidence of any ventricular arrhythmias. No mode switches. Histogram distribution appropriate for patient and level of activity. OptiVol reviewed and stable. No changes made this session. Device programmed at appropriate safety margins. Device programmed to optimize intrinsic conduction.   Assessment and Plan 1. Ischemic CM s/p ICD implant Normal device function No arrhythmias/episodes No programming changes made Return for routine device clinic follow-up in 3 months Return to clinic for follow-up with Dr. Rayann Heman in one year 2. Chronic systolic HF Stable; HF symptoms stable; euvolemic by exam today OptiVol reviewed and stable Continue medical therapy and low sodium diet, daily  weights 3. Atrial flutter Stable without recurrence Continue warfarin for embolic prophylaxis 4. HTN Stable; normotensive today Continue current regimen  Signed, Shakeera Rightmyer, PA-C 11/10/2012, 9:29 AM

## 2012-11-10 NOTE — Patient Instructions (Signed)
Your physician recommends that you follow-up with Nevin Bloodgood and Erasmo Downer in the [facility] on 02/09/2013 @ 9:00 am.  Your physician wants you to follow-up in: 1 year with Dr. Rayann Heman.You will receive a reminder letter in the mail two months in advance. If you don't receive a letter, please call our office to schedule the follow-up appointment.  Your physician recommends that you continue on your current medications as directed. Please refer to the Current Medication list given to you today.

## 2012-12-08 ENCOUNTER — Ambulatory Visit (INDEPENDENT_AMBULATORY_CARE_PROVIDER_SITE_OTHER): Payer: Medicare Other | Admitting: *Deleted

## 2012-12-08 DIAGNOSIS — I4892 Unspecified atrial flutter: Secondary | ICD-10-CM

## 2012-12-08 LAB — POCT INR: INR: 1.7

## 2013-01-05 ENCOUNTER — Ambulatory Visit (INDEPENDENT_AMBULATORY_CARE_PROVIDER_SITE_OTHER): Payer: Medicare Other | Admitting: *Deleted

## 2013-01-05 DIAGNOSIS — I4892 Unspecified atrial flutter: Secondary | ICD-10-CM

## 2013-01-05 LAB — POCT INR: INR: 1.6

## 2013-01-26 ENCOUNTER — Ambulatory Visit (INDEPENDENT_AMBULATORY_CARE_PROVIDER_SITE_OTHER): Payer: Medicare Other | Admitting: *Deleted

## 2013-01-26 DIAGNOSIS — I4892 Unspecified atrial flutter: Secondary | ICD-10-CM

## 2013-01-26 LAB — POCT INR: INR: 3.3

## 2013-02-23 ENCOUNTER — Ambulatory Visit (INDEPENDENT_AMBULATORY_CARE_PROVIDER_SITE_OTHER): Payer: Medicare Other | Admitting: *Deleted

## 2013-02-23 DIAGNOSIS — I4892 Unspecified atrial flutter: Secondary | ICD-10-CM

## 2013-02-23 DIAGNOSIS — I428 Other cardiomyopathies: Secondary | ICD-10-CM

## 2013-02-23 DIAGNOSIS — I5022 Chronic systolic (congestive) heart failure: Secondary | ICD-10-CM | POA: Diagnosis not present

## 2013-02-23 LAB — ICD DEVICE OBSERVATION
AL IMPEDENCE ICD: 456 Ohm
BATTERY VOLTAGE: 3.12 V
RV LEAD IMPEDENCE ICD: 380 Ohm
TZAT-0001ATACH: 3
TZAT-0001FASTVT: 1
TZAT-0001SLOWVT: 1
TZAT-0002ATACH: NEGATIVE
TZAT-0002FASTVT: NEGATIVE
TZAT-0002SLOWVT: NEGATIVE
TZAT-0002SLOWVT: NEGATIVE
TZAT-0012ATACH: 150 ms
TZAT-0012ATACH: 150 ms
TZAT-0018ATACH: NEGATIVE
TZAT-0018SLOWVT: NEGATIVE
TZAT-0019ATACH: 6 V
TZAT-0019SLOWVT: 8 V
TZAT-0019SLOWVT: 8 V
TZAT-0020ATACH: 1.5 ms
TZAT-0020ATACH: 1.5 ms
TZON-0005SLOWVT: 12
TZST-0001ATACH: 6
TZST-0001FASTVT: 3
TZST-0001FASTVT: 4
TZST-0001SLOWVT: 4
TZST-0001SLOWVT: 6
TZST-0002ATACH: NEGATIVE
TZST-0002ATACH: NEGATIVE
TZST-0002FASTVT: NEGATIVE
TZST-0002FASTVT: NEGATIVE
TZST-0002FASTVT: NEGATIVE
TZST-0002SLOWVT: NEGATIVE
VENTRICULAR PACING ICD: 0.2 pct

## 2013-02-23 LAB — POCT INR: INR: 1.9

## 2013-02-23 NOTE — Progress Notes (Signed)
ICD check with ICM in office.

## 2013-03-01 ENCOUNTER — Other Ambulatory Visit: Payer: Self-pay | Admitting: Cardiology

## 2013-03-09 ENCOUNTER — Other Ambulatory Visit: Payer: Self-pay | Admitting: Cardiology

## 2013-03-11 ENCOUNTER — Encounter: Payer: Self-pay | Admitting: Internal Medicine

## 2013-03-23 ENCOUNTER — Ambulatory Visit (INDEPENDENT_AMBULATORY_CARE_PROVIDER_SITE_OTHER): Payer: Medicare Other | Admitting: Pharmacist

## 2013-03-23 DIAGNOSIS — I4892 Unspecified atrial flutter: Secondary | ICD-10-CM | POA: Diagnosis not present

## 2013-03-23 LAB — POCT INR: INR: 4.9

## 2013-04-07 ENCOUNTER — Ambulatory Visit (INDEPENDENT_AMBULATORY_CARE_PROVIDER_SITE_OTHER): Payer: Medicare Other | Admitting: *Deleted

## 2013-04-07 DIAGNOSIS — I4892 Unspecified atrial flutter: Secondary | ICD-10-CM

## 2013-04-27 ENCOUNTER — Ambulatory Visit (INDEPENDENT_AMBULATORY_CARE_PROVIDER_SITE_OTHER): Payer: Medicare Other | Admitting: *Deleted

## 2013-04-27 DIAGNOSIS — I4892 Unspecified atrial flutter: Secondary | ICD-10-CM

## 2013-05-25 ENCOUNTER — Ambulatory Visit (INDEPENDENT_AMBULATORY_CARE_PROVIDER_SITE_OTHER): Payer: Medicare Other | Admitting: *Deleted

## 2013-05-25 DIAGNOSIS — I4892 Unspecified atrial flutter: Secondary | ICD-10-CM

## 2013-05-25 LAB — POCT INR: INR: 2.1

## 2013-06-06 ENCOUNTER — Other Ambulatory Visit: Payer: Self-pay | Admitting: Cardiology

## 2013-06-08 ENCOUNTER — Ambulatory Visit (INDEPENDENT_AMBULATORY_CARE_PROVIDER_SITE_OTHER): Payer: Medicare Other | Admitting: *Deleted

## 2013-06-08 ENCOUNTER — Ambulatory Visit (INDEPENDENT_AMBULATORY_CARE_PROVIDER_SITE_OTHER): Payer: Medicare Other | Admitting: General Practice

## 2013-06-08 ENCOUNTER — Encounter: Payer: Self-pay | Admitting: Internal Medicine

## 2013-06-08 DIAGNOSIS — I4892 Unspecified atrial flutter: Secondary | ICD-10-CM | POA: Diagnosis not present

## 2013-06-08 DIAGNOSIS — Z95810 Presence of automatic (implantable) cardiac defibrillator: Secondary | ICD-10-CM | POA: Diagnosis not present

## 2013-06-08 DIAGNOSIS — I5022 Chronic systolic (congestive) heart failure: Secondary | ICD-10-CM | POA: Diagnosis not present

## 2013-06-08 LAB — MDC_IDC_ENUM_SESS_TYPE_INCLINIC
Battery Voltage: 3.09 V
Brady Statistic AP VP Percent: 0.08 %
Brady Statistic RA Percent Paced: 27.01 %
Brady Statistic RV Percent Paced: 0.12 %
Date Time Interrogation Session: 20141204090531
HighPow Impedance: 190 Ohm
Lead Channel Impedance Value: 399 Ohm
Lead Channel Pacing Threshold Amplitude: 0.75 V
Lead Channel Pacing Threshold Amplitude: 1 V
Lead Channel Pacing Threshold Pulse Width: 0.4 ms
Lead Channel Sensing Intrinsic Amplitude: 7.375 mV
Lead Channel Sensing Intrinsic Amplitude: 9.875 mV
Lead Channel Setting Pacing Amplitude: 2 V
Lead Channel Setting Sensing Sensitivity: 0.3 mV
Zone Setting Detection Interval: 340 ms
Zone Setting Detection Interval: 400 ms

## 2013-06-08 NOTE — Progress Notes (Signed)
Device check in clinic, all functions normal, no changes made, full details in PaceArt.  ROV w/ Dr. Rayann Heman 09/14/13.

## 2013-07-02 ENCOUNTER — Other Ambulatory Visit: Payer: Self-pay | Admitting: Cardiology

## 2013-07-13 ENCOUNTER — Ambulatory Visit (INDEPENDENT_AMBULATORY_CARE_PROVIDER_SITE_OTHER): Payer: Medicare Other

## 2013-07-13 DIAGNOSIS — I4892 Unspecified atrial flutter: Secondary | ICD-10-CM | POA: Diagnosis not present

## 2013-07-13 LAB — POCT INR: INR: 3.2

## 2013-07-14 ENCOUNTER — Other Ambulatory Visit: Payer: Self-pay | Admitting: Internal Medicine

## 2013-07-18 ENCOUNTER — Other Ambulatory Visit: Payer: Self-pay | Admitting: Cardiology

## 2013-08-08 ENCOUNTER — Other Ambulatory Visit: Payer: Self-pay | Admitting: Cardiology

## 2013-08-10 ENCOUNTER — Ambulatory Visit (INDEPENDENT_AMBULATORY_CARE_PROVIDER_SITE_OTHER): Payer: Medicare Other

## 2013-08-10 DIAGNOSIS — I4892 Unspecified atrial flutter: Secondary | ICD-10-CM

## 2013-08-10 DIAGNOSIS — Z5181 Encounter for therapeutic drug level monitoring: Secondary | ICD-10-CM | POA: Diagnosis not present

## 2013-08-10 LAB — POCT INR: INR: 1.5

## 2013-09-04 ENCOUNTER — Other Ambulatory Visit: Payer: Self-pay | Admitting: Cardiology

## 2013-09-07 ENCOUNTER — Ambulatory Visit (INDEPENDENT_AMBULATORY_CARE_PROVIDER_SITE_OTHER): Payer: Medicare Other | Admitting: *Deleted

## 2013-09-07 DIAGNOSIS — I4892 Unspecified atrial flutter: Secondary | ICD-10-CM

## 2013-09-07 DIAGNOSIS — Z5181 Encounter for therapeutic drug level monitoring: Secondary | ICD-10-CM

## 2013-09-07 LAB — POCT INR: INR: 3.4

## 2013-09-14 ENCOUNTER — Encounter: Payer: Self-pay | Admitting: Internal Medicine

## 2013-09-14 ENCOUNTER — Ambulatory Visit (INDEPENDENT_AMBULATORY_CARE_PROVIDER_SITE_OTHER): Payer: Medicare Other | Admitting: Internal Medicine

## 2013-09-14 VITALS — BP 114/72 | HR 60 | Ht 76.0 in | Wt 248.0 lb

## 2013-09-14 DIAGNOSIS — I5022 Chronic systolic (congestive) heart failure: Secondary | ICD-10-CM | POA: Diagnosis not present

## 2013-09-14 DIAGNOSIS — I2510 Atherosclerotic heart disease of native coronary artery without angina pectoris: Secondary | ICD-10-CM

## 2013-09-14 DIAGNOSIS — I10 Essential (primary) hypertension: Secondary | ICD-10-CM

## 2013-09-14 DIAGNOSIS — I2589 Other forms of chronic ischemic heart disease: Secondary | ICD-10-CM | POA: Diagnosis not present

## 2013-09-14 DIAGNOSIS — I4892 Unspecified atrial flutter: Secondary | ICD-10-CM

## 2013-09-14 DIAGNOSIS — I255 Ischemic cardiomyopathy: Secondary | ICD-10-CM

## 2013-09-14 LAB — BASIC METABOLIC PANEL
BUN: 29 mg/dL — AB (ref 6–23)
CO2: 26 mEq/L (ref 19–32)
CREATININE: 1.6 mg/dL — AB (ref 0.4–1.5)
Calcium: 9.1 mg/dL (ref 8.4–10.5)
Chloride: 107 mEq/L (ref 96–112)
GFR: 56.65 mL/min — AB (ref >60.00)
GLUCOSE: 166 mg/dL — AB (ref 70–99)
Potassium: 4.7 mEq/L (ref 3.5–5.1)
Sodium: 143 mEq/L (ref 135–145)

## 2013-09-14 MED ORDER — LISINOPRIL 2.5 MG PO TABS: 2.5000 mg | ORAL_TABLET | Freq: Every day | ORAL | Status: DC

## 2013-09-14 NOTE — Patient Instructions (Addendum)
Your physician wants you to follow-up in: 12 months with Dr Vallery Ridge will receive a reminder letter in the mail two months in advance. If you don't receive a letter, please call our office to schedule the follow-up appointment.   Remote monitoring is used to monitor your Pacemaker or ICD from home. This monitoring reduces the number of office visits required to check your device to one time per year. It allows [place] to keep an eye on the functioning of your device to ensure it is working properly. You are scheduled for a device check from home on 12/14/13. You may send your transmission at any time that day. If you have a wireless device, the transmission will be sent automatically. After your physician reviews your transmission, you will receive a postcard with your next transmission date.  Call number for cellular adaptor    Your physician recommends that you return for lab work today  Your physician has recommended you make the following change in your medication:  1) Decrease Lisinopril to 2.5mg  daily

## 2013-09-17 LAB — MDC_IDC_ENUM_SESS_TYPE_INCLINIC
Battery Voltage: 3.09 V
Brady Statistic AP VP Percent: 0.1 %
Brady Statistic AP VS Percent: 16.5 %
Brady Statistic AS VP Percent: 0.1 %
Brady Statistic AS VS Percent: 83.4 %
Lead Channel Impedance Value: 399 Ohm
Lead Channel Impedance Value: 437 Ohm
Lead Channel Pacing Threshold Amplitude: 0.625 V
Lead Channel Pacing Threshold Amplitude: 1 V
Lead Channel Pacing Threshold Pulse Width: 0.4 ms
Lead Channel Pacing Threshold Pulse Width: 0.4 ms
Lead Channel Sensing Intrinsic Amplitude: 2.8 mV
Lead Channel Sensing Intrinsic Amplitude: 6.8 mV
Lead Channel Setting Pacing Amplitude: 2 V
Lead Channel Setting Pacing Amplitude: 2.5 V
Lead Channel Setting Pacing Pulse Width: 0.4 ms
Lead Channel Setting Sensing Sensitivity: 0.3 mV
Zone Setting Detection Interval: 300 ms
Zone Setting Detection Interval: 340 ms
Zone Setting Detection Interval: 390 ms
Zone Setting Detection Interval: 400 ms

## 2013-09-18 NOTE — Progress Notes (Signed)
PCP:  Gwendolyn Grant, MD Primary Cardiologist:  Dr Aundra Dubin  The patient presents today for routine electrophysiology followup.  Since last being seen in our clinic, the patient reports doing very well.  He has occasional dizziness which is worse when taking lisinopril.   Today, he denies symptoms of palpitations, chest pain, shortness of breath, orthopnea, PND, lower extremity edema,  presyncope, syncope, or neurologic sequela.  The patient feels that he is tolerating medications without difficulties and is otherwise without complaint today.   Past Medical History  Diagnosis Date  . CAD (coronary artery disease)     LHC (5/11) with 50-60% mid LAD, diffuse moderate distal LAD up to 60-70%, small OM2 subtotally  occluded, moderate to severe diffuse distal PLOM disease, severe diffuse distal RCA, PDA, and PLV  disease.  Coronaries were ectatic with slow flow.  Patient was started on Plavix given concern for thrombus  formation in the ectatic coronaries.  He stopped Plavix after starting coumadin with atrial flut  . CHF (congestive heart failure)     Systolic dysfunction, likely mixed ischemic/nonischemic.  Echo (5/11) with EF 15-20%, severe  global hypokinesis, mild MR, moderate diastolic dysfunction, mildly decreased RV systolic function.  RHC  (5/11) with mean RA 14 mmHg, PA 38/26, mean PCWP 25 mmHg, CI 1.8.  Repeat echo (8/11) with EF 15- 123456, severe diastolic dysfunction, mild MR, RV normal size and systolic function.    Marland Kitchen Uncontrolled type II diabetes mellitus   . HTN (hypertension)   . Diabetic neuropathy     severe; with chronic gait dysfunction  . Foot ulcer, left     poor healing  . Atrial flutter     s/p DCCV 9/11  . ICD (implantable cardiac defibrillator) in place 11/11    Medtronic dual cha,ber ICD with Optivol   Past Surgical History  Procedure Laterality Date  . Icd implantation  2011    by [facility]    Current Outpatient Prescriptions  Medication Sig Dispense Refill  . B Complex  Vitamins (B COMPLEX 100 PO) Take 1 tablet by mouth daily as needed.        . carvedilol (COREG) 12.5 MG tablet TAKE ONE AND ONE-HALF TABLETS BY MOUTH DAILY      . fish oil-omega-3 fatty acids 1000 MG capsule Take 2 g by mouth as needed.   60 capsule  11  . furosemide (LASIX) 40 MG tablet TAKE ONE-HALF TABLET BY MOUTH EVERY DAY.  30 tablet  3  . glipiZIDE (GLUCOTROL XL) 10 MG 24 hr tablet Take 1 tablet (10 mg total) by mouth daily.  30 tablet  11  . hydrALAZINE (APRESOLINE) 50 MG tablet TAKE ONE TABLET BY MOUTH THREE TIMES DAILY  270 tablet  0  . isosorbide dinitrate (ISORDIL) 30 MG tablet TAKE ONE TABLET BY MOUTH DAILY      . lisinopril (PRINIVIL,ZESTRIL) 2.5 MG tablet Take 1 tablet (2.5 mg total) by mouth daily.  60 tablet  3  . Multiple Vitamin (MULTIVITAMIN) tablet Take 1 tablet by mouth daily.        . rosuvastatin (CRESTOR) 40 MG tablet Take 40 mg by mouth daily.        Marland Kitchen spironolactone (ALDACTONE) 25 MG tablet TAKE ONE TABLET BY MOUTH ONCE DAILY      . warfarin (COUMADIN) 5 MG tablet TAKE AS DIRECTED BY COUMADIN CLINIC  50 tablet  3   No current facility-administered medications for this visit.    Allergies  Allergen Reactions  . Erythromycin  REACTION: made toes \\T \ finger tingling  . Lisinopril Other (See Comments)    Makes him feel bad    History   Social History  . Marital Status: Married    Spouse Name: N/A    Number of Children: N/A  . Years of Education: N/A   Occupational History  . Retired Barrister's clerk   . Former [employer]    Social History Main Topics  . Smoking status: Current Some Day Smoker  . Smokeless tobacco: Not on file     Comment: about 1/4 ppd. Married, lives with wife. 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 Alcohol Use: No  . Drug Use: Not on file  . Sexual Activity: Not on file   Other Topics Concern  . Not on file   Social History Narrative  . No narrative on file    Family History  Problem Relation Age of  Onset  . Colon cancer    . Heart disease    . Stroke    . Diabetes       Physical Exam: Filed Vitals:   09/14/13 0911  BP: 114/72  Pulse: 60  Height: 6\' 4"  (1.93 m)  Weight: 248 lb (112.492 kg)    GEN- The patient is well appearing, alert and oriented x 3 today.   Head- normocephalic, atraumatic Eyes-  Sclera clear, conjunctiva pink Ears- hearing intact Oropharynx- clear Neck- supple  Lungs- Clear to ausculation bilaterally, normal work of breathing Chest- ICD pocket is well healed Heart- Regular rate and rhythm, no murmurs, rubs or gallops, PMI not laterally displaced GI- soft, NT, ND, + BS Extremities- no clubbing, cyanosis, or edema In a wheelchair today  ICD interrogation- reviewed in detail today,  See PACEART report  Assessment and Plan:   1. Chronic systolic dysfunction Normal ICD function See Pace Art report No changes today He has dizziness with lisinopril.  I have spoken with Dr Aundra Dubin and we agree to decrease the dose to once daily  2. HTN decreas lisinopril as above due to dizziness  3. CAD No ischemic symptoms No changes today  [facility] Return in 1 year

## 2013-10-05 ENCOUNTER — Ambulatory Visit (INDEPENDENT_AMBULATORY_CARE_PROVIDER_SITE_OTHER): Payer: Medicare Other

## 2013-10-05 DIAGNOSIS — I4892 Unspecified atrial flutter: Secondary | ICD-10-CM | POA: Diagnosis not present

## 2013-10-05 DIAGNOSIS — Z5181 Encounter for therapeutic drug level monitoring: Secondary | ICD-10-CM | POA: Diagnosis not present

## 2013-10-05 LAB — POCT INR: INR: 2.6

## 2013-10-12 ENCOUNTER — Other Ambulatory Visit: Payer: Self-pay | Admitting: Cardiology

## 2013-10-12 ENCOUNTER — Other Ambulatory Visit: Payer: Self-pay | Admitting: Internal Medicine

## 2013-10-20 ENCOUNTER — Other Ambulatory Visit: Payer: Self-pay | Admitting: Cardiology

## 2013-10-20 ENCOUNTER — Ambulatory Visit: Payer: Medicare Other | Admitting: Internal Medicine

## 2013-10-23 ENCOUNTER — Ambulatory Visit (INDEPENDENT_AMBULATORY_CARE_PROVIDER_SITE_OTHER): Payer: Medicare Other | Admitting: Internal Medicine

## 2013-10-23 ENCOUNTER — Other Ambulatory Visit: Payer: Self-pay | Admitting: Internal Medicine

## 2013-10-23 ENCOUNTER — Ambulatory Visit (INDEPENDENT_AMBULATORY_CARE_PROVIDER_SITE_OTHER)
Admission: RE | Admit: 2013-10-23 | Discharge: 2013-10-23 | Disposition: A | Discharge status: Home / Self Care | Payer: Medicare Other | Source: Ambulatory Visit | Attending: Internal Medicine | Admitting: Internal Medicine

## 2013-10-23 ENCOUNTER — Encounter: Payer: Self-pay | Admitting: Internal Medicine

## 2013-10-23 ENCOUNTER — Other Ambulatory Visit (INDEPENDENT_AMBULATORY_CARE_PROVIDER_SITE_OTHER): Payer: Medicare Other

## 2013-10-23 VITALS — BP 130/82 | HR 82 | Temp 98.6°F | Ht 76.0 in | Wt 255.1 lb

## 2013-10-23 DIAGNOSIS — R05 Cough: Secondary | ICD-10-CM

## 2013-10-23 DIAGNOSIS — F17200 Nicotine dependence, unspecified, uncomplicated: Secondary | ICD-10-CM

## 2013-10-23 DIAGNOSIS — R059 Cough, unspecified: Secondary | ICD-10-CM

## 2013-10-23 DIAGNOSIS — E1149 Type 2 diabetes mellitus with other diabetic neurological complication: Secondary | ICD-10-CM | POA: Diagnosis not present

## 2013-10-23 DIAGNOSIS — I2510 Atherosclerotic heart disease of native coronary artery without angina pectoris: Secondary | ICD-10-CM | POA: Diagnosis not present

## 2013-10-23 DIAGNOSIS — E785 Hyperlipidemia, unspecified: Secondary | ICD-10-CM

## 2013-10-23 DIAGNOSIS — Z0000 Encounter for general adult medical examination without abnormal findings: Secondary | ICD-10-CM

## 2013-10-23 DIAGNOSIS — Z1211 Encounter for screening for malignant neoplasm of colon: Secondary | ICD-10-CM

## 2013-10-23 LAB — HEMOGLOBIN A1C: Hgb A1c MFr Bld: 9.1 % — ABNORMAL HIGH (ref 4.6–6.5)

## 2013-10-23 LAB — BASIC METABOLIC PANEL
BUN: 33 mg/dL — ABNORMAL HIGH (ref 6–23)
CO2: 21 mEq/L (ref 19–32)
CREATININE: 1.7 mg/dL — AB (ref 0.4–1.5)
Calcium: 9.1 mg/dL (ref 8.4–10.5)
Chloride: 105 mEq/L (ref 96–112)
GFR: 52.42 mL/min — ABNORMAL LOW (ref >60.00)
GLUCOSE: 205 mg/dL — AB (ref 70–99)
Potassium: 4.6 mEq/L (ref 3.5–5.1)
Sodium: 140 mEq/L (ref 135–145)

## 2013-10-23 LAB — HEPATIC FUNCTION PANEL
ALT: 33 U/L (ref 0–53)
AST: 22 U/L (ref 0–37)
Albumin: 4 g/dL (ref 3.5–5.2)
Alkaline Phosphatase: 52 U/L (ref 39–117)
BILIRUBIN DIRECT: 0.1 mg/dL (ref 0.0–0.3)
BILIRUBIN TOTAL: 0.7 mg/dL (ref 0.3–1.2)
Total Protein: 7.3 g/dL (ref 6.0–8.3)

## 2013-10-23 LAB — CBC WITH DIFFERENTIAL/PLATELET
BASOS ABS: 0 10*3/uL (ref 0.0–0.1)
Basophils Relative: 0.5 % (ref 0.0–3.0)
Eosinophils Absolute: 0 10*3/uL (ref 0.0–0.7)
Eosinophils Relative: 0 % (ref 0.0–5.0)
HCT: 43.6 % (ref 39.0–52.0)
Hemoglobin: 14.6 g/dL (ref 13.0–17.0)
Lymphocytes Relative: 29.7 % (ref 12.0–46.0)
Lymphs Abs: 2.3 10*3/uL (ref 0.7–4.0)
MCHC: 33.5 g/dL (ref 30.0–36.0)
MCV: 84.3 fl (ref 78.0–100.0)
MONOS PCT: 7.8 % (ref 3.0–12.0)
Monocytes Absolute: 0.6 10*3/uL (ref 0.1–1.0)
Neutro Abs: 4.7 10*3/uL (ref 1.4–7.7)
Neutrophils Relative %: 62 % (ref 43.0–77.0)
PLATELETS: 193 10*3/uL (ref 150.0–400.0)
RBC: 5.18 Mil/uL (ref 4.22–5.81)
RDW: 14.4 % (ref 11.5–14.6)
WBC: 7.6 10*3/uL (ref 4.5–10.5)

## 2013-10-23 LAB — LIPID PANEL
CHOLESTEROL: 150 mg/dL (ref 0–200)
HDL: 28.2 mg/dL — ABNORMAL LOW (ref >39.00)
LDL CALC: 80 mg/dL (ref 0–99)
Total CHOL/HDL Ratio: 5
Triglycerides: 207 mg/dL — ABNORMAL HIGH (ref 0.0–149.0)
VLDL: 41.4 mg/dL — ABNORMAL HIGH (ref 0.0–40.0)

## 2013-10-23 LAB — TSH: TSH: 1.54 u[IU]/mL (ref 0.35–5.50)

## 2013-10-23 MED ORDER — GLUCOSE BLOOD VI STRP: ORAL_STRIP | Status: DC

## 2013-10-23 MED ORDER — GLIPIZIDE ER 10 MG PO TB24: 10.0000 mg | ORAL_TABLET | Freq: Every day | ORAL | Status: DC

## 2013-10-23 NOTE — Progress Notes (Signed)
Subjective:    Patient ID: Anthony Rubio., male    DOB: May 01, 1951, 63 y.o.   MRN: [id number]  HPI   Here for medicare wellness  Diet: heart healthy, diabetic Physical activity: sedentary Depression/mood screen: negative Hearing: intact to whispered voice Visual acuity: grossly normal, performs annual eye exam  ADLs: capable Fall risk: reviewed Home safety: good Cognitive evaluation: intact to orientation, naming, recall and repetition EOL planning: adv directives, full code/ I agree  I have personally reviewed and have noted 1. The patient's medical and social history 2. Their use of alcohol, tobacco or illicit drugs 3. Their current medications and supplements 4. The patient's functional ability including ADL's, fall risks, home safety risks and hearing or visual impairment. 5. Diet and physical activities 6. Evidence for depression or mood disorders  Also reviewed chronic medical issues and interval medical events:  DM2 - check sugars 3-4x/week - denies symptoms of low sugar - no sweating - denies PU/PD - reports variable compliance with ongoing medical treatment and no changes in medication dose or frequency. denies adverse side effects related to current therapy.   dyslipidemia - reports compliance with ongoing medical treatment and no changes in medication dose or frequency. denies adverse side effects related to current therapy.    CAD/CHF - interval hx reviewed- reports compliance with ongoing medical treatment and no changes in medication dose or frequency. denies adverse side effects related to current therapy. no leg swelling or shortness of breath   Gait disorder - related to severe bilateral DM neuropathy - ongoing since 2007 - relates change to wearing lift off cast on left foot for wound healing - chronically poor dorsiflex L>R foot - no falls, no use of walk asst aide - s/p Nsurg eval for same 12/2009 - MRI c-spine with spondolytic changes but no severe stenosis  -   Past Medical History  Diagnosis Date  . CAD (coronary artery disease)     LHC (5/11) with 50-60% mid LAD, diffuse moderate distal LAD up to 60-70%, small OM2 subtotally  occluded, moderate to severe diffuse distal PLOM disease, severe diffuse distal RCA, PDA, and PLV  disease.  Coronaries were ectatic with slow flow.  Patient was started on Plavix given concern for thrombus  formation in the ectatic coronaries.  He stopped Plavix after starting coumadin with atrial flut  . CHF (congestive heart failure)     Systolic dysfunction, likely mixed ischemic/nonischemic.  Echo (5/11) with EF 15-20%, severe  global hypokinesis, mild MR, moderate diastolic dysfunction, mildly decreased RV systolic function.  RHC  (5/11) with mean RA 14 mmHg, PA 38/26, mean PCWP 25 mmHg, CI 1.8.  Repeat echo (8/11) with EF 15- [id number], severe diastolic dysfunction, mild MR, RV normal size and systolic function.    Marland Kitchen Uncontrolled type II diabetes mellitus   . HTN (hypertension)   . Diabetic neuropathy     severe; with chronic gait dysfunction  . Foot ulcer, left     poor healing  . Atrial flutter     s/p DCCV 9/11  . ICD (implantable cardiac defibrillator) in place 11/11    Medtronic dual cha,ber ICD with Optivol   Family History  Problem Relation Age of Onset  . Colon cancer    . Heart disease    . Stroke    . Diabetes     History  Substance Use Topics  . Smoking status: Current Some Day Smoker  . Smokeless tobacco: Not on file  Comment: about 1/4 ppd. Married, lives with wife. 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 Alcohol Use: No    Review of Systems  Constitutional: Negative for fever, activity change, appetite change, fatigue and unexpected weight change.  Respiratory: Positive for cough (am only - occ productive). Negative for chest tightness, shortness of breath, wheezing and stridor.   Cardiovascular: Negative for chest pain, palpitations and leg swelling.  Gastrointestinal:  Negative for nausea, abdominal pain and diarrhea.  Musculoskeletal: Positive for gait problem (chronic). Negative for back pain, myalgias and neck pain.  Neurological: Negative for dizziness, weakness and headaches.  Psychiatric/Behavioral: Negative for dysphoric mood. The patient is not nervous/anxious.   All other systems reviewed and are negative.      Objective:   Physical Exam  BP 130/82  Pulse 82  Temp(Src) 98.6 F (37 C) (Oral)  Ht 6\' 4"  (1.93 m)  Wt 255 lb 1.9 oz (115.722 kg)  BMI 31.07 kg/m2  SpO2 97% Wt Readings from Last 3 Encounters:  10/23/13 255 lb 1.9 oz (115.722 kg)  09/14/13 248 lb (112.492 kg)  11/10/12 259 lb 12.8 oz (117.845 kg)   Constitutional:  He appears well-developed and well-nourished. No distress. wife at side Neck: Normal range of motion. Neck supple. No JVD present. No thyromegaly present.  Cardiovascular: Normal rate, regular rhythm and normal heart sounds.  No murmur heard. no BLE edema Pulmonary/Chest: Effort normal and breath sounds normal. No respiratory distress. no wheezes.  Neurological: he is alert and oriented to person, place, and time. No cranial nerve deficit. high stepping gait due to B foot drop L>R - poor balance and proprioception - alert & oriented X3 and cranial nerves II-XII symetrically intact.  strength normal in all extremities, sensation intact to light touch. speech fluent without dysarthria or aphasia; follows commands with good comprehension.   Psychiatric: he has a normal mood and affect. behavior is normal. Judgment and thought content normal.   Lab Results  Component Value Date   WBC 10.4 06/24/2012   HGB 14.1 06/24/2012   HCT 41.7 06/24/2012   PLT 158.0 06/24/2012   GLUCOSE 166* 09/14/2013   CHOL 147 06/09/2012   TRIG 140.0 06/09/2012   HDL 28.50* 06/09/2012   LDLCALC 91 06/09/2012   ALT 31 06/24/2012   AST 22 06/24/2012   NA 143 09/14/2013   K 4.7 09/14/2013   CL 107 09/14/2013   CREATININE 1.6* 09/14/2013   BUN 29*  09/14/2013   CO2 26 09/14/2013   TSH 0.93 06/24/2012   INR 2.6 10/05/2013   HGBA1C 9.1* 06/24/2012   MICROALBUR 1.8 11/28/2009    No results found.     Assessment & Plan:   AWV/v70.0 - Today patient counseled on age appropriate routine health concerns for screening and prevention, each reviewed and up to date or declined. Immunizations reviewed and up to date or declined. Labs ordered and reviewed. Risk factors for depression reviewed and negative. Hearing function and visual acuity are intact. ADLs screened and addressed as needed. Functional ability and level of safety reviewed and appropriate. Education, counseling and referrals performed based on assessed risks today. Patient provided with a copy of personalized plan for preventive services.  Problem List Items Addressed This Visit   HYPERLIPIDEMIA     On crestor but reports cost concerns with same - Has sample supplies from [place], but no longer follows at [facility] now    Relevant Orders      Hepatic function panel  TSH   SMOKER     Ongoing tobacco abuse - reports decreased use 3 cig/ week "and not smoking the while thing" Given daily cough and concern for ?COPD, check CXr now 5 minutes today spent counseling patient on unhealthy effects of continued tobacco abuse and encouragement of cessation including medical options available to help the patient quit smoking.     Relevant Orders      Hepatic function panel      TSH      DG Chest 2 View   Type II or unspecified type diabetes mellitus with neurological manifestations, uncontrolled(250.62)      Unable to use metformin due to CKD On OSA, ?compliance with same Also rx'd ARB, statin and ASA Check a1c and add meds/titrate as needed  Lab Results  Component Value Date   HGBA1C 9.1* 06/24/2012      Relevant Medications      glipiZIDE (GLUCOTROL XL) 24 hr tablet   Other Relevant Orders      Hemoglobin A1c      Basic metabolic panel      Lipid panel       Microalbumin / creatinine urine ratio      Hepatic function panel      TSH    Other Visit Diagnoses   Routine general medical examination at a health care facility    -  Primary    Relevant Orders       Hepatic function panel       TSH    Cough        Relevant Orders       CBC with Differential       Hepatic function panel       TSH       DG Chest 2 View    Special screening for malignant neoplasms, colon        Relevant Orders       Ambulatory referral to Gastroenterology

## 2013-10-23 NOTE — Assessment & Plan Note (Signed)
On crestor but reports cost concerns with same - Has sample supplies from [place], but no longer follows at [facility] now

## 2013-10-23 NOTE — Patient Instructions (Addendum)
It was good to see you today.  We have reviewed your prior records including labs and tests today  Health Maintenance reviewed - all recommended immunizations and age-appropriate screenings are up-to-date or declined.  we'll make referral for colonoscopy screening . Our office will contact you regarding appointment(s) once made.  Test(s) ordered today -labs and chest xray. Your results will be released to [place] (or called to you) after review, usually within 72hours after test completion. If any changes need to be made, you will be notified at that same time.  Medications reviewed and updated, no changes recommended at this time. Refill on medication(s) as discussed today.  Continue to think about giving up cigarettes! Use nicotine gum, nicotine patches or electronic cigarettes. If you're interested in medication to help you quit, please call  - let me know how I can help!  Please schedule followup in 3 months for diabetes exam and labs, call sooner if problems.  Diabetes and Small Vessel Disease Small vessel disease (microvascular disease) includes nephropathy, retinopathy, and neuropathy. People with diabetes are at risk for these problems, but keeping blood glucose (sugar) controlled is helpful in preventing problems. DIABETIC KIDNEY PROBLEMS (DIABETIC NEPHROPATHY)  Diabetic nephropathy occurs in many patients with diabetes.  Damage to the small vessels in the kidneys is the leading cause of end-stage renal disease (ESRD).  Protein in the urine (albuminuria) in the range of 30 to 300 mg/24 h (microalbuminuria) is a sign of the earliest stage of diabetic nephropathy.  Good blood glucose (sugar) and blood pressure control significantly reduce the progression of nephropathy. DIABETIC EYE PROBLEMS (DIABETIC RETINOPATHY)  Diabetic retinopathy is the most common cause of new cases of blindness in adults. It is related to the number of years you have had diabetes.  Common risk factors  include high blood sugar (hyperglycemia), high blood pressure (hypertension), and poorly controlled blood lipids such as high blood cholesterol (hypercholesterolemia). DIABETIC NERVE PROBLEMS (DIABETIC NEUROPATHY) Diabetic neuropathy is the most common, long-term complication of diabetes. It is responsible for more than half of leg amputations not due to accidents. The main risk for developing diabetic neuropathy seems to be uncontrolled blood sugars. Hyperglycemia damages the nerve fibers causing sensation (feeling) problems. The closer you can keep the following guidelines, the better chance you will have avoiding problems from small vessel disease.  Working toward near normal blood glucose or as normal as possible. You will need to keep your blood glucose and A1c at the target range prescribed by your caregiver.  Keep your blood pressure less than 120/80.  Keep your low-density lipoprotein (LDL) cholesterol (one of the fats in your blood) at less than 100 mg/dL. An LDL less than 70 mg/dL may be recommended for high risk patients. You cannot change your family history, but it is important to change the risk factors that you can. Risk factors you can control include:  Controlling high blood pressure.  Stopping smoking.  Using alcohol only in moderation. Generally, this means about one drink per day for women and two drinks per day for men.  Controlling your blood lipids (cholesterol and triglycerides).  Treating heart problems, if these are contributing to risk. SEEK MEDICAL CARE IF:   You are having problems keeping your blood glucose in goal range.  You notice a change in your vision or new problems with your vision.  You have wound or sore that does not heal.  Your blood pressure is above the target range. Document Released: 06/25/2003 Document Revised: 06/08/2012 Document Reviewed:  11/30/2008 ExitCare Patient Information 2014 [place].   Health Maintenance, Males A  healthy lifestyle and preventative care can promote health and wellness.  Maintain regular health, dental, and eye exams.  Eat a healthy diet. Foods like vegetables, fruits, whole grains, low-fat dairy products, and lean protein foods contain the nutrients you need and are low in calories. Decrease your intake of foods high in solid fats, added sugars, and salt. Get information about a proper diet from your health care provider, if necessary.  Regular physical exercise is one of the most important things you can do for your health. Most adults should get at least 150 minutes of moderate-intensity exercise (any activity that increases your heart rate and causes you to sweat) each week. In addition, most adults need muscle-strengthening exercises on 2 or more days a week.   Maintain a healthy weight. The body mass index (BMI) is a screening tool to identify possible weight problems. It provides an estimate of body fat based on height and weight. Your health care provider can find your BMI and can help you achieve or maintain a healthy weight. For males 20 years and older:  A BMI below 18.5 is considered underweight.  A BMI of 18.5 to 24.9 is normal.  A BMI of 25 to 29.9 is considered overweight.  A BMI of 30 and above is considered obese.  Maintain normal blood lipids and cholesterol by exercising and minimizing your intake of saturated fat. Eat a balanced diet with plenty of fruits and vegetables. Blood tests for lipids and cholesterol should begin at age 48 and be repeated every 5 years. If your lipid or cholesterol levels are high, you are over 50, or you are at high risk for heart disease, you may need your cholesterol levels checked more frequently.Ongoing high lipid and cholesterol levels should be treated with medicines, if diet and exercise are not working.  If you smoke, find out from your health care provider how to quit. If you do not use tobacco, do not start.  Lung cancer screening  is recommended for adults aged 59 80 years who are at high risk for developing lung cancer because of a history of smoking. A yearly low-dose CT scan of the lungs is recommended for people who have at least a 30-pack-year history of smoking and are a current smoker or have quit within the past 15 years. A pack year of smoking is smoking an average of 1 pack of cigarettes a day for 1 year (for example, a 30-pack-year history of smoking could mean smoking 1 pack a day for 30 years or 2 packs a day for 15 years). Yearly screening should continue until the smoker has stopped smoking for at least 15 years. Yearly screening should be stopped for people who develop a health problem that would prevent them from having lung cancer treatment.  If you choose to drink alcohol, do not have more than 2 drinks per day. One drink is considered to be 12 oz (360 mL) of beer, 5 oz (150 mL) of wine, or 1.5 oz (45 mL) of liquor.  Avoid use of street drugs. Do not share needles with anyone. Ask for help if you need support or instructions about stopping the use of drugs.  High blood pressure causes heart disease and increases the risk of stroke. Blood pressure should be checked at least every 1 2 years. Ongoing high blood pressure should be treated with medicines if weight loss and exercise are not effective.  If you are 53 64 years old, ask your health care provider if you should take aspirin to prevent heart disease.  Diabetes screening involves taking a blood sample to check your fasting blood sugar level. This should be done once every 3 years after age 23, if you are at a normal weight and without risk factors for diabetes. Testing should be considered at a younger age or be carried out more frequently if you are overweight and have at least 1 risk factor for diabetes.  Colorectal cancer can be detected and often prevented. Most routine colorectal cancer screening begins at the age of 86 and continues through age 86.  However, your health care provider may recommend screening at an earlier age if you have risk factors for colon cancer. On a yearly basis, your health care provider may provide home test kits to check for hidden blood in the stool. A small camera at the end of a tube may be used to directly examine the colon (sigmoidoscopy or colonoscopy) to detect the earliest forms of colorectal cancer. Talk to your health care provider about this at age 50, when routine screening begins. A direct exam of the colon should be repeated every 5 10 years through age 64, unless early forms of pre-cancerous polyps or small growths are found.  People who are at an increased risk for hepatitis B should be screened for this virus. You are considered at high risk for hepatitis B if:  You were born in a country where hepatitis B occurs often. Talk with your health care provider about which countries are considered high-risk.  Your parents were born in a high-risk country and you have not received a shot to protect against hepatitis B (hepatitis B vaccine).  You have HIV or AIDS.  You use needles to inject street drugs.  You live with, or have sex with, someone who has hepatitis B.  You are a man who has sex with other men (MSM).  You get hemodialysis treatment.  You take certain medicines for conditions like cancer, organ transplantation, and autoimmune conditions.  Hepatitis C blood testing is recommended for all people born from [birth year] through 1966 and any individual with known risk factors for hepatitis C.  Healthy men should no longer receive prostate-specific antigen (PSA) blood tests as part of routine cancer screening. Talk to your health care provider about prostate cancer screening.  Testicular cancer screening is not recommended for adolescents or adult males who have no symptoms. Screening includes self-exam, a health care provider exam, and other screening tests. Consult with your health care provider about  any symptoms you have or any concerns you have about testicular cancer.  Practice safe sex. Use condoms and avoid high-risk sexual practices to reduce the spread of sexually transmitted infections (STIs).  Use sunscreen. Apply sunscreen liberally and repeatedly throughout the day. You should seek shade when your shadow is shorter than you. Protect yourself by wearing long sleeves, pants, a wide-brimmed hat, and sunglasses year round, whenever you are outdoors.  Tell your health care provider of new moles or changes in moles, especially if there is a change in shape or color. Also tell your provider if a mole is larger than the size of a pencil eraser.  A one-time screening for abdominal aortic aneurysm (AAA) and surgical repair of large AAAs by ultrasound is recommended for men aged 4 75 years who are current or former smokers.  Stay current with your vaccines (immunizations). Document Released: 12/19/2007 Document Revised:  04/12/2013 Document Reviewed: 11/17/2010 ExitCare Patient Information 2014 [place].

## 2013-10-23 NOTE — Assessment & Plan Note (Signed)
Ongoing tobacco abuse - reports decreased use 3 cig/ week "and not smoking the while thing" Given daily cough and concern for ?COPD, check CXr now 5 minutes today spent counseling patient on unhealthy effects of continued tobacco abuse and encouragement of cessation including medical options available to help the patient quit smoking.

## 2013-10-23 NOTE — Assessment & Plan Note (Signed)
Unable to use metformin due to CKD On OSA, ?compliance with same Also rx'd ARB, statin and ASA Check a1c and add meds/titrate as needed  Lab Results  Component Value Date   HGBA1C 9.1* 06/24/2012

## 2013-10-24 ENCOUNTER — Other Ambulatory Visit: Payer: Medicare Other

## 2013-10-24 ENCOUNTER — Telehealth: Payer: Self-pay | Admitting: Internal Medicine

## 2013-10-24 LAB — MICROALBUMIN / CREATININE URINE RATIO
CREATININE, U: 88.8 mg/dL
Microalb Creat Ratio: 1.4 mg/g (ref 0.0–30.0)
Microalb, Ur: 1.2 mg/dL (ref 0.0–1.9)

## 2013-10-24 NOTE — Telephone Encounter (Signed)
Relevant patient education assigned to patient using Emmi. ° °

## 2013-10-25 ENCOUNTER — Other Ambulatory Visit: Payer: Self-pay | Admitting: Internal Medicine

## 2013-10-25 MED ORDER — SITAGLIPTIN PHOSPHATE 50 MG PO TABS: 50.0000 mg | ORAL_TABLET | Freq: Every day | ORAL | Status: DC

## 2013-10-31 ENCOUNTER — Other Ambulatory Visit: Payer: Self-pay | Admitting: *Deleted

## 2013-10-31 MED ORDER — SPIRONOLACTONE 25 MG PO TABS: ORAL_TABLET | ORAL | Status: DC

## 2013-11-01 ENCOUNTER — Other Ambulatory Visit: Payer: Self-pay

## 2013-11-01 MED ORDER — FUROSEMIDE 40 MG PO TABS: ORAL_TABLET | ORAL | Status: DC

## 2013-11-02 ENCOUNTER — Ambulatory Visit (INDEPENDENT_AMBULATORY_CARE_PROVIDER_SITE_OTHER): Payer: Medicare Other

## 2013-11-02 DIAGNOSIS — Z5181 Encounter for therapeutic drug level monitoring: Secondary | ICD-10-CM | POA: Diagnosis not present

## 2013-11-02 DIAGNOSIS — I4892 Unspecified atrial flutter: Secondary | ICD-10-CM

## 2013-11-02 LAB — POCT INR: INR: 2.5

## 2013-11-21 ENCOUNTER — Other Ambulatory Visit: Payer: Self-pay | Admitting: *Deleted

## 2013-11-21 MED ORDER — WARFARIN SODIUM 5 MG PO TABS
ORAL_TABLET | ORAL | Status: DC
Start: 2013-11-21 — End: 2014-06-04

## 2013-11-22 ENCOUNTER — Encounter: Payer: Self-pay | Admitting: Internal Medicine

## 2013-11-23 ENCOUNTER — Other Ambulatory Visit: Payer: Self-pay

## 2013-11-23 MED ORDER — ISOSORBIDE DINITRATE 30 MG PO TABS: ORAL_TABLET | ORAL | Status: DC

## 2013-11-29 ENCOUNTER — Telehealth: Payer: Self-pay

## 2013-11-29 NOTE — Telephone Encounter (Signed)
Error

## 2013-11-30 ENCOUNTER — Ambulatory Visit (INDEPENDENT_AMBULATORY_CARE_PROVIDER_SITE_OTHER): Payer: Medicare Other | Admitting: *Deleted

## 2013-11-30 DIAGNOSIS — I4892 Unspecified atrial flutter: Secondary | ICD-10-CM | POA: Diagnosis not present

## 2013-11-30 DIAGNOSIS — Z5181 Encounter for therapeutic drug level monitoring: Secondary | ICD-10-CM

## 2013-11-30 LAB — POCT INR: INR: 1.7

## 2013-12-27 ENCOUNTER — Other Ambulatory Visit: Payer: Self-pay | Admitting: Cardiology

## 2013-12-28 ENCOUNTER — Ambulatory Visit (INDEPENDENT_AMBULATORY_CARE_PROVIDER_SITE_OTHER): Payer: Medicare Other | Admitting: Pharmacist Clinician (PhC)/ Clinical Pharmacy Specialist

## 2013-12-28 DIAGNOSIS — Z5181 Encounter for therapeutic drug level monitoring: Secondary | ICD-10-CM

## 2013-12-28 DIAGNOSIS — I4892 Unspecified atrial flutter: Secondary | ICD-10-CM

## 2013-12-28 LAB — POCT INR: INR: 2.5

## 2014-01-18 ENCOUNTER — Telehealth: Payer: Self-pay

## 2014-01-18 NOTE — Telephone Encounter (Signed)
VM was full and could accept new messages. Sent call back number to pager.   When pt calls back he needs to reschedule his CPE with PCP

## 2014-01-22 ENCOUNTER — Ambulatory Visit (INDEPENDENT_AMBULATORY_CARE_PROVIDER_SITE_OTHER): Payer: Medicare Other

## 2014-01-22 DIAGNOSIS — I4892 Unspecified atrial flutter: Secondary | ICD-10-CM | POA: Diagnosis not present

## 2014-01-22 DIAGNOSIS — Z5181 Encounter for therapeutic drug level monitoring: Secondary | ICD-10-CM | POA: Diagnosis not present

## 2014-01-22 LAB — POCT INR: INR: 4.5

## 2014-01-25 ENCOUNTER — Ambulatory Visit: Payer: Medicare Other | Admitting: Internal Medicine

## 2014-01-30 ENCOUNTER — Other Ambulatory Visit: Payer: Self-pay | Admitting: Cardiology

## 2014-02-07 ENCOUNTER — Other Ambulatory Visit: Payer: Self-pay | Admitting: Cardiology

## 2014-02-16 ENCOUNTER — Other Ambulatory Visit: Payer: Self-pay | Admitting: *Deleted

## 2014-02-16 MED ORDER — FUROSEMIDE 40 MG PO TABS: ORAL_TABLET | ORAL | Status: DC

## 2014-02-22 ENCOUNTER — Ambulatory Visit (INDEPENDENT_AMBULATORY_CARE_PROVIDER_SITE_OTHER): Payer: Medicare Other | Admitting: *Deleted

## 2014-02-22 DIAGNOSIS — I4892 Unspecified atrial flutter: Secondary | ICD-10-CM

## 2014-02-22 DIAGNOSIS — Z5181 Encounter for therapeutic drug level monitoring: Secondary | ICD-10-CM

## 2014-02-22 LAB — POCT INR: INR: 4.2

## 2014-02-28 ENCOUNTER — Other Ambulatory Visit: Payer: Self-pay | Admitting: Internal Medicine

## 2014-03-01 ENCOUNTER — Other Ambulatory Visit: Payer: Self-pay

## 2014-03-01 MED ORDER — HYDRALAZINE HCL 50 MG PO TABS: ORAL_TABLET | ORAL | Status: DC

## 2014-03-07 ENCOUNTER — Ambulatory Visit (INDEPENDENT_AMBULATORY_CARE_PROVIDER_SITE_OTHER): Payer: Medicare Other | Admitting: *Deleted

## 2014-03-07 DIAGNOSIS — I4892 Unspecified atrial flutter: Secondary | ICD-10-CM | POA: Diagnosis not present

## 2014-03-07 DIAGNOSIS — Z5181 Encounter for therapeutic drug level monitoring: Secondary | ICD-10-CM

## 2014-03-07 LAB — POCT INR: INR: 2.4

## 2014-03-14 ENCOUNTER — Other Ambulatory Visit: Payer: Self-pay | Admitting: Cardiology

## 2014-03-26 ENCOUNTER — Ambulatory Visit (INDEPENDENT_AMBULATORY_CARE_PROVIDER_SITE_OTHER): Payer: Medicare Other | Admitting: *Deleted

## 2014-03-26 DIAGNOSIS — I4892 Unspecified atrial flutter: Secondary | ICD-10-CM

## 2014-03-26 DIAGNOSIS — Z5181 Encounter for therapeutic drug level monitoring: Secondary | ICD-10-CM | POA: Diagnosis not present

## 2014-03-26 LAB — POCT INR: INR: 3.5

## 2014-04-19 ENCOUNTER — Ambulatory Visit: Payer: Medicare Other | Admitting: Cardiology

## 2014-05-02 ENCOUNTER — Other Ambulatory Visit: Payer: Self-pay | Admitting: Cardiology

## 2014-05-03 ENCOUNTER — Ambulatory Visit (INDEPENDENT_AMBULATORY_CARE_PROVIDER_SITE_OTHER): Payer: Medicare Other | Admitting: Physician Assistant

## 2014-05-03 ENCOUNTER — Ambulatory Visit (INDEPENDENT_AMBULATORY_CARE_PROVIDER_SITE_OTHER): Payer: Medicare Other

## 2014-05-03 ENCOUNTER — Ambulatory Visit (INDEPENDENT_AMBULATORY_CARE_PROVIDER_SITE_OTHER): Payer: Medicare Other | Admitting: *Deleted

## 2014-05-03 ENCOUNTER — Encounter: Payer: Self-pay | Admitting: Internal Medicine

## 2014-05-03 ENCOUNTER — Encounter: Payer: Self-pay | Admitting: Physician Assistant

## 2014-05-03 VITALS — BP 118/80 | HR 72 | Ht 75.5 in | Wt 253.8 lb

## 2014-05-03 DIAGNOSIS — I5022 Chronic systolic (congestive) heart failure: Secondary | ICD-10-CM | POA: Diagnosis not present

## 2014-05-03 DIAGNOSIS — Z5181 Encounter for therapeutic drug level monitoring: Secondary | ICD-10-CM | POA: Diagnosis not present

## 2014-05-03 DIAGNOSIS — R42 Dizziness and giddiness: Secondary | ICD-10-CM | POA: Diagnosis not present

## 2014-05-03 DIAGNOSIS — E1140 Type 2 diabetes mellitus with diabetic neuropathy, unspecified: Secondary | ICD-10-CM

## 2014-05-03 DIAGNOSIS — I10 Essential (primary) hypertension: Secondary | ICD-10-CM | POA: Diagnosis not present

## 2014-05-03 DIAGNOSIS — I4892 Unspecified atrial flutter: Secondary | ICD-10-CM

## 2014-05-03 DIAGNOSIS — Z23 Encounter for immunization: Secondary | ICD-10-CM | POA: Diagnosis not present

## 2014-05-03 DIAGNOSIS — N183 Chronic kidney disease, stage 3 unspecified: Secondary | ICD-10-CM

## 2014-05-03 DIAGNOSIS — I255 Ischemic cardiomyopathy: Secondary | ICD-10-CM | POA: Diagnosis not present

## 2014-05-03 DIAGNOSIS — I2510 Atherosclerotic heart disease of native coronary artery without angina pectoris: Secondary | ICD-10-CM | POA: Diagnosis not present

## 2014-05-03 DIAGNOSIS — I350 Nonrheumatic aortic (valve) stenosis: Secondary | ICD-10-CM | POA: Insufficient documentation

## 2014-05-03 LAB — CBC
HEMATOCRIT: 39.3 % (ref 39.0–52.0)
Hemoglobin: 12.5 g/dL — ABNORMAL LOW (ref 13.0–17.0)
MCHC: 31.8 g/dL (ref 30.0–36.0)
MCV: 83.2 fl (ref 78.0–100.0)
Platelets: 178 10*3/uL (ref 150.0–400.0)
RBC: 4.72 Mil/uL (ref 4.22–5.81)
RDW: 17 % — AB (ref 11.5–15.5)
WBC: 6.9 10*3/uL (ref 4.0–10.5)

## 2014-05-03 LAB — COMPREHENSIVE METABOLIC PANEL
ALK PHOS: 61 U/L (ref 39–117)
ALT: 19 U/L (ref 0–53)
AST: 17 U/L (ref 0–37)
Albumin: 3.3 g/dL — ABNORMAL LOW (ref 3.5–5.2)
BUN: 27 mg/dL — ABNORMAL HIGH (ref 6–23)
CO2: 18 mEq/L — ABNORMAL LOW (ref 19–32)
Calcium: 9 mg/dL (ref 8.4–10.5)
Chloride: 110 mEq/L (ref 96–112)
Creatinine, Ser: 1.4 mg/dL (ref 0.4–1.5)
GFR: 63.9 mL/min (ref >60.00)
GLUCOSE: 143 mg/dL — AB (ref 70–99)
Potassium: 4.6 mEq/L (ref 3.5–5.1)
SODIUM: 139 meq/L (ref 135–145)
TOTAL PROTEIN: 6.9 g/dL (ref 6.0–8.3)
Total Bilirubin: 0.8 mg/dL (ref 0.2–1.2)

## 2014-05-03 LAB — LIPID PANEL
CHOL/HDL RATIO: 5
Cholesterol: 132 mg/dL (ref 0–200)
HDL: 27.4 mg/dL — ABNORMAL LOW (ref >39.00)
LDL CALC: 89 mg/dL (ref 0–99)
NONHDL: 104.6
Triglycerides: 78 mg/dL (ref 0.0–149.0)
VLDL: 15.6 mg/dL (ref 0.0–40.0)

## 2014-05-03 LAB — POCT INR: INR: 1.9

## 2014-05-03 MED ORDER — ISOSORBIDE MONONITRATE ER 30 MG PO TB24: 30.0000 mg | ORAL_TABLET | Freq: Every day | ORAL | Status: DC

## 2014-05-03 NOTE — Progress Notes (Addendum)
[address], [address] [place], [place]  [address] Phone: [phone] Fax:  [phone]  Date:  05/03/2014   Patient ID:  Anthony Rubio., DOB 1951/02/09, MRN [id number]   PCP:  Gwendolyn Grant, MD  Cardiologist:  Aundra Dubin EP:  Allred   History of Present Illness: Anthony Rubio. is a 63 y.o. male with history of CAD in 2011 as outlined below, mixed ischemic/nonischemic cardiomyopathy, chronic systolic CHF s/p ICD [id number], HTN, diabetes c/b neuropathy, atrial flutter s/p DCCV 03/2010 on Coumadin who presents for followup. He initially was hospitalized in 5/11 with acute decompensated CHF after several days of shortness of breath, orthopnea, and PND. He was diuresed and left/right heart catheterization was done. Left heart cath showed large, ectatic coronaries with slow flow and moderate to severe diffuse distal vessel disease (consistent with diabetes) without good interventional options. He was initially put on Plavix. EF was 15-20% by echo. He was put on milrinone and further diuresed for several more days. He was titrated off milrinone and discharged on an oral medication regimen. He later developed atrial flutter in 03/2010 requiring cardioversion - he was taken off Plavix and put on Coumadin. Last echo in 10/12 showed that EF remains 25% with mild AS.  He comes in doing well. No significant CHF symptoms. No chest pain or dyspnea. He stopped lisinopril on his own about 3 months ago due to lightheadedness after standing after taking this. He has not had recurrence of this symptom. No ICD discharges or syncope. He said he is now taking hydralazine and isosorbide only once a day due to cost.   Recent Labs: 10/23/2013: ALT 33; Creatinine 1.7*; HDL Cholesterol by NMR 28.20*; Hemoglobin 14.6; LDL (calc) 80; Potassium 4.6; TSH 1.54   Wt Readings from Last 3 Encounters:  05/03/14 253 lb 12.8 oz (115.123 kg)  10/23/13 255 lb 1.9 oz (115.722 kg)  09/14/13 248 lb (112.492 kg)     Past Medical  History  Diagnosis Date  . CAD (coronary artery disease)     a. LHC (5/11) with 50-60% mid LAD, diffuse mod distal LAD up to 60-70%, small OM2 subtotally occ, mod-severe diffuse distal PLOM, severe diffuse distal RCA, PDA, and PLV  disease. Started on Plavix given concern for thrombus formation in the ectatic cors with slow flow. b. Stopped Plavix after starting Coumadin for a-flutter.  . CHF (congestive heart failure)     a. Likely mixed ICM/NICM. Echo (5/11) with EF 15-20%, severe global HK, mild MR, mod d/d, mildly decreased RV fcn. RHC (5/11) with mean RA 14 mmHg, PA 38/26, mean PCWP 25 mmHg, CI 1.8.  b. TEE (9/11): EF 15% no LV thrombus, no LAA thrombus. c. s/p ICD 11/11. d. Last echo 04/2011: LV severely dilated, EF 25%, diffuse HK, mild AS, mildly dilated LA, trivial pericardial effusion.  Marland Kitchen Uncontrolled type II diabetes mellitus   . HTN (hypertension)   . Diabetic neuropathy     severe; with chronic gait dysfunction  . Foot ulcer, left     poor healing  . Atrial flutter     s/p DCCV 9/11  . ICD (implantable cardiac defibrillator) in place 11/11    Medtronic dual cha,ber ICD with Optivol  . Aortic stenosis     a. Mild by echo 04/2011.   . Tobacco abuse   . CKD (chronic kidney disease), stage III     Current Outpatient Prescriptions  Medication Sig Dispense Refill  . B Complex Vitamins (B COMPLEX 100  PO) Take 1 tablet by mouth daily as needed.        . carvedilol (COREG) 12.5 MG tablet Take one and one-half by mouth twice a day  90 tablet  6  . fish oil-omega-3 fatty acids 1000 MG capsule Take 2 g by mouth as needed.   60 capsule  11  . furosemide (LASIX) 40 MG tablet TAKE ONE-HALF TABLET BY MOUTH ONCE DAILY  15 tablet  1  . glipiZIDE (GLUCOTROL XL) 10 MG 24 hr tablet Take 1 tablet (10 mg total) by mouth daily.  30 tablet  11  . glucose blood (RELION PRIME TEST) test strip Use to check blood sugars twice a day Dx 250.62  100 each  11  . hydrALAZINE (APRESOLINE) 50 MG tablet TAKE  ONE TABLET BY MOUTH THREE TIMES DAILY - patient only taking once daily  270 tablet  3  . isosorbide dinitrate (ISORDIL) 30 MG tablet TAKE ONE TABLET BY MOUTH DAILY  90 tablet  1  . RELION PRIME TEST test strip USE TO CHECK BLOOD SUGAR TWICE A DAY.  100 each  11  . rosuvastatin (CRESTOR) 40 MG tablet Take 40 mg by mouth daily.        . sitaGLIPtin (JANUVIA) 50 MG tablet Take 1 tablet (50 mg total) by mouth daily.  90 tablet  3  . spironolactone (ALDACTONE) 25 MG tablet TAKE ONE TABLET BY MOUTH ONCE DAILY *NEEDS  APPOINTMENT*  30 tablet  0  . warfarin (COUMADIN) 5 MG tablet 2 tablets daily except 1.5 tablets on Mondays, Wednesdays, and Fridays or as directed by coumadin clinic  60 tablet  3   No current facility-administered medications for this visit.    Allergies:   Erythromycin and Lisinopril   Social History:  The patient  reports that he has been smoking.  He does not have any smokeless tobacco history on file. He reports that he does not drink alcohol.   Family History:  The patient's family history includes Colon cancer in an other family member; Diabetes in an other family member; Heart disease in an other family member; Stroke in an other family member.   ROS:  Please see the history of present illness.   All other systems reviewed and negative.   PHYSICAL EXAM: VS:  BP 118/80  Pulse 72  Ht 6' 3.5" (1.918 m)  Wt 253 lb 12.8 oz (115.123 kg)  BMI 31.29 kg/m2 Well nourished, well developed AAM, in no acute distress HEENT: normal Neck: no JVD Cardiac:  normal S1, S2; RRR; no murmur Lungs:  clear to auscultation bilaterally, no wheezing, rhonchi or rales Abd: soft, nontender, no hepatomegaly Ext: no edema Skin: warm and dry Neuro:  moves all extremities spontaneously, no focal abnormalities noted  EKG:  NSR 72bpm, occasional PACs and PVCs, left axis devation, LBBB pattern (QRS 128 - previously had IVCD with similar morphology), prior inferior infarct and prior anterolateral  infarct, T wave flattening I, TWI aVL - no significant change from prior  ASSESSMENT AND PLAN:  1. Chronic systolic CHF s/p ICD 0000000 - he is doing well symptom-wise. He did self-discontinue his lisinopril 3 months ago due to dizziness/orthostatic-type symptoms and has felt better off of this. I do think he needs to be on an ACEI, however, with this chronic systolic dysfunction. Further complicating this is the fact that he is not taking his hydralazine and isordil as prescribed. Imdur is on the $4 list thus will switch him from Isordil 30mg  daily to  Imdur 30mg  daily. Will check BMET and CBC today. If stable, I will discuss the plan for his readdition of ACEI/titration of hydralazine with Dr. Aundra Dubin. We might need to back down on the hydralazine altogether to allow BP room for ACEI. No recent ICD discharges. Will update 2D echocardiogram since it's been 3 years. 2. CAD - no recent angina. Continue BB, statin. 3. HTN - controlled. Suspected orthostasis on lisinopril - see above. 4. Hyperlipidemia - continue Crestor. Check lipids/LFTs. 5. Aortic stenosis - reassess with updated echo. 6. CKD stage III - check BMET. Suspected due to DM.   Dispo: F/u 3 months with Dr. Aundra Dubin. I have also asked nursing to touch base with device clinic to find out when his next check is due since he does not have one scheduled   Signed, Melina Copa, PA-C  05/03/2014 9:53 AM

## 2014-05-03 NOTE — Patient Instructions (Addendum)
Your physician has recommended you make the following change in your medication:   START IMDUR 30 [place]    Your physician recommends that you return for lab work in: [place] has requested that you have an echocardiogram. Echocardiography is a painless test that uses sound waves to create images of your heart. It provides your doctor with information about the size and shape of your heart and how well your heart's chambers and valves are working. This procedure takes approximately one hour. There are no restrictions for this procedure.    Your physician recommends that you schedule a follow-up appointment in:  IN [place]    E/P FOLLOW RECALL IN February  FOR REMOTE CHECK CAN IT BE MOVED UP SOONER SINCE DEVICE HASNT BEEN CHECKED SINCE MARCH ?

## 2014-05-04 LAB — MDC_IDC_ENUM_SESS_TYPE_INCLINIC
Battery Voltage: 3.07 V
Brady Statistic AP VP Percent: 0.17 %
Brady Statistic AS VS Percent: 63.32 %
Brady Statistic RA Percent Paced: 36.61 %
Date Time Interrogation Session: 20151029135220
HIGH POWER IMPEDANCE MEASURED VALUE: 40 Ohm
HIGH POWER IMPEDANCE MEASURED VALUE: 50 Ohm
HighPow Impedance: 171 Ohm
HighPow Impedance: 266 Ohm
Lead Channel Impedance Value: 342 Ohm
Lead Channel Impedance Value: 437 Ohm
Lead Channel Pacing Threshold Amplitude: 0.625 V
Lead Channel Pacing Threshold Amplitude: 0.75 V
Lead Channel Pacing Threshold Pulse Width: 0.4 ms
Lead Channel Sensing Intrinsic Amplitude: 6.875 mV
Lead Channel Sensing Intrinsic Amplitude: 7.375 mV
Lead Channel Setting Pacing Amplitude: 2 V
Lead Channel Setting Pacing Amplitude: 2.5 V
Lead Channel Setting Sensing Sensitivity: 0.3 mV
MDC IDC MSMT LEADCHNL RA SENSING INTR AMPL: 2.5 mV
MDC IDC MSMT LEADCHNL RA SENSING INTR AMPL: 2.625 mV
MDC IDC MSMT LEADCHNL RV PACING THRESHOLD PULSEWIDTH: 0.4 ms
MDC IDC SET LEADCHNL RV PACING PULSEWIDTH: 0.4 ms
MDC IDC SET ZONE DETECTION INTERVAL: 300 ms
MDC IDC SET ZONE DETECTION INTERVAL: 390 ms
MDC IDC STAT BRADY AP VS PERCENT: 36.44 %
MDC IDC STAT BRADY AS VP PERCENT: 0.07 %
MDC IDC STAT BRADY RV PERCENT PACED: 0.24 %
Zone Setting Detection Interval: 340 ms
Zone Setting Detection Interval: 400 ms

## 2014-05-04 NOTE — Progress Notes (Signed)
ICD check in clinic. Normal device function. Thresholds and sensing consistent with previous device measurements. Impedance trends stable over time. No evidence of any ventricular arrhythmias. Abn thoracic impedance from 9/25-10/18. Histogram distribution appropriate for patient and level of activity. No changes made this session. Device programmed at appropriate safety margins. Device programmed to optimize intrinsic conduction. Batt voltage 3.07V (ERI 2.63V). Plan to follow up with the [facility] on 1/28 @ 9:00am. Patient education completed including shock plan. Alert tones demonstrated for patient.

## 2014-05-06 NOTE — Progress Notes (Signed)
Have him followup with me at CHF clinic.

## 2014-05-09 ENCOUNTER — Other Ambulatory Visit (INDEPENDENT_AMBULATORY_CARE_PROVIDER_SITE_OTHER): Payer: Medicare Other

## 2014-05-09 ENCOUNTER — Ambulatory Visit (INDEPENDENT_AMBULATORY_CARE_PROVIDER_SITE_OTHER): Payer: Medicare Other | Admitting: Internal Medicine

## 2014-05-09 ENCOUNTER — Encounter: Payer: Self-pay | Admitting: Internal Medicine

## 2014-05-09 ENCOUNTER — Telehealth: Payer: Self-pay | Admitting: *Deleted

## 2014-05-09 VITALS — BP 110/72 | HR 69 | Temp 98.4°F | Ht 75.5 in | Wt 252.0 lb

## 2014-05-09 DIAGNOSIS — I10 Essential (primary) hypertension: Secondary | ICD-10-CM

## 2014-05-09 DIAGNOSIS — I5022 Chronic systolic (congestive) heart failure: Secondary | ICD-10-CM

## 2014-05-09 DIAGNOSIS — IMO0002 Reserved for concepts with insufficient information to code with codable children: Secondary | ICD-10-CM

## 2014-05-09 DIAGNOSIS — Z1211 Encounter for screening for malignant neoplasm of colon: Secondary | ICD-10-CM | POA: Diagnosis not present

## 2014-05-09 DIAGNOSIS — E1140 Type 2 diabetes mellitus with diabetic neuropathy, unspecified: Secondary | ICD-10-CM

## 2014-05-09 DIAGNOSIS — E1165 Type 2 diabetes mellitus with hyperglycemia: Secondary | ICD-10-CM

## 2014-05-09 LAB — HEMOGLOBIN A1C: HEMOGLOBIN A1C: 7.4 % — AB (ref 4.6–6.5)

## 2014-05-09 MED ORDER — LISINOPRIL 2.5 MG PO TABS: 2.5000 mg | ORAL_TABLET | Freq: Every day | ORAL | Status: DC

## 2014-05-09 NOTE — Patient Instructions (Addendum)
It was good to see you today.  We have reviewed your prior records including labs and tests today  Test(s) ordered today. Your results will be released to [facility] (or called to you) after review, usually within 72hours after test completion. If any changes need to be made, you will be notified at that same time.  Medications reviewed and updated, no changes recommended at this time. Refill on medication(s) as discussed today.  we'll make referral to eye specialist for diabetes mellitus check of eyes. Our office will contact you regarding appointment(s) once made.  We'll have you screened for colon cancer with COLO[facility]UR[facility] (stool DNA testing) - this packet will be sent to your home by the testing company. Complete instructions as in the box, and we will contact you with the results once available.  Please schedule followup in 6 months, call sooner if problems.  Diabetes and Standards of Medical Care Diabetes is complicated. You may find that your diabetes team includes a dietitian, nurse, diabetes educator, eye doctor, and more. To help everyone know what is going on and to help you get the care you deserve, the following schedule of care was developed to help keep you on track. Below are the tests, exams, vaccines, medicines, education, and plans you will need. HbA1c test This test shows how well you have controlled your glucose over the past 2-3 months. It is used to see if your diabetes management plan needs to be adjusted.   It is performed at least 2 times a year if you are meeting treatment goals.  It is performed 4 times a year if therapy has changed or if you are not meeting treatment goals. Blood pressure test  This test is performed at every routine medical visit. The goal is less than 140/90 mm Hg for most people, but 130/80 mm Hg in some cases. Ask your health care provider about your goal. Dental exam  Follow up with the dentist regularly. Eye exam  If you are diagnosed with  type 1 diabetes as a child, get an exam upon reaching the age of 30 years or older and have had diabetes for 3-5 years. Yearly eye exams are recommended after that initial eye exam.  If you are diagnosed with type 1 diabetes as an adult, get an exam within 5 years of diagnosis and then yearly.  If you are diagnosed with type 2 diabetes, get an exam as soon as possible after the diagnosis and then yearly. Foot care exam  Visual foot exams are performed at every routine medical visit. The exams check for cuts, injuries, or other problems with the feet.  A comprehensive foot exam should be done yearly. This includes visual inspection as well as assessing foot pulses and testing for loss of sensation.  Check your feet nightly for cuts, injuries, or other problems with your feet. Tell your health care provider if anything is not healing. Kidney function test (urine microalbumin)  This test is performed once a year.  Type 1 diabetes: The first test is performed 5 years after diagnosis.  Type 2 diabetes: The first test is performed at the time of diagnosis.  A serum creatinine and estimated glomerular filtration rate (eGFR) test is done once a year to assess the level of chronic kidney disease (CKD), if present. Lipid profile (cholesterol, HDL, LDL, triglycerides)  Performed every 5 years for most people.  The goal for LDL is less than 100 mg/dL. If you are at high risk, the goal is less  than 70 mg/dL.  The goal for HDL is 40 mg/dL-50 mg/dL for men and 50 mg/dL-60 mg/dL for women. An HDL cholesterol of 60 mg/dL or higher gives some protection against heart disease.  The goal for triglycerides is less than 150 mg/dL. Influenza vaccine, pneumococcal vaccine, and hepatitis B vaccine  The influenza vaccine is recommended yearly.  It is recommended that people with diabetes who are over 53 years old get the pneumonia vaccine. In some cases, two separate shots may be given. Ask your health care  provider if your pneumonia vaccination is up to date.  The hepatitis B vaccine is also recommended for adults with diabetes. Diabetes self-management education  Education is recommended at diagnosis and ongoing as needed. Treatment plan  Your treatment plan is reviewed at every medical visit. Document Released: 04/19/2009 Document Revised: 11/06/2013 Document Reviewed: 11/22/2012 [facility] Patient Information 2015 [place], [place]. This information is not intended to replace advice given to you by your health care provider. Make sure you discuss any questions you have with your health care provider.

## 2014-05-09 NOTE — Assessment & Plan Note (Signed)
euvolemic today -  follows with cards regularly - last OV and med changes reviewed and reinforced today The current medical regimen is effective;  continue present plan and medications.

## 2014-05-09 NOTE — Progress Notes (Signed)
Pre visit review using our clinic review tool, if applicable. No additional management support is needed unless otherwise documented below in the visit note. 

## 2014-05-09 NOTE — Telephone Encounter (Signed)
pt had appt today Dr. Asa Lente who he states went over the lab instrcutions and med changes. Pt states he does not want to be see at the CHF. He states what is the difference if Dr. Aundra Dubin sees him in [facility] st or HF. States comfortable w/our office. I explained to the pt reasons why to be followed with CHF clinic, but pt states he does not understand the difference if Dr. Aundra Dubin is treating him here for his CHF then why go to another place as well. I told pt that I will let Dr. Aundra Dubin know that he does not want to go to the CHF clinic and just to still see Dr. Aundra Dubin at [facility] office. I explained to pt that that does not mean that Dr. Aundra Dubin won't still suggest CHF clinic, Pt said ok and thank you.

## 2014-05-09 NOTE — Progress Notes (Signed)
Subjective:    Patient ID: Anthony Rubio., male    DOB: 11/05/1950, 63 y.o.   MRN: [id number]  HPI  Patient here for follow up Reviewed chronic medical issues and interval medical events  Past Medical History  Diagnosis Date  . CAD (coronary artery disease)     a. LHC (5/11) with 50-60% mid LAD, diffuse mod distal LAD up to 60-70%, small OM2 subtotally occ, mod-severe diffuse distal PLOM, severe diffuse distal RCA, PDA, and PLV  disease. Started on Plavix given concern for thrombus formation in the ectatic cors with slow flow. b. Stopped Plavix after starting Coumadin for a-flutter.  . CHF (congestive heart failure)     a. Likely mixed ICM/NICM. Echo (5/11) with EF 15-20%, severe global HK, mild MR, mod d/d, mildly decreased RV fcn. RHC (5/11) with mean RA 14 mmHg, PA 38/26, mean PCWP 25 mmHg, CI 1.8.  b. TEE (9/11): EF 15% no LV thrombus, no LAA thrombus. c. s/p ICD 11/11. d. Last echo 04/2011: LV severely dilated, EF 25%, diffuse HK, mild AS, mildly dilated LA, trivial pericardial effusion.  Marland Kitchen Uncontrolled type II diabetes mellitus   . HTN (hypertension)   . Diabetic neuropathy     severe; with chronic gait dysfunction  . Foot ulcer, left     poor healing  . Atrial flutter     s/p DCCV 9/11  . ICD (implantable cardiac defibrillator) in place 11/11    Medtronic dual cha,ber ICD with Optivol  . Aortic stenosis     a. Mild by echo 04/2011.   . Tobacco abuse   . CKD (chronic kidney disease), stage III     Review of Systems  Constitutional: Positive for fatigue. Negative for fever and unexpected weight change.  Respiratory: Negative for cough and shortness of breath.   Cardiovascular: Negative for chest pain and leg swelling.       Objective:   Physical Exam  BP 110/72 mmHg  Pulse 69  Temp(Src) 98.4 F (36.9 C) (Oral)  Ht 6' 3.5" (1.918 m)  Wt 252 lb (114.306 kg)  BMI 31.07 kg/m2  SpO2 99% Wt Readings from Last 3 Encounters:  05/09/14 252 lb (114.306 kg)  05/03/14  253 lb 12.8 oz (115.123 kg)  10/23/13 255 lb 1.9 oz (115.722 kg)   Constitutional: he is obese, appears well-developed and well-nourished. No distress. wife at side Neck: Normal range of motion. Neck supple. No JVD present. No thyromegaly present.  Cardiovascular: Normal rate, regular rhythm and normal heart sounds.  No murmur heard. No BLE edema. Pulmonary/Chest: Effort normal and breath sounds normal. No respiratory distress. he has no wheezes.  Psychiatric: he has a normal mood and affect. His behavior is normal. Judgment and thought content normal.   Lab Results  Component Value Date   WBC 6.9 05/03/2014   HGB 12.5* 05/03/2014   HCT 39.3 05/03/2014   PLT 178.0 05/03/2014   GLUCOSE 143* 05/03/2014   CHOL 132 05/03/2014   TRIG 78.0 05/03/2014   HDL 27.40* 05/03/2014   LDLCALC 89 05/03/2014   ALT 19 05/03/2014   AST 17 05/03/2014   NA 139 05/03/2014   K 4.6 05/03/2014   CL 110 05/03/2014   CREATININE 1.4 05/03/2014   BUN 27* 05/03/2014   CO2 18* 05/03/2014   TSH 1.54 10/23/2013   INR 1.9 05/03/2014   HGBA1C 9.1* 10/23/2013   MICROALBUR 1.2 10/23/2013    Dg Chest 2 View  10/23/2013   CLINICAL DATA:  Productive cough  EXAM: CHEST  2 VIEW  COMPARISON:  DG CHEST 2 VIEW dated 05/14/2010  FINDINGS: There is no focal parenchymal opacity, pleural effusion, or pneumothorax. The heart and mediastinal contours are unremarkable. There is a due lead cardiac pacer.  The osseous structures are unremarkable.  IMPRESSION: No active cardiopulmonary disease.   Electronically Signed   By: Kathreen Devoid   On: 10/23/2013 12:32       Assessment & Plan:   Problem List Items Addressed This Visit    Chronic systolic heart failure    euvolemic today -  follows with cards regularly - last OV and med changes reviewed and reinforced today The current medical regimen is effective;  continue present plan and medications.      Relevant Medications      lisinopril (PRINIVIL,ZESTRIL) tablet   Essential  hypertension    BP Readings from Last 3 Encounters:  05/09/14 110/72  05/03/14 118/80  10/23/13 130/82   The current medical regimen is effective;  continue present plan and medications.      Relevant Medications      lisinopril (PRINIVIL,ZESTRIL) tablet   Type 2 diabetes, uncontrolled, with neuropathy - Primary    Unable to use metformin due to CKD On OSA, ?compliance with same rx'd Januvia 10/2013 but did not start same due to cost Also rx'd ACEI (recent change by cards), statin and ASA Check a1c and add meds/titrate as needed - ?pt inquires about cinnamon Discussed importance for low carb diet and exercise to control same  Lab Results  Component Value Date   HGBA1C 9.1* 10/23/2013      Relevant Medications      lisinopril (PRINIVIL,ZESTRIL) tablet   Other Relevant Orders      Hemoglobin A1c      Ambulatory referral to Ophthalmology    Other Visit Diagnoses    Special screening for malignant neoplasms, colon

## 2014-05-09 NOTE — Assessment & Plan Note (Signed)
BP Readings from Last 3 Encounters:  05/09/14 110/72  05/03/14 118/80  10/23/13 130/82   The current medical regimen is effective;  continue present plan and medications.

## 2014-05-09 NOTE — Assessment & Plan Note (Signed)
Unable to use metformin due to CKD On OSA, ?compliance with same rx'd Januvia 10/2013 but did not start same due to cost Also rx'd ACEI (recent change by cards), statin and ASA Check a1c and add meds/titrate as needed - ?pt inquires about cinnamon Discussed importance for low carb diet and exercise to control same  Lab Results  Component Value Date   HGBA1C 9.1* 10/23/2013

## 2014-05-10 ENCOUNTER — Telehealth: Payer: Self-pay | Admitting: *Deleted

## 2014-05-10 ENCOUNTER — Telehealth: Payer: Self-pay | Admitting: Cardiology

## 2014-05-10 ENCOUNTER — Ambulatory Visit (HOSPITAL_COMMUNITY): Payer: Medicare Other | Attending: Internal Medicine | Admitting: Radiology

## 2014-05-10 ENCOUNTER — Other Ambulatory Visit: Payer: Self-pay | Admitting: Cardiology

## 2014-05-10 DIAGNOSIS — Z720 Tobacco use: Secondary | ICD-10-CM | POA: Diagnosis not present

## 2014-05-10 DIAGNOSIS — I5022 Chronic systolic (congestive) heart failure: Secondary | ICD-10-CM

## 2014-05-10 DIAGNOSIS — E119 Type 2 diabetes mellitus without complications: Secondary | ICD-10-CM | POA: Insufficient documentation

## 2014-05-10 DIAGNOSIS — I509 Heart failure, unspecified: Secondary | ICD-10-CM | POA: Diagnosis not present

## 2014-05-10 DIAGNOSIS — I350 Nonrheumatic aortic (valve) stenosis: Secondary | ICD-10-CM | POA: Diagnosis not present

## 2014-05-10 NOTE — Telephone Encounter (Signed)
Pt states someone called him from here about 15 or 20 mins ago.  There is no documentation of anyone attempting to contact him since he was in the office this am for his echo.  He is asking about his echo results and was informed Dr Aundra Dubin has not read it as of yet.  He is aware we will call him once we have those results.

## 2014-05-10 NOTE — Telephone Encounter (Signed)
New message ° ° ° ° ° °Returning a nurses call °

## 2014-05-10 NOTE — Progress Notes (Signed)
Echocardiogram performed.  

## 2014-05-10 NOTE — Telephone Encounter (Signed)
Patient in office for echocardiogram, requested samples of Crestor. Pt takes 40 mg tablets, provided crestor a week's worth of medication  (four 10 mg tab packets) Advised on dose.  Pt verbalizes understanding and appreciation for the assistance with medication.

## 2014-05-21 NOTE — Progress Notes (Signed)
Spoke with [facility] Clinic.  They will call and schedule the appointment.  Patient is aware.

## 2014-05-29 ENCOUNTER — Ambulatory Visit (INDEPENDENT_AMBULATORY_CARE_PROVIDER_SITE_OTHER): Payer: Medicare Other | Admitting: *Deleted

## 2014-05-29 DIAGNOSIS — I4892 Unspecified atrial flutter: Secondary | ICD-10-CM

## 2014-05-29 DIAGNOSIS — Z5181 Encounter for therapeutic drug level monitoring: Secondary | ICD-10-CM

## 2014-05-29 LAB — POCT INR: INR: 4.2

## 2014-06-04 ENCOUNTER — Other Ambulatory Visit: Payer: Self-pay | Admitting: Cardiology

## 2014-06-12 DIAGNOSIS — Z1212 Encounter for screening for malignant neoplasm of rectum: Secondary | ICD-10-CM | POA: Diagnosis not present

## 2014-06-12 DIAGNOSIS — Z1211 Encounter for screening for malignant neoplasm of colon: Secondary | ICD-10-CM | POA: Diagnosis not present

## 2014-06-12 LAB — COLOGUARD: COLOGUARD: NEGATIVE

## 2014-06-14 LAB — FECAL OCCULT BLOOD, GUAIAC: Fecal Occult Blood: NEGATIVE

## 2014-06-21 ENCOUNTER — Ambulatory Visit (INDEPENDENT_AMBULATORY_CARE_PROVIDER_SITE_OTHER): Payer: Medicare Other | Admitting: *Deleted

## 2014-06-21 DIAGNOSIS — Z5181 Encounter for therapeutic drug level monitoring: Secondary | ICD-10-CM

## 2014-06-21 DIAGNOSIS — I4892 Unspecified atrial flutter: Secondary | ICD-10-CM

## 2014-06-21 LAB — POCT INR: INR: 3

## 2014-07-18 ENCOUNTER — Telehealth: Payer: Self-pay

## 2014-07-18 NOTE — Telephone Encounter (Signed)
Patient called for samples of crestor 40 mg

## 2014-07-19 ENCOUNTER — Ambulatory Visit (INDEPENDENT_AMBULATORY_CARE_PROVIDER_SITE_OTHER): Payer: Medicare Other | Admitting: *Deleted

## 2014-07-19 DIAGNOSIS — Z5181 Encounter for therapeutic drug level monitoring: Secondary | ICD-10-CM

## 2014-07-19 DIAGNOSIS — I4892 Unspecified atrial flutter: Secondary | ICD-10-CM | POA: Diagnosis not present

## 2014-07-19 LAB — POCT INR: INR: 3.1

## 2014-07-23 ENCOUNTER — Ambulatory Visit: Payer: Medicare Other | Admitting: Cardiology

## 2014-07-30 ENCOUNTER — Encounter: Payer: Self-pay | Admitting: Internal Medicine

## 2014-08-02 ENCOUNTER — Ambulatory Visit (INDEPENDENT_AMBULATORY_CARE_PROVIDER_SITE_OTHER): Payer: Medicare Other | Admitting: Pharmacist

## 2014-08-02 ENCOUNTER — Ambulatory Visit (INDEPENDENT_AMBULATORY_CARE_PROVIDER_SITE_OTHER): Payer: Medicare Other | Admitting: *Deleted

## 2014-08-02 DIAGNOSIS — I4892 Unspecified atrial flutter: Secondary | ICD-10-CM

## 2014-08-02 DIAGNOSIS — Z5181 Encounter for therapeutic drug level monitoring: Secondary | ICD-10-CM | POA: Diagnosis not present

## 2014-08-02 DIAGNOSIS — I5022 Chronic systolic (congestive) heart failure: Secondary | ICD-10-CM

## 2014-08-02 LAB — MDC_IDC_ENUM_SESS_TYPE_INCLINIC
Battery Voltage: 3.05 V
Brady Statistic AP VP Percent: 0.14 %
Brady Statistic AP VS Percent: 36.5 %
Brady Statistic AS VP Percent: 0.07 %
Brady Statistic AS VS Percent: 63.29 %
Brady Statistic RA Percent Paced: 36.64 %
Brady Statistic RV Percent Paced: 0.21 %
HIGH POWER IMPEDANCE MEASURED VALUE: 304 Ohm
HIGH POWER IMPEDANCE MEASURED VALUE: 58 Ohm
HighPow Impedance: 171 Ohm
HighPow Impedance: 46 Ohm
Lead Channel Impedance Value: 342 Ohm
Lead Channel Pacing Threshold Amplitude: 1 V
Lead Channel Pacing Threshold Pulse Width: 0.4 ms
Lead Channel Pacing Threshold Pulse Width: 0.4 ms
Lead Channel Sensing Intrinsic Amplitude: 10.375 mV
Lead Channel Sensing Intrinsic Amplitude: 2.875 mV
Lead Channel Sensing Intrinsic Amplitude: 7.25 mV
Lead Channel Setting Pacing Amplitude: 2.5 V
Lead Channel Setting Sensing Sensitivity: 0.3 mV
MDC IDC MSMT LEADCHNL RA IMPEDANCE VALUE: 437 Ohm
MDC IDC MSMT LEADCHNL RA PACING THRESHOLD AMPLITUDE: 0.5 V
MDC IDC MSMT LEADCHNL RA SENSING INTR AMPL: 3 mV
MDC IDC SESS DTM: 20160128092446
MDC IDC SET LEADCHNL RA PACING AMPLITUDE: 2 V
MDC IDC SET LEADCHNL RV PACING PULSEWIDTH: 0.4 ms
MDC IDC SET ZONE DETECTION INTERVAL: 340 ms
Zone Setting Detection Interval: 300 ms
Zone Setting Detection Interval: 390 ms
Zone Setting Detection Interval: 400 ms

## 2014-08-02 LAB — POCT INR: INR: 2.8

## 2014-08-02 NOTE — Progress Notes (Signed)
ICD check in clinic. Normal device function. Thresholds and sensing consistent with previous device measurements. Impedance trends stable over time. No evidence of any ventricular arrhythmias. No mode switches.  Optivol and thoracic impedance normal.   Histogram distribution appropriate for patient and level of activity. No changes made this session. Device programmed at appropriate safety margins. Device programmed to optimize intrinsic conduction. Patient education completed including shock plan. Alert tones/vibration demonstrated for patient.  ROV in May with Dr. Rayann Heman.

## 2014-08-03 ENCOUNTER — Ambulatory Visit (HOSPITAL_COMMUNITY)
Admission: RE | Admit: 2014-08-03 | Discharge: 2014-08-03 | Disposition: A | Discharge status: Home / Self Care | Payer: Medicare Other | Source: Ambulatory Visit | Attending: Cardiology | Admitting: Cardiology

## 2014-08-03 VITALS — BP 108/62 | HR 80 | Wt 250.8 lb

## 2014-08-03 DIAGNOSIS — IMO0002 Reserved for concepts with insufficient information to code with codable children: Secondary | ICD-10-CM

## 2014-08-03 DIAGNOSIS — E1140 Type 2 diabetes mellitus with diabetic neuropathy, unspecified: Secondary | ICD-10-CM | POA: Diagnosis not present

## 2014-08-03 DIAGNOSIS — E785 Hyperlipidemia, unspecified: Secondary | ICD-10-CM | POA: Diagnosis not present

## 2014-08-03 DIAGNOSIS — I5022 Chronic systolic (congestive) heart failure: Secondary | ICD-10-CM | POA: Diagnosis not present

## 2014-08-03 DIAGNOSIS — N183 Chronic kidney disease, stage 3 unspecified: Secondary | ICD-10-CM

## 2014-08-03 DIAGNOSIS — E119 Type 2 diabetes mellitus without complications: Secondary | ICD-10-CM | POA: Insufficient documentation

## 2014-08-03 DIAGNOSIS — I4892 Unspecified atrial flutter: Secondary | ICD-10-CM | POA: Diagnosis not present

## 2014-08-03 DIAGNOSIS — I2510 Atherosclerotic heart disease of native coronary artery without angina pectoris: Secondary | ICD-10-CM | POA: Insufficient documentation

## 2014-08-03 DIAGNOSIS — E1165 Type 2 diabetes mellitus with hyperglycemia: Secondary | ICD-10-CM | POA: Diagnosis not present

## 2014-08-03 NOTE — Progress Notes (Signed)
Patient ID: Anthony Rubio., male   DOB: 1950/10/04, 64 y.o.   MRN: [id number] PCP: Dr. Asa Lente  64 yo AAM with history of CAD and mixed ischemic/nonischemic cardiomyopathy, NYHA Class III CHF, and diabetes who presents for followup. He initially was hospitalized in 5/11 with acute decompensated CHF after several days of shortness of breath, orthopnea, and PND. He was diuresed and left/right heart catheterization was done. Left heart cath showed large, ectatic coronaries with slow flow and moderate to severe diffuse distal vessel disease (consistent with diabetes) without good interventional options. EF was 15-20% by echo. He was put on milrinone and further diuresed for several more days. He was titrated off milrinone and discharged on an oral medication regimen. He later developed atrial flutter requiring cardioversion. He remains in NSR. Finally, patient had Medtronic ICD placed in 11/11.  Repeat echo in 10/12 showed that EF remains 25%.   He returns for follow up. Overall feeling ok. Limited mobility due to neuropathy. Uses an electric cart in the grocery today. Denies SOB/PND/Orthopnea/CP. Not weighing at home. Following low salt diet. Not following fluid restrictions. Taking all medications.   Labs (9/11): LDL 61, HDL 26, K 4.8, creatinine 1.6  Labs (10/11): K 4.3, creatinine 1.3, BNP 165  Labs (11/11): K 4.9, creatinine 1.6  Labs (12/11): K 4.8, creatinine 1.7  Labs (2/12): LDL 110, HDL 28  Labs (3/12): K 4.1, creatinine 1.7 Labs (6/12): K 4.4, creatinine 1.4, BNP 125 Labs (10/12): K 4.8, creatinine 1.4 Labs (12/12): K 4.4, creatinine 1.5, BNP 20 Labs (3/13): K 5, creatinine 1.5, LDL 43, HDL 31 Labs (8/13): K 4.2, creatinine 1.3, BNP 158 lAbs (05/03/14): K 4.6 Creatinine 1.4   Allergies:  1) ! Erythromycin   Past Medical History:  1. CAD: LHC (5/11) with 50-60% mid LAD, diffuse moderate distal LAD up to 60-70%, small OM2 subtotally occluded, moderate to severe diffuse distal PLOM  disease, severe diffuse distal RCA, PDA, and PLV disease. Coronaries were ectatic with slow flow. Patient was started on Plavix given concern for thrombus formation in the ectatic coronaries. He stopped Plavix after starting coumadin with atrial flutter.  2. CHF: Systolic dysfunction, likely mixed ischemic/nonischemic. Echo (5/11) with EF 15-20%, severe global hypokinesis, mild MR, moderate diastolic dysfunction, mildly decreased RV systolic function. RHC (5/11) with mean RA 14 mmHg, PA 38/26, mean PCWP 25 mmHg, CI 1.8. Repeat echo (8/11) with EF 0000000, severe diastolic dysfunction, mild MR, RV normal size and systolic function. Per official read, cannot rule out apical clot but it does appear to be only trabeculations. TEE (9/11): EF 15% with global hypokinesis, mild to moderate MR, no LV thrombus and no LAA thrombus.  Echo (10/12): EF 25%.  3. Type II diabetes, uncontrolled  4. HTN  5. Smoker  6. severe diabetic neuropathy -with chronic gait dysfunction  7. History of left foot ulcer with poor healing  8. Atrial flutter s/p DCCV 9/11  9. Medtronic dual chamber ICD with Optivol (11/11)   Family History:  Family History of Colon CA 1st degree relative <60 9other relative)  Heart disease (grandparent)  Stroke (other relative)  dm only in secondary relatives   Social History:  Pt lives in [place]. Smoker, about 1/4 ppd.  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    Current Outpatient Prescriptions  Medication Sig Dispense Refill  . B Complex Vitamins (B COMPLEX  100 PO) Take 1 tablet by mouth daily as needed.      . carvedilol (COREG) 12.5 MG tablet Take one and one-half by mouth twice a day 90 tablet 6  . fish oil-omega-3 fatty acids 1000 MG capsule Take 2 g by mouth as needed.  60 capsule 11  . furosemide (LASIX) 40 MG tablet TAKE ONE-HALF TABLET BY MOUTH ONCE DAILY 30 tablet  6  . glipiZIDE (GLUCOTROL XL) 10 MG 24 hr tablet Take 1 tablet (10 mg total) by mouth daily. 30 tablet 11  . glucose blood (RELION PRIME TEST) test strip Use to check blood sugars twice a day Dx 250.62 100 each 11  . isosorbide mononitrate (IMDUR) 30 MG 24 hr tablet Take 1 tablet (30 mg total) by mouth daily. 30 tablet 6  . lisinopril (ZESTRIL) 2.5 MG tablet Take 1 tablet (2.5 mg total) by mouth daily. 30 tablet 3  . RELION PRIME TEST test strip USE TO CHECK BLOOD SUGAR TWICE A DAY. 100 each 11  . rosuvastatin (CRESTOR) 40 MG tablet Take 40 mg by mouth daily.      Marland Kitchen spironolactone (ALDACTONE) 25 MG tablet TAKE ONE TABLET BY MOUTH ONCE DAILY *NEEDS TO BE SEEN* 30 tablet 3  . warfarin (COUMADIN) 5 MG tablet TAKE TWO TABLETS BY MOUTH ONCE DAILY, EXCEPT 1.5 TABLETS ON  MONDAYS, WEDNESDAYS, AND FRIDAYS OR AS DIRECTED BY COUMADIN CLINIC 50 tablet 1   No current facility-administered medications for this encounter.    BP 108/62 mmHg  Pulse 80  Wt 250 lb 12 oz (113.739 kg)  SpO2 99% General: Well developed, well nourished, in no acute distress.  Neck: Neck supple, no JVD. No masses, thyromegaly or abnormal cervical nodes.  Lungs: Clear bilaterally to auscultation and percussion.  Heart: Non-displaced PMI, chest non-tender; regular rate and rhythm, S1, S2 without murmurs, rubs or gallops. Carotid upstroke normal, no bruit. I am unable to palpate his pedal pulses but feet are warm. No edema, no varicosities.  Abdomen: Bowel sounds positive; abdomen soft and non-tender without masses, organomegaly, or hernias noted. No hepatosplenomegaly.  Extremities: No clubbing or cyanosis.  Neurologic: Alert and oriented x 3.  Psych: Normal affect.  Assessment/Plan:  ATRIAL FLUTTER  Maintaining NSR. Continue warfarin. Will need to consider ablation if it recurs.  CHRONIC KIDNEY DISEASE STAGE III (MODERATE) Renal fxn has been stable. Likely component of diabetic nephropathy.  Chronic systolic heart failure   Systolic CHF. Has Medtronic ICD. ECHO 05/2014 EF 20-25%. This may be multifactorial, due to significant distal vessel CAD as well as a history of poorly controlled diabetes. He has NYHA class II symptoms.  Continue current dose of coreg, spironolactone, isordil, lisinopril, and Lasix.  I have asked him to weigh and  record daily.  Next visit could consider adding corlanor. He would like to discuss with Dr Aundra Dubin.  CORONARY ARTERY DISEASE Diffuse moderate to severe distal vessel disease on cath with ectatic coronaries with slow flow. No interventional target.  No evidence of ischemia. Continue ASA, statin, ACEI, beta blocker.  HYPERLIPIDEMIA Continue high dose statin  DIABETES MELLITUS, TYPE II  PCP.   Follow up in 3 months wit Dr Precious Bard NP-C  08/03/2014

## 2014-08-03 NOTE — Patient Instructions (Signed)
Your physician recommends that you schedule a follow-up appointment in: 3 months with Dr.McLean at [facility] the following things EVERYDAY: 1) Weigh yourself in the morning before breakfast. Write it down and keep it in a log. 2) Take your medicines as prescribed 3) Eat low salt foods-Limit salt (sodium) to 2000 mg per day.  4) Stay as active as you can everyday 5) Limit all fluids for the day to less than 2 liters.

## 2014-08-08 ENCOUNTER — Encounter: Payer: Self-pay | Admitting: Internal Medicine

## 2014-08-14 ENCOUNTER — Encounter: Payer: Self-pay | Admitting: Internal Medicine

## 2014-08-27 ENCOUNTER — Telehealth: Payer: Self-pay | Admitting: Cardiology

## 2014-08-27 NOTE — Telephone Encounter (Signed)
New message      Want samples of crestor 40mg .  Please call him and let him know

## 2014-08-30 ENCOUNTER — Ambulatory Visit (INDEPENDENT_AMBULATORY_CARE_PROVIDER_SITE_OTHER): Payer: Medicare Other | Admitting: *Deleted

## 2014-08-30 DIAGNOSIS — Z5181 Encounter for therapeutic drug level monitoring: Secondary | ICD-10-CM

## 2014-08-30 DIAGNOSIS — I4892 Unspecified atrial flutter: Secondary | ICD-10-CM | POA: Diagnosis not present

## 2014-08-30 LAB — POCT INR: INR: 1.8

## 2014-09-05 ENCOUNTER — Other Ambulatory Visit: Payer: Self-pay | Admitting: Cardiology

## 2014-09-06 ENCOUNTER — Other Ambulatory Visit: Payer: Self-pay | Admitting: Cardiology

## 2014-09-27 ENCOUNTER — Ambulatory Visit (INDEPENDENT_AMBULATORY_CARE_PROVIDER_SITE_OTHER): Payer: Medicare Other | Admitting: *Deleted

## 2014-09-27 DIAGNOSIS — I4892 Unspecified atrial flutter: Secondary | ICD-10-CM

## 2014-09-27 DIAGNOSIS — Z5181 Encounter for therapeutic drug level monitoring: Secondary | ICD-10-CM | POA: Diagnosis not present

## 2014-09-27 LAB — POCT INR: INR: 2.6

## 2014-10-19 ENCOUNTER — Other Ambulatory Visit: Payer: Self-pay | Admitting: Internal Medicine

## 2014-10-19 ENCOUNTER — Other Ambulatory Visit: Payer: Self-pay | Admitting: Cardiology

## 2014-10-26 ENCOUNTER — Ambulatory Visit: Payer: Medicare Other | Admitting: Cardiology

## 2014-11-01 ENCOUNTER — Ambulatory Visit (INDEPENDENT_AMBULATORY_CARE_PROVIDER_SITE_OTHER): Payer: Medicare Other

## 2014-11-01 DIAGNOSIS — I4892 Unspecified atrial flutter: Secondary | ICD-10-CM

## 2014-11-01 DIAGNOSIS — Z5181 Encounter for therapeutic drug level monitoring: Secondary | ICD-10-CM | POA: Diagnosis not present

## 2014-11-01 LAB — POCT INR: INR: 3.3

## 2014-11-13 ENCOUNTER — Other Ambulatory Visit: Payer: Self-pay | Admitting: Internal Medicine

## 2014-11-22 ENCOUNTER — Encounter: Payer: Medicare Other | Admitting: Internal Medicine

## 2014-11-29 ENCOUNTER — Ambulatory Visit (INDEPENDENT_AMBULATORY_CARE_PROVIDER_SITE_OTHER): Payer: Medicare Other

## 2014-11-29 DIAGNOSIS — E11359 Type 2 diabetes mellitus with proliferative diabetic retinopathy without macular edema: Secondary | ICD-10-CM | POA: Diagnosis not present

## 2014-11-29 DIAGNOSIS — Z5181 Encounter for therapeutic drug level monitoring: Secondary | ICD-10-CM | POA: Diagnosis not present

## 2014-11-29 DIAGNOSIS — I4892 Unspecified atrial flutter: Secondary | ICD-10-CM | POA: Diagnosis not present

## 2014-11-29 LAB — POCT INR: INR: 1.6

## 2014-11-29 LAB — HM DIABETES EYE EXAM

## 2014-12-10 ENCOUNTER — Other Ambulatory Visit: Payer: Self-pay

## 2014-12-10 MED ORDER — SPIRONOLACTONE 25 MG PO TABS: ORAL_TABLET | ORAL | Status: DC

## 2014-12-12 ENCOUNTER — Encounter: Payer: Self-pay | Admitting: Internal Medicine

## 2014-12-14 ENCOUNTER — Other Ambulatory Visit (HOSPITAL_COMMUNITY): Payer: Self-pay | Admitting: *Deleted

## 2014-12-24 ENCOUNTER — Other Ambulatory Visit: Payer: Self-pay | Admitting: Cardiology

## 2014-12-26 ENCOUNTER — Telehealth: Payer: Self-pay | Admitting: *Deleted

## 2014-12-26 ENCOUNTER — Encounter: Payer: Self-pay | Admitting: *Deleted

## 2014-12-26 NOTE — Telephone Encounter (Signed)
called and got fm hx & status.Marland KitchenMarland Kitchen

## 2014-12-31 ENCOUNTER — Encounter: Payer: Self-pay | Admitting: Internal Medicine

## 2014-12-31 ENCOUNTER — Ambulatory Visit (INDEPENDENT_AMBULATORY_CARE_PROVIDER_SITE_OTHER): Payer: Medicare Other | Admitting: *Deleted

## 2014-12-31 ENCOUNTER — Ambulatory Visit (INDEPENDENT_AMBULATORY_CARE_PROVIDER_SITE_OTHER): Payer: Medicare Other | Admitting: Internal Medicine

## 2014-12-31 VITALS — BP 104/72 | HR 85 | Ht 75.5 in | Wt 251.0 lb

## 2014-12-31 DIAGNOSIS — I4892 Unspecified atrial flutter: Secondary | ICD-10-CM

## 2014-12-31 DIAGNOSIS — Z5181 Encounter for therapeutic drug level monitoring: Secondary | ICD-10-CM

## 2014-12-31 DIAGNOSIS — Z95810 Presence of automatic (implantable) cardiac defibrillator: Secondary | ICD-10-CM

## 2014-12-31 DIAGNOSIS — I5022 Chronic systolic (congestive) heart failure: Secondary | ICD-10-CM

## 2014-12-31 DIAGNOSIS — I10 Essential (primary) hypertension: Secondary | ICD-10-CM

## 2014-12-31 LAB — CUP PACEART INCLINIC DEVICE CHECK
Battery Voltage: 3.02 V
Brady Statistic AP VP Percent: 0.11 %
Brady Statistic AP VS Percent: 32.19 %
Brady Statistic AS VS Percent: 67.64 %
Brady Statistic RA Percent Paced: 32.3 %
HIGH POWER IMPEDANCE MEASURED VALUE: 304 Ohm
HIGH POWER IMPEDANCE MEASURED VALUE: 51 Ohm
HIGH POWER IMPEDANCE MEASURED VALUE: 61 Ohm
HighPow Impedance: 190 Ohm
Lead Channel Pacing Threshold Amplitude: 0.5 V
Lead Channel Pacing Threshold Amplitude: 1 V
Lead Channel Sensing Intrinsic Amplitude: 8.75 mV
Lead Channel Setting Pacing Amplitude: 2 V
Lead Channel Setting Pacing Amplitude: 2.5 V
Lead Channel Setting Sensing Sensitivity: 0.3 mV
MDC IDC MSMT LEADCHNL RA IMPEDANCE VALUE: 437 Ohm
MDC IDC MSMT LEADCHNL RA PACING THRESHOLD PULSEWIDTH: 0.4 ms
MDC IDC MSMT LEADCHNL RA SENSING INTR AMPL: 1.875 mV
MDC IDC MSMT LEADCHNL RV IMPEDANCE VALUE: 399 Ohm
MDC IDC MSMT LEADCHNL RV PACING THRESHOLD PULSEWIDTH: 0.4 ms
MDC IDC SESS DTM: 20160627105450
MDC IDC SET LEADCHNL RV PACING PULSEWIDTH: 0.4 ms
MDC IDC SET ZONE DETECTION INTERVAL: 300 ms
MDC IDC SET ZONE DETECTION INTERVAL: 340 ms
MDC IDC STAT BRADY AS VP PERCENT: 0.06 %
MDC IDC STAT BRADY RV PERCENT PACED: 0.17 %
Zone Setting Detection Interval: 390 ms
Zone Setting Detection Interval: 400 ms

## 2014-12-31 LAB — POCT INR: INR: 3.1

## 2014-12-31 NOTE — Progress Notes (Signed)
PCP:  Gwendolyn Grant, MD Primary Cardiologist:  Dr Aundra Dubin  The patient presents today for routine electrophysiology followup.  Since last being seen in our clinic, the patient reports doing very well.    Today, he denies symptoms of palpitations, chest pain, shortness of breath, orthopnea, PND, lower extremity edema,  presyncope, syncope, or neurologic sequela.  The patient feels that he is tolerating medications without difficulties and is otherwise without complaint today.   Past Medical History  Diagnosis Date  . CAD (coronary artery disease)     a. LHC (5/11) with 50-60% mid LAD, diffuse mod distal LAD up to 60-70%, small OM2 subtotally occ, mod-severe diffuse distal PLOM, severe diffuse distal RCA, PDA, and PLV  disease. Started on Plavix given concern for thrombus formation in the ectatic cors with slow flow. b. Stopped Plavix after starting Coumadin for a-flutter.  . CHF (congestive heart failure)     a. Likely mixed ICM/NICM. Echo (5/11) with EF 15-20%, severe global HK, mild MR, mod d/d, mildly decreased RV fcn. RHC (5/11) with mean RA 14 mmHg, PA 38/26, mean PCWP 25 mmHg, CI 1.8.  b. TEE (9/11): EF 15% no LV thrombus, no LAA thrombus. c. s/p ICD 11/11. d. Last echo 04/2011: LV severely dilated, EF 25%, diffuse HK, mild AS, mildly dilated LA, trivial pericardial effusion.  Marland Kitchen Uncontrolled type II diabetes mellitus   . HTN (hypertension)   . Diabetic neuropathy     severe; with chronic gait dysfunction  . Foot ulcer, left     poor healing  . Atrial flutter     s/p DCCV 9/11  . ICD (implantable cardiac defibrillator) in place 11/11    Medtronic dual cha,ber ICD with Optivol  . Aortic stenosis     a. Mild by echo 04/2011.   . Tobacco abuse   . CKD (chronic kidney disease), stage III    Past Surgical History  Procedure Laterality Date  . Icd implantation  2011    by [facility]    Current Outpatient Prescriptions  Medication Sig Dispense Refill  . B Complex Vitamins (B COMPLEX 100 PO)  Take 1 tablet by mouth daily as needed (vitamin supplement).     . carvedilol (COREG) 12.5 MG tablet TAKE ONE & ONE-HALF TABLETS BY MOUTH TWICE DAILY 90 tablet 0  . furosemide (LASIX) 40 MG tablet TAKE ONE-HALF TABLET BY MOUTH ONCE DAILY 30 tablet 6  . glipiZIDE (GLUCOTROL XL) 10 MG 24 hr tablet Take 1 tablet (10 mg total) by mouth daily. 90 tablet 3  . glucose blood (RELION PRIME TEST) test strip Use to check blood sugars twice a day Dx 250.62 100 each 11  . isosorbide mononitrate (IMDUR) 30 MG 24 hr tablet Take 1 tablet (30 mg total) by mouth daily. 30 tablet 6  . lisinopril (PRINIVIL,ZESTRIL) 2.5 MG tablet TAKE ONE TABLET BY MOUTH ONCE DAILY 90 tablet 2  . RELION PRIME TEST test strip USE TO CHECK BLOOD SUGAR TWICE A DAY. 100 each 11  . rosuvastatin (CRESTOR) 40 MG tablet Take 40 mg by mouth daily.      Marland Kitchen spironolactone (ALDACTONE) 25 MG tablet TAKE ONE TABLET BY MOUTH ONCE DAILY *NEEDS  TO  BE  SEEN* 30 tablet 1  . warfarin (COUMADIN) 5 MG tablet Take as directed by coumadin clinic 50 tablet 3   No current facility-administered medications for this visit.    Allergies  Allergen Reactions  . Erythromycin     REACTION: made toes \\T \ finger tingle  .  Lisinopril Other (See Comments)    Higher doses make him feel bad    History   Social History  . Marital Status: Married    Spouse Name: N/A  . Number of Children: N/A  . Years of Education: N/A   Occupational History  . Retired Barrister's clerk   . Former [employer]    Social History Main Topics  . Smoking status: Current Some Day Smoker  . Smokeless tobacco: Not on file     Comment: about 1/4 ppd. Married, lives with wife. 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 Alcohol Use: No  . Drug Use: Not on file  . Sexual Activity: Not on file   Other Topics Concern  . Not on file   Social History Narrative    Family History  Problem Relation Age of Onset  . Diabetes Paternal Grandfather      Physical  Exam: Filed Vitals:   12/31/14 1010  BP: 104/72  Pulse: 85  Height: 6' 3.5" (1.918 m)  Weight: 113.853 kg (251 lb)    GEN- The patient is well appearing, alert and oriented x 3 today.   Head- normocephalic, atraumatic Eyes-  Sclera clear, conjunctiva pink Ears- hearing intact Oropharynx- clear Neck- supple  Lungs- Clear to ausculation bilaterally, normal work of breathing Chest- ICD pocket is well healed Heart- Regular rate and rhythm, no murmurs, rubs or gallops, PMI not laterally displaced GI- soft, NT, ND, + BS Extremities- no clubbing, cyanosis, or edema In a wheelchair today  ICD interrogation- reviewed in detail today,  See PACEART report  Assessment and Plan:   1. Chronic systolic dysfunction Normal ICD function See Pace Art report No changes today Will give WireX adapter and enroll in [facility] for remote monitoring  2. HTN Stable No change required today  3. CAD No ischemic symptoms No changes today  4. Atrial flutter No episodes in a very long time On coumadin  Carelink Return in 1 year to see EP NP

## 2014-12-31 NOTE — Patient Instructions (Signed)
Medication Instructions:  Your physician recommends that you continue on your current medications as directed. Please refer to the Current Medication list given to you today.   Labwork: None ordered  Testing/Procedures: None ordered  Follow-Up: Your physician wants you to follow-up in: 12 months with Chanetta Marshall, NP You will receive a reminder letter in the mail two months in advance. If you don't receive a letter, please call our office to schedule the follow-up appointment.   Remote monitoring is used to monitor your Pacemaker or ICD from home. This monitoring reduces the number of office visits required to check your device to one time per year. It allows [place] to keep an eye on the functioning of your device to ensure it is working properly. You are scheduled for a device check from home on 04/01/15. You may send your transmission at any time that day. If you have a wireless device, the transmission will be sent automatically. After your physician reviews your transmission, you will receive a postcard with your next transmission date.    Any Other Special Instructions Will Be Listed Below (If Applicable).

## 2015-01-02 ENCOUNTER — Ambulatory Visit: Payer: Medicare Other | Admitting: Cardiology

## 2015-01-14 ENCOUNTER — Encounter: Payer: Self-pay | Admitting: Cardiology

## 2015-01-29 ENCOUNTER — Ambulatory Visit (INDEPENDENT_AMBULATORY_CARE_PROVIDER_SITE_OTHER): Payer: Medicare Other | Admitting: *Deleted

## 2015-01-29 DIAGNOSIS — I4892 Unspecified atrial flutter: Secondary | ICD-10-CM | POA: Diagnosis not present

## 2015-01-29 DIAGNOSIS — Z5181 Encounter for therapeutic drug level monitoring: Secondary | ICD-10-CM | POA: Diagnosis not present

## 2015-01-29 LAB — POCT INR: INR: 2.2

## 2015-02-19 ENCOUNTER — Other Ambulatory Visit: Payer: Self-pay | Admitting: Physician Assistant

## 2015-02-19 ENCOUNTER — Other Ambulatory Visit: Payer: Self-pay | Admitting: Internal Medicine

## 2015-02-20 ENCOUNTER — Other Ambulatory Visit: Payer: Self-pay | Admitting: Cardiology

## 2015-02-20 ENCOUNTER — Other Ambulatory Visit: Payer: Self-pay | Admitting: Physician Assistant

## 2015-03-05 ENCOUNTER — Other Ambulatory Visit: Payer: Self-pay | Admitting: Physician Assistant

## 2015-03-06 NOTE — Progress Notes (Signed)
Cardiology Office Note   Date:  03/07/2015   ID:  Anthony Rubio., DOB 1951/02/19, MRN [id number]  PCP:  Gwendolyn Grant, MD  Cardiologist:  Dr. [name] ([facility])  Electrophysiologist:  Dr. Thompson Grayer   Chief Complaint  Patient presents with  . Congestive Heart Failure     History of Present Illness: Anthony Rubio. is a 64 y.o. male with a hx of CAD, mixed ischemic/nonischemic cardiomyopathy, systolic HF, diabetes. He initially was hospitalized in 5/11 with acute decompensated CHF after several days of shortness of breath, orthopnea, and PND. He was diuresed and left/right heart catheterization was done. Left heart cath showed large, ectatic coronaries with slow flow and moderate to severe diffuse distal vessel disease (consistent with diabetes) without good interventional options. EF was 15-20% by echo. He was put on milrinone and further diuresed for several more days. He was titrated off milrinone and discharged on an oral medication regimen. He later developed atrial flutter requiring cardioversion. He has remained in NSR. Finally, patient had Medtronic ICD placed in 11/11. Repeat echo in 11/15 demonstrated EF 20-25% and probable mild to mod AS, mod MR, PASP 59 mmHg.    Last seen in HF Clinic in 1/16.  Returns for FU.  Here today with his wife. Overall doing well. Denies chest pain. He is quite limited by diabetic peripheral neuropathy. He's able to get up steps in his home without significant dyspnea. Denies orthopnea, PND. Denies syncope. Denies ICD shock. He does have a cough at night seems to occur with certain foods. His wife does note history of snoring as well as witnessed apneic episodes. He does continue to smoke.   Studies/Reports Reviewed Today:  Echo 05/2014 Apical clot could not be ruled out, mild focal basal septal hypertrophy, EF 20-25%, diffuse HK, apical AK, probable mild to moderate aortic stenosis (gradient underestimated 2/2 LV dysfunction), mild AI,  mean gradient 12 mmHg, aortic root mildly dilated (39 mm), moderate MR, severe LAE, mild to moderate TR, PASP 59 mmHg (moderate pulmonary hypertension)   Past Medical History  Diagnosis Date  . CAD (coronary artery disease)     a. LHC (5/11) with 50-60% mid LAD, diffuse mod distal LAD up to 60-70%, small OM2 subtotally occ, mod-severe diffuse distal PLOM, severe diffuse distal RCA, PDA, and PLV  disease. Started on Plavix given concern for thrombus formation in the ectatic cors with slow flow. b. Stopped Plavix after starting Coumadin for a-flutter.  . CHF (congestive heart failure)     a. Likely mixed ICM/NICM. Echo (5/11) with EF 15-20%, severe global HK, mild MR, mod d/d, mildly decreased RV fcn. RHC (5/11) with mean RA 14 mmHg, PA 38/26, mean PCWP 25 mmHg, CI 1.8.  b. TEE (9/11): EF 15% no LV thrombus, no LAA thrombus. c. s/p ICD 11/11. d. Last echo 04/2011: LV severely dilated, EF 25%, diffuse HK, mild AS, mildly dilated LA, trivial pericardial effusion.  Marland Kitchen Uncontrolled type II diabetes mellitus   . HTN (hypertension)   . Diabetic neuropathy     severe; with chronic gait dysfunction  . Foot ulcer, left     poor healing  . Atrial flutter     s/p DCCV 9/11  . ICD (implantable cardiac defibrillator) in place 11/11    Medtronic dual cha,ber ICD with Optivol  . Aortic stenosis     a. Mild by echo 04/2011.   . Tobacco abuse   . CKD (chronic kidney disease), stage III   1.  CAD: LHC (5/11) with 50-60% mid LAD, diffuse moderate distal LAD up to 60-70%, small OM2 subtotally occluded, moderate to severe diffuse distal PLOM disease, severe diffuse distal RCA, PDA, and PLV disease. Coronaries were ectatic with slow flow. Patient was started on Plavix given concern for thrombus formation in the ectatic coronaries. He stopped Plavix after starting coumadin with atrial flutter.  2. CHF: Systolic dysfunction, likely mixed ischemic/nonischemic. Echo (5/11) with EF 15-20%, severe global hypokinesis, mild  MR, moderate diastolic dysfunction, mildly decreased RV systolic function. RHC (5/11) with mean RA 14 mmHg, PA 38/26, mean PCWP 25 mmHg, CI 1.8. Repeat echo (8/11) with EF 0000000, severe diastolic dysfunction, mild MR, RV normal size and systolic function. Per official read, cannot rule out apical clot but it does appear to be only trabeculations. TEE (9/11): EF 15% with global hypokinesis, mild to moderate MR, no LV thrombus and no LAA thrombus. Echo (10/12): EF 25%.  3. Type II diabetes, uncontrolled  4. HTN  5. Smoker  6. severe diabetic neuropathy -with chronic gait dysfunction  7. History of left foot ulcer with poor healing  8. Atrial flutter s/p DCCV 9/11  9. Medtronic dual chamber ICD with Optivol (11/11)    Past Surgical History  Procedure Laterality Date  . Icd implantation  2011    by [facility]     Current Outpatient Prescriptions  Medication Sig Dispense Refill  . B Complex Vitamins (B COMPLEX 100 PO) Take 1 tablet by mouth daily as needed (vitamin supplement).     . carvedilol (COREG) 12.5 MG tablet TAKE ONE & ONE-HALF TABLETS BY MOUTH TWICE DAILY 90 tablet 0  . furosemide (LASIX) 40 MG tablet TAKE ONE-HALF TABLET BY MOUTH ONCE DAILY 30 tablet 6  . glipiZIDE (GLUCOTROL XL) 10 MG 24 hr tablet Take 1 tablet (10 mg total) by mouth daily. 90 tablet 3  . glucose blood (RELION PRIME TEST) test strip Use to check blood sugars twice a day Dx 250.62 100 each 11  . isosorbide mononitrate (IMDUR) 30 MG 24 hr tablet Take 1 tablet (30 mg total) by mouth daily. 30 tablet 6  . lisinopril (PRINIVIL,ZESTRIL) 2.5 MG tablet TAKE ONE TABLET BY MOUTH ONCE DAILY 90 tablet 2  . RELION PRIME TEST test strip USE TO CHECK BLOOD SUGAR TWICE A DAY. 100 each 11  . spironolactone (ALDACTONE) 25 MG tablet TAKE ONE TABLET BY MOUTH ONCE DAILY. "NEED TO BE SEEN" 30 tablet 0  . warfarin (COUMADIN) 5 MG tablet TAKE AS DIRECTED BY COUMADIN CLINIC 50 tablet 0  . rosuvastatin (CRESTOR) 40 MG tablet Take 40 mg  by mouth daily.       No current facility-administered medications for this visit.    Allergies:   Erythromycin and Lisinopril    Social History:  The patient  reports that he has been smoking.  He does not have any smokeless tobacco history on file. He reports that he does not drink alcohol.   Family History:  The patient's family history includes Diabetes in his paternal grandfather.    ROS:   Please see the history of present illness.   Review of Systems  All other systems reviewed and are negative.     PHYSICAL EXAM: VS:  BP 120/78 mmHg  Pulse 73  Ht 6' 3.5" (1.918 m)  Wt 256 lb 1.9 oz (116.175 kg)  BMI 31.58 kg/m2    Wt Readings from Last 3 Encounters:  03/07/15 256 lb 1.9 oz (116.175 kg)  12/31/14 251 lb (113.853  kg)  08/03/14 250 lb 12 oz (113.739 kg)     GEN: Well nourished, well developed, in no acute distress HEENT: normal Neck: no JVD, no masses Cardiac:  Normal S1/S2, RRR; no murmur ,  no rubs or gallops, no edema   Respiratory:  Decreased breath sounds bilaterally, no wheezing, rhonchi or rales. GI: soft, nontender, nondistended, + BS MS: no deformity or atrophy Skin: warm and dry  Neuro:  CNs II-XII intact, Strength and sensation are intact Psych: Normal affect   EKG:  EKG is ordered today.  It demonstrates:   NSR, HR 73 intermittent atrial paced, inferior Q waves, anterolateral Q waves, IVCD, QTc 491 ms, no change from prior tracings   Recent Labs: 05/03/2014: ALT 19; BUN 27*; Creatinine, Ser 1.4; Hemoglobin 12.5*; Platelets 178.0; Potassium 4.6; Sodium 139    Lipid Panel    Component Value Date/Time   CHOL 132 05/03/2014 1050   TRIG 78.0 05/03/2014 1050   TRIG 157 11/06/2009   HDL 27.40* 05/03/2014 1050   CHOLHDL 5 05/03/2014 1050   VLDL 15.6 05/03/2014 1050   LDLCALC 89 05/03/2014 1050      ASSESSMENT AND PLAN:  Chronic systolic heart failure: Overall volume stable. Continue current therapy. Check follow-up BMET  today.  Cardiomyopathy- Mixed Ischemic and Non-Ischemic: Recent EF 20-25%. Continue beta blocker, nitrates, ACE inhibitor, spironolactone.  Coronary artery disease involving native coronary artery of native heart without angina pectoris: No angina. Continue beta blocker, statin, nitrates. He is not on aspirin as he is on Coumadin.  Atrial flutter, unspecified: No apparent recurrence. He does have a history of snoring and witnessed apneic episodes. I recommended proceeding with sleep testing today. He is somewhat hesitant. He will let me know if he changes his mind. Continue Coumadin.  Aortic stenosis: Likely mild to moderate aortic stenosis by previous echocardiogram. Gradient was felt to be underestimated secondary to low EF. He denies chest pain, syncope. Repeat echocardiogram prior to next office visit with Dr. Aundra Dubin  Essential hypertension: Controlled.  HLD (hyperlipidemia): Continue statin. Crestor is too expensive for him. He will check with his pharmacy to see if atorvastatin 80 mg is affordable. If so, we will change and check lipids and LFTs in 2 months.  ICD (implantable cardioverter-defibrillator), dual, in situ: Follow-up with EP as planned.  Type 2 diabetes, uncontrolled, with neuropathy: Follow-up with primary care.     Medication Changes: Current medicines are reviewed at length with the patient today.  Concerns regarding medicines are as outlined above.  The following changes have been made:   Discontinued Medications   No medications on file   Modified Medications   No medications on file   New Prescriptions   No medications on file    Labs/ tests ordered today include:   No orders of the defined types were placed in this encounter.     Disposition:    FU with Dr. [name] 4 mos   Signed, Versie Starks, MHS 03/07/2015 8:56 AM    [facility] [place], [place], [place]  [address] Phone: [phone]; Fax: [phone]

## 2015-03-07 ENCOUNTER — Ambulatory Visit (INDEPENDENT_AMBULATORY_CARE_PROVIDER_SITE_OTHER): Payer: Medicare Other | Admitting: Physician Assistant

## 2015-03-07 ENCOUNTER — Telehealth: Payer: Self-pay | Admitting: *Deleted

## 2015-03-07 ENCOUNTER — Encounter: Payer: Self-pay | Admitting: Physician Assistant

## 2015-03-07 ENCOUNTER — Ambulatory Visit (INDEPENDENT_AMBULATORY_CARE_PROVIDER_SITE_OTHER): Payer: Medicare Other | Admitting: *Deleted

## 2015-03-07 VITALS — BP 120/78 | HR 73 | Ht 75.5 in | Wt 256.1 lb

## 2015-03-07 DIAGNOSIS — E785 Hyperlipidemia, unspecified: Secondary | ICD-10-CM

## 2015-03-07 DIAGNOSIS — I5022 Chronic systolic (congestive) heart failure: Secondary | ICD-10-CM | POA: Diagnosis not present

## 2015-03-07 DIAGNOSIS — I10 Essential (primary) hypertension: Secondary | ICD-10-CM

## 2015-03-07 DIAGNOSIS — E1165 Type 2 diabetes mellitus with hyperglycemia: Secondary | ICD-10-CM

## 2015-03-07 DIAGNOSIS — I4892 Unspecified atrial flutter: Secondary | ICD-10-CM | POA: Diagnosis not present

## 2015-03-07 DIAGNOSIS — I2510 Atherosclerotic heart disease of native coronary artery without angina pectoris: Secondary | ICD-10-CM | POA: Diagnosis not present

## 2015-03-07 DIAGNOSIS — I429 Cardiomyopathy, unspecified: Secondary | ICD-10-CM | POA: Diagnosis not present

## 2015-03-07 DIAGNOSIS — IMO0002 Reserved for concepts with insufficient information to code with codable children: Secondary | ICD-10-CM

## 2015-03-07 DIAGNOSIS — E1140 Type 2 diabetes mellitus with diabetic neuropathy, unspecified: Secondary | ICD-10-CM

## 2015-03-07 DIAGNOSIS — I350 Nonrheumatic aortic (valve) stenosis: Secondary | ICD-10-CM

## 2015-03-07 DIAGNOSIS — Z95810 Presence of automatic (implantable) cardiac defibrillator: Secondary | ICD-10-CM

## 2015-03-07 DIAGNOSIS — Z5181 Encounter for therapeutic drug level monitoring: Secondary | ICD-10-CM

## 2015-03-07 LAB — BASIC METABOLIC PANEL
BUN: 28 mg/dL — ABNORMAL HIGH (ref 6–23)
CALCIUM: 9.2 mg/dL (ref 8.4–10.5)
CO2: 25 mEq/L (ref 19–32)
CREATININE: 1.51 mg/dL — AB (ref 0.40–1.50)
Chloride: 104 mEq/L (ref 96–112)
GFR: 59.85 mL/min — ABNORMAL LOW (ref >60.00)
Glucose, Bld: 167 mg/dL — ABNORMAL HIGH (ref 70–99)
Potassium: 4.4 mEq/L (ref 3.5–5.1)
Sodium: 138 mEq/L (ref 135–145)

## 2015-03-07 LAB — POCT INR: INR: 1.7

## 2015-03-07 NOTE — Patient Instructions (Signed)
Medication Instructions:  CALL PHARMACY TO CHECK PRICE OF ATORVASTATIN 80 MG TABLET; IF THIS IS OK ; PLEASE CALL OFFICE [phone] SO THAT WE MAY CALL IN AN RX FOR ATORVASTATIN 80 MG .  Labwork: TODAY BMET  Testing/Procedures: Your physician has requested that you have an echocardiogram THIS IS TO BE DONE IN 4 MONTHS 1-2 WEEKS BEFORE HE SEES DR. Aundra Dubin IN 4 MONTHS. Echocardiography is a painless test that uses sound waves to create images of your heart. It provides your doctor with information about the size and shape of your heart and how well your heart's chambers and valves are working. This procedure takes approximately one hour. There are no restrictions for this procedure.   Follow-Up: 4 MONTHS WITH DR. Aundra Dubin  Any Other Special Instructions Will Be Listed Below (If Applicable). CALL [phone] IF YOU CHANGE YOUR MIND ABOUT THE SLEEP STUDY

## 2015-03-07 NOTE — Telephone Encounter (Signed)
Pt notified of lab results by phone with verbal understanding.  

## 2015-03-08 ENCOUNTER — Telehealth: Payer: Self-pay | Admitting: Cardiology

## 2015-03-08 MED ORDER — ISOSORBIDE MONONITRATE ER 30 MG PO TB24: 30.0000 mg | ORAL_TABLET | Freq: Every day | ORAL | Status: DC

## 2015-03-08 NOTE — Telephone Encounter (Signed)
New message     Pt needs refill for isosorbide to be sent to [facility] at [facility]

## 2015-03-08 NOTE — Progress Notes (Signed)
Anthony Rubio, he can see me in CHF clinic.

## 2015-03-24 ENCOUNTER — Other Ambulatory Visit: Payer: Self-pay | Admitting: Cardiology

## 2015-04-01 ENCOUNTER — Ambulatory Visit (INDEPENDENT_AMBULATORY_CARE_PROVIDER_SITE_OTHER): Payer: Medicare Other | Admitting: *Deleted

## 2015-04-01 ENCOUNTER — Telehealth: Payer: Self-pay | Admitting: Internal Medicine

## 2015-04-01 ENCOUNTER — Encounter: Payer: Self-pay | Admitting: Internal Medicine

## 2015-04-01 DIAGNOSIS — I5022 Chronic systolic (congestive) heart failure: Secondary | ICD-10-CM

## 2015-04-01 DIAGNOSIS — I429 Cardiomyopathy, unspecified: Secondary | ICD-10-CM

## 2015-04-01 NOTE — Telephone Encounter (Signed)
New message       Pt needs help checking his device from home.  Please call

## 2015-04-01 NOTE — Telephone Encounter (Signed)
Pt has touched base w/ Med[employer] tech services and they told him to call back later today. They felt his issue was two many people on the towers this morning.

## 2015-04-02 LAB — CUP PACEART REMOTE DEVICE CHECK
Battery Voltage: 3.02 V
Brady Statistic AS VS Percent: 62.75 %
Brady Statistic RA Percent Paced: 37.2 %
Date Time Interrogation Session: 20160926201054
HighPow Impedance: 304 Ohm
HighPow Impedance: 42 Ohm
HighPow Impedance: 51 Ohm
Lead Channel Impedance Value: 342 Ohm
Lead Channel Pacing Threshold Amplitude: 0.625 V
Lead Channel Pacing Threshold Pulse Width: 0.4 ms
Lead Channel Sensing Intrinsic Amplitude: 3 mV
Lead Channel Setting Pacing Amplitude: 2 V
Lead Channel Setting Sensing Sensitivity: 0.3 mV
MDC IDC MSMT LEADCHNL RA IMPEDANCE VALUE: 437 Ohm
MDC IDC MSMT LEADCHNL RA SENSING INTR AMPL: 3 mV
MDC IDC MSMT LEADCHNL RV PACING THRESHOLD AMPLITUDE: 1 V
MDC IDC MSMT LEADCHNL RV PACING THRESHOLD PULSEWIDTH: 0.4 ms
MDC IDC MSMT LEADCHNL RV SENSING INTR AMPL: 6.125 mV
MDC IDC MSMT LEADCHNL RV SENSING INTR AMPL: 6.125 mV
MDC IDC SET LEADCHNL RV PACING AMPLITUDE: 2.5 V
MDC IDC SET LEADCHNL RV PACING PULSEWIDTH: 0.4 ms
MDC IDC SET ZONE DETECTION INTERVAL: 300 ms
MDC IDC SET ZONE DETECTION INTERVAL: 340 ms
MDC IDC SET ZONE DETECTION INTERVAL: 390 ms
MDC IDC STAT BRADY AP VP PERCENT: 0.12 %
MDC IDC STAT BRADY AP VS PERCENT: 37.08 %
MDC IDC STAT BRADY AS VP PERCENT: 0.06 %
MDC IDC STAT BRADY RV PERCENT PACED: 0.17 %
Zone Setting Detection Interval: 400 ms

## 2015-04-02 NOTE — Progress Notes (Signed)
Remote ICD transmission.   

## 2015-04-04 ENCOUNTER — Ambulatory Visit (INDEPENDENT_AMBULATORY_CARE_PROVIDER_SITE_OTHER): Payer: Medicare Other | Admitting: *Deleted

## 2015-04-04 DIAGNOSIS — I4892 Unspecified atrial flutter: Secondary | ICD-10-CM | POA: Diagnosis not present

## 2015-04-04 DIAGNOSIS — Z5181 Encounter for therapeutic drug level monitoring: Secondary | ICD-10-CM

## 2015-04-04 LAB — POCT INR: INR: 1.7

## 2015-04-14 ENCOUNTER — Other Ambulatory Visit: Payer: Self-pay | Admitting: Internal Medicine

## 2015-04-16 ENCOUNTER — Encounter: Payer: Self-pay | Admitting: Cardiology

## 2015-04-22 ENCOUNTER — Other Ambulatory Visit: Payer: Self-pay | Admitting: Cardiology

## 2015-04-22 NOTE — Telephone Encounter (Signed)
Pt requesting refill

## 2015-05-02 ENCOUNTER — Ambulatory Visit (INDEPENDENT_AMBULATORY_CARE_PROVIDER_SITE_OTHER): Payer: Medicare Other | Admitting: *Deleted

## 2015-05-02 DIAGNOSIS — I4892 Unspecified atrial flutter: Secondary | ICD-10-CM

## 2015-05-02 DIAGNOSIS — Z5181 Encounter for therapeutic drug level monitoring: Secondary | ICD-10-CM

## 2015-05-02 LAB — POCT INR: INR: 5.1

## 2015-05-09 ENCOUNTER — Ambulatory Visit (INDEPENDENT_AMBULATORY_CARE_PROVIDER_SITE_OTHER): Payer: Medicare Other

## 2015-05-09 DIAGNOSIS — I4892 Unspecified atrial flutter: Secondary | ICD-10-CM

## 2015-05-09 DIAGNOSIS — Z5181 Encounter for therapeutic drug level monitoring: Secondary | ICD-10-CM | POA: Diagnosis not present

## 2015-05-09 LAB — POCT INR: INR: 3

## 2015-05-20 ENCOUNTER — Other Ambulatory Visit: Payer: Self-pay | Admitting: Internal Medicine

## 2015-05-21 DIAGNOSIS — Z23 Encounter for immunization: Secondary | ICD-10-CM | POA: Diagnosis not present

## 2015-06-06 ENCOUNTER — Ambulatory Visit (INDEPENDENT_AMBULATORY_CARE_PROVIDER_SITE_OTHER): Payer: Medicare Other | Admitting: *Deleted

## 2015-06-06 DIAGNOSIS — I4892 Unspecified atrial flutter: Secondary | ICD-10-CM

## 2015-06-06 DIAGNOSIS — Z5181 Encounter for therapeutic drug level monitoring: Secondary | ICD-10-CM

## 2015-06-06 LAB — POCT INR: INR: 4.3

## 2015-06-15 ENCOUNTER — Other Ambulatory Visit: Payer: Self-pay | Admitting: Cardiology

## 2015-06-15 ENCOUNTER — Other Ambulatory Visit: Payer: Self-pay | Admitting: Internal Medicine

## 2015-06-24 ENCOUNTER — Telehealth: Payer: Self-pay | Admitting: Cardiology

## 2015-06-24 ENCOUNTER — Ambulatory Visit (INDEPENDENT_AMBULATORY_CARE_PROVIDER_SITE_OTHER): Payer: Medicare Other | Admitting: *Deleted

## 2015-06-24 DIAGNOSIS — I429 Cardiomyopathy, unspecified: Secondary | ICD-10-CM

## 2015-06-24 DIAGNOSIS — I5022 Chronic systolic (congestive) heart failure: Secondary | ICD-10-CM | POA: Diagnosis not present

## 2015-06-24 NOTE — Progress Notes (Signed)
Remote ICD transmission.   

## 2015-06-24 NOTE — Telephone Encounter (Signed)
Spoke with pt and reminded pt of remote transmission that is due today. Pt verbalized understanding.   

## 2015-06-27 ENCOUNTER — Ambulatory Visit (INDEPENDENT_AMBULATORY_CARE_PROVIDER_SITE_OTHER): Payer: Medicare Other | Admitting: *Deleted

## 2015-06-27 ENCOUNTER — Encounter: Payer: Self-pay | Admitting: Cardiology

## 2015-06-27 DIAGNOSIS — I4892 Unspecified atrial flutter: Secondary | ICD-10-CM

## 2015-06-27 DIAGNOSIS — Z5181 Encounter for therapeutic drug level monitoring: Secondary | ICD-10-CM | POA: Diagnosis not present

## 2015-06-27 LAB — CUP PACEART REMOTE DEVICE CHECK
Battery Voltage: 2.99 V
Brady Statistic AP VS Percent: 32.83 %
Brady Statistic RA Percent Paced: 32.93 %
Brady Statistic RV Percent Paced: 0.16 %
Date Time Interrogation Session: 20161219171345
HIGH POWER IMPEDANCE MEASURED VALUE: 304 Ohm
HIGH POWER IMPEDANCE MEASURED VALUE: 44 Ohm
HighPow Impedance: 52 Ohm
Implantable Lead Implant Date: 20111108
Implantable Lead Model: 6947
Lead Channel Impedance Value: 342 Ohm
Lead Channel Pacing Threshold Pulse Width: 0.4 ms
Lead Channel Sensing Intrinsic Amplitude: 3 mV
Lead Channel Sensing Intrinsic Amplitude: 5.875 mV
Lead Channel Sensing Intrinsic Amplitude: 5.875 mV
Lead Channel Setting Pacing Amplitude: 2 V
Lead Channel Setting Pacing Amplitude: 2.5 V
Lead Channel Setting Pacing Pulse Width: 0.4 ms
Lead Channel Setting Sensing Sensitivity: 0.3 mV
MDC IDC LEAD IMPLANT DT: 20111108
MDC IDC LEAD LOCATION: 753859
MDC IDC LEAD LOCATION: 753860
MDC IDC MSMT LEADCHNL RA IMPEDANCE VALUE: 456 Ohm
MDC IDC MSMT LEADCHNL RA PACING THRESHOLD AMPLITUDE: 0.625 V
MDC IDC MSMT LEADCHNL RA PACING THRESHOLD PULSEWIDTH: 0.4 ms
MDC IDC MSMT LEADCHNL RA SENSING INTR AMPL: 3 mV
MDC IDC MSMT LEADCHNL RV PACING THRESHOLD AMPLITUDE: 0.875 V
MDC IDC STAT BRADY AP VP PERCENT: 0.1 %
MDC IDC STAT BRADY AS VP PERCENT: 0.06 %
MDC IDC STAT BRADY AS VS PERCENT: 67.01 %

## 2015-06-27 LAB — POCT INR: INR: 3.3

## 2015-07-07 DIAGNOSIS — Z9289 Personal history of other medical treatment: Secondary | ICD-10-CM

## 2015-07-07 HISTORY — DX: Personal history of other medical treatment: Z92.89

## 2015-07-11 ENCOUNTER — Ambulatory Visit (HOSPITAL_COMMUNITY): Payer: Medicare Other | Attending: Cardiovascular Disease

## 2015-07-11 ENCOUNTER — Other Ambulatory Visit: Payer: Self-pay

## 2015-07-11 ENCOUNTER — Ambulatory Visit (INDEPENDENT_AMBULATORY_CARE_PROVIDER_SITE_OTHER): Payer: Medicare Other | Admitting: *Deleted

## 2015-07-11 DIAGNOSIS — Z5181 Encounter for therapeutic drug level monitoring: Secondary | ICD-10-CM | POA: Diagnosis not present

## 2015-07-11 DIAGNOSIS — F17200 Nicotine dependence, unspecified, uncomplicated: Secondary | ICD-10-CM | POA: Insufficient documentation

## 2015-07-11 DIAGNOSIS — I129 Hypertensive chronic kidney disease with stage 1 through stage 4 chronic kidney disease, or unspecified chronic kidney disease: Secondary | ICD-10-CM | POA: Diagnosis not present

## 2015-07-11 DIAGNOSIS — I2510 Atherosclerotic heart disease of native coronary artery without angina pectoris: Secondary | ICD-10-CM

## 2015-07-11 DIAGNOSIS — I517 Cardiomegaly: Secondary | ICD-10-CM | POA: Insufficient documentation

## 2015-07-11 DIAGNOSIS — N189 Chronic kidney disease, unspecified: Secondary | ICD-10-CM | POA: Insufficient documentation

## 2015-07-11 DIAGNOSIS — R29898 Other symptoms and signs involving the musculoskeletal system: Secondary | ICD-10-CM | POA: Insufficient documentation

## 2015-07-11 DIAGNOSIS — I340 Nonrheumatic mitral (valve) insufficiency: Secondary | ICD-10-CM | POA: Diagnosis not present

## 2015-07-11 DIAGNOSIS — I4892 Unspecified atrial flutter: Secondary | ICD-10-CM

## 2015-07-11 DIAGNOSIS — I071 Rheumatic tricuspid insufficiency: Secondary | ICD-10-CM | POA: Diagnosis not present

## 2015-07-11 DIAGNOSIS — I352 Nonrheumatic aortic (valve) stenosis with insufficiency: Secondary | ICD-10-CM | POA: Diagnosis not present

## 2015-07-11 DIAGNOSIS — I429 Cardiomyopathy, unspecified: Secondary | ICD-10-CM

## 2015-07-11 LAB — POCT INR: INR: 3

## 2015-07-12 ENCOUNTER — Encounter: Payer: Self-pay | Admitting: Physician Assistant

## 2015-07-12 ENCOUNTER — Telehealth: Payer: Self-pay | Admitting: *Deleted

## 2015-07-12 NOTE — Telephone Encounter (Signed)
Pt notified of echo results by phone with verbal understanding. I advised pt need to see Dr. Aundra Dubin this month per Richardson Dopp, PA. Pt asked for early AM appt. I will have [place] call pt w/appt.

## 2015-08-08 ENCOUNTER — Other Ambulatory Visit: Payer: Self-pay | Admitting: Internal Medicine

## 2015-08-08 ENCOUNTER — Ambulatory Visit (INDEPENDENT_AMBULATORY_CARE_PROVIDER_SITE_OTHER): Payer: Medicare Other | Admitting: *Deleted

## 2015-08-08 DIAGNOSIS — Z5181 Encounter for therapeutic drug level monitoring: Secondary | ICD-10-CM

## 2015-08-08 DIAGNOSIS — I4892 Unspecified atrial flutter: Secondary | ICD-10-CM | POA: Diagnosis not present

## 2015-08-08 LAB — POCT INR: INR: 1.7

## 2015-08-09 ENCOUNTER — Ambulatory Visit (HOSPITAL_COMMUNITY)
Admission: RE | Admit: 2015-08-09 | Discharge: 2015-08-09 | Disposition: A | Discharge status: Home / Self Care | Payer: Medicare Other | Source: Ambulatory Visit | Attending: Internal Medicine | Admitting: Internal Medicine

## 2015-08-09 VITALS — BP 122/74 | HR 72 | Ht 75.0 in | Wt 249.0 lb

## 2015-08-09 DIAGNOSIS — N183 Chronic kidney disease, stage 3 (moderate): Secondary | ICD-10-CM | POA: Insufficient documentation

## 2015-08-09 DIAGNOSIS — I429 Cardiomyopathy, unspecified: Secondary | ICD-10-CM | POA: Insufficient documentation

## 2015-08-09 DIAGNOSIS — I5022 Chronic systolic (congestive) heart failure: Secondary | ICD-10-CM | POA: Insufficient documentation

## 2015-08-09 DIAGNOSIS — I2583 Coronary atherosclerosis due to lipid rich plaque: Secondary | ICD-10-CM

## 2015-08-09 DIAGNOSIS — Z7982 Long term (current) use of aspirin: Secondary | ICD-10-CM | POA: Insufficient documentation

## 2015-08-09 DIAGNOSIS — I2510 Atherosclerotic heart disease of native coronary artery without angina pectoris: Secondary | ICD-10-CM | POA: Diagnosis not present

## 2015-08-09 DIAGNOSIS — I4892 Unspecified atrial flutter: Secondary | ICD-10-CM | POA: Insufficient documentation

## 2015-08-09 DIAGNOSIS — F17200 Nicotine dependence, unspecified, uncomplicated: Secondary | ICD-10-CM | POA: Insufficient documentation

## 2015-08-09 DIAGNOSIS — Z79899 Other long term (current) drug therapy: Secondary | ICD-10-CM | POA: Insufficient documentation

## 2015-08-09 DIAGNOSIS — I350 Nonrheumatic aortic (valve) stenosis: Secondary | ICD-10-CM | POA: Diagnosis not present

## 2015-08-09 DIAGNOSIS — E1122 Type 2 diabetes mellitus with diabetic chronic kidney disease: Secondary | ICD-10-CM | POA: Diagnosis not present

## 2015-08-09 DIAGNOSIS — Z7901 Long term (current) use of anticoagulants: Secondary | ICD-10-CM | POA: Insufficient documentation

## 2015-08-09 DIAGNOSIS — I129 Hypertensive chronic kidney disease with stage 1 through stage 4 chronic kidney disease, or unspecified chronic kidney disease: Secondary | ICD-10-CM | POA: Diagnosis not present

## 2015-08-09 LAB — CBC
HEMATOCRIT: 42.6 % (ref 39.0–52.0)
Hemoglobin: 13.7 g/dL (ref 13.0–17.0)
MCH: 27.5 pg (ref 26.0–34.0)
MCHC: 32.2 g/dL (ref 30.0–36.0)
MCV: 85.4 fL (ref 78.0–100.0)
PLATELETS: 170 10*3/uL (ref 150–400)
RBC: 4.99 MIL/uL (ref 4.22–5.81)
RDW: 14.9 % (ref 11.5–15.5)
WBC: 6.1 10*3/uL (ref 4.0–10.5)

## 2015-08-09 LAB — BASIC METABOLIC PANEL
Anion gap: 9 (ref 5–15)
BUN: 28 mg/dL — AB (ref 6–20)
CHLORIDE: 108 mmol/L (ref 101–111)
CO2: 24 mmol/L (ref 22–32)
CREATININE: 1.68 mg/dL — AB (ref 0.61–1.24)
Calcium: 9.3 mg/dL (ref 8.9–10.3)
GFR calc Af Amer: 48 mL/min — ABNORMAL LOW (ref >60)
GFR calc non Af Amer: 41 mL/min — ABNORMAL LOW (ref >60)
Glucose, Bld: 150 mg/dL — ABNORMAL HIGH (ref 65–99)
POTASSIUM: 5 mmol/L (ref 3.5–5.1)
Sodium: 141 mmol/L (ref 135–145)

## 2015-08-09 LAB — BRAIN NATRIURETIC PEPTIDE: B Natriuretic Peptide: 534.5 pg/mL — ABNORMAL HIGH (ref 0.0–100.0)

## 2015-08-09 MED ORDER — LOSARTAN POTASSIUM 25 MG PO TABS: 12.5000 mg | ORAL_TABLET | Freq: Every day | ORAL | Status: DC

## 2015-08-09 MED ORDER — ATORVASTATIN CALCIUM 80 MG PO TABS: 80.0000 mg | ORAL_TABLET | Freq: Every day | ORAL | Status: DC

## 2015-08-09 NOTE — Patient Instructions (Signed)
Stop Lisinopril  Start Losartan 12.5 mg (1/2 tab) daily  Stop Crestor  Start Atorvastatin 80 mg daily  Labs today  Your physician recommends that you return for a FASTING lipid profile: in 2 months  We will contact you in 3 months to schedule your next appointment.

## 2015-08-09 NOTE — Progress Notes (Signed)
Patient ID: Anthony Rubio., male   DOB: 1950/12/24, 65 y.o.   MRN: [id number] PCP: Dr. Asa Lente Cardiology: Dr. Aundra Dubin  65 yo AAM with history of CAD, mixed ischemic/nonischemic cardiomyopathy, and diabetes who presents for followup. He initially was hospitalized in 5/11 with acute decompensated CHF after several days of shortness of breath, orthopnea, and PND. He was diuresed and left/right heart catheterization was done. Left heart cath showed large, ectatic coronaries with slow flow and moderate to severe diffuse distal vessel disease (consistent with diabetes) without good interventional options. EF was 15-20% by echo. He was put on milrinone and further diuresed for several more days. He was titrated off milrinone and discharged on an oral medication regimen. He later developed atrial flutter requiring cardioversion. He remains in NSR.  Patient had Medtronic ICD placed in 11/11.  Repeat echo in 10/12 showed EF 25%.  Most recent echo in 1/17 showed EF 20-25% with severe LV dilation.   He returns for follow up. Overall feeling ok. Limited mobility due to neuropathy. Walks around the house but only goes a short distance due to balance problems.  Uses a wheelchair at other times. Denies SOB/PND/Orthopnea/CP.  Stopped lisinopril because it made him feel "weak."  Crestor is too expensive.   Optivol reviewed: Fluid index < threshold with stable impedance, rare atrial arrhythmias.   Labs (9/11): LDL 61, HDL 26, K 4.8, creatinine 1.6  Labs (10/11): K 4.3, creatinine 1.3, BNP 165  Labs (11/11): K 4.9, creatinine 1.6  Labs (12/11): K 4.8, creatinine 1.7  Labs (2/12): LDL 110, HDL 28  Labs (3/12): K 4.1, creatinine 1.7 Labs (6/12): K 4.4, creatinine 1.4, BNP 125 Labs (10/12): K 4.8, creatinine 1.4 Labs (12/12): K 4.4, creatinine 1.5, BNP 20 Labs (3/13): K 5, creatinine 1.5, LDL 43, HDL 31 Labs (8/13): K 4.2, creatinine 1.3, BNP 158 Labs (05/03/14): K 4.6 Creatinine 1.4  Labs (9/16): K 4.4, creatinine  1.5  Allergies:  1) ! Erythromycin   Past Medical History:  1. CAD: LHC (5/11) with 50-60% mid LAD, diffuse moderate distal LAD up to 60-70%, small OM2 subtotally occluded, moderate to severe diffuse distal PLOM disease, severe diffuse distal RCA, PDA, and PLV disease. Coronaries were ectatic with slow flow. Patient was started on Plavix given concern for thrombus formation in the ectatic coronaries. He stopped Plavix after starting coumadin with atrial flutter.  2. Chronic systolic CHF: Likely mixed ischemic/nonischemic. Echo (5/11) with EF 15-20%, severe global hypokinesis, mild MR, moderate diastolic dysfunction, mildly decreased RV systolic function. RHC (5/11) with mean RA 14 mmHg, PA 38/26, mean PCWP 25 mmHg, CI 1.8. Repeat echo (8/11) with EF 0000000, severe diastolic dysfunction, mild MR, RV normal size and systolic function. Per official read, cannot rule out apical clot but it does appear to be only trabeculations. TEE (9/11): EF 15% with global hypokinesis, mild to moderate MR, no LV thrombus and no LAA thrombus.  Echo (10/12): EF 25%.  Echo (1/17) with EF 20-25%, severe LV dilation, mild aortic stenosis with AVA 1.5 cm^2, moderate MR, moderately dilated RV with moderately decreased RV systolic function, PASP 50 mmHg. Medtronic ICD.  3. Type II diabetes 4. HTN  5. Smoker  6. Severe diabetic neuropathy with chronic gait dysfunction  7. History of left foot ulcer with poor healing  8. Atrial flutter s/p DCCV 9/11  9. Aortic stenosis: Mild by echo in 1/17.  Family History:  Family History of Colon CA 1st degree relative <60 9other relative)  Heart disease (grandparent)  Stroke (other relative)  dm only in secondary relatives   Social History:  Pt lives in [place]. Smoker, about 1/3 ppd.  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    Current Outpatient Prescriptions   Medication Sig Dispense Refill  . B Complex Vitamins (B COMPLEX 100 PO) Take 1 tablet by mouth daily as needed (vitamin supplement).     . carvedilol (COREG) 12.5 MG tablet TAKE ONE & ONE-HALF TABLETS BY MOUTH TWICE DAILY 90 tablet 0  . furosemide (LASIX) 40 MG tablet TAKE ONE-HALF TABLET BY MOUTH ONCE DAILY 30 tablet 0  . glipiZIDE (GLUCOTROL XL) 10 MG 24 hr tablet Take 1 tablet (10 mg total) by mouth daily. 90 tablet 3  . glucose blood (RELION PRIME TEST) test strip Use to check blood sugars twice a day Dx 250.62 100 each 11  . isosorbide mononitrate (IMDUR) 30 MG 24 hr tablet Take 1 tablet (30 mg total) by mouth daily. 30 tablet 6  . RELION PRIME TEST test strip USE TO CHECK BLOOD SUGAR TWICE A DAY. 100 each 11  . rosuvastatin (CRESTOR) 40 MG tablet Take 40 mg by mouth daily.      Marland Kitchen spironolactone (ALDACTONE) 25 MG tablet TAKE ONE TABLET BY MOUTH ONCE DAILY *NEED  OFFICE  VISIT* 30 tablet 0  . spironolactone (ALDACTONE) 25 MG tablet TAKE ONE TABLET BY MOUTH ONCE DAILY *NEED  OFFICE  VISIT* 30 tablet 0  . warfarin (COUMADIN) 5 MG tablet Take 1.5-2 tablets (7.5-10 mg total) by mouth daily. As directed by Coumadin clinic 50 tablet 3  . atorvastatin (LIPITOR) 80 MG tablet Take 1 tablet (80 mg total) by mouth daily. 30 tablet 6  . losartan (COZAAR) 25 MG tablet Take 0.5 tablets (12.5 mg total) by mouth at bedtime. 15 tablet 3   No current facility-administered medications for this encounter.    BP 122/74 mmHg  Pulse 72  Ht 6\' 3"  (1.905 m)  Wt 249 lb (112.946 kg)  BMI 31.12 kg/m2  SpO2 100% General: Well developed, well nourished, in no acute distress.  Neck: Neck supple, no JVD. No masses, thyromegaly or abnormal cervical nodes.  Lungs: Clear bilaterally to auscultation and percussion.  Heart: Non-displaced PMI, chest non-tender; regular rate and rhythm, S1, S2 without murmurs, rubs or gallops. Carotid upstroke normal, no bruit. I am unable to palpate his pedal pulses but feet are warm.  No edema, no varicosities.  Abdomen: Bowel sounds positive; abdomen soft and non-tender without masses, organomegaly, or hernias noted. No hepatosplenomegaly.  Extremities: No clubbing or cyanosis.  Neurologic: Alert and oriented x 3.  Psych: Normal affect.  Assessment/Plan:  ATRIAL FLUTTER  Maintaining NSR. Continue warfarin. Will need to consider ablation if it recurs.  CHRONIC KIDNEY DISEASE STAGE III  Renal function has been stable. Likely component of diabetic nephropathy.  Chronic systolic heart failure  Mixed ischemic/nonischemic cardiomyopathy. Has Medtronic ICD.  Last echo in 1/17 with EF 20-25%, severely dilated LV, moderately dilated RV with moderately decreased RV systolic function. He has significant distal vessel CAD as well as a history of poorly controlled diabetes. He has NYHA class II symptoms.   - Continue current Coreg.  - He needs to start on losartan 12.5 mg qhs (tolerated lisinopril poorly).   - Continue spironolactone 25 mg daily.  - BMET/BNP today and repeat in 2 wks.  CORONARY ARTERY DISEASE Diffuse moderate  to severe distal vessel disease on last cath with ectatic coronaries with slow flow. No interventional target.  No evidence of ischemia.  - Continue ASA 81 daily.  - He is having trouble affording Crestor.  I will have him start atorvastatin 80 mg daily instead.  Lipids/LFTs in 2 months.  SMOKING I strongly encouraged him to quit smoking.  AORTIC STENOSIS Mild on echo in in 1/17.   [name] 08/09/2015

## 2015-08-10 LAB — HEPATITIS C ANTIBODY: HCV Ab: <0.1 s/co ratio (ref 0.0–0.9)

## 2015-08-15 ENCOUNTER — Telehealth (HOSPITAL_COMMUNITY): Payer: Self-pay | Admitting: Vascular Surgery

## 2015-08-15 NOTE — Telephone Encounter (Signed)
Pt called he will not be able to come to his lab appt on 08/16/15 he wants to know if he can get it done in [address] 08/29/15.Marland Kitchen Please advise

## 2015-08-16 ENCOUNTER — Other Ambulatory Visit (HOSPITAL_COMMUNITY): Payer: Medicare Other

## 2015-08-28 ENCOUNTER — Other Ambulatory Visit: Payer: Self-pay | Admitting: Cardiology

## 2015-08-29 ENCOUNTER — Other Ambulatory Visit (INDEPENDENT_AMBULATORY_CARE_PROVIDER_SITE_OTHER): Payer: Medicare Other | Admitting: *Deleted

## 2015-08-29 ENCOUNTER — Ambulatory Visit (INDEPENDENT_AMBULATORY_CARE_PROVIDER_SITE_OTHER): Payer: Medicare Other | Admitting: *Deleted

## 2015-08-29 DIAGNOSIS — I2583 Coronary atherosclerosis due to lipid rich plaque: Secondary | ICD-10-CM | POA: Diagnosis not present

## 2015-08-29 DIAGNOSIS — I4892 Unspecified atrial flutter: Secondary | ICD-10-CM | POA: Diagnosis not present

## 2015-08-29 DIAGNOSIS — I10 Essential (primary) hypertension: Secondary | ICD-10-CM | POA: Diagnosis not present

## 2015-08-29 DIAGNOSIS — I483 Typical atrial flutter: Secondary | ICD-10-CM | POA: Diagnosis not present

## 2015-08-29 DIAGNOSIS — Z5181 Encounter for therapeutic drug level monitoring: Secondary | ICD-10-CM

## 2015-08-29 DIAGNOSIS — I2510 Atherosclerotic heart disease of native coronary artery without angina pectoris: Secondary | ICD-10-CM

## 2015-08-29 DIAGNOSIS — E785 Hyperlipidemia, unspecified: Secondary | ICD-10-CM | POA: Diagnosis not present

## 2015-08-29 LAB — BASIC METABOLIC PANEL
BUN: 29 mg/dL — AB (ref 7–25)
CHLORIDE: 105 mmol/L (ref 98–110)
CO2: 19 mmol/L — ABNORMAL LOW (ref 20–31)
Calcium: 8.9 mg/dL (ref 8.6–10.3)
Creat: 1.67 mg/dL — ABNORMAL HIGH (ref 0.70–1.25)
Glucose, Bld: 192 mg/dL — ABNORMAL HIGH (ref 65–99)
POTASSIUM: 5 mmol/L (ref 3.5–5.3)
Sodium: 139 mmol/L (ref 135–146)

## 2015-08-29 LAB — BRAIN NATRIURETIC PEPTIDE: Brain Natriuretic Peptide: 595.6 pg/mL — ABNORMAL HIGH (ref <100)

## 2015-08-29 LAB — POCT INR: INR: 1.7

## 2015-08-29 MED ORDER — WARFARIN SODIUM 5 MG PO TABS: ORAL_TABLET | ORAL | Status: DC

## 2015-08-29 NOTE — Addendum Note (Signed)
Addended by: Eulis Foster on: 08/29/2015 07:46 AM   Modules accepted: Orders

## 2015-08-29 NOTE — Addendum Note (Signed)
Addended by: Eulis Foster on: 08/29/2015 07:50 AM   Modules accepted: Orders

## 2015-08-30 ENCOUNTER — Encounter: Payer: Self-pay | Admitting: Cardiology

## 2015-09-18 ENCOUNTER — Other Ambulatory Visit: Payer: Self-pay | Admitting: Internal Medicine

## 2015-09-19 ENCOUNTER — Ambulatory Visit (INDEPENDENT_AMBULATORY_CARE_PROVIDER_SITE_OTHER): Payer: Medicare Other | Admitting: *Deleted

## 2015-09-19 DIAGNOSIS — Z5181 Encounter for therapeutic drug level monitoring: Secondary | ICD-10-CM

## 2015-09-19 DIAGNOSIS — I4892 Unspecified atrial flutter: Secondary | ICD-10-CM

## 2015-09-19 DIAGNOSIS — I483 Typical atrial flutter: Secondary | ICD-10-CM

## 2015-09-19 LAB — POCT INR: INR: 2.9

## 2015-09-23 ENCOUNTER — Ambulatory Visit (INDEPENDENT_AMBULATORY_CARE_PROVIDER_SITE_OTHER): Payer: Medicare Other | Admitting: *Deleted

## 2015-09-23 DIAGNOSIS — I429 Cardiomyopathy, unspecified: Secondary | ICD-10-CM | POA: Diagnosis not present

## 2015-09-23 DIAGNOSIS — I5022 Chronic systolic (congestive) heart failure: Secondary | ICD-10-CM | POA: Diagnosis not present

## 2015-09-24 NOTE — Progress Notes (Signed)
Remote ICD transmission.   

## 2015-10-03 ENCOUNTER — Telehealth: Payer: Self-pay | Admitting: *Deleted

## 2015-10-03 NOTE — Telephone Encounter (Signed)
I called pt- he states he received his 2016-17 flu vaccine in December 2016. See immunizations.

## 2015-10-11 ENCOUNTER — Other Ambulatory Visit: Payer: Self-pay | Admitting: Cardiology

## 2015-10-11 ENCOUNTER — Other Ambulatory Visit: Payer: Self-pay | Admitting: Internal Medicine

## 2015-10-17 ENCOUNTER — Ambulatory Visit (INDEPENDENT_AMBULATORY_CARE_PROVIDER_SITE_OTHER): Payer: Medicare Other | Admitting: *Deleted

## 2015-10-17 DIAGNOSIS — Z5181 Encounter for therapeutic drug level monitoring: Secondary | ICD-10-CM

## 2015-10-17 DIAGNOSIS — I483 Typical atrial flutter: Secondary | ICD-10-CM

## 2015-10-17 DIAGNOSIS — I4892 Unspecified atrial flutter: Secondary | ICD-10-CM | POA: Diagnosis not present

## 2015-10-17 LAB — POCT INR: INR: 1.9

## 2015-10-23 ENCOUNTER — Encounter: Payer: Self-pay | Admitting: Cardiology

## 2015-10-23 LAB — CUP PACEART REMOTE DEVICE CHECK
Battery Voltage: 2.98 V
Brady Statistic AS VP Percent: 0.07 %
Brady Statistic AS VS Percent: 57.97 %
HighPow Impedance: 247 Ohm
HighPow Impedance: 41 Ohm
HighPow Impedance: 48 Ohm
Implantable Lead Implant Date: 20111108
Implantable Lead Location: 753859
Implantable Lead Model: 6947
Lead Channel Impedance Value: 437 Ohm
Lead Channel Pacing Threshold Amplitude: 1 V
Lead Channel Pacing Threshold Pulse Width: 0.4 ms
Lead Channel Sensing Intrinsic Amplitude: 2.625 mV
Lead Channel Sensing Intrinsic Amplitude: 2.625 mV
Lead Channel Setting Pacing Pulse Width: 0.4 ms
MDC IDC LEAD IMPLANT DT: 20111108
MDC IDC LEAD LOCATION: 753860
MDC IDC MSMT LEADCHNL RA PACING THRESHOLD AMPLITUDE: 0.625 V
MDC IDC MSMT LEADCHNL RV IMPEDANCE VALUE: 323 Ohm
MDC IDC MSMT LEADCHNL RV PACING THRESHOLD PULSEWIDTH: 0.4 ms
MDC IDC MSMT LEADCHNL RV SENSING INTR AMPL: 6.125 mV
MDC IDC MSMT LEADCHNL RV SENSING INTR AMPL: 6.125 mV
MDC IDC SESS DTM: 20170320073428
MDC IDC SET LEADCHNL RA PACING AMPLITUDE: 2 V
MDC IDC SET LEADCHNL RV PACING AMPLITUDE: 2.5 V
MDC IDC SET LEADCHNL RV SENSING SENSITIVITY: 0.3 mV
MDC IDC STAT BRADY AP VP PERCENT: 0.15 %
MDC IDC STAT BRADY AP VS PERCENT: 41.81 %
MDC IDC STAT BRADY RA PERCENT PACED: 41.96 %
MDC IDC STAT BRADY RV PERCENT PACED: 0.22 %

## 2015-10-24 ENCOUNTER — Telehealth (HOSPITAL_COMMUNITY): Payer: Self-pay | Admitting: Vascular Surgery

## 2015-10-24 NOTE — Telephone Encounter (Signed)
Left pt message to make f/u appt in May

## 2015-11-14 ENCOUNTER — Ambulatory Visit (INDEPENDENT_AMBULATORY_CARE_PROVIDER_SITE_OTHER): Payer: Medicare Other | Admitting: *Deleted

## 2015-11-14 DIAGNOSIS — I483 Typical atrial flutter: Secondary | ICD-10-CM | POA: Diagnosis not present

## 2015-11-14 DIAGNOSIS — Z5181 Encounter for therapeutic drug level monitoring: Secondary | ICD-10-CM | POA: Diagnosis not present

## 2015-11-14 DIAGNOSIS — I4892 Unspecified atrial flutter: Secondary | ICD-10-CM | POA: Diagnosis not present

## 2015-11-14 LAB — POCT INR: INR: 5.3

## 2015-11-27 ENCOUNTER — Other Ambulatory Visit: Payer: Self-pay | Admitting: Cardiology

## 2015-11-27 ENCOUNTER — Other Ambulatory Visit: Payer: Self-pay | Admitting: Internal Medicine

## 2015-12-04 ENCOUNTER — Ambulatory Visit (INDEPENDENT_AMBULATORY_CARE_PROVIDER_SITE_OTHER): Payer: Medicare Other | Admitting: *Deleted

## 2015-12-04 DIAGNOSIS — I483 Typical atrial flutter: Secondary | ICD-10-CM

## 2015-12-04 DIAGNOSIS — Z5181 Encounter for therapeutic drug level monitoring: Secondary | ICD-10-CM

## 2015-12-04 DIAGNOSIS — I4892 Unspecified atrial flutter: Secondary | ICD-10-CM

## 2015-12-04 LAB — POCT INR: INR: 2.7

## 2015-12-08 ENCOUNTER — Other Ambulatory Visit: Payer: Self-pay | Admitting: Cardiology

## 2015-12-16 ENCOUNTER — Other Ambulatory Visit: Payer: Self-pay | Admitting: Cardiology

## 2015-12-17 NOTE — Telephone Encounter (Signed)
Follow-up     The pt was asked by the pharmacy to get the MD's office to send back in the prescription.    Pt c/o medication issue:  1. Name of Medication: Isorsobide  2. How are you currently taking this medication (dosage and times per day)? 30 mg po daily  3. Are you having a reaction (difficulty breathing--STAT)? no  4. What is your medication issue? The pt is out of the medication completely

## 2015-12-18 ENCOUNTER — Telehealth: Payer: Self-pay | Admitting: Cardiology

## 2015-12-18 NOTE — Telephone Encounter (Signed)
New message     The pt is asking he has a toothache and needs to see the dentist and needs a extraction done, needs to know what to do about his blood thinner. The pt has not called the dentist office yet but thinking about going.

## 2015-12-18 NOTE — Telephone Encounter (Signed)
Spoke with pt regarding his toothache & advised the pt to speak with his Dentist office regarding how many teeth would need to be extracted, a date for the procedure, & notify [facility] if the Dentist wants him to hold his Coumadin. Instructed pt that if the Dentist wants him to hold his Coumadin/Warfarin that we would have to have a clearance form for this & he verbalized understanding.  Also, the pt wrote down [facility] fax number in the case he needs to hold his Coumadin/Warfarin so he could give to the Dentist.  Pt is aware to call back to notify us of his Dentist name & to update on holding Coumadin verus not having to hold Coumadin so that we can ensure we get the clearance form.

## 2015-12-19 ENCOUNTER — Telehealth: Payer: Self-pay | Admitting: Cardiology

## 2015-12-19 ENCOUNTER — Other Ambulatory Visit (HOSPITAL_COMMUNITY): Payer: Self-pay | Admitting: *Deleted

## 2015-12-19 ENCOUNTER — Other Ambulatory Visit: Payer: Self-pay | Admitting: Pharmacist

## 2015-12-19 ENCOUNTER — Telehealth: Payer: Self-pay | Admitting: Pharmacist

## 2015-12-19 MED ORDER — AMOXICILLIN 500 MG PO TABS: ORAL_TABLET | ORAL | Status: DC

## 2015-12-19 NOTE — Telephone Encounter (Signed)
Received fax from Dr. Basilio Cairo with [facility] that pt is scheduled for single tooth extraction. Request to hold Coumadin 5 days prior and 2 days after extraction. Per protocol, do not need to hold Coumadin at all for single extraction. Faxed this back to dental office [phone].

## 2015-12-19 NOTE — Telephone Encounter (Signed)
Left message to call back  

## 2015-12-19 NOTE — Telephone Encounter (Signed)
New message      Pt is having a tooth extracted on Tuesday.  He is in pain.  Pt want to know if Dr Aundra Dubin would call in an antibiotic and pain pills to help until he can get this tooth extracted.  Please call

## 2015-12-20 ENCOUNTER — Telehealth: Payer: Self-pay | Admitting: *Deleted

## 2015-12-20 NOTE — Telephone Encounter (Signed)
amox sent in 6/15 per Oda Kilts, PA pt was advised we will not give pain meds

## 2015-12-26 NOTE — Telephone Encounter (Signed)
I spoke with pt last week and told him we could not prescribe anymore amoxicillin this was only to be taken an hour before dental procedure. I will contact patient again but we are not refilling the antibiotic a script was sent in last week for this.

## 2016-01-02 NOTE — Progress Notes (Signed)
Electrophysiology Office Note Date: 01/03/2016  ID:  Anthony Ewalt., DOB Sep 20, 1950, MRN [id number]  PCP: Gwendolyn Grant, MD Primary Cardiologist: Aundra Dubin Electrophysiologist: Allred  CC: Routine ICD follow-up  Anthony Ranft. is a 65 y.o. male seen today for Dr Rayann Heman.  He presents today for routine electrophysiology followup.  Since last being seen in our clinic, the patient reports doing reasonably well.  Activity is limited by peripheral neuropathy more than shortness of breath. He has chronic LE edema.  He denies chest pain, palpitations, dyspnea, PND, orthopnea, nausea, vomiting, dizziness, syncope, weight gain, or early satiety.  He has not had ICD shocks.   Device History: MDT dual chamber ICD implanted 2011 for ICM History of appropriate therapy: No History of AAD therapy: No   Past Medical History  Diagnosis Date  . CAD (coronary artery disease)     a. LHC (5/11) with 50-60% mid LAD, diffuse mod distal LAD up to 60-70%, small OM2 subtotally occ, mod-severe diffuse distal PLOM, severe diffuse distal RCA, PDA, and PLV  disease. Started on Plavix given concern for thrombus formation in the ectatic cors with slow flow. b. Stopped Plavix after starting Coumadin for a-flutter.  . CHF (congestive heart failure) ([place])     a. Likely mixed ICM/NICM. Echo (5/11) with EF 15-20%, severe global HK, mild MR, mod d/d, mildly decreased RV fcn. RHC (5/11) with mean RA 14 mmHg, PA 38/26, mean PCWP 25 mmHg, CI 1.8.  b. TEE (9/11): EF 15% no LV thrombus, no LAA thrombus. c. s/p ICD 11/11. d. Last echo 04/2011: LV severely dilated, EF 25%, diffuse HK, mild AS, mildly dilated LA, trivial pericardial effusion.  Marland Kitchen Uncontrolled type II diabetes mellitus ([place])   . HTN (hypertension)   . Diabetic neuropathy (HCC)     severe; with chronic gait dysfunction  . Foot ulcer, left ([place])     poor healing  . Atrial flutter ([place])     s/p DCCV 9/11  . ICD (implantable cardiac defibrillator) in place 11/11      Medtronic dual cha,ber ICD with Optivol  . Aortic stenosis     a. Mild by echo 04/2011.   . Tobacco abuse   . CKD (chronic kidney disease), stage III   . History of echocardiogram 07/2015    Echo 1/17: EF 20-25%, inf-lat AK, restrictive physio, mild AS (mean 9 mmHg), mild AI, mod MR, severe LAE, mod reduced RVSF, mild RAE, mild TR, PASP 50 mmHg   Past Surgical History  Procedure Laterality Date  . Icd implantation  2011    by [facility]    Current Outpatient Prescriptions  Medication Sig Dispense Refill  . carvedilol (COREG) 12.5 MG tablet TAKE ONE & ONE-HALF TABLETS BY MOUTH TWICE DAILY 90 tablet 0  . furosemide (LASIX) 40 MG tablet TAKE ONE-HALF TABLET BY MOUTH ONCE DAILY 30 tablet 3  . glipiZIDE (GLUCOTROL XL) 10 MG 24 hr tablet Take 1 tablet (10 mg total) by mouth daily. 90 tablet 3  . glucose blood (RELION PRIME TEST) test strip Use to check blood sugars twice a day Dx 250.62 100 each 11  . isosorbide mononitrate (IMDUR) 30 MG 24 hr tablet TAKE ONE TABLET BY MOUTH ONCE DAILY 30 tablet 0  . losartan (COZAAR) 25 MG tablet Take 0.5 tablets (12.5 mg total) by mouth at bedtime. 15 tablet 3  . RELION PRIME TEST test strip USE TO CHECK BLOOD SUGAR TWICE A DAY. 100 each 11  . spironolactone (ALDACTONE) 25 MG  tablet Take 1 tablet (25 mg total) by mouth daily. 30 tablet 3  . warfarin (COUMADIN) 5 MG tablet Take as directed by Coumadin clinic 50 tablet 3   No current facility-administered medications for this visit.    Allergies:   Erythromycin and Lisinopril   Social History: Social History   Social History  . Marital Status: Married    Spouse Name: N/A  . Number of Children: N/A  . Years of Education: N/A   Occupational History  . Retired Barrister's clerk   . Former [employer]    Social History Main Topics  . Smoking status: Current Some Day Smoker  . Smokeless tobacco: Not on file     Comment: about 1/4 ppd. Married, lives with wife. Pt lives in [employer] [place]. Retired  Barrister's clerk  . Alcohol Use: No  . Drug Use: Not on file  . Sexual Activity: Not on file   Other Topics Concern  . Not on file   Social History Narrative    Family History: Family History  Problem Relation Age of Onset  . Diabetes Paternal Grandfather     Review of Systems: All other systems reviewed and are otherwise negative except as noted above.   Physical Exam: VS:  BP 136/80 mmHg  Pulse 86  Ht 6\' 3"  (1.905 m)  Wt 245 lb (111.131 kg)  BMI 30.62 kg/m2  SpO2 98% , BMI Body mass index is 30.62 kg/(m^2).  GEN- The patient is chronically ill appearing, alert and oriented x 3 today, in a wheelchair HEENT: normocephalic, atraumatic; sclera clear, conjunctiva pink; hearing intact; oropharynx clear; neck supple  Lungs- Clear to ausculation bilaterally, normal work of breathing.  No wheezes, rales, rhonchi Heart- Regular rate and rhythm  GI- soft, non-tender, non-distended, bowel sounds present  Extremities- no clubbing, cyanosis, +dependent edema  MS- no significant deformity or atrophy Skin- warm and dry, no rash or lesion; ICD pocket well healed Psych- euthymic mood, full affect Neuro- strength and sensation are intact  ICD interrogation- reviewed in detail today,  See PACEART report  EKG:  EKG is not ordered today.  Recent Labs: 08/09/2015: Hemoglobin 13.7; Platelets 170 08/29/2015: Brain Natriuretic Peptide 595.6*; BUN 29*; Creat 1.67*; Potassium 5.0; Sodium 139   Wt Readings from Last 3 Encounters:  01/03/16 245 lb (111.131 kg)  08/09/15 249 lb (112.946 kg)  03/07/15 256 lb 1.9 oz (116.175 kg)     Other studies Reviewed: Additional studies/ records that were reviewed today include: HF and Dr Jackalyn Lombard office notes  Assessment and Plan:  1.  Chronic systolic dysfunction euvolemic today Stable on an appropriate medical regimen Normal ICD function See Pace Art report No changes today Enroll in [facility] clinic  2.  HTN Stable No change required today  3.   CAD No recent ischemic symptoms  4.  Tobacco abuse Cessation recommended  Discussed for >3 minutes today   5.  Atrial flutter No recurrence by device interrogation today Continue Warfarin for CHADS2VASC of 5   Current medicines are reviewed at length with the patient today.   The patient does not have concerns regarding his medicines.  The following changes were made today:  none  Labs/ tests ordered today include: none  No orders of the defined types were placed in this encounter.     Disposition:   Follow up with ICM clinic, [facility], Dr Aundra Dubin as scheduled, Dr Rayann Heman 1 year    Signed, Chanetta Marshall, NP 01/03/2016 8:25 AM  [facility] [id number]  [address] [place] [address] [phone] (office) [phone] (fax)

## 2016-01-03 ENCOUNTER — Encounter: Payer: Self-pay | Admitting: Nurse Practitioner

## 2016-01-03 ENCOUNTER — Ambulatory Visit (INDEPENDENT_AMBULATORY_CARE_PROVIDER_SITE_OTHER): Payer: Medicare Other | Admitting: Nurse Practitioner

## 2016-01-03 ENCOUNTER — Ambulatory Visit (INDEPENDENT_AMBULATORY_CARE_PROVIDER_SITE_OTHER): Payer: Medicare Other

## 2016-01-03 VITALS — BP 136/80 | HR 86 | Ht 75.0 in | Wt 245.0 lb

## 2016-01-03 DIAGNOSIS — I2510 Atherosclerotic heart disease of native coronary artery without angina pectoris: Secondary | ICD-10-CM

## 2016-01-03 DIAGNOSIS — Z95810 Presence of automatic (implantable) cardiac defibrillator: Secondary | ICD-10-CM

## 2016-01-03 DIAGNOSIS — I4892 Unspecified atrial flutter: Secondary | ICD-10-CM

## 2016-01-03 DIAGNOSIS — I10 Essential (primary) hypertension: Secondary | ICD-10-CM

## 2016-01-03 DIAGNOSIS — Z720 Tobacco use: Secondary | ICD-10-CM

## 2016-01-03 DIAGNOSIS — I5022 Chronic systolic (congestive) heart failure: Secondary | ICD-10-CM | POA: Diagnosis not present

## 2016-01-03 DIAGNOSIS — Z5181 Encounter for therapeutic drug level monitoring: Secondary | ICD-10-CM

## 2016-01-03 LAB — CUP PACEART INCLINIC DEVICE CHECK
Date Time Interrogation Session: 20170630095550
Implantable Lead Implant Date: 20111108
Implantable Lead Location: 753859
Implantable Lead Location: 753860
Implantable Lead Model: 6947
MDC IDC LEAD IMPLANT DT: 20111108

## 2016-01-03 LAB — POCT INR: INR: 2.7

## 2016-01-03 NOTE — Progress Notes (Signed)
Referred to ICM clinic by Chanetta Marshall, NP.   Met patient in office and explained ICM program.  He agreed to monthly calls.  He does not keep his monitor by the bedside.  I advised to keep the monitor within 4 feet so it will send automatically overnight.   He does not know if he will move it.  He provided permission to speak with wife, leave detailed message on home and cell phone.

## 2016-01-03 NOTE — Patient Instructions (Addendum)
/  Medication Instructions:  ...    If you need a refill on your cardiac medications before your next appointment, please call your pharmacy.  Labwork:   Testing/Procedures:   Follow-Up:   WITH DR [facility] NEXT AVAILBLE.Marland Kitchen  Remote monitoring is used to monitor your Pacemaker of ICD from home. This monitoring reduces the number of office visits required to check your device to one time per year. It allows [place] to keep an eye on the functioning of your device to ensure it is working properly. You are scheduled for a device check from home on 04/02/2016..you can send your transmission at any time that day. If you have a wireless device, the transmission will be sent automatically. After your physician reviews your transmission, you will receive a postcard with your next transmission date.  Your physician wants you to follow-up in: [place].Marland KitchenYou will receive a reminder letter in the mail two months in advance. If you don't receive a letter, please call our office to schedule the follow-up appointment.     Any Other Special Instructions Will Be Listed Below (If Applicable).

## 2016-01-09 ENCOUNTER — Other Ambulatory Visit (HOSPITAL_COMMUNITY): Payer: Self-pay | Admitting: Internal Medicine

## 2016-01-21 ENCOUNTER — Other Ambulatory Visit: Payer: Self-pay | Admitting: Cardiology

## 2016-02-05 ENCOUNTER — Other Ambulatory Visit: Payer: Self-pay | Admitting: Internal Medicine

## 2016-02-06 ENCOUNTER — Ambulatory Visit (INDEPENDENT_AMBULATORY_CARE_PROVIDER_SITE_OTHER): Payer: Medicare Other

## 2016-02-06 ENCOUNTER — Ambulatory Visit (INDEPENDENT_AMBULATORY_CARE_PROVIDER_SITE_OTHER): Payer: Medicare Other | Admitting: *Deleted

## 2016-02-06 ENCOUNTER — Encounter (INDEPENDENT_AMBULATORY_CARE_PROVIDER_SITE_OTHER): Payer: Self-pay

## 2016-02-06 ENCOUNTER — Other Ambulatory Visit: Payer: Self-pay | Admitting: Internal Medicine

## 2016-02-06 DIAGNOSIS — Z5181 Encounter for therapeutic drug level monitoring: Secondary | ICD-10-CM | POA: Diagnosis not present

## 2016-02-06 DIAGNOSIS — I4892 Unspecified atrial flutter: Secondary | ICD-10-CM | POA: Diagnosis not present

## 2016-02-06 DIAGNOSIS — I5022 Chronic systolic (congestive) heart failure: Secondary | ICD-10-CM | POA: Diagnosis not present

## 2016-02-06 DIAGNOSIS — Z95810 Presence of automatic (implantable) cardiac defibrillator: Secondary | ICD-10-CM

## 2016-02-06 LAB — POCT INR: INR: 3.9

## 2016-02-06 NOTE — Progress Notes (Signed)
EPIC Encounter for ICM Monitoring  Patient Name: Anthony Rubio. is a 65 y.o. male Date: 02/06/2016 Primary Care Physican: Gwendolyn Grant, MD Primary Cardiologist: Aundra Dubin Electrophysiologist: Allred Dry Weight: 250 lb  Bi-V Pacing:  98.5%       Heart Failure questions reviewed, pt asymptomatic.  Patient asked if Dr Aundra Dubin would order 1 week of Glipizide because his PCP will not refill it since he will be seeing a new PCP in the office next week.  The PCP office would not call in enough medication to last him until his new appointment.  His, PCP, Dr Asa Lente has left the practice.  Message sent to Dr Claris Gladden nurse for review.  Thoracic impedance normal.  Recommendations: No changes.  Low sodium diet education provided.  Encouraged patient to move monitor close to where he sleeps so the transmissions can be automatically.   ICM trend: 02/06/2016     Follow-up plan: ICM clinic phone appointment on 03/11/2016.  Copy of ICM check sent to device physician.   [name], RN 02/06/2016 10:22 AM

## 2016-02-10 ENCOUNTER — Telehealth (HOSPITAL_COMMUNITY): Payer: Self-pay | Admitting: *Deleted

## 2016-02-10 MED ORDER — GLIPIZIDE ER 10 MG PO TB24: 10.0000 mg | ORAL_TABLET | Freq: Every day | ORAL | 0 refills | Status: DC

## 2016-02-10 NOTE — Telephone Encounter (Signed)
-----   Message from [name], RN sent at 02/06/2016 10:31 AM EDT ----- Anthony Rubio,   Patient is asking if Dr Aundra Dubin would call in a 7 day supply of Glipizide 10 mg due to his regular PCP left the practice and the earliest appointment he could get with a new physician in the practice is next week.  He is out of Glipizide.  That PCP practice refuses to call in a 7 day supply until his appointment because this new physician has not seen him.    I explained that usually the heart physicians do not refill those scripts but I would send you the message.  Thanks!   Margarita Grizzle

## 2016-02-10 NOTE — Telephone Encounter (Signed)
Spoke w/pt he states he is completely out of Glipizide and [place] would not refill until he sees new PCP on 8/10, sent in rx for 5 tabs to last until that appt, pt very appreciative

## 2016-02-13 ENCOUNTER — Other Ambulatory Visit (INDEPENDENT_AMBULATORY_CARE_PROVIDER_SITE_OTHER): Payer: Medicare Other

## 2016-02-13 ENCOUNTER — Encounter: Payer: Self-pay | Admitting: Family

## 2016-02-13 ENCOUNTER — Ambulatory Visit (INDEPENDENT_AMBULATORY_CARE_PROVIDER_SITE_OTHER): Payer: Medicare Other | Admitting: Family

## 2016-02-13 VITALS — BP 110/74 | HR 72 | Temp 98.1°F | Resp 18 | Ht 75.0 in | Wt 249.0 lb

## 2016-02-13 DIAGNOSIS — N183 Chronic kidney disease, stage 3 unspecified: Secondary | ICD-10-CM

## 2016-02-13 DIAGNOSIS — E1165 Type 2 diabetes mellitus with hyperglycemia: Secondary | ICD-10-CM | POA: Diagnosis not present

## 2016-02-13 DIAGNOSIS — I2510 Atherosclerotic heart disease of native coronary artery without angina pectoris: Secondary | ICD-10-CM | POA: Diagnosis not present

## 2016-02-13 DIAGNOSIS — F17200 Nicotine dependence, unspecified, uncomplicated: Secondary | ICD-10-CM

## 2016-02-13 DIAGNOSIS — IMO0002 Reserved for concepts with insufficient information to code with codable children: Secondary | ICD-10-CM

## 2016-02-13 DIAGNOSIS — I2583 Coronary atherosclerosis due to lipid rich plaque: Secondary | ICD-10-CM

## 2016-02-13 DIAGNOSIS — I10 Essential (primary) hypertension: Secondary | ICD-10-CM | POA: Diagnosis not present

## 2016-02-13 DIAGNOSIS — Z23 Encounter for immunization: Secondary | ICD-10-CM

## 2016-02-13 DIAGNOSIS — E1140 Type 2 diabetes mellitus with diabetic neuropathy, unspecified: Secondary | ICD-10-CM

## 2016-02-13 DIAGNOSIS — I5022 Chronic systolic (congestive) heart failure: Secondary | ICD-10-CM | POA: Diagnosis not present

## 2016-02-13 LAB — COMPREHENSIVE METABOLIC PANEL
ALT: 22 U/L (ref 0–53)
AST: 20 U/L (ref 0–37)
Albumin: 3.9 g/dL (ref 3.5–5.2)
Alkaline Phosphatase: 66 U/L (ref 39–117)
BUN: 30 mg/dL — ABNORMAL HIGH (ref 6–23)
CALCIUM: 9 mg/dL (ref 8.4–10.5)
CHLORIDE: 107 meq/L (ref 96–112)
CO2: 23 meq/L (ref 19–32)
Creatinine, Ser: 1.86 mg/dL — ABNORMAL HIGH (ref 0.40–1.50)
GFR: 46.92 mL/min — AB (ref >60.00)
Glucose, Bld: 140 mg/dL — ABNORMAL HIGH (ref 70–99)
Potassium: 4.7 mEq/L (ref 3.5–5.1)
Sodium: 140 mEq/L (ref 135–145)
Total Bilirubin: 0.8 mg/dL (ref 0.2–1.2)
Total Protein: 7.2 g/dL (ref 6.0–8.3)

## 2016-02-13 LAB — HEMOGLOBIN A1C: HEMOGLOBIN A1C: 7.7 % — AB (ref 4.6–6.5)

## 2016-02-13 MED ORDER — GLIPIZIDE ER 10 MG PO TB24: 10.0000 mg | ORAL_TABLET | Freq: Every day | ORAL | 0 refills | Status: DC

## 2016-02-13 NOTE — Assessment & Plan Note (Signed)
Continues tobacco use with recommendation for tobacco cessation. Educated regarding continued risk for coronary artery disease, respiratory disease, and malignancies. He is not ready to quit smoking at this time. Continue to monitor.

## 2016-02-13 NOTE — Patient Instructions (Addendum)
Thank you for choosing [facility].  Summary/Instructions:  Please continue to take your medications as prescribed.  Recommend Alpha-Lipoic Acid 600 mg daily. This is available over the counter.   Please check on the price of a medication called Entresto for your heart failure.    Your prescription(s) have been submitted to your pharmacy or been printed and provided for you. Please take as directed and contact our office if you believe you are having problem(s) with the medication(s) or have any questions.  Please stop by the lab on the lower level of the building for your blood work. Your results will be released to [place] (or called to you) after review, usually within 72 hours after test completion. If any changes need to be made, you will be notified at that same time.  1. The lab is open from 7:30am to 5:30 pm Monday-Friday  2. No appointment is necessary  3. Fasting (if needed) is 6-8 hours after food and drink; black coffee  and water are okay   If your symptoms worsen or fail to improve, please contact our office for further instruction, or in case of emergency go directly to the emergency room at the closest medical facility.

## 2016-02-13 NOTE — Progress Notes (Signed)
Subjective:    Patient ID: Anthony Rubio., male    DOB: 1951/07/04, 65 y.o.   MRN: [id number]  Chief Complaint  Patient presents with  . Medication Refill    needs refill of diabetes medication    HPI:  Anthony Rubio. is a 65 y.o. male who  has a past medical history of Aortic stenosis; Atrial flutter ([place]); CAD (coronary artery disease); CHF (congestive heart failure) ([place]); CKD (chronic kidney disease), stage III; Diabetic neuropathy ([place]); Foot ulcer, left ([place]); History of echocardiogram (07/2015); HTN (hypertension); ICD (implantable cardiac defibrillator) in place (11/11); Tobacco abuse; and Uncontrolled type II diabetes mellitus ([place]). and presents today for an office follow up.   1.) Type 2 diabetes - This is a chronic problem. Currently maintained on glipizide. Reports taking the mediation as prescribed and denies adverse side effects. Blood sugar readings at home are around 165 on average. Does experience neuropathy in his bilateral feet and now starting in his finger tips. Diet includes a heart healthy diet.   Lab Results  Component Value Date   HGBA1C 7.4 (H) 05/09/2014   2.) Hypertension - This is a chronic problem. Currently managed on carvedilol, furosemide, losartan, isosorbide mononitrate. Reports taking the medication as prescribed and denies adverse effects. Blood pressures at home have been well controlled.   BP Readings from Last 3 Encounters:  02/13/16 110/74  01/03/16 136/80  08/09/15 122/74    3.) Heart failure - This is a chronic problem. Currently maintained on spironolactone, losartan, isosorbide mononitrate, furosemide, and carvedilol. Reports taking medication as prescribed and denies adverse side effects. No shortness of breath or lower extremity edema. Notes his symptoms are adequately controlled with current regimen.   Allergies  Allergen Reactions  . Erythromycin     REACTION: made toes \\T \ finger tingle  . Lisinopril Other (See Comments)   Higher doses make him feel bad     Outpatient Medications Prior to Visit  Medication Sig Dispense Refill  . carvedilol (COREG) 12.5 MG tablet TAKE ONE & ONE-HALF TABLETS BY MOUTH TWICE DAILY 90 tablet 0  . furosemide (LASIX) 40 MG tablet TAKE ONE-HALF TABLET BY MOUTH ONCE DAILY 30 tablet 3  . glucose blood (RELION PRIME TEST) test strip Use to check blood sugars twice a day Dx 250.62 100 each 11  . isosorbide mononitrate (IMDUR) 30 MG 24 hr tablet TAKE ONE TABLET BY MOUTH ONCE DAILY 30 tablet 6  . losartan (COZAAR) 25 MG tablet TAKE ONE-HALF TABLET BY MOUTH AT BEDTIME 15 tablet 10  . RELION PRIME TEST test strip USE TO CHECK BLOOD SUGAR TWICE A DAY. 100 each 11  . spironolactone (ALDACTONE) 25 MG tablet Take 1 tablet (25 mg total) by mouth daily. 30 tablet 3  . warfarin (COUMADIN) 5 MG tablet Take as directed by Coumadin clinic 50 tablet 3  . glipiZIDE (GLUCOTROL XL) 10 MG 24 hr tablet Take 1 tablet (10 mg total) by mouth daily. 5 tablet 0   No facility-administered medications prior to visit.      Past Medical History:  Diagnosis Date  . Aortic stenosis    a. Mild by echo 04/2011.   . Atrial flutter ([place])    s/p DCCV 9/11  . CAD (coronary artery disease)    a. LHC (5/11) with 50-60% mid LAD, diffuse mod distal LAD up to 60-70%, small OM2 subtotally occ, mod-severe diffuse distal PLOM, severe diffuse distal RCA, PDA, and PLV  disease. Started on Plavix given concern for  thrombus formation in the ectatic cors with slow flow. b. Stopped Plavix after starting Coumadin for a-flutter.  . CHF (congestive heart failure) ([place])    a. Likely mixed ICM/NICM. Echo (5/11) with EF 15-20%, severe global HK, mild MR, mod d/d, mildly decreased RV fcn. RHC (5/11) with mean RA 14 mmHg, PA 38/26, mean PCWP 25 mmHg, CI 1.8.  b. TEE (9/11): EF 15% no LV thrombus, no LAA thrombus. c. s/p ICD 11/11. d. Last echo 04/2011: LV severely dilated, EF 25%, diffuse HK, mild AS, mildly dilated LA, trivial pericardial  effusion.  . CKD (chronic kidney disease), stage III   . Diabetic neuropathy (HCC)    severe; with chronic gait dysfunction  . Foot ulcer, left ([place])    poor healing  . History of echocardiogram 07/2015   Echo 1/17: EF 20-25%, inf-lat AK, restrictive physio, mild AS (mean 9 mmHg), mild AI, mod MR, severe LAE, mod reduced RVSF, mild RAE, mild TR, PASP 50 mmHg  . HTN (hypertension)   . ICD (implantable cardiac defibrillator) in place 11/11   Medtronic dual cha,ber ICD with Optivol  . Tobacco abuse   . Uncontrolled type II diabetes mellitus ([place])      Past Surgical History:  Procedure Laterality Date  . ICD implantation  2011   by JA     Family History  Problem Relation Age of Onset  . Stroke Mother   . Diabetes Paternal Grandfather      Social History   Social History  . Marital status: Married    Spouse name: N/A  . Number of children: N/A  . Years of education: N/A   Occupational History  . Retired Barrister's clerk   . Former [employer]    Social History Main Topics  . Smoking status: Current Some Day Smoker    Packs/day: 0.25    Years: 33.00    Types: Cigarettes  . Smokeless tobacco: Never Used     Comment: about 1/4 ppd. Married, lives with wife. 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 Alcohol use No  . Drug use: No  . Sexual activity: Not on file   Other Topics Concern  . Not on file   Social History Narrative  . No narrative on file    Review of Systems  Constitutional: Negative for chills and fever.  Eyes:       Negative for changes in vision.  Respiratory: Negative for cough, chest tightness, shortness of breath and wheezing.   Cardiovascular: Negative for chest pain, palpitations and leg swelling.  Endocrine: Negative for polydipsia, polyphagia and polyuria.  Neurological: Positive for numbness. Negative for dizziness, weakness, light-headedness and headaches.      Objective:    BP 110/74 (BP Location: Left Arm, Patient  Position: Sitting, Cuff Size: Large)   Pulse 72   Temp 98.1 F (36.7 C) (Oral)   Resp 18   Ht 6\' 3"  (1.905 m)   Wt 249 lb (112.9 kg)   SpO2 94%   BMI 31.12 kg/m  Nursing note and vital signs reviewed.  Physical Exam  Constitutional: He is oriented to person, place, and time. He appears well-developed and well-nourished. No distress.  Cardiovascular: Normal rate, regular rhythm, normal heart sounds and intact distal pulses.   Pulmonary/Chest: Effort normal and breath sounds normal.  Musculoskeletal:  Diabetic Foot Exam - Simple   Simple Foot Form Diabetic Foot exam was performed with the following findings:  Yes  02/13/2016  9:19 AM  Visual Inspection No deformities, no ulcerations, no other skin breakdown bilaterally:  Yes Sensation Testing See comments:  Yes Pulse Check Posterior Tibialis and Dorsalis pulse intact bilaterally:  Yes Comments Neuropathy present with severely decreased sensation from malleoli  distally bilaterally.    Neurological: He is alert and oriented to person, place, and time.  Skin: Skin is warm and dry.  Psychiatric: He has a normal mood and affect. His behavior is normal. Judgment and thought content normal.       Assessment & Plan:   Problem List Items Addressed This Visit      Cardiovascular and Mediastinum   Essential hypertension    Blood pressure appears stable and below goal 140/90 with current regimen. No adverse side effects and denies worse headache of life. Continue current dosage of carvedilol, furosemide, isosorbide mononitrate, losartan, and spironolactone. Continue to monitor blood pressure at home. Continue to follow sodium diet.      CAD (coronary artery disease)    Coronary artery disease appears stable and we'll continue to monitor through risk factors including diabetes, hyperlipidemia, and hypertension.      Chronic systolic heart failure (HCC)    Heart failure appear stable and euvolemic with current regimen with no  shortness of breath or lower extremity edema. Most recent 2-D echocardiogram showed left ventricular size being severely dilated with ejection fraction of 20-25%. There was also severe diffuse hypokinesis with distinct regional wall motion. Continue current dosage of spironolactone, losartan, isosorbide mononitrate, and furosemide. Continue carvedilol for decreased risk of sudden cardiac death. Consider addition of Entresto if financially available. Follow up and changes per cardiology.        Endocrine   Type 2 diabetes, uncontrolled, with neuropathy (HCC) - Primary    Type 2 diabetes with increased blood sugars with current regimen. Obtain A1c and microalbumin. Currently maintained on losartan for CAD risk reduction. Prevnar updated today. Does have significant neuropathy with diabetic foot exam completed today. Not maintained on medications and does not wish to start. Recommend alpha-lipoic acid as a supplement. Patient declines diabetic eye exam. Continue to monitor blood sugars at home. Encouraged continue following a heart healthy diet. Continue current dosage of glipizide pending A1c results.      Relevant Medications   glipiZIDE (GLUCOTROL XL) 10 MG 24 hr tablet   Other Relevant Orders   Hemoglobin A1C   Urine Microalbumin w/creat. ratio   Comprehensive metabolic panel     Genitourinary   CHRONIC KIDNEY DISEASE STAGE III (MODERATE)    Appears stable with most recent GFR of 48 in February 2017. Obtain CMET. Continue current dosage of losartan for renal protection.         Other   SMOKER    Continues tobacco use with recommendation for tobacco cessation. Educated regarding continued risk for coronary artery disease, respiratory disease, and malignancies. He is not ready to quit smoking at this time. Continue to monitor.       Other Visit Diagnoses   None.      I am having Mr. Santis maintain his RELION PRIME TEST, glucose blood, warfarin, carvedilol, spironolactone, furosemide,  losartan, isosorbide mononitrate, and glipiZIDE.   Meds ordered this encounter  Medications  . glipiZIDE (GLUCOTROL XL) 10 MG 24 hr tablet    Sig: Take 1 tablet (10 mg total) by mouth daily.    Dispense:  30 tablet    Refill:  0    Order Specific Question:   Supervising Provider    Answer:   CRAWFORD,  ELIZABETH A [id number]     Follow-up: Return in about 3 months (around 05/15/2016), or if symptoms worsen or fail to improve.  Mauricio Po, FNP

## 2016-02-13 NOTE — Assessment & Plan Note (Signed)
Blood pressure appears stable and below goal 140/90 with current regimen. No adverse side effects and denies worse headache of life. Continue current dosage of carvedilol, furosemide, isosorbide mononitrate, losartan, and spironolactone. Continue to monitor blood pressure at home. Continue to follow sodium diet.

## 2016-02-13 NOTE — Assessment & Plan Note (Signed)
Coronary artery disease appears stable and we'll continue to monitor through risk factors including diabetes, hyperlipidemia, and hypertension.

## 2016-02-13 NOTE — Assessment & Plan Note (Addendum)
Type 2 diabetes with increased blood sugars with current regimen. Obtain A1c and microalbumin. Currently maintained on losartan for CAD risk reduction. Prevnar updated today. Does have significant neuropathy with diabetic foot exam completed today. Not maintained on medications and does not wish to start. Recommend alpha-lipoic acid as a supplement. Patient declines diabetic eye exam. Continue to monitor blood sugars at home. Encouraged continue following a heart healthy diet. Continue current dosage of glipizide pending A1c results.

## 2016-02-13 NOTE — Assessment & Plan Note (Signed)
Appears stable with most recent GFR of 48 in February 2017. Obtain CMET. Continue current dosage of losartan for renal protection.

## 2016-02-13 NOTE — Assessment & Plan Note (Addendum)
Heart failure appear stable and euvolemic with current regimen with no shortness of breath or lower extremity edema. Most recent 2-D echocardiogram showed left ventricular size being severely dilated with ejection fraction of 20-25%. There was also severe diffuse hypokinesis with distinct regional wall motion. Continue current dosage of spironolactone, losartan, isosorbide mononitrate, and furosemide. Continue carvedilol for decreased risk of sudden cardiac death. Consider addition of Entresto if financially available. Follow up and changes per cardiology.

## 2016-02-14 DIAGNOSIS — Z23 Encounter for immunization: Secondary | ICD-10-CM | POA: Diagnosis not present

## 2016-02-17 ENCOUNTER — Other Ambulatory Visit: Payer: Self-pay | Admitting: Cardiology

## 2016-03-05 ENCOUNTER — Encounter (INDEPENDENT_AMBULATORY_CARE_PROVIDER_SITE_OTHER): Payer: Self-pay

## 2016-03-05 ENCOUNTER — Ambulatory Visit (INDEPENDENT_AMBULATORY_CARE_PROVIDER_SITE_OTHER): Payer: Medicare Other | Admitting: *Deleted

## 2016-03-05 DIAGNOSIS — Z5181 Encounter for therapeutic drug level monitoring: Secondary | ICD-10-CM | POA: Diagnosis not present

## 2016-03-05 LAB — POCT INR: INR: 1.5

## 2016-03-11 ENCOUNTER — Telehealth: Payer: Self-pay | Admitting: Cardiology

## 2016-03-11 NOTE — Telephone Encounter (Signed)
Spoke with pt and reminded pt of remote transmission that is due today. Pt verbalized understanding.   

## 2016-03-13 NOTE — Progress Notes (Signed)
No ICM remote transmission received for 03/11/2016.  Next ICM remote transmission scheduled for 04/09/2016.

## 2016-03-19 ENCOUNTER — Ambulatory Visit (INDEPENDENT_AMBULATORY_CARE_PROVIDER_SITE_OTHER): Payer: Medicare Other | Admitting: Nurse Practitioner

## 2016-03-19 ENCOUNTER — Encounter: Payer: Self-pay | Admitting: Nurse Practitioner

## 2016-03-19 VITALS — BP 118/62 | HR 79 | Temp 98.3°F | Ht 75.0 in | Wt 251.0 lb

## 2016-03-19 DIAGNOSIS — J029 Acute pharyngitis, unspecified: Secondary | ICD-10-CM | POA: Diagnosis not present

## 2016-03-19 DIAGNOSIS — I2510 Atherosclerotic heart disease of native coronary artery without angina pectoris: Secondary | ICD-10-CM

## 2016-03-19 MED ORDER — PHENOL 1.4 % MT LIQD: 1.0000 | OROMUCOSAL | 0 refills | Status: DC | PRN

## 2016-03-19 MED ORDER — AMOXICILLIN 875 MG PO TABS: 875.0000 mg | ORAL_TABLET | Freq: Two times a day (BID) | ORAL | 0 refills | Status: DC

## 2016-03-19 NOTE — Progress Notes (Signed)
Pre visit review using our clinic review tool, if applicable. No additional management support is needed unless otherwise documented below in the visit note. 

## 2016-03-19 NOTE — Progress Notes (Signed)
Subjective:  Patient ID: Anthony Rubio., male    DOB: 14-Sep-1950  Age: 65 y.o. MRN: [id number]  CC: Sore Throat (Pt stated that left side of throat and left ear are in pain. Pain X last week or more. Pt took the last 4 pills of amoxicillin left over from a tooth extraction and pain was better for about 2 days. )   Sore Throat   This is a new problem. The current episode started in the past 7 days. The problem has been gradually worsening. The pain is worse on the left side. There has been no fever. The pain is at a severity of 8/10. The pain is severe. Associated symptoms include ear pain, a plugged ear sensation and swollen glands. Pertinent negatives include no congestion, coughing, drooling, ear discharge, hoarse voice, neck pain, shortness of breath, stridor, trouble swallowing or vomiting. He has tried acetaminophen and gargles (2doses of amoxicillin) for the symptoms. The treatment provided mild relief.    Outpatient Medications Prior to Visit  Medication Sig Dispense Refill  . carvedilol (COREG) 12.5 MG tablet TAKE ONE AND ONE-HALF TABLET BY MOUTH TWICE DAILY 90 tablet 3  . furosemide (LASIX) 40 MG tablet TAKE ONE-HALF TABLET BY MOUTH ONCE DAILY 30 tablet 3  . glipiZIDE (GLUCOTROL XL) 10 MG 24 hr tablet Take 1 tablet (10 mg total) by mouth daily. 30 tablet 0  . glucose blood (RELION PRIME TEST) test strip Use to check blood sugars twice a day Dx 250.62 100 each 11  . isosorbide mononitrate (IMDUR) 30 MG 24 hr tablet TAKE ONE TABLET BY MOUTH ONCE DAILY 30 tablet 6  . losartan (COZAAR) 25 MG tablet TAKE ONE-HALF TABLET BY MOUTH AT BEDTIME 15 tablet 10  . RELION PRIME TEST test strip USE TO CHECK BLOOD SUGAR TWICE A DAY. 100 each 11  . spironolactone (ALDACTONE) 25 MG tablet Take 1 tablet (25 mg total) by mouth daily. 30 tablet 3  . warfarin (COUMADIN) 5 MG tablet Take as directed by Coumadin clinic 50 tablet 3   No facility-administered medications prior to visit.     ROS See  HPI  Objective:  BP 118/62 (BP Location: Left Arm, Patient Position: Sitting, Cuff Size: Large)   Pulse 79   Temp 98.3 F (36.8 C) (Oral)   Ht 6\' 3"  (1.905 m)   Wt 251 lb (113.9 kg)   SpO2 97%   BMI 31.37 kg/m   BP Readings from Last 3 Encounters:  03/19/16 118/62  02/13/16 110/74  01/03/16 136/80    Wt Readings from Last 3 Encounters:  03/19/16 251 lb (113.9 kg)  02/13/16 249 lb (112.9 kg)  01/03/16 245 lb (111.1 kg)    Physical Exam  Constitutional: He is oriented to person, place, and time. No distress.  HENT:  Right Ear: Tympanic membrane, external ear and ear canal normal.  Left Ear: Tympanic membrane, external ear and ear canal normal.  Nose: Nose normal.  Mouth/Throat: Uvula is midline. No trismus in the jaw. Posterior oropharyngeal erythema present. No oropharyngeal exudate or posterior oropharyngeal edema.  Eyes: Conjunctivae and EOM are normal. Pupils are equal, round, and reactive to light.  Neck: Normal range of motion. Neck supple. No thyromegaly present.  Cardiovascular: Normal rate.   Pulmonary/Chest: Effort normal. No stridor.  Lymphadenopathy:    He has cervical adenopathy.  Neurological: He is alert and oriented to person, place, and time.  Vitals reviewed.   Lab Results  Component Value Date   WBC 6.1  08/09/2015   HGB 13.7 08/09/2015   HCT 42.6 08/09/2015   PLT 170 08/09/2015   GLUCOSE 140 (H) 02/13/2016   CHOL 132 05/03/2014   TRIG 78.0 05/03/2014   HDL 27.40 (L) 05/03/2014   LDLCALC 89 05/03/2014   ALT 22 02/13/2016   AST 20 02/13/2016   NA 140 02/13/2016   K 4.7 02/13/2016   CL 107 02/13/2016   CREATININE 1.86 (H) 02/13/2016   BUN 30 (H) 02/13/2016   CO2 23 02/13/2016   TSH 1.54 10/23/2013   INR 1.5 03/05/2016   HGBA1C 7.7 (H) 02/13/2016   MICROALBUR 1.2 10/23/2013    No results found.  Assessment & Plan:   Anthony Rubio was seen today for sore throat.  Diagnoses and all orders for this visit:  Acute pharyngitis, unspecified  etiology -     amoxicillin (AMOXIL) 875 MG tablet; Take 1 tablet (875 mg total) by mouth 2 (two) times daily. With food -     phenol (CHLORASEPTIC) 1.4 % LIQD; Use as directed 1 spray in the mouth or throat as needed for throat irritation / pain.   I am having Anthony Rubio start on amoxicillin and phenol. I am also having him maintain his RELION PRIME TEST, glucose blood, warfarin, spironolactone, furosemide, losartan, isosorbide mononitrate, glipiZIDE, and carvedilol.  Meds ordered this encounter  Medications  . amoxicillin (AMOXIL) 875 MG tablet    Sig: Take 1 tablet (875 mg total) by mouth 2 (two) times daily. With food    Dispense:  14 tablet    Refill:  0    Order Specific Question:   Supervising Provider    Answer:   Anthony Rubio [[id number]]  . phenol (CHLORASEPTIC) 1.4 % LIQD    Sig: Use as directed 1 spray in the mouth or throat as needed for throat irritation / pain.    Dispense:  177 mL    Refill:  0    Order Specific Question:   Supervising Provider    Answer:   Anthony Rubio [[id number]]    Follow-up: Return if symptoms worsen or fail to improve.  Anthony Lacy, NP

## 2016-03-19 NOTE — Patient Instructions (Signed)

## 2016-03-23 ENCOUNTER — Other Ambulatory Visit: Payer: Self-pay | Admitting: Family

## 2016-03-23 DIAGNOSIS — IMO0002 Reserved for concepts with insufficient information to code with codable children: Secondary | ICD-10-CM

## 2016-03-23 DIAGNOSIS — E1165 Type 2 diabetes mellitus with hyperglycemia: Principal | ICD-10-CM

## 2016-03-23 DIAGNOSIS — E1140 Type 2 diabetes mellitus with diabetic neuropathy, unspecified: Secondary | ICD-10-CM

## 2016-04-02 ENCOUNTER — Ambulatory Visit (INDEPENDENT_AMBULATORY_CARE_PROVIDER_SITE_OTHER): Payer: Medicare Other | Admitting: *Deleted

## 2016-04-02 DIAGNOSIS — Z5181 Encounter for therapeutic drug level monitoring: Secondary | ICD-10-CM | POA: Diagnosis not present

## 2016-04-02 LAB — POCT INR: INR: 4

## 2016-04-09 ENCOUNTER — Ambulatory Visit (INDEPENDENT_AMBULATORY_CARE_PROVIDER_SITE_OTHER): Payer: Medicare Other | Admitting: *Deleted

## 2016-04-09 ENCOUNTER — Telehealth: Payer: Self-pay

## 2016-04-09 ENCOUNTER — Telehealth: Payer: Self-pay | Admitting: Cardiology

## 2016-04-09 ENCOUNTER — Telehealth: Payer: Self-pay | Admitting: Internal Medicine

## 2016-04-09 DIAGNOSIS — I255 Ischemic cardiomyopathy: Secondary | ICD-10-CM

## 2016-04-09 DIAGNOSIS — I5022 Chronic systolic (congestive) heart failure: Secondary | ICD-10-CM

## 2016-04-09 DIAGNOSIS — Z95810 Presence of automatic (implantable) cardiac defibrillator: Secondary | ICD-10-CM

## 2016-04-09 NOTE — Telephone Encounter (Signed)
Remote ICM transmission received.  Attempted patient call and left detailed message regarding transmission.  Advised to return call for any fluid symptoms or questions.

## 2016-04-09 NOTE — Telephone Encounter (Signed)
Spoke with pt and reminded pt of remote transmission that is due today. Pt verbalized understanding.   

## 2016-04-09 NOTE — Progress Notes (Signed)
EPIC Encounter for ICM Monitoring  Patient Name: Anthony Rubio. is a 65 y.o. male Date: 04/09/2016 Primary Care Physican: Mauricio Po, [place] Primary Cardiologist: Aundra Dubin Electrophysiologist: Allred Dry Weight:    unknown  Bi-V Pacing:  98.5%           Attempted ICM call and unable to reach.  Transmission reviewed.   Thoracic impedance returned to normal today, 04/09/2016.  Impedance has been abnormal suggesting fluid accumulation 03/05/2016 to 04/08/2016.  Follow-up plan: ICM clinic phone appointment on 05/13/2016.  Copy of ICM check sent to primary cardiologist and device physician.   ICM trend: 04/09/2016       [name], RN 04/09/2016 4:23 PM

## 2016-04-09 NOTE — Telephone Encounter (Signed)
New Message  Pt returning RN call. Please call back to discuss

## 2016-04-09 NOTE — Progress Notes (Signed)
Remote ICD transmission.   

## 2016-04-10 ENCOUNTER — Encounter: Payer: Self-pay | Admitting: Cardiology

## 2016-04-10 NOTE — Progress Notes (Addendum)
Patient returned call and stated he had a little ankle swelling in the last 2 weeks but has resolved in the last 2 days.   He stated he is feeling fine now.  He thinks he ate foods that were too salty.  He reported his monitor is downstairs due to he does not usually sleep in the bed.  He sometimes sleeps in a sleeping bag on the floor but is not close to his monitor.  Advised to send next ICM remote transmission on 05/13/2016.  He stated he would do so.

## 2016-04-19 LAB — CUP PACEART REMOTE DEVICE CHECK
Brady Statistic AP VP Percent: 0.55 %
Brady Statistic AP VS Percent: 53.99 %
Brady Statistic AS VP Percent: 0.1 %
Brady Statistic RA Percent Paced: 54.54 %
Brady Statistic RV Percent Paced: 0.66 %
Date Time Interrogation Session: 20171005155133
HIGH POWER IMPEDANCE MEASURED VALUE: 266 Ohm
HIGH POWER IMPEDANCE MEASURED VALUE: 40 Ohm
HIGH POWER IMPEDANCE MEASURED VALUE: 47 Ohm
Implantable Lead Implant Date: 20111108
Implantable Lead Location: 753860
Implantable Lead Model: 5076
Implantable Lead Model: 6947
Lead Channel Impedance Value: 323 Ohm
Lead Channel Pacing Threshold Amplitude: 0.875 V
Lead Channel Pacing Threshold Pulse Width: 0.4 ms
Lead Channel Sensing Intrinsic Amplitude: 5.5 mV
Lead Channel Sensing Intrinsic Amplitude: 5.5 mV
Lead Channel Setting Pacing Amplitude: 2.5 V
Lead Channel Setting Pacing Pulse Width: 0.4 ms
Lead Channel Setting Sensing Sensitivity: 0.3 mV
MDC IDC LEAD IMPLANT DT: 20111108
MDC IDC LEAD LOCATION: 753859
MDC IDC MSMT BATTERY VOLTAGE: 2.9 V
MDC IDC MSMT LEADCHNL RA IMPEDANCE VALUE: 456 Ohm
MDC IDC MSMT LEADCHNL RA PACING THRESHOLD AMPLITUDE: 0.75 V
MDC IDC MSMT LEADCHNL RA PACING THRESHOLD PULSEWIDTH: 0.4 ms
MDC IDC MSMT LEADCHNL RA SENSING INTR AMPL: 2.75 mV
MDC IDC MSMT LEADCHNL RA SENSING INTR AMPL: 2.75 mV
MDC IDC SET LEADCHNL RA PACING AMPLITUDE: 2 V
MDC IDC STAT BRADY AS VS PERCENT: 45.35 %

## 2016-04-30 ENCOUNTER — Ambulatory Visit (INDEPENDENT_AMBULATORY_CARE_PROVIDER_SITE_OTHER): Payer: Medicare Other | Admitting: *Deleted

## 2016-04-30 DIAGNOSIS — Z5181 Encounter for therapeutic drug level monitoring: Secondary | ICD-10-CM

## 2016-04-30 LAB — POCT INR: INR: 2

## 2016-05-06 ENCOUNTER — Other Ambulatory Visit: Payer: Self-pay | Admitting: Cardiology

## 2016-05-07 DIAGNOSIS — Z23 Encounter for immunization: Secondary | ICD-10-CM | POA: Diagnosis not present

## 2016-05-13 ENCOUNTER — Ambulatory Visit (INDEPENDENT_AMBULATORY_CARE_PROVIDER_SITE_OTHER): Payer: Medicare Other

## 2016-05-13 ENCOUNTER — Telehealth: Payer: Self-pay | Admitting: Cardiology

## 2016-05-13 DIAGNOSIS — Z95810 Presence of automatic (implantable) cardiac defibrillator: Secondary | ICD-10-CM | POA: Diagnosis not present

## 2016-05-13 DIAGNOSIS — I5022 Chronic systolic (congestive) heart failure: Secondary | ICD-10-CM | POA: Diagnosis not present

## 2016-05-13 NOTE — Progress Notes (Signed)
EPIC Encounter for ICM Monitoring  Patient Name: Anthony Rubio. is a 65 y.o. male Date: 05/13/2016 Primary Care Physican: Mauricio Po, [place] Primary [place] Electrophysiologist: Allred Dry Weight:249 lbs      Heart Failure questions reviewed, pt asymptomatic today.  He stated he did have some phlegm a couple of weeks ago but has resolved.   Thoracic impedance normal since 05/06/2016.  Impedance abnormal suggesting fluid accumulation from 10/15 to 11/1 and patient reported he did not take Furosemide x 3 days due to he ran out of the medication during that time.  Advised not to skip doses since it increases risk for HF exacerbation.  Recommendations: No changes.  Advised to limit salt intake to 2000 mg daily and reminded him holiday foods will be very high in salt content.  Encouraged to call for fluid symptoms.    Follow-up plan: ICM clinic phone appointment on 06/15/2016.  Copy of ICM check sent to physician.   ICM trend: 05/13/2016       [name], RN 05/13/2016 2:36 PM

## 2016-05-13 NOTE — Telephone Encounter (Signed)
Spoke with pt and reminded pt of remote transmission that is due today. Pt verbalized understanding.   

## 2016-05-25 ENCOUNTER — Other Ambulatory Visit: Payer: Self-pay | Admitting: Internal Medicine

## 2016-05-27 ENCOUNTER — Ambulatory Visit (INDEPENDENT_AMBULATORY_CARE_PROVIDER_SITE_OTHER): Payer: Medicare Other | Admitting: *Deleted

## 2016-05-27 DIAGNOSIS — Z5181 Encounter for therapeutic drug level monitoring: Secondary | ICD-10-CM | POA: Diagnosis not present

## 2016-05-27 DIAGNOSIS — I255 Ischemic cardiomyopathy: Secondary | ICD-10-CM

## 2016-05-27 LAB — POCT INR: INR: 1.9

## 2016-06-15 ENCOUNTER — Ambulatory Visit (INDEPENDENT_AMBULATORY_CARE_PROVIDER_SITE_OTHER): Payer: Medicare Other

## 2016-06-15 DIAGNOSIS — I5022 Chronic systolic (congestive) heart failure: Secondary | ICD-10-CM | POA: Diagnosis not present

## 2016-06-15 DIAGNOSIS — Z95810 Presence of automatic (implantable) cardiac defibrillator: Secondary | ICD-10-CM

## 2016-06-16 ENCOUNTER — Telehealth: Payer: Self-pay

## 2016-06-16 NOTE — Progress Notes (Signed)
EPIC Encounter for ICM Monitoring  Patient Name: Anthony Rubio. is a 65 y.o. male Date: 06/16/2016 Primary Care Physican: Mauricio Po, [place] Primary [place] Electrophysiologist: Allred Dry Weight:unknown      Attempted ICM call and unable to reach and no voice mail box set up. Transmission reviewed.   Thoracic impedance normal.  Recommendations:  NONE - unable to reach.    Follow-up plan: ICM clinic phone appointment on 07/20/2016.  Copy of ICM check sent to device physician.   ICM trend: 06/15/2016       [name], RN 06/16/2016 1:48 PM

## 2016-06-16 NOTE — Telephone Encounter (Signed)
Remote ICM transmission received.  Attempted patient call and voice mail box not set up

## 2016-06-25 ENCOUNTER — Ambulatory Visit (INDEPENDENT_AMBULATORY_CARE_PROVIDER_SITE_OTHER): Payer: Medicare Other | Admitting: *Deleted

## 2016-06-25 ENCOUNTER — Encounter (INDEPENDENT_AMBULATORY_CARE_PROVIDER_SITE_OTHER): Payer: Self-pay

## 2016-06-25 DIAGNOSIS — Z5181 Encounter for therapeutic drug level monitoring: Secondary | ICD-10-CM | POA: Diagnosis not present

## 2016-06-25 DIAGNOSIS — I255 Ischemic cardiomyopathy: Secondary | ICD-10-CM

## 2016-06-25 LAB — POCT INR: INR: 3.9

## 2016-07-20 ENCOUNTER — Telehealth: Payer: Self-pay

## 2016-07-20 ENCOUNTER — Ambulatory Visit (INDEPENDENT_AMBULATORY_CARE_PROVIDER_SITE_OTHER): Payer: Medicare Other | Admitting: *Deleted

## 2016-07-20 DIAGNOSIS — I5022 Chronic systolic (congestive) heart failure: Secondary | ICD-10-CM | POA: Diagnosis not present

## 2016-07-20 DIAGNOSIS — I255 Ischemic cardiomyopathy: Secondary | ICD-10-CM

## 2016-07-20 DIAGNOSIS — Z95810 Presence of automatic (implantable) cardiac defibrillator: Secondary | ICD-10-CM

## 2016-07-20 NOTE — Telephone Encounter (Signed)
error 

## 2016-07-20 NOTE — Telephone Encounter (Signed)
Remote ICM transmission received.  Attempted patient call and left detailed message regarding transmission and next ICM scheduled for 08/20/2016.  Advised to return call for any fluid symptoms or questions.

## 2016-07-20 NOTE — Progress Notes (Signed)
Patient returned call.  He stated he is doing well and denied any fluid symptoms.  Reviewed transmission.  Encouraged to call for any fluid symptoms.  No changes today.  Next ICM remote transmission 08/20/2016.

## 2016-07-20 NOTE — Progress Notes (Signed)
EPIC Encounter for ICM Monitoring  Patient Name: Anthony Rubio. is a 66 y.o. male Date: 07/20/2016 Primary Care Physican: Mauricio Po, [place] Primary [place] Electrophysiologist: Allred Dry Weight:unknown                         Attempted ICM call and unable to reach.  Left detailed message regarding transmission.  Transmission reviewed.   Thoracic impedance normal   Recommendations: Provided ICM number and encouraged to call for fluid symptoms.  Follow-up plan: ICM clinic phone appointment on 08/20/2016.  Copy of ICM check sent to device physician.   3 month ICM trend: 07/20/2016   1 Year ICM trend:      [name], RN 07/20/2016 10:01 AM

## 2016-07-20 NOTE — Progress Notes (Signed)
Remote ICD transmission.   

## 2016-07-24 LAB — CUP PACEART REMOTE DEVICE CHECK
Battery Voltage: 2.82 V
Brady Statistic AP VP Percent: 0.27 %
Brady Statistic AS VS Percent: 54.31 %
HighPow Impedance: 266 Ohm
HighPow Impedance: 41 Ohm
HighPow Impedance: 53 Ohm
Implantable Lead Implant Date: 20111108
Implantable Lead Model: 6947
Implantable Pulse Generator Implant Date: 20111108
Lead Channel Impedance Value: 456 Ohm
Lead Channel Pacing Threshold Amplitude: 0.75 V
Lead Channel Pacing Threshold Pulse Width: 0.4 ms
Lead Channel Sensing Intrinsic Amplitude: 2.875 mV
Lead Channel Sensing Intrinsic Amplitude: 2.875 mV
MDC IDC LEAD IMPLANT DT: 20111108
MDC IDC LEAD LOCATION: 753859
MDC IDC LEAD LOCATION: 753860
MDC IDC MSMT LEADCHNL RV IMPEDANCE VALUE: 323 Ohm
MDC IDC MSMT LEADCHNL RV PACING THRESHOLD AMPLITUDE: 0.875 V
MDC IDC MSMT LEADCHNL RV PACING THRESHOLD PULSEWIDTH: 0.4 ms
MDC IDC MSMT LEADCHNL RV SENSING INTR AMPL: 5.375 mV
MDC IDC MSMT LEADCHNL RV SENSING INTR AMPL: 5.375 mV
MDC IDC SESS DTM: 20180115072607
MDC IDC SET LEADCHNL RA PACING AMPLITUDE: 2 V
MDC IDC SET LEADCHNL RV PACING AMPLITUDE: 2.5 V
MDC IDC SET LEADCHNL RV PACING PULSEWIDTH: 0.4 ms
MDC IDC SET LEADCHNL RV SENSING SENSITIVITY: 0.3 mV
MDC IDC STAT BRADY AP VS PERCENT: 45.3 %
MDC IDC STAT BRADY AS VP PERCENT: 0.11 %
MDC IDC STAT BRADY RA PERCENT PACED: 39.56 %
MDC IDC STAT BRADY RV PERCENT PACED: 0.37 %

## 2016-07-27 ENCOUNTER — Ambulatory Visit (INDEPENDENT_AMBULATORY_CARE_PROVIDER_SITE_OTHER): Payer: Medicare Other | Admitting: *Deleted

## 2016-07-27 DIAGNOSIS — Z5181 Encounter for therapeutic drug level monitoring: Secondary | ICD-10-CM

## 2016-07-27 DIAGNOSIS — I255 Ischemic cardiomyopathy: Secondary | ICD-10-CM | POA: Diagnosis not present

## 2016-07-27 LAB — POCT INR: INR: 2.1

## 2016-07-29 ENCOUNTER — Encounter: Payer: Self-pay | Admitting: Cardiology

## 2016-08-20 ENCOUNTER — Ambulatory Visit (INDEPENDENT_AMBULATORY_CARE_PROVIDER_SITE_OTHER): Payer: Medicare Other

## 2016-08-20 DIAGNOSIS — I5022 Chronic systolic (congestive) heart failure: Secondary | ICD-10-CM

## 2016-08-20 DIAGNOSIS — Z95810 Presence of automatic (implantable) cardiac defibrillator: Secondary | ICD-10-CM | POA: Diagnosis not present

## 2016-08-20 NOTE — Progress Notes (Signed)
EPIC Encounter for ICM Monitoring  Patient Name: Anthony Rubio. is a 66 y.o. male Date: 08/20/2016 Primary Care Physican: Mauricio Po, [place] Primary [place] Electrophysiologist: Allred Dry Weight:unknown      Heart Failure questions reviewed, pt asymptomatic    Thoracic impedance normal   Current prescribed dose of Furosemide 40 mg .5 tablet (20 mg total) daily  Recommendations: No changes. Reminded to limit dietary salt intake to 2000 mg/day and fluid intake to < 2 liters/day. Encouraged to call for fluid symptoms.  Follow-up plan: ICM clinic phone appointment on 09/22/2016.  Copy of ICM check sent to device physician.   3 month ICM trend: 08/20/2016   1 Year ICM trend:      [name], RN 08/20/2016 8:29 AM

## 2016-08-27 ENCOUNTER — Ambulatory Visit (INDEPENDENT_AMBULATORY_CARE_PROVIDER_SITE_OTHER): Payer: Medicare Other | Admitting: *Deleted

## 2016-08-27 DIAGNOSIS — Z5181 Encounter for therapeutic drug level monitoring: Secondary | ICD-10-CM

## 2016-08-27 DIAGNOSIS — I255 Ischemic cardiomyopathy: Secondary | ICD-10-CM

## 2016-08-27 LAB — POCT INR: INR: 3.9

## 2016-09-01 ENCOUNTER — Other Ambulatory Visit (HOSPITAL_COMMUNITY): Payer: Self-pay | Admitting: Internal Medicine

## 2016-09-04 ENCOUNTER — Other Ambulatory Visit (HOSPITAL_COMMUNITY): Payer: Self-pay | Admitting: Internal Medicine

## 2016-09-07 ENCOUNTER — Other Ambulatory Visit (HOSPITAL_COMMUNITY): Payer: Self-pay | Admitting: Cardiology

## 2016-09-22 ENCOUNTER — Ambulatory Visit (INDEPENDENT_AMBULATORY_CARE_PROVIDER_SITE_OTHER): Payer: Medicare Other

## 2016-09-22 DIAGNOSIS — Z95810 Presence of automatic (implantable) cardiac defibrillator: Secondary | ICD-10-CM | POA: Diagnosis not present

## 2016-09-22 DIAGNOSIS — I5022 Chronic systolic (congestive) heart failure: Secondary | ICD-10-CM

## 2016-09-22 NOTE — Progress Notes (Signed)
EPIC Encounter for ICM Monitoring  Patient Name: Anthony Rubio. is a 66 y.o. male Date: 09/22/2016 Primary Care Physican: Mauricio Po, [place] Primary [place] Electrophysiologist: Allred Dry Weight:unknown          Heart Failure questions reviewed, pt asymptomatic.  He says he feels fine and thinks he has been drinking too much fluid.    Thoracic impedance slightly below baseline.  Current prescribed dose of Furosemide 40 mg .5 tablet (20 mg total) daily  Labs: 02/13/2016 Creatinine 1.86, BUN 30, Potassium 4.7, Sodium 140  08/29/2015 Creatinine 1.67, BUN 29, Potassium 5.0, Sodium 139  08/09/2015 Creatinine 1.68, BUN 28, Potassium 5.0, Sodium 139   Recommendations: No changes. Reminded to limit dietary salt intake to 2000 mg/day and advised to limit fluid intake to < 2 liters/day. Encouraged to call for fluid symptoms.   Follow-up plan: ICM clinic phone appointment on 10/06/2016 to recheck fluid levels.  Copy of ICM check sent to primary cardiologist and device physician.   3 month ICM trend: 09/22/2016   1 Year ICM trend:      [name], RN 09/22/2016 8:37 AM

## 2016-10-06 ENCOUNTER — Ambulatory Visit (INDEPENDENT_AMBULATORY_CARE_PROVIDER_SITE_OTHER): Payer: Self-pay

## 2016-10-06 ENCOUNTER — Other Ambulatory Visit: Payer: Self-pay | Admitting: Cardiology

## 2016-10-06 DIAGNOSIS — Z95810 Presence of automatic (implantable) cardiac defibrillator: Secondary | ICD-10-CM

## 2016-10-06 DIAGNOSIS — I5022 Chronic systolic (congestive) heart failure: Secondary | ICD-10-CM

## 2016-10-06 NOTE — Progress Notes (Signed)
EPIC Encounter for ICM Monitoring  Patient Name: Anthony Rubio. is a 66 y.o. male Date: 10/06/2016 Primary Care Physican: Mauricio Po, [place] Primary [place] Electrophysiologist: Allred Dry Weight:unknown       Attempted call to patient and unable to reach.  Left detailed message regarding transmission.  Transmission reviewed.    Thoracic impedance normal.  Current prescribed dose of Furosemide 40 mg .5 tablet (20 mg total) daily  Labs: 02/13/2016 Creatinine 1.86, BUN 30, Potassium 4.7, Sodium 140  08/29/2015 Creatinine 1.67, BUN 29, Potassium 5.0, Sodium 139  08/09/2015 Creatinine 1.68, BUN 28, Potassium 5.0, Sodium 139   Recommendations: NONE - Unable to reach patient   Follow-up plan: ICM clinic phone appointment on 11/09/2016.    Copy of ICM check sent to device physician.   3 month ICM trend: 10/06/2016   1 Year ICM trend:      [name], RN 10/06/2016 9:18 AM

## 2016-10-09 DIAGNOSIS — R05 Cough: Secondary | ICD-10-CM | POA: Diagnosis not present

## 2016-10-09 DIAGNOSIS — J0100 Acute maxillary sinusitis, unspecified: Secondary | ICD-10-CM | POA: Diagnosis not present

## 2016-10-15 ENCOUNTER — Ambulatory Visit (INDEPENDENT_AMBULATORY_CARE_PROVIDER_SITE_OTHER): Payer: Medicare Other | Admitting: *Deleted

## 2016-10-15 DIAGNOSIS — Z5181 Encounter for therapeutic drug level monitoring: Secondary | ICD-10-CM

## 2016-10-15 DIAGNOSIS — I255 Ischemic cardiomyopathy: Secondary | ICD-10-CM

## 2016-10-15 LAB — POCT INR: INR: 4.3

## 2016-11-04 ENCOUNTER — Ambulatory Visit (INDEPENDENT_AMBULATORY_CARE_PROVIDER_SITE_OTHER): Payer: Medicare Other | Admitting: Pharmacist

## 2016-11-04 DIAGNOSIS — Z5181 Encounter for therapeutic drug level monitoring: Secondary | ICD-10-CM | POA: Diagnosis not present

## 2016-11-04 DIAGNOSIS — I255 Ischemic cardiomyopathy: Secondary | ICD-10-CM

## 2016-11-04 LAB — POCT INR: INR: 1.6

## 2016-11-09 ENCOUNTER — Ambulatory Visit (INDEPENDENT_AMBULATORY_CARE_PROVIDER_SITE_OTHER): Payer: Medicare Other | Admitting: *Deleted

## 2016-11-09 DIAGNOSIS — I5022 Chronic systolic (congestive) heart failure: Secondary | ICD-10-CM | POA: Diagnosis not present

## 2016-11-09 DIAGNOSIS — I255 Ischemic cardiomyopathy: Secondary | ICD-10-CM

## 2016-11-09 DIAGNOSIS — Z95810 Presence of automatic (implantable) cardiac defibrillator: Secondary | ICD-10-CM | POA: Diagnosis not present

## 2016-11-09 NOTE — Progress Notes (Signed)
EPIC Encounter for ICM Monitoring  Patient Name: Anthony Rubio. is a 66 y.o. male Date: 11/09/2016 Primary Care Physican: Golden Circle, [place] Primary [place] Electrophysiologist: Allred Dry Weight:unknown        Heart Failure questions reviewed, pt asymptomatic.   Thoracic impedance normal.  Prescribed dose of Furosemide 40 mg .5 tablet (20 mg total) daily  Labs: 02/13/2016 Creatinine 1.86, BUN 30, Potassium 4.7, Sodium 140  08/29/2015 Creatinine 1.67, BUN 29, Potassium 5.0, Sodium 139  08/09/2015 Creatinine 1.68, BUN 28, Potassium 5.0, Sodium 139   Recommendations: No changes. Discussed to limit salt intake to 2000 mg/day and fluid intake to < 2 liters/day.  Encouraged to call for fluid symptoms or use local ER for any urgent symptoms.  Follow-up plan: ICM clinic phone appointment on 01/05/2017.  Defib office appointment scheduled on 12/02/2016 with Dr. Rayann Heman.  Copy of ICM check sent to device physician.   3 month ICM trend: 11/09/2016   1 Year ICM trend:      [name], RN 11/09/2016 10:48 AM

## 2016-11-10 LAB — CUP PACEART REMOTE DEVICE CHECK
Battery Voltage: 2.76 V
Brady Statistic AS VS Percent: 76.69 %
Brady Statistic RA Percent Paced: 23.07 %
Date Time Interrogation Session: 20180507041607
HIGH POWER IMPEDANCE MEASURED VALUE: 266 Ohm
HIGH POWER IMPEDANCE MEASURED VALUE: 41 Ohm
HighPow Impedance: 50 Ohm
Implantable Lead Implant Date: 20111108
Implantable Lead Location: 753859
Implantable Lead Location: 753860
Lead Channel Impedance Value: 323 Ohm
Lead Channel Pacing Threshold Amplitude: 0.75 V
Lead Channel Pacing Threshold Pulse Width: 0.4 ms
Lead Channel Sensing Intrinsic Amplitude: 3.125 mV
Lead Channel Setting Pacing Pulse Width: 0.4 ms
MDC IDC LEAD IMPLANT DT: 20111108
MDC IDC MSMT LEADCHNL RA IMPEDANCE VALUE: 456 Ohm
MDC IDC MSMT LEADCHNL RA SENSING INTR AMPL: 3.125 mV
MDC IDC MSMT LEADCHNL RV PACING THRESHOLD AMPLITUDE: 1 V
MDC IDC MSMT LEADCHNL RV PACING THRESHOLD PULSEWIDTH: 0.4 ms
MDC IDC MSMT LEADCHNL RV SENSING INTR AMPL: 5.375 mV
MDC IDC MSMT LEADCHNL RV SENSING INTR AMPL: 5.375 mV
MDC IDC PG IMPLANT DT: 20111108
MDC IDC SET LEADCHNL RA PACING AMPLITUDE: 2 V
MDC IDC SET LEADCHNL RV PACING AMPLITUDE: 2.5 V
MDC IDC SET LEADCHNL RV SENSING SENSITIVITY: 0.3 mV
MDC IDC STAT BRADY AP VP PERCENT: 0.06 %
MDC IDC STAT BRADY AP VS PERCENT: 23.19 %
MDC IDC STAT BRADY AS VP PERCENT: 0.06 %
MDC IDC STAT BRADY RV PERCENT PACED: 0.14 %

## 2016-11-10 NOTE — Progress Notes (Signed)
Remote ICD transmission.   

## 2016-11-11 ENCOUNTER — Encounter: Payer: Self-pay | Admitting: Internal Medicine

## 2016-11-13 ENCOUNTER — Encounter: Payer: Self-pay | Admitting: Cardiology

## 2016-11-13 ENCOUNTER — Other Ambulatory Visit: Payer: Self-pay | Admitting: Family

## 2016-11-13 DIAGNOSIS — E1165 Type 2 diabetes mellitus with hyperglycemia: Principal | ICD-10-CM

## 2016-11-13 DIAGNOSIS — IMO0002 Reserved for concepts with insufficient information to code with codable children: Secondary | ICD-10-CM

## 2016-11-13 DIAGNOSIS — E1140 Type 2 diabetes mellitus with diabetic neuropathy, unspecified: Secondary | ICD-10-CM

## 2016-12-02 ENCOUNTER — Ambulatory Visit (INDEPENDENT_AMBULATORY_CARE_PROVIDER_SITE_OTHER): Payer: Medicare Other | Admitting: *Deleted

## 2016-12-02 ENCOUNTER — Encounter (INDEPENDENT_AMBULATORY_CARE_PROVIDER_SITE_OTHER): Payer: Self-pay

## 2016-12-02 ENCOUNTER — Encounter: Payer: Self-pay | Admitting: Internal Medicine

## 2016-12-02 ENCOUNTER — Ambulatory Visit (INDEPENDENT_AMBULATORY_CARE_PROVIDER_SITE_OTHER): Payer: Medicare Other | Admitting: Internal Medicine

## 2016-12-02 VITALS — BP 90/40 | HR 66 | Ht 75.0 in | Wt 243.0 lb

## 2016-12-02 DIAGNOSIS — I483 Typical atrial flutter: Secondary | ICD-10-CM | POA: Diagnosis not present

## 2016-12-02 DIAGNOSIS — Z95810 Presence of automatic (implantable) cardiac defibrillator: Secondary | ICD-10-CM | POA: Diagnosis not present

## 2016-12-02 DIAGNOSIS — I429 Cardiomyopathy, unspecified: Secondary | ICD-10-CM

## 2016-12-02 DIAGNOSIS — I5022 Chronic systolic (congestive) heart failure: Secondary | ICD-10-CM

## 2016-12-02 DIAGNOSIS — I255 Ischemic cardiomyopathy: Secondary | ICD-10-CM | POA: Diagnosis not present

## 2016-12-02 DIAGNOSIS — Z5181 Encounter for therapeutic drug level monitoring: Secondary | ICD-10-CM | POA: Diagnosis not present

## 2016-12-02 DIAGNOSIS — I2589 Other forms of chronic ischemic heart disease: Secondary | ICD-10-CM

## 2016-12-02 LAB — CUP PACEART INCLINIC DEVICE CHECK
Battery Voltage: 2.72 V
Brady Statistic AS VP Percent: 0.12 %
Brady Statistic AS VS Percent: 48.6 %
Brady Statistic RA Percent Paced: 44.53 %
HighPow Impedance: 266 Ohm
HighPow Impedance: 43 Ohm
HighPow Impedance: 51 Ohm
Implantable Lead Implant Date: 20111108
Implantable Lead Location: 753859
Lead Channel Impedance Value: 456 Ohm
Lead Channel Pacing Threshold Amplitude: 0.75 V
Lead Channel Pacing Threshold Amplitude: 1 V
Lead Channel Pacing Threshold Pulse Width: 0.4 ms
Lead Channel Setting Pacing Amplitude: 2.5 V
Lead Channel Setting Pacing Pulse Width: 0.4 ms
MDC IDC LEAD IMPLANT DT: 20111108
MDC IDC LEAD LOCATION: 753860
MDC IDC MSMT LEADCHNL RA SENSING INTR AMPL: 2.25 mV
MDC IDC MSMT LEADCHNL RV IMPEDANCE VALUE: 342 Ohm
MDC IDC MSMT LEADCHNL RV PACING THRESHOLD PULSEWIDTH: 0.4 ms
MDC IDC MSMT LEADCHNL RV SENSING INTR AMPL: 6.125 mV
MDC IDC PG IMPLANT DT: 20111108
MDC IDC SESS DTM: 20180530091708
MDC IDC SET LEADCHNL RA PACING AMPLITUDE: 2 V
MDC IDC SET LEADCHNL RV SENSING SENSITIVITY: 0.3 mV
MDC IDC STAT BRADY AP VP PERCENT: 0.6 %
MDC IDC STAT BRADY AP VS PERCENT: 50.68 %
MDC IDC STAT BRADY RV PERCENT PACED: 0.7 %

## 2016-12-02 LAB — CBC WITH DIFFERENTIAL/PLATELET
BASOS ABS: 0 10*3/uL (ref 0.0–0.2)
Basos: 0 %
EOS (ABSOLUTE): 0.1 10*3/uL (ref 0.0–0.4)
Eos: 1 %
Hematocrit: 43.9 % (ref 37.5–51.0)
Hemoglobin: 14.4 g/dL (ref 13.0–17.7)
IMMATURE GRANS (ABS): 0 10*3/uL (ref 0.0–0.1)
IMMATURE GRANULOCYTES: 0 %
Lymphocytes Absolute: 1.6 10*3/uL (ref 0.7–3.1)
Lymphs: 25 %
MCH: 27.9 pg (ref 26.6–33.0)
MCHC: 32.8 g/dL (ref 31.5–35.7)
MCV: 85 fL (ref 79–97)
Monocytes Absolute: 0.5 10*3/uL (ref 0.1–0.9)
Monocytes: 8 %
NEUTROS PCT: 66 %
Neutrophils Absolute: 4.1 10*3/uL (ref 1.4–7.0)
Platelets: 205 10*3/uL (ref 150–379)
RBC: 5.16 x10E6/uL (ref 4.14–5.80)
RDW: 15.3 % (ref 12.3–15.4)
WBC: 6.2 10*3/uL (ref 3.4–10.8)

## 2016-12-02 LAB — BASIC METABOLIC PANEL
BUN/Creatinine Ratio: 15 (ref 10–24)
BUN: 24 mg/dL (ref 8–27)
CHLORIDE: 105 mmol/L (ref 96–106)
CO2: 18 mmol/L (ref 18–29)
Calcium: 9.2 mg/dL (ref 8.6–10.2)
Creatinine, Ser: 1.63 mg/dL — ABNORMAL HIGH (ref 0.76–1.27)
GFR calc Af Amer: 50 mL/min/{1.73_m2} — ABNORMAL LOW (ref >59)
GFR, EST NON AFRICAN AMERICAN: 43 mL/min/{1.73_m2} — AB (ref >59)
Glucose: 154 mg/dL — ABNORMAL HIGH (ref 65–99)
POTASSIUM: 4.7 mmol/L (ref 3.5–5.2)
Sodium: 139 mmol/L (ref 134–144)

## 2016-12-02 LAB — PRO B NATRIURETIC PEPTIDE: NT-PRO BNP: 5249 pg/mL — AB (ref 0–376)

## 2016-12-02 LAB — POCT INR: INR: 2.1

## 2016-12-02 NOTE — Progress Notes (Signed)
PCP: Golden Circle, FNP Primary Cardiologist:  Dr Eber Jones. is a 66 y.o. male who presents today for routine electrophysiology followup.  Since last being seen in our clinic, the patient reports doing very well.  He drives but is not very active.  Mostly watches TV or plays cards online.  He lights up when talking about his daughter (now grown) and how she used to play [employer] basketball.  He says she was very good.  Today, he denies symptoms of palpitations, chest pain, shortness of breath,  lower extremity edema, dizziness, presyncope, syncope, or ICD shocks.  The patient is otherwise without complaint today.   Past Medical History:  Diagnosis Date  . Aortic stenosis    a. Mild by echo 04/2011.   . Atrial flutter ([place])    s/p DCCV 9/11  . CAD (coronary artery disease)    a. LHC (5/11) with 50-60% mid LAD, diffuse mod distal LAD up to 60-70%, small OM2 subtotally occ, mod-severe diffuse distal PLOM, severe diffuse distal RCA, PDA, and PLV  disease. Started on Plavix given concern for thrombus formation in the ectatic cors with slow flow. b. Stopped Plavix after starting Coumadin for a-flutter.  . CHF (congestive heart failure) ([place])    a. Likely mixed ICM/NICM. Echo (5/11) with EF 15-20%, severe global HK, mild MR, mod d/d, mildly decreased RV fcn. RHC (5/11) with mean RA 14 mmHg, PA 38/26, mean PCWP 25 mmHg, CI 1.8.  b. TEE (9/11): EF 15% no LV thrombus, no LAA thrombus. c. s/p ICD 11/11. d. Last echo 04/2011: LV severely dilated, EF 25%, diffuse HK, mild AS, mildly dilated LA, trivial pericardial effusion.  . CKD (chronic kidney disease), stage III   . Diabetic neuropathy (HCC)    severe; with chronic gait dysfunction  . Foot ulcer, left ([place])    poor healing  . History of echocardiogram 07/2015   Echo 1/17: EF 20-25%, inf-lat AK, restrictive physio, mild AS (mean 9 mmHg), mild AI, mod MR, severe LAE, mod reduced RVSF, mild RAE, mild TR, PASP 50 mmHg  . HTN (hypertension)     . ICD (implantable cardiac defibrillator) in place 11/11   Medtronic dual cha,ber ICD with Optivol  . Tobacco abuse   . Uncontrolled type II diabetes mellitus ([place])    Past Surgical History:  Procedure Laterality Date  . ICD implantation  2011   by JA    ROS- all systems are reviewed and negative except as per HPI above  Current Outpatient Prescriptions  Medication Sig Dispense Refill  . atorvastatin (LIPITOR) 80 MG tablet Take 80 mg by mouth every 2-3 days    . carvedilol (COREG) 12.5 MG tablet Take 1.5 tablets by mouth once daily    . furosemide (LASIX) 40 MG tablet TAKE ONE-HALF TABLET BY MOUTH ONCE DAILY 30 tablet 0  . glipiZIDE (GLUCOTROL XL) 10 MG 24 hr tablet TAKE ONE TABLET BY MOUTH ONCE DAILY 90 tablet 0  . glucose blood (RELION PRIME TEST) test strip Use to check blood sugars twice a day Dx 250.62 100 each 11  . isosorbide mononitrate (IMDUR) 30 MG 24 hr tablet TAKE ONE TABLET BY MOUTH ONCE DAILY 30 tablet 6  . losartan (COZAAR) 25 MG tablet Take 12.5 mg by mouth 2 (two) times a week.    . phenol (CHLORASEPTIC) 1.4 % LIQD Use as directed 1 spray in the mouth or throat as needed for throat irritation / pain. 177 mL 0  .  RELION PRIME TEST test strip USE TO CHECK BLOOD SUGAR TWICE A DAY. 100 each 11  . spironolactone (ALDACTONE) 25 MG tablet TAKE ONE TABLET BY MOUTH ONCE DAILY 30 tablet 3  . warfarin (COUMADIN) 5 MG tablet TAKE AS DIRECTED BY COUMADIN CLINIC 50 tablet 3   No current facility-administered medications for this visit.     Physical Exam: Vitals:   12/02/16 0837  BP: (!) 90/40  Pulse: 66  SpO2: 94%  Weight: 243 lb (110.2 kg)  Height: 6\' 3"  (1.905 m)    GEN- The patient is well appearing, alert and oriented x 3 today.   Head- normocephalic, atraumatic Eyes-  Sclera clear, conjunctiva pink Ears- hearing intact Oropharynx- clear Lungs- Clear to ausculation bilaterally, normal work of breathing Chest- ICD pocket is well healed Heart- Regular rate and  rhythm, no murmurs, rubs or gallops, PMI not laterally displaced GI- soft, NT, ND, + BS Extremities- no clubbing, cyanosis, or edema  ICD interrogation- reviewed in detail today,  See PACEART report  ekg today reveals atrial paced rhythm, IVCD (QRS 132 msc)  Assessment and Plan:  1.  Chronic systolic dysfunction euvolemic today Stable on an appropriate medical regimen Normal ICD function See Pace Art report No changes today Bmet, bnp today Followed in ICM clinic Overdue to see Dr Aundra Dubin  2. HTN Low BP Appears a little dry on exam Bnp, bmet, cbc  3. CAD No ischemic changes Stable No change required today  4. Atrial flutter Well controlled On coumadin   Likely to be ERI in about 9 months Could hold coumadin for 48 hours prior to gen change  carelink Return to see EP NP in 1 year He needs to see Dr Aundra Dubin soon  Thompson Grayer MD, [facility] 12/02/2016 9:06 AM

## 2016-12-02 NOTE — Patient Instructions (Addendum)
Medication Instructions:  Your physician recommends that you continue on your current medications as directed. Please refer to the Current Medication list given to you today.   Labwork: Your physician recommends that you schedule a follow-up appointment today: BNP/BMP/CBC   Testing/Procedures: None ordered   Follow-Up: Remote monitoring is used to monitor your  ICD from home. This monitoring reduces the number of office visits required to check your device to one time per year. It allows [place] to keep an eye on the functioning of your device to ensure it is working properly. You are scheduled for a device check from home on 03/03/17. You may send your transmission at any time that day. If you have a wireless device, the transmission will be sent automatically. After your physician reviews your transmission, you will receive a postcard with your next transmission date.   Your physician wants you to follow-up in: 12 months with Chanetta Marshall, NP You will receive a reminder letter in the mail two months in advance. If you don't receive a letter, please call our office to schedule the follow-up appointment.   Need to call the CHF clinic and get a follow up with Dr Aundra Dubin  Any Other Special Instructions Will Be Listed Below (If Applicable).     If you need a refill on your cardiac medications before your next appointment, please call your pharmacy.

## 2016-12-03 ENCOUNTER — Other Ambulatory Visit: Payer: Self-pay | Admitting: Cardiology

## 2016-12-15 ENCOUNTER — Other Ambulatory Visit: Payer: Self-pay | Admitting: Cardiology

## 2016-12-15 ENCOUNTER — Other Ambulatory Visit: Payer: Self-pay | Admitting: Internal Medicine

## 2016-12-31 ENCOUNTER — Ambulatory Visit (INDEPENDENT_AMBULATORY_CARE_PROVIDER_SITE_OTHER): Payer: Medicare Other | Admitting: *Deleted

## 2016-12-31 DIAGNOSIS — Z5181 Encounter for therapeutic drug level monitoring: Secondary | ICD-10-CM

## 2016-12-31 DIAGNOSIS — I483 Typical atrial flutter: Secondary | ICD-10-CM

## 2016-12-31 DIAGNOSIS — I255 Ischemic cardiomyopathy: Secondary | ICD-10-CM

## 2016-12-31 LAB — POCT INR: INR: 2.5

## 2017-01-05 ENCOUNTER — Telehealth: Payer: Self-pay

## 2017-01-05 ENCOUNTER — Ambulatory Visit (INDEPENDENT_AMBULATORY_CARE_PROVIDER_SITE_OTHER): Payer: Medicare Other

## 2017-01-05 ENCOUNTER — Other Ambulatory Visit: Payer: Self-pay | Admitting: Cardiology

## 2017-01-05 DIAGNOSIS — Z95810 Presence of automatic (implantable) cardiac defibrillator: Secondary | ICD-10-CM

## 2017-01-05 DIAGNOSIS — I5022 Chronic systolic (congestive) heart failure: Secondary | ICD-10-CM

## 2017-01-05 NOTE — Progress Notes (Signed)
EPIC Encounter for ICM Monitoring  Patient Name: Anthony Rubio. is a 66 y.o. male Date: 01/05/2017 Primary Care Physican: Golden Circle, [place] Primary [place] Electrophysiologist: Allred Dry Weight:unknown       Attempted call to patient and unable to reach.  Left detailed message regarding transmission.  Transmission reviewed.    Thoracic impedance normal   Prescribed dosage: Furosemide 40 mg .5 tablet (20 mg total) daily  Labs: 12/02/2016 Creatinine 1.63, BUN 24, Potassium 4.7, Sodium 139, EGFR 43-50 02/13/2016 Creatinine 1.86, BUN 30, Potassium 4.7, Sodium 140  08/29/2015 Creatinine 1.67, BUN 29, Potassium 5.0, Sodium 139  08/09/2015 Creatinine 1.68, BUN 28, Potassium 5.0, Sodium 139   Recommendations: Left voice mail with ICM number and encouraged to call for fluid symptoms.  Follow-up plan: ICM clinic phone appointment on 02/08/2017.    Copy of ICM check sent to device physician.   3 month ICM trend: 01/05/2017   1 Year ICM trend:      [name], RN 01/05/2017 9:28 AM

## 2017-01-05 NOTE — Telephone Encounter (Signed)
Remote ICM transmission received.  Attempted patient call and left detailed message regarding transmission and next ICM scheduled for 02/08/2017.  Advised to return call for any fluid symptoms or questions.

## 2017-01-09 ENCOUNTER — Other Ambulatory Visit (HOSPITAL_COMMUNITY): Payer: Self-pay | Admitting: Internal Medicine

## 2017-01-26 ENCOUNTER — Other Ambulatory Visit: Payer: Self-pay

## 2017-01-28 ENCOUNTER — Ambulatory Visit (INDEPENDENT_AMBULATORY_CARE_PROVIDER_SITE_OTHER): Payer: Medicare Other | Admitting: *Deleted

## 2017-01-28 ENCOUNTER — Other Ambulatory Visit: Payer: Self-pay | Admitting: Internal Medicine

## 2017-01-28 DIAGNOSIS — Z5181 Encounter for therapeutic drug level monitoring: Secondary | ICD-10-CM

## 2017-01-28 DIAGNOSIS — I255 Ischemic cardiomyopathy: Secondary | ICD-10-CM | POA: Diagnosis not present

## 2017-01-28 DIAGNOSIS — I483 Typical atrial flutter: Secondary | ICD-10-CM | POA: Diagnosis not present

## 2017-01-28 LAB — POCT INR: INR: 2.2

## 2017-01-28 MED ORDER — WARFARIN SODIUM 5 MG PO TABS: ORAL_TABLET | ORAL | 2 refills | Status: DC

## 2017-01-28 MED ORDER — SPIRONOLACTONE 25 MG PO TABS: 25.0000 mg | ORAL_TABLET | Freq: Every day | ORAL | 2 refills | Status: DC

## 2017-01-28 NOTE — Telephone Encounter (Signed)
Pt's medication was sent to pt's pharmacy as requested. Confirmation received.  °

## 2017-02-02 ENCOUNTER — Other Ambulatory Visit: Payer: Self-pay

## 2017-02-02 DIAGNOSIS — E1165 Type 2 diabetes mellitus with hyperglycemia: Principal | ICD-10-CM

## 2017-02-02 DIAGNOSIS — IMO0002 Reserved for concepts with insufficient information to code with codable children: Secondary | ICD-10-CM

## 2017-02-02 DIAGNOSIS — E1140 Type 2 diabetes mellitus with diabetic neuropathy, unspecified: Secondary | ICD-10-CM

## 2017-02-02 MED ORDER — GLIPIZIDE ER 10 MG PO TB24: 10.0000 mg | ORAL_TABLET | Freq: Every day | ORAL | 0 refills | Status: DC

## 2017-02-08 ENCOUNTER — Telehealth: Payer: Self-pay

## 2017-02-08 ENCOUNTER — Encounter: Payer: Medicare Other | Admitting: Internal Medicine

## 2017-02-08 ENCOUNTER — Ambulatory Visit (INDEPENDENT_AMBULATORY_CARE_PROVIDER_SITE_OTHER): Payer: Medicare Other | Admitting: *Deleted

## 2017-02-08 DIAGNOSIS — I5022 Chronic systolic (congestive) heart failure: Secondary | ICD-10-CM | POA: Diagnosis not present

## 2017-02-08 DIAGNOSIS — I255 Ischemic cardiomyopathy: Secondary | ICD-10-CM | POA: Diagnosis not present

## 2017-02-08 DIAGNOSIS — Z95810 Presence of automatic (implantable) cardiac defibrillator: Secondary | ICD-10-CM

## 2017-02-08 NOTE — Telephone Encounter (Signed)
Remote ICM transmission received.  Attempted patient call and left detailed message regarding transmission and next ICM scheduled for 03/11/2017.  Advised to return call for any fluid symptoms or questions.

## 2017-02-08 NOTE — Progress Notes (Signed)
EPIC Encounter for ICM Monitoring  Patient Name: Anthony Rubio. is a 66 y.o. male Date: 02/08/2017 Primary Care Physican: Golden Circle, [place] Primary [place] Electrophysiologist: Allred Dry Weight:unknown        Attempted call to patient and unable to reach.  Left detailed message regarding transmission.  Transmission reviewed.    Thoracic impedance normal.  Prescribed dosage: Furosemide 40 mg .5 tablet (20 mg total) daily  Labs: 12/02/2016 Creatinine 1.63, BUN 24, Potassium 4.7, Sodium 139, EGFR 43-50 02/13/2016 Creatinine 1.86, BUN 30, Potassium 4.7, Sodium 140  08/29/2015 Creatinine 1.67, BUN 29, Potassium 5.0, Sodium 139  08/09/2015 Creatinine 1.68, BUN 28, Potassium 5.0, Sodium 139   Recommendations: Left voice mail with ICM number and encouraged to call for fluid symptoms.  Follow-up plan: ICM clinic phone appointment on 03/11/2017.  Over due appointment with HF clinic (last visit 08/09/15)  Copy of ICM check sent to device physician.   3 month ICM trend: 02/08/2017   1 Year ICM trend:      [name], RN 02/08/2017 11:30 AM

## 2017-02-08 NOTE — Progress Notes (Signed)
ICD Remote transmission received 

## 2017-02-10 ENCOUNTER — Encounter: Payer: Self-pay | Admitting: Cardiology

## 2017-02-25 ENCOUNTER — Ambulatory Visit (INDEPENDENT_AMBULATORY_CARE_PROVIDER_SITE_OTHER): Payer: Medicare Other | Admitting: *Deleted

## 2017-02-25 DIAGNOSIS — Z5181 Encounter for therapeutic drug level monitoring: Secondary | ICD-10-CM

## 2017-02-25 DIAGNOSIS — I483 Typical atrial flutter: Secondary | ICD-10-CM

## 2017-02-25 DIAGNOSIS — I255 Ischemic cardiomyopathy: Secondary | ICD-10-CM

## 2017-02-25 LAB — CUP PACEART REMOTE DEVICE CHECK
Battery Voltage: 2.68 V
Brady Statistic AP VP Percent: 0.06 %
Brady Statistic AP VS Percent: 34.52 %
Brady Statistic AS VS Percent: 65.38 %
Brady Statistic RV Percent Paced: 0.1 %
Date Time Interrogation Session: 20180806062514
HighPow Impedance: 266 Ohm
HighPow Impedance: 43 Ohm
HighPow Impedance: 51 Ohm
Implantable Lead Implant Date: 20111108
Implantable Pulse Generator Implant Date: 20111108
Lead Channel Impedance Value: 456 Ohm
Lead Channel Pacing Threshold Amplitude: 0.75 V
Lead Channel Pacing Threshold Pulse Width: 0.4 ms
Lead Channel Pacing Threshold Pulse Width: 0.4 ms
Lead Channel Sensing Intrinsic Amplitude: 3.125 mV
Lead Channel Sensing Intrinsic Amplitude: 3.125 mV
Lead Channel Setting Pacing Amplitude: 2.5 V
MDC IDC LEAD IMPLANT DT: 20111108
MDC IDC LEAD LOCATION: 753859
MDC IDC LEAD LOCATION: 753860
MDC IDC MSMT LEADCHNL RV IMPEDANCE VALUE: 323 Ohm
MDC IDC MSMT LEADCHNL RV PACING THRESHOLD AMPLITUDE: 1 V
MDC IDC MSMT LEADCHNL RV SENSING INTR AMPL: 7.5 mV
MDC IDC MSMT LEADCHNL RV SENSING INTR AMPL: 7.5 mV
MDC IDC SET LEADCHNL RA PACING AMPLITUDE: 2 V
MDC IDC SET LEADCHNL RV PACING PULSEWIDTH: 0.4 ms
MDC IDC SET LEADCHNL RV SENSING SENSITIVITY: 0.3 mV
MDC IDC STAT BRADY AS VP PERCENT: 0.03 %
MDC IDC STAT BRADY RA PERCENT PACED: 34.1 %

## 2017-02-25 LAB — POCT INR: INR: 1.8

## 2017-03-03 ENCOUNTER — Other Ambulatory Visit: Payer: Self-pay | Admitting: Cardiology

## 2017-03-03 ENCOUNTER — Other Ambulatory Visit: Payer: Self-pay | Admitting: Internal Medicine

## 2017-03-11 ENCOUNTER — Ambulatory Visit (INDEPENDENT_AMBULATORY_CARE_PROVIDER_SITE_OTHER): Payer: Medicare Other

## 2017-03-11 DIAGNOSIS — Z95810 Presence of automatic (implantable) cardiac defibrillator: Secondary | ICD-10-CM | POA: Diagnosis not present

## 2017-03-11 DIAGNOSIS — I5022 Chronic systolic (congestive) heart failure: Secondary | ICD-10-CM | POA: Diagnosis not present

## 2017-03-11 NOTE — Progress Notes (Signed)
EPIC Encounter for ICM Monitoring  Patient Name: Anthony Rubio. is a 66 y.o. male Date: 03/11/2017 Primary Care Physican: Golden Circle, [place] Primary [place] Electrophysiologist: Allred Dry Weight:unknown       Heart Failure questions reviewed, pt asymptomatic.   Thoracic impedance normal but was abnormal suggesting fluid accumulation from 8/18 to 9/1.  Prescribed dosage: Furosemide 40 mg .5 tablet (20 mg total) daily  Labs: 12/02/2016 Creatinine 1.63, BUN 24, Potassium 4.7, Sodium 139, EGFR 43-50 02/13/2016 Creatinine 1.86, BUN 30, Potassium 4.7, Sodium 140  08/29/2015 Creatinine 1.67, BUN 29, Potassium 5.0, Sodium 139  08/09/2015 Creatinine 1.68, BUN 28, Potassium 5.0, Sodium 139   Recommendations: No changes.   Encouraged to call for fluid symptoms.  Follow-up plan: ICM clinic phone appointment on 04/16/2017. Advised to he is over due to schedule appointment with Dr Aundra Dubin (last visit 08/09/15)  Copy of ICM check sent to Dr. Rayann Heman.   3 month ICM trend: 03/11/2017   1 Year ICM trend:      Anthony Billings, RN 03/11/2017 11:50 AM

## 2017-03-25 ENCOUNTER — Ambulatory Visit (INDEPENDENT_AMBULATORY_CARE_PROVIDER_SITE_OTHER): Payer: Medicare Other | Admitting: *Deleted

## 2017-03-25 DIAGNOSIS — Z5181 Encounter for therapeutic drug level monitoring: Secondary | ICD-10-CM

## 2017-03-25 DIAGNOSIS — I483 Typical atrial flutter: Secondary | ICD-10-CM | POA: Diagnosis not present

## 2017-03-25 LAB — POCT INR: INR: 1.6

## 2017-04-16 ENCOUNTER — Telehealth: Payer: Self-pay | Admitting: Cardiology

## 2017-04-16 ENCOUNTER — Ambulatory Visit (INDEPENDENT_AMBULATORY_CARE_PROVIDER_SITE_OTHER): Payer: Medicare Other

## 2017-04-16 DIAGNOSIS — Z95810 Presence of automatic (implantable) cardiac defibrillator: Secondary | ICD-10-CM

## 2017-04-16 DIAGNOSIS — I5022 Chronic systolic (congestive) heart failure: Secondary | ICD-10-CM | POA: Diagnosis not present

## 2017-04-16 NOTE — Telephone Encounter (Signed)
LMOVM reminding pt to send remote transmission.   

## 2017-04-19 NOTE — Progress Notes (Signed)
EPIC Encounter for ICM Monitoring  Patient Name: Anthony S Loudon Jr. is a 66 y.o. male Date: 04/19/2017 Primary Care Physican: Calone, Gregory D, FNP Primary Cardiologist:[place] Electrophysiologist: Allred Dry Weight: 245 lbs       Heart Failure questions reviewed, pt symptomatic with a little with swelling in the past week but has resolved.    Thoracic impedance abnormal suggesting fluid accumulation since 04/09/2017.  Prescribed dosage: Furosemide 40 mg 1 tablet daily  Labs: 12/02/2016 Creatinine 1.63, BUN 24, Potassium 4.7, Sodium 139, EGFR 43-50 02/13/2016 Creatinine 1.86, BUN 30, Potassium 4.7, Sodium 140  08/29/2015 Creatinine 1.67, BUN 29, Potassium 5.0, Sodium 139  08/09/2015 Creatinine 1.68, BUN 28, Potassium 5.0, Sodium 139   Recommendations:  Patient has been eating sausage and bacon and he can tell when he retains fluid because his ankles will swell.  Advised to limit salt to 2000 mg daily.  Encouraged to call for fluid symptoms.  Follow-up plan: ICM clinic phone appointment on 04/29/2017 to recheck fluid levels.  Patient overdue to schedule appointment with Dr McLean.  Copy of ICM check sent to Dr. McLean and Dr. Allred for review and recommendations if needed.   3 month ICM trend: 04/19/2017   1 Year ICM trend:      Laurie S Short, RN 04/19/2017 12:52 PM    

## 2017-04-22 ENCOUNTER — Ambulatory Visit (INDEPENDENT_AMBULATORY_CARE_PROVIDER_SITE_OTHER): Payer: Medicare Other | Admitting: *Deleted

## 2017-04-22 DIAGNOSIS — I483 Typical atrial flutter: Secondary | ICD-10-CM

## 2017-04-22 DIAGNOSIS — Z5181 Encounter for therapeutic drug level monitoring: Secondary | ICD-10-CM | POA: Diagnosis not present

## 2017-04-22 LAB — POCT INR: INR: 2.2

## 2017-04-25 NOTE — Progress Notes (Signed)
Increase Lasix to 40 qam/20 qpm with BMET in 1 week and arrange followup.

## 2017-04-26 MED ORDER — FUROSEMIDE 40 MG PO TABS: ORAL_TABLET | ORAL | 3 refills | Status: DC

## 2017-04-26 NOTE — Addendum Note (Signed)
Addended by: [name] on: 04/26/2017 02:54 PM   Modules accepted: Orders

## 2017-04-26 NOTE — Progress Notes (Addendum)
Call to patient and advised Dr Aundra Dubin ordered to increase Furosemide to 40 mg 1 tablet every AM and 0.5 tablet every PM.  He verbalized understanding.  Advised labs have been ordered and he needs to schedule follow up appointment with Dr Aundra Dubin.  He reported he cannot have labs drawn next week because he cannot drive to the office so many times.  He agreed to have labs drawn on same day as coumadin appointment which is 05/20/2017 and wants to have it drawn at [address] office.  Explained importance of checking the kidneys and electrolytes after Furosemide increase but he still declined to have drawn until 11/15.  He was hesitant to scheduled follow up appointment with Dr Aundra Dubin but did agree at the end of the conversation.  Message sent to HF clinic to schedule appointment.

## 2017-04-29 ENCOUNTER — Ambulatory Visit (INDEPENDENT_AMBULATORY_CARE_PROVIDER_SITE_OTHER): Payer: Self-pay

## 2017-04-29 DIAGNOSIS — Z95810 Presence of automatic (implantable) cardiac defibrillator: Secondary | ICD-10-CM

## 2017-04-29 DIAGNOSIS — I5022 Chronic systolic (congestive) heart failure: Secondary | ICD-10-CM

## 2017-04-29 NOTE — Progress Notes (Signed)
EPIC Encounter for ICM Monitoring  Patient Name: Anthony Rubio. is a 66 y.o. male Date: 04/29/2017 Primary Care Physican: Golden Circle, FNP Primary Cardiologist:[place] Electrophysiologist: Allred Dry Weight: 245 lbs      Heart Failure questions reviewed, pt asymptomatic.  Patient reported increase in urination after increase in Furosemide dosage.    Thoracic impedance almost at baseline since increase in Furosemide dosage on 04/16/2017 by Dr Aundra Dubin.  Prescribed dosage: Furosemide to 40 mg 1 tablet every AM and 0.5 tablet every PM  Recommendations: No changes.  Encouraged to call for fluid symptoms.  Follow-up plan: ICM clinic phone appointment on 05/20/2017.  Office appointment scheduled 06/18/2017 with Dr. Aundra Dubin.  Copy of ICM check sent to Dr. Aundra Dubin and Dr. Rayann Heman.   3 month ICM trend: 04/29/2017   1 Year ICM trend:      [name], RN 04/29/2017 2:14 PM

## 2017-05-07 ENCOUNTER — Telehealth: Payer: Self-pay | Admitting: *Deleted

## 2017-05-07 DIAGNOSIS — IMO0002 Reserved for concepts with insufficient information to code with codable children: Secondary | ICD-10-CM

## 2017-05-07 DIAGNOSIS — E1140 Type 2 diabetes mellitus with diabetic neuropathy, unspecified: Secondary | ICD-10-CM

## 2017-05-07 DIAGNOSIS — E1165 Type 2 diabetes mellitus with hyperglycemia: Principal | ICD-10-CM

## 2017-05-10 MED ORDER — GLIPIZIDE ER 10 MG PO TB24: 10.0000 mg | ORAL_TABLET | Freq: Every day | ORAL | 0 refills | Status: DC

## 2017-05-10 NOTE — Telephone Encounter (Signed)
Patient has set up an appointment to transfer care to [facility] in January.

## 2017-05-10 NOTE — Addendum Note (Signed)
Addended by: Earnstine Regal on: 05/10/2017 11:56 AM   Modules accepted: Orders

## 2017-05-10 NOTE — Telephone Encounter (Signed)
Per office policy sent enough refills until appt...Anthony Rubio

## 2017-05-11 ENCOUNTER — Other Ambulatory Visit: Payer: Self-pay

## 2017-05-11 DIAGNOSIS — E1165 Type 2 diabetes mellitus with hyperglycemia: Principal | ICD-10-CM

## 2017-05-11 DIAGNOSIS — IMO0002 Reserved for concepts with insufficient information to code with codable children: Secondary | ICD-10-CM

## 2017-05-11 DIAGNOSIS — E1140 Type 2 diabetes mellitus with diabetic neuropathy, unspecified: Secondary | ICD-10-CM

## 2017-05-20 ENCOUNTER — Other Ambulatory Visit: Payer: Medicare Other | Admitting: *Deleted

## 2017-05-20 ENCOUNTER — Ambulatory Visit (INDEPENDENT_AMBULATORY_CARE_PROVIDER_SITE_OTHER): Payer: Medicare Other | Admitting: *Deleted

## 2017-05-20 ENCOUNTER — Ambulatory Visit (INDEPENDENT_AMBULATORY_CARE_PROVIDER_SITE_OTHER): Payer: Medicare Other | Admitting: Pharmacist

## 2017-05-20 ENCOUNTER — Other Ambulatory Visit: Payer: Medicare Other

## 2017-05-20 DIAGNOSIS — Z95810 Presence of automatic (implantable) cardiac defibrillator: Secondary | ICD-10-CM | POA: Diagnosis not present

## 2017-05-20 DIAGNOSIS — I483 Typical atrial flutter: Secondary | ICD-10-CM

## 2017-05-20 DIAGNOSIS — Z5181 Encounter for therapeutic drug level monitoring: Secondary | ICD-10-CM | POA: Diagnosis not present

## 2017-05-20 DIAGNOSIS — I255 Ischemic cardiomyopathy: Secondary | ICD-10-CM | POA: Diagnosis not present

## 2017-05-20 DIAGNOSIS — I5022 Chronic systolic (congestive) heart failure: Secondary | ICD-10-CM | POA: Diagnosis not present

## 2017-05-20 LAB — BASIC METABOLIC PANEL
BUN/Creatinine Ratio: 18 (ref 10–24)
BUN: 28 mg/dL — ABNORMAL HIGH (ref 8–27)
CO2: 19 mmol/L — ABNORMAL LOW (ref 20–29)
CREATININE: 1.53 mg/dL — AB (ref 0.76–1.27)
Calcium: 9.1 mg/dL (ref 8.6–10.2)
Chloride: 103 mmol/L (ref 96–106)
GFR calc Af Amer: 54 mL/min/{1.73_m2} — ABNORMAL LOW (ref >59)
GFR, EST NON AFRICAN AMERICAN: 46 mL/min/{1.73_m2} — AB (ref >59)
Glucose: 166 mg/dL — ABNORMAL HIGH (ref 65–99)
Potassium: 5 mmol/L (ref 3.5–5.2)
SODIUM: 140 mmol/L (ref 134–144)

## 2017-05-20 LAB — POCT INR: INR: 1.6

## 2017-05-20 NOTE — Progress Notes (Signed)
Remote ICD transmission.   

## 2017-05-20 NOTE — Patient Instructions (Signed)
Take 2 tablets tonight then cntinue taking 1.5 tablets every day except 1 tablet only on  Thursdays.  Recommend recheck in 2-3 weeks, pt prefers 4 weeks. Call if any questions or problems [phone]

## 2017-05-21 LAB — CUP PACEART REMOTE DEVICE CHECK
Battery Voltage: 2.64 V
Brady Statistic AP VS Percent: 43.52 %
Brady Statistic AS VS Percent: 56.3 %
Date Time Interrogation Session: 20181115081807
HighPow Impedance: 266 Ohm
HighPow Impedance: 44 Ohm
HighPow Impedance: 53 Ohm
Implantable Lead Implant Date: 20111108
Implantable Lead Location: 753859
Lead Channel Impedance Value: 494 Ohm
Lead Channel Pacing Threshold Amplitude: 0.75 V
Lead Channel Pacing Threshold Pulse Width: 0.4 ms
Lead Channel Pacing Threshold Pulse Width: 0.4 ms
Lead Channel Sensing Intrinsic Amplitude: 3 mV
Lead Channel Setting Pacing Amplitude: 2.5 V
MDC IDC LEAD IMPLANT DT: 20111108
MDC IDC LEAD LOCATION: 753860
MDC IDC MSMT LEADCHNL RA SENSING INTR AMPL: 3 mV
MDC IDC MSMT LEADCHNL RV IMPEDANCE VALUE: 323 Ohm
MDC IDC MSMT LEADCHNL RV PACING THRESHOLD AMPLITUDE: 0.875 V
MDC IDC MSMT LEADCHNL RV SENSING INTR AMPL: 5.875 mV
MDC IDC MSMT LEADCHNL RV SENSING INTR AMPL: 5.875 mV
MDC IDC PG IMPLANT DT: 20111108
MDC IDC SET LEADCHNL RA PACING AMPLITUDE: 2 V
MDC IDC SET LEADCHNL RV PACING PULSEWIDTH: 0.4 ms
MDC IDC SET LEADCHNL RV SENSING SENSITIVITY: 0.3 mV
MDC IDC STAT BRADY AP VP PERCENT: 0.14 %
MDC IDC STAT BRADY AS VP PERCENT: 0.04 %
MDC IDC STAT BRADY RA PERCENT PACED: 42.13 %
MDC IDC STAT BRADY RV PERCENT PACED: 0.21 %

## 2017-05-21 NOTE — Progress Notes (Signed)
EPIC Encounter for ICM Monitoring  Patient Name: Anthony Rubio. is a 66 y.o. male Date: 05/21/2017 Primary Care Physican: Golden Circle, FNP Primary [place] Electrophysiologist: Allred Dry Weight: Previous weight 245 lbs       Attempted call to patient and unable to reach.  Left detailed message regarding transmission.  Transmission reviewed.    Thoracic impedance normal.  Prescribed dosage: Furosemide to 40 mg 1 tablet every AM and 0.5 tablet every PM  Recommendations: Left voice mail with ICM number and encouraged to call if experiencing any fluid symptoms.  Follow-up plan: ICM clinic phone appointment on 06/21/2017.  Office appointment scheduled 06/18/2017 with Dr. Aundra Dubin.  Copy of ICM check sent to Dr. Rayann Heman.   3 month ICM trend: 05/20/2017     1 Year ICM trend:       [name], RN 05/21/2017 2:58 PM

## 2017-05-22 DIAGNOSIS — Z23 Encounter for immunization: Secondary | ICD-10-CM | POA: Diagnosis not present

## 2017-05-26 ENCOUNTER — Encounter: Payer: Self-pay | Admitting: Cardiology

## 2017-05-26 ENCOUNTER — Encounter: Payer: Self-pay | Admitting: Family Medicine

## 2017-05-26 ENCOUNTER — Ambulatory Visit (INDEPENDENT_AMBULATORY_CARE_PROVIDER_SITE_OTHER): Payer: Medicare Other | Admitting: Family Medicine

## 2017-05-26 ENCOUNTER — Telehealth: Payer: Self-pay | Admitting: Family

## 2017-05-26 VITALS — BP 140/80 | HR 81 | Temp 98.0°F | Ht 75.0 in | Wt 240.2 lb

## 2017-05-26 DIAGNOSIS — K047 Periapical abscess without sinus: Secondary | ICD-10-CM | POA: Diagnosis not present

## 2017-05-26 MED ORDER — AMOXICILLIN 875 MG PO TABS: 875.0000 mg | ORAL_TABLET | Freq: Two times a day (BID) | ORAL | 0 refills | Status: DC

## 2017-05-26 NOTE — Progress Notes (Signed)
    Subjective:  Anthony Mandich. is a 66 y.o. male who presents today with a chief complaint of mouth pain.   HPI:  Mouth Pain, acute issue Started about a week ago. Worsened over that time. Located in left lower jaw.  Feels similar to prior dental abscesses.  He has been treated with amoxicillin in the past for these which has worked well.  No treatments tried.  Pain is worse with talking, and chewing.  No fevers.  No drainage.  ROS: Per HPI  PMH: Smoking history reviewed.  Current smoker.  Objective:  Physical Exam: BP 140/80 (BP Location: Left Arm, Patient Position: Sitting, Cuff Size: Normal)   Pulse 81   Temp 98 F (36.7 C) (Oral)   Ht 6\' 3"  (1.905 m)   Wt 240 lb 4 oz (109 kg)   SpO2 96%   BMI 30.03 kg/m Gen: NAD, resting comfortably HEENT: Poor dentition.  Left lower molar with erythema and small amount of purulent drainage.  No evidence of Ludwig's angina.  Assessment/Plan:  Dental abscess, acute issue No signs of systemic illness.  Start amoxicillin as this is worked well for patient in the past and he can afford it.  Patient declined pain medications.  He will be following up with dentist next week for extraction.  Return precautions reviewed.  Follow-up as needed.  Algis Greenhouse. Jerline Pain, MD 05/26/2017 5:01 PM

## 2017-05-26 NOTE — Telephone Encounter (Signed)
Pt called back checking on this. Appointment scheduled for Friday.

## 2017-05-26 NOTE — Telephone Encounter (Signed)
Error

## 2017-05-26 NOTE — Telephone Encounter (Signed)
Pt called stating that he is having pain in the bottom of his jaw and believes that it is an abscess. He is schedule to see a dentist for a possible extraction on Monday but was told to call his PCP office to see if something can be prescribed until he is able to be seen. He mentioned generic Ampicillin sent to [facility] on [address]? He said that he has used this in the past and it helped a lot. He said that pain is getting worse each day and is concerned about waiting until Monday for something to be done.

## 2017-05-26 NOTE — Telephone Encounter (Signed)
Pt called back stating that with Thanksgiving being tomorrow, he does not want to wait until Friday. Pt is scheduled to see Dr Hampton Abbot this evening at 4:00. Pt is aware of where the office is and said he could be there in time for the visit.

## 2017-05-26 NOTE — Telephone Encounter (Signed)
Patient is scheduled to see you on 07/19/16 to establish. Would you be willing to send medication in to get him through the weekend? Please advise

## 2017-05-28 ENCOUNTER — Ambulatory Visit: Payer: Medicare Other | Admitting: Family Medicine

## 2017-06-01 ENCOUNTER — Ambulatory Visit (INDEPENDENT_AMBULATORY_CARE_PROVIDER_SITE_OTHER): Payer: Medicare Other | Admitting: Family Medicine

## 2017-06-01 ENCOUNTER — Encounter: Payer: Self-pay | Admitting: Family Medicine

## 2017-06-01 VITALS — BP 118/82 | HR 61 | Ht 75.0 in | Wt 239.4 lb

## 2017-06-01 DIAGNOSIS — I10 Essential (primary) hypertension: Secondary | ICD-10-CM

## 2017-06-01 DIAGNOSIS — K047 Periapical abscess without sinus: Secondary | ICD-10-CM

## 2017-06-01 DIAGNOSIS — E1159 Type 2 diabetes mellitus with other circulatory complications: Secondary | ICD-10-CM

## 2017-06-01 DIAGNOSIS — E1169 Type 2 diabetes mellitus with other specified complication: Secondary | ICD-10-CM | POA: Diagnosis not present

## 2017-06-01 DIAGNOSIS — E1165 Type 2 diabetes mellitus with hyperglycemia: Secondary | ICD-10-CM | POA: Diagnosis not present

## 2017-06-01 DIAGNOSIS — E785 Hyperlipidemia, unspecified: Secondary | ICD-10-CM

## 2017-06-01 DIAGNOSIS — I152 Hypertension secondary to endocrine disorders: Secondary | ICD-10-CM

## 2017-06-01 DIAGNOSIS — IMO0002 Reserved for concepts with insufficient information to code with codable children: Secondary | ICD-10-CM

## 2017-06-01 DIAGNOSIS — E1140 Type 2 diabetes mellitus with diabetic neuropathy, unspecified: Secondary | ICD-10-CM

## 2017-06-01 DIAGNOSIS — Z23 Encounter for immunization: Secondary | ICD-10-CM | POA: Diagnosis not present

## 2017-06-01 NOTE — Assessment & Plan Note (Signed)
Continue glipizide.  Check A1c today.  Foot exam performed.  Final pneumonia shot given.  Advised patient to have eye exam done soon.

## 2017-06-01 NOTE — Progress Notes (Signed)
   Subjective:  Anthony Rubio. is a 66 y.o. male who presents today with a chief complaint of dental abscess follow-up and to transfer care to this office.Marland Kitchen   HPI:  Dental Abscess, established problem, improving Patient seen last week and started on amoxicillin.  He was able to see the dentist yesterday and had a tooth extracted.  Pain is much improved.  Is no longer having issues.   Hypertension, Chronic Problem, Stable BP Readings from Last 3 Encounters:  06/01/17 118/82  05/26/17 140/80  12/02/16 (!) 90/40    Current Medications: Coreg 18.75 mg twice daily, Imdur 30 mg daily, losartan 12.5 mg twice weekly, spironolactone 25 mg daily, compliant without side effects.  Type 2 diabetes, chronic problem, stable Currently on glipizide 10 mg daily.  Checks blood sugars frequently home typically in the 140s-160s range.  No hypoglycemic symptoms.  No polyuria or polydipsia.  Checks his feet regularly.  ROS: Per HPI  PMH: Smoking history reviewed. Current smoker.    Objective:  Physical Exam: BP 118/82   Pulse 61   Ht 6\' 3"  (1.905 m)   Wt 239 lb 6.4 oz (108.6 kg)   SpO2 98%   BMI 29.92 kg/m   Gen: NAD, resting comfortably CV: RRR with no murmurs appreciated Pulm: NWOB, CTAB with no crackles, wheezes, or rhonchi GI: Normal bowel sounds present. Soft, Nontender, Nondistended. MSK: Diabetic foot exam performed today.  No ulcerations.  Diminished sensation to monofilament testing distally secondary to diabetic neuropathy. Skin: Warm, dry Neuro: Grossly normal, moves all extremities Psych: Normal affect and thought content  Assessment/Plan:  Hypertension associated with diabetes ([place]) At goal today.  Continue current regimen of Coreg, Imdur, losartan, and spironolactone.  Type 2 diabetes, uncontrolled, with neuropathy (HCC) Continue glipizide.  Check A1c today.  Foot exam performed.  Final pneumonia shot given.  Advised patient to have eye exam done soon.  Dyslipidemia  associated with type 2 diabetes mellitus (HCC) Continue atorvastatin.  Check lipid panel.  Dental abscess Improving.  No further management needed at this time.  Preventive healthcare Gave information for cologuard, pneumonia shot also given today.  Algis Greenhouse. Jerline Pain, MD 06/01/2017 5:11 PM

## 2017-06-01 NOTE — Assessment & Plan Note (Signed)
At goal today.  Continue current regimen of Coreg, Imdur, losartan, and spironolactone.

## 2017-06-01 NOTE — Assessment & Plan Note (Signed)
Continue atorvastatin. Check lipid panel.

## 2017-06-18 ENCOUNTER — Other Ambulatory Visit (HOSPITAL_COMMUNITY): Payer: Self-pay | Admitting: *Deleted

## 2017-06-18 ENCOUNTER — Encounter (HOSPITAL_COMMUNITY): Payer: Self-pay | Admitting: Cardiology

## 2017-06-18 ENCOUNTER — Ambulatory Visit (HOSPITAL_COMMUNITY)
Admission: RE | Admit: 2017-06-18 | Discharge: 2017-06-18 | Disposition: A | Discharge status: Home / Self Care | Payer: Medicare Other | Source: Ambulatory Visit | Attending: Cardiology | Admitting: Cardiology

## 2017-06-18 VITALS — BP 113/82 | HR 75 | Wt 237.0 lb

## 2017-06-18 DIAGNOSIS — Z7901 Long term (current) use of anticoagulants: Secondary | ICD-10-CM | POA: Diagnosis not present

## 2017-06-18 DIAGNOSIS — I5022 Chronic systolic (congestive) heart failure: Secondary | ICD-10-CM | POA: Diagnosis not present

## 2017-06-18 DIAGNOSIS — Z800 Family history of malignant neoplasm of digestive organs: Secondary | ICD-10-CM | POA: Insufficient documentation

## 2017-06-18 DIAGNOSIS — Z7984 Long term (current) use of oral hypoglycemic drugs: Secondary | ICD-10-CM | POA: Insufficient documentation

## 2017-06-18 DIAGNOSIS — Z823 Family history of stroke: Secondary | ICD-10-CM | POA: Insufficient documentation

## 2017-06-18 DIAGNOSIS — I2510 Atherosclerotic heart disease of native coronary artery without angina pectoris: Secondary | ICD-10-CM | POA: Insufficient documentation

## 2017-06-18 DIAGNOSIS — Z833 Family history of diabetes mellitus: Secondary | ICD-10-CM | POA: Insufficient documentation

## 2017-06-18 DIAGNOSIS — Z79899 Other long term (current) drug therapy: Secondary | ICD-10-CM | POA: Diagnosis not present

## 2017-06-18 DIAGNOSIS — N183 Chronic kidney disease, stage 3 (moderate): Secondary | ICD-10-CM | POA: Diagnosis not present

## 2017-06-18 DIAGNOSIS — F17210 Nicotine dependence, cigarettes, uncomplicated: Secondary | ICD-10-CM | POA: Insufficient documentation

## 2017-06-18 DIAGNOSIS — E785 Hyperlipidemia, unspecified: Secondary | ICD-10-CM | POA: Diagnosis not present

## 2017-06-18 DIAGNOSIS — I4892 Unspecified atrial flutter: Secondary | ICD-10-CM | POA: Insufficient documentation

## 2017-06-18 DIAGNOSIS — I739 Peripheral vascular disease, unspecified: Secondary | ICD-10-CM | POA: Diagnosis not present

## 2017-06-18 DIAGNOSIS — I130 Hypertensive heart and chronic kidney disease with heart failure and stage 1 through stage 4 chronic kidney disease, or unspecified chronic kidney disease: Secondary | ICD-10-CM | POA: Diagnosis not present

## 2017-06-18 DIAGNOSIS — I483 Typical atrial flutter: Secondary | ICD-10-CM | POA: Diagnosis not present

## 2017-06-18 DIAGNOSIS — Z7982 Long term (current) use of aspirin: Secondary | ICD-10-CM | POA: Insufficient documentation

## 2017-06-18 DIAGNOSIS — Z8249 Family history of ischemic heart disease and other diseases of the circulatory system: Secondary | ICD-10-CM | POA: Diagnosis not present

## 2017-06-18 DIAGNOSIS — I429 Cardiomyopathy, unspecified: Secondary | ICD-10-CM | POA: Diagnosis not present

## 2017-06-18 DIAGNOSIS — E1122 Type 2 diabetes mellitus with diabetic chronic kidney disease: Secondary | ICD-10-CM | POA: Diagnosis not present

## 2017-06-18 DIAGNOSIS — I350 Nonrheumatic aortic (valve) stenosis: Secondary | ICD-10-CM | POA: Diagnosis not present

## 2017-06-18 LAB — BASIC METABOLIC PANEL
ANION GAP: 11 (ref 5–15)
BUN: 36 mg/dL — ABNORMAL HIGH (ref 6–20)
CO2: 21 mmol/L — ABNORMAL LOW (ref 22–32)
Calcium: 9.2 mg/dL (ref 8.9–10.3)
Chloride: 107 mmol/L (ref 101–111)
Creatinine, Ser: 1.99 mg/dL — ABNORMAL HIGH (ref 0.61–1.24)
GFR calc Af Amer: 38 mL/min — ABNORMAL LOW (ref >60)
GFR, EST NON AFRICAN AMERICAN: 33 mL/min — AB (ref >60)
GLUCOSE: 174 mg/dL — AB (ref 65–99)
POTASSIUM: 4.5 mmol/L (ref 3.5–5.1)
Sodium: 139 mmol/L (ref 135–145)

## 2017-06-18 LAB — LIPID PANEL
Cholesterol: 177 mg/dL (ref 0–200)
HDL: 28 mg/dL — AB (ref >40)
LDL CALC: 127 mg/dL — AB (ref 0–99)
TRIGLYCERIDES: 111 mg/dL (ref <150)
Total CHOL/HDL Ratio: 6.3 RATIO
VLDL: 22 mg/dL (ref 0–40)

## 2017-06-18 LAB — CBC
HEMATOCRIT: 48.5 % (ref 39.0–52.0)
HEMOGLOBIN: 16.1 g/dL (ref 13.0–17.0)
MCH: 28.6 pg (ref 26.0–34.0)
MCHC: 33.2 g/dL (ref 30.0–36.0)
MCV: 86.1 fL (ref 78.0–100.0)
Platelets: 156 10*3/uL (ref 150–400)
RBC: 5.63 MIL/uL (ref 4.22–5.81)
RDW: 14.6 % (ref 11.5–15.5)
WBC: 7.4 10*3/uL (ref 4.0–10.5)

## 2017-06-18 MED ORDER — CARVEDILOL 6.25 MG PO TABS: 18.7500 mg | ORAL_TABLET | Freq: Two times a day (BID) | ORAL | 3 refills | Status: DC

## 2017-06-18 MED ORDER — CARVEDILOL 6.25 MG PO TABS: 18.7500 mg | ORAL_TABLET | Freq: Two times a day (BID) | ORAL | 30 refills | Status: DC

## 2017-06-18 MED ORDER — CARVEDILOL 6.25 MG PO TABS: 6.2500 mg | ORAL_TABLET | Freq: Two times a day (BID) | ORAL | 30 refills | Status: DC

## 2017-06-18 MED ORDER — ISOSORB DINITRATE-HYDRALAZINE 20-37.5 MG PO TABS: 0.5000 | ORAL_TABLET | Freq: Three times a day (TID) | ORAL | 3 refills | Status: DC

## 2017-06-18 MED ORDER — ATORVASTATIN CALCIUM 80 MG PO TABS: 80.0000 mg | ORAL_TABLET | Freq: Every day | ORAL | 3 refills | Status: DC

## 2017-06-18 NOTE — Patient Instructions (Signed)
Stop Imdur   Start Bidil 1/2 tab, three times a day  Labs drawn today (if we do not call you, then your lab work was stable)   Your physician has requested that you have an echocardiogram. Echocardiography is a painless test that uses sound waves to create images of your heart. It provides your doctor with information about the size and shape of your heart and how well your heart's chambers and valves are working. This procedure takes approximately one hour. There are no restrictions for this procedure.  Your physician recommends that you schedule a follow-up appointment in: 6 weeks with Dr. Aundra Dubin an a echocardiogram

## 2017-06-19 NOTE — Progress Notes (Signed)
Patient ID: Anthony Rubio., male   DOB: [birth year], 66 y.o.   MRN: [id number] PCP: Dr. Jerline Pain Cardiology: Dr. Aundra Dubin  66 y.o. with history of CAD, mixed ischemic/nonischemic cardiomyopathy, and diabetes who presents for followup. He initially was hospitalized in 5/11 with acute decompensated CHF after several days of shortness of breath, orthopnea, and PND. He was diuresed and left/right heart catheterization was done. Left heart cath showed large, ectatic coronaries with slow flow and moderate to severe diffuse distal vessel disease (consistent with diabetes) without good interventional options. EF was 15-20% by echo. He was put on milrinone and further diuresed for several more days. He was titrated off milrinone and discharged on an oral medication regimen. He later developed atrial flutter requiring cardioversion. He remains in NSR.  Patient had Medtronic ICD placed in 11/11.  Repeat echo in 10/12 showed EF 25%.  Most recent echo in 1/17 showed EF 20-25% with severe LV dilation.   I have not seen him for about 2 years. He returns today for followup of CHF.  He is no longer taking losartan as it made him "dizzy" even at low dose.  He is taking atorvastatin every other day. When he needs to go longer distances, he uses a wheelchair due to poor balance from severe diabetic neuropathy. He is able to walk around the house holding onto furniture.  No significant exertional dyspnea though not very active.  No chest pain.  No orthopnea/PND.  He is still smoking about 1 pack/week.    Medtronic device reviewed: Optivol not suggestive of volume overload, occasional very short runs of atrial fibrillation.    Labs (9/11): LDL 61, HDL 26, K 4.8, creatinine 1.6  Labs (10/11): K 4.3, creatinine 1.3, BNP 165  Labs (11/11): K 4.9, creatinine 1.6  Labs (12/11): K 4.8, creatinine 1.7  Labs (2/12): LDL 110, HDL 28  Labs (3/12): K 4.1, creatinine 1.7 Labs (6/12): K 4.4, creatinine 1.4, BNP 125 Labs (10/12): K 4.8,  creatinine 1.4 Labs (12/12): K 4.4, creatinine 1.5, BNP 20 Labs (3/13): K 5, creatinine 1.5, LDL 43, HDL 31 Labs (8/13): K 4.2, creatinine 1.3, BNP 158 Labs (05/03/14): K 4.6 Creatinine 1.4  Labs (9/16): K 4.4, creatinine 1.5 Labs (5/18): K 4.7, creatinine 1.63, pro-BNP 5249, hgb 14.4  ECG: NSR, 1st degree AVB, IVCD with QRS 138 msec (personally reviewed)  Allergies:  1) ! Erythromycin   Past Medical History:  1. CAD: LHC (5/11) with 50-60% mid LAD, diffuse moderate distal LAD up to 60-70%, small OM2 subtotally occluded, moderate to severe diffuse distal PLOM disease, severe diffuse distal RCA, PDA, and PLV disease. Coronaries were ectatic with slow flow. Patient was started on Plavix given concern for thrombus formation in the ectatic coronaries. He stopped Plavix after starting coumadin with atrial flutter.  2. Chronic systolic CHF: Likely mixed ischemic/nonischemic. Echo (5/11) with EF 15-20%, severe global hypokinesis, mild MR, moderate diastolic dysfunction, mildly decreased RV systolic function. RHC (5/11) with mean RA 14 mmHg, PA 38/26, mean PCWP 25 mmHg, CI 1.8. Repeat echo (8/11) with EF 40-98%, severe diastolic dysfunction, mild MR, RV normal size and systolic function. Per official read, cannot rule out apical clot but it does appear to be only trabeculations. TEE (9/11): EF 15% with global hypokinesis, mild to moderate MR, no LV thrombus and no LAA thrombus.  Echo (10/12): EF 25%.  Echo (1/17) with EF 20-25%, severe LV dilation, mild aortic stenosis with AVA 1.5 cm^2, moderate MR, moderately dilated RV with moderately decreased  RV systolic function, PASP 50 mmHg. Medtronic ICD.  3. Type II diabetes 4. HTN  5. Smoker  6. Severe diabetic neuropathy with chronic gait dysfunction  7. History of left foot ulcer with poor healing  8. Atrial flutter s/p DCCV 9/11  9. Aortic stenosis: Mild by echo in 1/17.  Family History:  Family History of Colon CA 1st degree relative <60 9other  relative)  Heart disease (grandparent)  Stroke (other relative)  dm only in secondary relatives   Social History:  Pt lives in [place]. Smoker, about 1/3 ppd.  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    Current Outpatient Medications  Medication Sig Dispense Refill  . atorvastatin (LIPITOR) 80 MG tablet TAKE 1 TABLET BY MOUTH ONCE DAILY (Patient taking differently: TAKE 1 TABLET EVERY 2-3 DAYS) 30 tablet 6  . atorvastatin (LIPITOR) 80 MG tablet Take 1 tablet (80 mg total) by mouth daily at 6 PM. 30 tablet 3  . furosemide (LASIX) 40 MG tablet Take 1 tablet (40 mg total) every morning and 0.5 tablet (20 mg total) every evening. 135 tablet 3  . glipiZIDE (GLUCOTROL XL) 10 MG 24 hr tablet Take 1 tablet (10 mg total) daily by mouth. Must keep appt w/new provider in Jan for future refills 90 tablet 0  . glucose blood (RELION PRIME TEST) test strip Use to check blood sugars twice a day Dx 250.62 100 each 11  . phenol (CHLORASEPTIC) 1.4 % LIQD Use as directed 1 spray in the mouth or throat as needed for throat irritation / pain. 177 mL 0  . RELION PRIME TEST test strip USE TO CHECK BLOOD SUGAR TWICE A DAY. 100 each 11  . spironolactone (ALDACTONE) 25 MG tablet Take 1 tablet (25 mg total) by mouth daily. 90 tablet 2  . warfarin (COUMADIN) 5 MG tablet Take as directed by coumadin clinic 50 tablet 2  . amoxicillin (AMOXIL) 875 MG tablet Take 1 tablet (875 mg total) by mouth 2 (two) times daily. (Patient not taking: Reported on 06/18/2017) 14 tablet 0  . carvedilol (COREG) 6.25 MG tablet Take 3 tablets (18.75 mg total) by mouth 2 (two) times daily. 180 tablet 3  . isosorbide-hydrALAZINE (BIDIL) 20-37.5 MG tablet Take 0.5 tablets by mouth 3 (three) times daily. 45 tablet 3  . losartan (COZAAR) 25 MG tablet Take 12.5 mg by mouth 2 (two) times a week.     No current facility-administered  medications for this encounter.     BP 113/82 (BP Location: Left Arm, Patient Position: Sitting, Cuff Size: Normal)   Pulse 75   Wt 237 lb (107.5 kg)   SpO2 95%   BMI 29.62 kg/m  General: NAD Neck: No JVD, no thyromegaly or thyroid nodule.  Lungs: Occasional rhonchi CV: Nondisplaced PMI.  Heart regular S1/S2, no S3/S4, no murmur.  No peripheral edema.  No carotid bruit.  1+ PT left, trace PT right.  Abdomen: Soft, nontender, no hepatosplenomegaly, no distention.  Skin: Intact without lesions or rashes.  Neurologic: Alert and oriented x 3.  Psych: Normal affect. Extremities: No clubbing or cyanosis.  HEENT: Normal.   Assessment/Plan:  ATRIAL FLUTTER  Maintaining NSR. Continue warfarin (CBC today). Will need to consider ablation if it recurs.  CHRONIC KIDNEY DISEASE STAGE III  Suspect diabetic nephropathy. BMET today.   Chronic systolic heart failure  Mixed ischemic/nonischemic cardiomyopathy. Has Medtronic ICD.  Last echo in 1/17 with EF 20-25%, severely dilated LV, moderately dilated RV with moderately decreased RV systolic function. He has significant distal vessel CAD as well as a history of poorly controlled diabetes. He has NYHA class II symptoms though not very active due to severe peripheral neuropathy.   - Continue current Coreg.  - He was unable to tolerate even low dose losartan.  - I will try him on Bidil 1/2 tab bid, titrate up as tolerated, stop Imdur. - Continue spironolactone 25 mg daily.  - BMET today.  - I will arrange for echo.  CORONARY ARTERY DISEASE Diffuse moderate to severe distal vessel disease on last cath with ectatic coronaries with slow flow. No interventional target.  No chest pain.  - Continue ASA 81 daily.  - Continue statin, check lipids.   SMOKING I strongly encouraged him to quit smoking.  AORTIC STENOSIS Mild on echo in in 1/17.  Planning to repeat echo.  LEG WEAKNESS Likely due to diabetic neuropathy, but would like to get peripheral  arterial dopplers to rule out significant PAD.   Followup in 6 wks.   [name] 06/19/2017

## 2017-06-21 ENCOUNTER — Ambulatory Visit (INDEPENDENT_AMBULATORY_CARE_PROVIDER_SITE_OTHER): Payer: Medicare Other

## 2017-06-21 DIAGNOSIS — I5022 Chronic systolic (congestive) heart failure: Secondary | ICD-10-CM

## 2017-06-21 DIAGNOSIS — Z95810 Presence of automatic (implantable) cardiac defibrillator: Secondary | ICD-10-CM

## 2017-06-22 NOTE — Progress Notes (Signed)
EPIC Encounter for ICM Monitoring  Patient Name: Anthony Rubio. is a 66 y.o. male Date: 06/22/2017 Primary Care Physican: Golden Circle, FNP Primary Cardiologist:[place] Electrophysiologist: Allred Dry Weight: 237 lbs      Heart Failure questions reviewed, pt asymptomatic.   Thoracic impedance normal.  Prescribed dosage: Furosemide to 40 mg 1 tablet every AM and 0.5 tablet every PM  Recommendations: No changes.   Encouraged to call for fluid symptoms.  Follow-up plan: ICM clinic phone appointment on 07/22/2017.    Copy of ICM check sent to Dr. Rayann Heman.   3 month ICM trend: 06/21/2017    1 Year ICM trend:       [name], RN 06/22/2017 3:20 PM

## 2017-07-01 ENCOUNTER — Other Ambulatory Visit: Payer: Self-pay | Admitting: Cardiology

## 2017-07-13 DIAGNOSIS — Z1212 Encounter for screening for malignant neoplasm of rectum: Secondary | ICD-10-CM | POA: Diagnosis not present

## 2017-07-13 DIAGNOSIS — Z1211 Encounter for screening for malignant neoplasm of colon: Secondary | ICD-10-CM | POA: Diagnosis not present

## 2017-07-13 LAB — COLOGUARD: COLOGUARD: POSITIVE

## 2017-07-15 ENCOUNTER — Other Ambulatory Visit (HOSPITAL_COMMUNITY): Payer: Self-pay | Admitting: *Deleted

## 2017-07-15 DIAGNOSIS — I739 Peripheral vascular disease, unspecified: Secondary | ICD-10-CM

## 2017-07-19 ENCOUNTER — Ambulatory Visit: Payer: Medicare Other | Admitting: Nurse Practitioner

## 2017-07-22 ENCOUNTER — Ambulatory Visit (INDEPENDENT_AMBULATORY_CARE_PROVIDER_SITE_OTHER): Payer: Medicare Other

## 2017-07-22 ENCOUNTER — Ambulatory Visit (INDEPENDENT_AMBULATORY_CARE_PROVIDER_SITE_OTHER): Payer: Medicare Other | Admitting: *Deleted

## 2017-07-22 DIAGNOSIS — Z95810 Presence of automatic (implantable) cardiac defibrillator: Secondary | ICD-10-CM

## 2017-07-22 DIAGNOSIS — I5022 Chronic systolic (congestive) heart failure: Secondary | ICD-10-CM | POA: Diagnosis not present

## 2017-07-22 DIAGNOSIS — I483 Typical atrial flutter: Secondary | ICD-10-CM

## 2017-07-22 DIAGNOSIS — Z5181 Encounter for therapeutic drug level monitoring: Secondary | ICD-10-CM

## 2017-07-22 LAB — POCT INR: INR: 1.9

## 2017-07-22 NOTE — Progress Notes (Signed)
Can increase Lasix to 40 mg bid x 3 days then back to 40 qam/20 qpm

## 2017-07-22 NOTE — Patient Instructions (Signed)
Description   Change coumadin dose to  1.5 tablets every day .  Recommend recheck in 2 weeks, pt prefers 4 weeks and is aware of possible complications in not coming back as requested . Call if any questions or problems [phone]

## 2017-07-22 NOTE — Progress Notes (Signed)
EPIC Encounter for ICM Monitoring  Patient Name: Anthony Rubio. is a 67 y.o. male Date: 07/22/2017 Primary Care Physican: Golden Circle, FNP Primary Cardiologist:[place] Electrophysiologist: Allred Dry Weight: 237 lbs          Heart Failure questions reviewed, pt asymptomatic.  Denied any fluid symptoms.  He said the holiday foods had more salt than normal.    Thoracic impedance abnormal suggesting fluid accumulation since 07/09/2017.  Prescribed dosage: Furosemide to 40 mg 1 tablet every AM and 0.5 tablet every PM  Labs: 06/18/2017 Creatinine 1.99, BUN 36, Potassium 4.5, Sodium 139, EGFR 33-38  05/20/2017 Creatinine 1.53, BUN 28, Potassium 5.0, Sodium 140, EGFR 46-54  12/02/2016 Creatinine 1.63, BUN 24, Potassium 4.7, Sodium 139, EGFR 43-50  A complete set of results can be found in Results Review.  Recommendations: Copy of ICM check sent to Dr. Aundra Dubin and Dr. Rayann Heman for review and recommendations if needed.   Follow-up plan: ICM clinic phone appointment on 07/29/2017 to recheck fluid levels.  Office appointment scheduled 08/06/2017 with Dr. Aundra Dubin.  3 month ICM trend: 07/22/2017    1 Year ICM trend:       [name], RN 07/22/2017 12:31 PM

## 2017-07-23 NOTE — Progress Notes (Signed)
Call to patient and advised Dr Aundra Dubin recommended he take Lasix to 40 mg bid x 3 days then back to 40 qam/20 qpm.  He verbalized understanding.

## 2017-07-29 ENCOUNTER — Ambulatory Visit (INDEPENDENT_AMBULATORY_CARE_PROVIDER_SITE_OTHER): Payer: Self-pay

## 2017-07-29 DIAGNOSIS — I5022 Chronic systolic (congestive) heart failure: Secondary | ICD-10-CM

## 2017-07-29 DIAGNOSIS — Z95810 Presence of automatic (implantable) cardiac defibrillator: Secondary | ICD-10-CM

## 2017-07-29 NOTE — Progress Notes (Signed)
EPIC Encounter for ICM Monitoring  Patient Name: Anthony Rubio. is a 67 y.o. male Date: 07/29/2017 Primary Care Physican: Vivi Barrack, MD Primary Cardiologist:[place] Electrophysiologist: Allred Dry Weight: 237lbs  Clinical Status (22-Jul-2017 to 29-Jul-2017) Treated VT/VF 0 episodes  AT/AF 1 episode  Time in AT/AF 15.0 hr/day (62.4%)  Observations (1) (22-Jul-2017 to 29-Jul-2017)  AT/AF >= 6 hr for 4 days.      Heart Failure questions reviewed, pt asymptomatic.  Patient does not have a good understanding of his heart condition and why he is taking the medications since he feels so good.  He does not think he should be on BP med since that has been normal for the last 8 years.  Advised to discuss with Dr Aundra Dubin at office visit the condition of his heart and reason for his meds.     Thoracic impedance returned to normal after taking Furosemide 40 bid x 3 days but then started re-accumulating after returning to prescribed dosage.  Prescribed dosage: Furosemide to 40 mg 1 tablet every AM and 0.5 tablet every PM  Labs: 06/18/2017 Creatinine 1.99, BUN 36, Potassium 4.5, Sodium 139, EGFR 33-38  05/20/2017 Creatinine 1.53, BUN 28, Potassium 5.0, Sodium 140, EGFR 46-54  12/02/2016 Creatinine 1.63, BUN 24, Potassium 4.7, Sodium 139, EGFR 43-50  A complete set of results can be found in Results Review.  Recommendations: No changes.  Advised patient to discuss his meds and heart condition with Dr Aundra Dubin.   Follow-up plan: ICM clinic phone appointment on 08/23/2017.  Office appointment scheduled 08/06/2017 with Dr. Aundra Dubin.  Copy of ICM check sent to Dr. Aundra Dubin and Dr. Rayann Heman.   3 month ICM trend: 07/29/2017    AT/AF       1 Year ICM trend:       [name], RN 07/29/2017 11:13 AM

## 2017-08-01 NOTE — Progress Notes (Signed)
Looks like he may have gone into atrial fibrillation. Would increase Lasix to 40 mg bid until he sees me in followup. Will need BMET 1 week.

## 2017-08-02 MED ORDER — FUROSEMIDE 40 MG PO TABS: 40.0000 mg | ORAL_TABLET | Freq: Two times a day (BID) | ORAL | 3 refills | Status: DC

## 2017-08-02 NOTE — Progress Notes (Addendum)
Attempted call to patient to advise of Dr Claris Gladden recommendations and left message for return call.

## 2017-08-02 NOTE — Addendum Note (Signed)
Addended by: [name] on: 08/02/2017 01:14 PM   Modules accepted: Orders

## 2017-08-02 NOTE — Progress Notes (Addendum)
Spoke with patient and advised Dr Aundra Dubin recommended he take Lasix 40 mg twice a day until his office visit on 08/06/2017 and will have lab work either at office visit or Monday, 08/09/17.  He does not need Lasix prescription filled at this time, he has enough supply on hand.  He verbalized understanding. Med list updated.

## 2017-08-06 ENCOUNTER — Ambulatory Visit (HOSPITAL_COMMUNITY)
Admission: RE | Admit: 2017-08-06 | Discharge: 2017-08-06 | Disposition: A | Discharge status: Home / Self Care | Payer: Medicare Other | Source: Ambulatory Visit | Attending: Internal Medicine | Admitting: Internal Medicine

## 2017-08-06 ENCOUNTER — Encounter (HOSPITAL_COMMUNITY): Payer: Self-pay | Admitting: Cardiology

## 2017-08-06 ENCOUNTER — Ambulatory Visit (HOSPITAL_BASED_OUTPATIENT_CLINIC_OR_DEPARTMENT_OTHER)
Admission: RE | Admit: 2017-08-06 | Discharge: 2017-08-06 | Disposition: A | Discharge status: Home / Self Care | Payer: Medicare Other | Source: Ambulatory Visit | Attending: Cardiology | Admitting: Cardiology

## 2017-08-06 ENCOUNTER — Other Ambulatory Visit (HOSPITAL_COMMUNITY): Payer: Self-pay

## 2017-08-06 ENCOUNTER — Telehealth (HOSPITAL_COMMUNITY): Payer: Self-pay | Admitting: Pharmacist

## 2017-08-06 ENCOUNTER — Encounter (HOSPITAL_COMMUNITY): Payer: Self-pay

## 2017-08-06 ENCOUNTER — Other Ambulatory Visit: Payer: Self-pay

## 2017-08-06 VITALS — BP 112/70 | HR 84 | Wt 240.5 lb

## 2017-08-06 DIAGNOSIS — Z7982 Long term (current) use of aspirin: Secondary | ICD-10-CM | POA: Insufficient documentation

## 2017-08-06 DIAGNOSIS — Z7984 Long term (current) use of oral hypoglycemic drugs: Secondary | ICD-10-CM | POA: Insufficient documentation

## 2017-08-06 DIAGNOSIS — I5022 Chronic systolic (congestive) heart failure: Secondary | ICD-10-CM

## 2017-08-06 DIAGNOSIS — E785 Hyperlipidemia, unspecified: Secondary | ICD-10-CM

## 2017-08-06 DIAGNOSIS — N183 Chronic kidney disease, stage 3 (moderate): Secondary | ICD-10-CM | POA: Diagnosis not present

## 2017-08-06 DIAGNOSIS — Z79899 Other long term (current) drug therapy: Secondary | ICD-10-CM | POA: Insufficient documentation

## 2017-08-06 DIAGNOSIS — E1140 Type 2 diabetes mellitus with diabetic neuropathy, unspecified: Secondary | ICD-10-CM | POA: Diagnosis not present

## 2017-08-06 DIAGNOSIS — I428 Other cardiomyopathies: Secondary | ICD-10-CM | POA: Insufficient documentation

## 2017-08-06 DIAGNOSIS — Z800 Family history of malignant neoplasm of digestive organs: Secondary | ICD-10-CM | POA: Insufficient documentation

## 2017-08-06 DIAGNOSIS — Z823 Family history of stroke: Secondary | ICD-10-CM | POA: Insufficient documentation

## 2017-08-06 DIAGNOSIS — I130 Hypertensive heart and chronic kidney disease with heart failure and stage 1 through stage 4 chronic kidney disease, or unspecified chronic kidney disease: Secondary | ICD-10-CM | POA: Insufficient documentation

## 2017-08-06 DIAGNOSIS — Z881 Allergy status to other antibiotic agents status: Secondary | ICD-10-CM | POA: Diagnosis not present

## 2017-08-06 DIAGNOSIS — F17210 Nicotine dependence, cigarettes, uncomplicated: Secondary | ICD-10-CM | POA: Diagnosis not present

## 2017-08-06 DIAGNOSIS — Z7901 Long term (current) use of anticoagulants: Secondary | ICD-10-CM | POA: Insufficient documentation

## 2017-08-06 DIAGNOSIS — R531 Weakness: Secondary | ICD-10-CM | POA: Insufficient documentation

## 2017-08-06 DIAGNOSIS — I483 Typical atrial flutter: Secondary | ICD-10-CM | POA: Diagnosis not present

## 2017-08-06 DIAGNOSIS — I2510 Atherosclerotic heart disease of native coronary artery without angina pectoris: Secondary | ICD-10-CM | POA: Insufficient documentation

## 2017-08-06 DIAGNOSIS — E1122 Type 2 diabetes mellitus with diabetic chronic kidney disease: Secondary | ICD-10-CM | POA: Insufficient documentation

## 2017-08-06 DIAGNOSIS — Z833 Family history of diabetes mellitus: Secondary | ICD-10-CM | POA: Insufficient documentation

## 2017-08-06 DIAGNOSIS — I480 Paroxysmal atrial fibrillation: Secondary | ICD-10-CM | POA: Diagnosis not present

## 2017-08-06 LAB — BASIC METABOLIC PANEL
ANION GAP: 10 (ref 5–15)
BUN: 40 mg/dL — ABNORMAL HIGH (ref 6–20)
CALCIUM: 9.1 mg/dL (ref 8.9–10.3)
CO2: 20 mmol/L — ABNORMAL LOW (ref 22–32)
Chloride: 107 mmol/L (ref 101–111)
Creatinine, Ser: 2.46 mg/dL — ABNORMAL HIGH (ref 0.61–1.24)
GFR, EST AFRICAN AMERICAN: 30 mL/min — AB (ref >60)
GFR, EST NON AFRICAN AMERICAN: 26 mL/min — AB (ref >60)
Glucose, Bld: 129 mg/dL — ABNORMAL HIGH (ref 65–99)
Potassium: 4.7 mmol/L (ref 3.5–5.1)
SODIUM: 137 mmol/L (ref 135–145)

## 2017-08-06 LAB — LIPID PANEL
CHOLESTEROL: 122 mg/dL (ref 0–200)
HDL: 26 mg/dL — ABNORMAL LOW (ref >40)
LDL Cholesterol: 78 mg/dL (ref 0–99)
TRIGLYCERIDES: 91 mg/dL (ref <150)
Total CHOL/HDL Ratio: 4.7 RATIO
VLDL: 18 mg/dL (ref 0–40)

## 2017-08-06 LAB — PROTIME-INR
INR: 2.76
Prothrombin Time: 29 seconds — ABNORMAL HIGH (ref 11.4–15.2)

## 2017-08-06 MED ORDER — APIXABAN 5 MG PO TABS: 5.0000 mg | ORAL_TABLET | Freq: Two times a day (BID) | ORAL | 11 refills | Status: DC

## 2017-08-06 NOTE — Progress Notes (Signed)
Verified that patient has [employer] so if switching to Eliquis can use 30 day free card and $10 copay card thereafter. Patient is willing to switch from warfarin to Eliquis so that he does not have to keep up with INR appointments. We discussed instructions for use, side effects and precautions. I will call him after INR results today with instructions for switching.   [employer]:  ID: [id number] BIN: [id number] PCN: [id number] GRP: [place] Velva Harman, PharmD, BCPS, CPP Clinical Pharmacist Phone: [phone] 08/06/2017 10:30 AM

## 2017-08-06 NOTE — Telephone Encounter (Signed)
With INR 2.76, will have Mr. Sanker hold warfarin x 2 nights and start Eliquis 5 mg BID on Sunday morning. Left VM with instructions for patient.   Ruta Hinds. Velva Harman, PharmD, BCPS, CPP Clinical Pharmacist Phone: [phone] 08/06/2017 12:13 PM

## 2017-08-06 NOTE — Progress Notes (Signed)
  Echocardiogram 2D Echocardiogram has been performed.  Jennette Dubin 08/06/2017, 8:56 AM

## 2017-08-06 NOTE — Patient Instructions (Addendum)
Labs drawn today (if we do not call you, then your lab work was stable)   Your physician has requested that you have a TEE/Cardioversion. During a TEE, sound waves are used to create images of your heart. It provides your doctor with information about the size and shape of your heart and how well your heart's chambers and valves are working. In this test, a transducer is attached to the end of a flexible tube that is guided down you throat and into your esophagus (the tube leading from your mouth to your stomach) to get a more detailed image of your heart. Once the TEE has determined that a blood clot is not present, the cardioversion begins. Electrical Cardioversion uses a jolt of electricity to your heart either through paddles or wired patches attached to your chest. This is a controlled, usually prescheduled, procedure. This procedure is done at the hospital and you are not awake during the procedure. You usually go home the day of the procedure. Please see the instruction sheet given to you today for more information.  Call to scheduled ABI's   Have [facility] INR Lab done 08/16/2017 before procedure   Your physician recommends that you schedule a follow-up appointment in: 2 weeks with Dr. Aundra Dubin after referral after cardioversion.

## 2017-08-07 NOTE — H&P (View-Only) (Signed)
Patient ID: Anthony Rubio., male   DOB: [birth year], 67 y.o.   MRN: [id number] PCP: Dr. Jerline Pain Cardiology: Dr. Aundra Dubin  67 y.o. with history of CAD, mixed ischemic/nonischemic cardiomyopathy, and diabetes who presents for followup. He initially was hospitalized in 5/11 with acute decompensated CHF after several days of shortness of breath, orthopnea, and PND. He was diuresed and left/right heart catheterization was done. Left heart cath showed large, ectatic coronaries with slow flow and moderate to severe diffuse distal vessel disease (consistent with diabetes) without good interventional options. EF was 15-20% by echo. He was put on milrinone and further diuresed for several more days. He was titrated off milrinone and discharged on an oral medication regimen. He later developed atrial flutter requiring cardioversion. He remains in NSR.  Patient had Medtronic ICD placed in 11/11.  Repeat echo in 10/12 showed EF 25%.  Most recent echo in 1/19 showed EF 25% with severe LV dilation.   He returns for followup today.  In general, has not been very compliant with treatment.  Says he "feels fine" when not taking medications. He is not very active due to severe diabetic neuropathy, uses a wheelchair when he leaves the house, holds on to furniture when walking around the house.  He is noted to be in atrial fibrillation today, device interrogation suggests that he has been in atrial fibrillation for 7-10 days. Looking back, his INR has not been well-controlled, he is often subtherapeutic. No chest pain.  No orthopnea/PND.  At last appointment, he was volume overloaded and I increased his Lasix to 40 mg bid and started him on Bidil. He says that the medications made him feel faint and dizzy, he cut back Lasix to 20 mg bid and Bidil to 1/2 tab bid.  He is not short of breath at rest or with mild exertion.  He is still smoking about 1 pack/week.   Medtronic device reviewed: Optivol with fluid index increasing towards  threshold, impedance decreasing.  He has been in persistent atrial fibrillation for about a week.   Labs (9/11): LDL 61, HDL 26, K 4.8, creatinine 1.6  Labs (10/11): K 4.3, creatinine 1.3, BNP 165  Labs (11/11): K 4.9, creatinine 1.6  Labs (12/11): K 4.8, creatinine 1.7  Labs (2/12): LDL 110, HDL 28  Labs (3/12): K 4.1, creatinine 1.7 Labs (6/12): K 4.4, creatinine 1.4, BNP 125 Labs (10/12): K 4.8, creatinine 1.4 Labs (12/12): K 4.4, creatinine 1.5, BNP 20 Labs (3/13): K 5, creatinine 1.5, LDL 43, HDL 31 Labs (8/13): K 4.2, creatinine 1.3, BNP 158 Labs (05/03/14): K 4.6 Creatinine 1.4  Labs (9/16): K 4.4, creatinine 1.5 Labs (5/18): K 4.7, creatinine 1.63, pro-BNP 5249, hgb 14.4 Labs (12/18): K 4.5, creatinine 1.99, LDL 127, HDL 28, hgb 16.1  ECG: Atypical flutter versus coarse afib, left axis deviation, IVCD 146 msec  Allergies:  1) ! Erythromycin   Past Medical History:  1. CAD: LHC (5/11) with 50-60% mid LAD, diffuse moderate distal LAD up to 60-70%, small OM2 subtotally occluded, moderate to severe diffuse distal PLOM disease, severe diffuse distal RCA, PDA, and PLV disease. Coronaries were ectatic with slow flow. Patient was started on Plavix given concern for thrombus formation in the ectatic coronaries. He stopped Plavix after starting coumadin with atrial flutter.  2. Chronic systolic CHF: Likely mixed ischemic/nonischemic. Echo (5/11) with EF 15-20%, severe global hypokinesis, mild MR, moderate diastolic dysfunction, mildly decreased RV systolic function. RHC (5/11) with mean RA 14 mmHg, PA 38/26, mean PCWP  25 mmHg, CI 1.8. Repeat echo (8/11) with EF 53-97%, severe diastolic dysfunction, mild MR, RV normal size and systolic function. Per official read, cannot rule out apical clot but it does appear to be only trabeculations. TEE (9/11): EF 15% with global hypokinesis, mild to moderate MR, no LV thrombus and no LAA thrombus.  Echo (10/12): EF 25%.  Echo (1/17) with EF 20-25%, severe  LV dilation, mild aortic stenosis with AVA 1.5 cm^2, moderate MR, moderately dilated RV with moderately decreased RV systolic function, PASP 50 mmHg. Medtronic ICD.  - Echo (1/19): EF 25%, severe LV dilation, mild RV dilation with severely decreased systolic function, mild-moderate MR.  3. Type II diabetes 4. HTN  5. Smoker  6. Severe diabetic neuropathy with chronic gait dysfunction  7. History of left foot ulcer with poor healing  8. Atrial flutter s/p DCCV 9/11  9. Aortic stenosis: Mild by echo in 1/17. 10. Atrial fibrillation: Paroxysmal.  11. CKD: Stage 3.  Suspect diabetic nephropathy.   Family History:  Family History of Colon CA 1st degree relative <60 9other relative)  Heart disease (grandparent)  Stroke (other relative)  dm only in secondary relatives   Social History:  Pt lives in [place]. 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    Current Outpatient Medications  Medication Sig Dispense Refill  . atorvastatin (LIPITOR) 80 MG tablet Take 1 tablet (80 mg total) by mouth daily at 6 PM. 30 tablet 3  . carvedilol (COREG) 6.25 MG tablet Take 3 tablets (18.75 mg total) by mouth 2 (two) times daily. 180 tablet 3  . furosemide (LASIX) 40 MG tablet Take 1 tablet (40 mg total) by mouth 2 (two) times daily. 180 tablet 3  . glipiZIDE (GLUCOTROL XL) 10 MG 24 hr tablet Take 1 tablet (10 mg total) daily by mouth. Must keep appt w/new provider in Jan for future refills 90 tablet 0  . glucose blood (RELION PRIME TEST) test strip Use to check blood sugars twice a day Dx 250.62 100 each 11  . isosorbide-hydrALAZINE (BIDIL) 20-37.5 MG tablet Take 0.5 tablets by mouth 2 (two) times daily.    . phenol (CHLORASEPTIC) 1.4 % LIQD Use as directed 1 spray in the mouth or throat as needed for throat irritation / pain. 177 mL 0  . RELION PRIME TEST test strip  USE TO CHECK BLOOD SUGAR TWICE A DAY. 100 each 11  . spironolactone (ALDACTONE) 25 MG tablet Take 1 tablet (25 mg total) by mouth daily. 90 tablet 2  . apixaban (ELIQUIS) 5 MG TABS tablet Take 1 tablet (5 mg total) by mouth 2 (two) times daily. 60 tablet 11   No current facility-administered medications for this encounter.     BP 112/70   Pulse 84   Wt 240 lb 8 oz (109.1 kg)   SpO2 95%   BMI 30.06 kg/m  General: NAD Neck: JVP 8 cm, no thyromegaly or thyroid nodule.  Lungs: Clear to auscultation bilaterally with normal respiratory effort. CV: Nondisplaced PMI.  Heart irregular S1/S2, no S3/S4, no murmur.  No peripheral edema.  No carotid bruit.  Weak PT pulses.   Abdomen: Soft, nontender, no hepatosplenomegaly, no distention.  Skin: Intact without lesions or rashes.  Neurologic: Alert and oriented x 3.  Psych: Normal affect. Extremities: No clubbing or cyanosis.  HEENT: Normal.   Assessment/Plan:  Coarse atrial fibrillation versus atypical flutter  Prior atrial flutter in 2011.  He has been in atypical flutter versus coarse fib for about 7-10 days now.  Increased volume by Optivol appears to track with atrial fibrillation.  He is also volume overloaded by exam today.  He is on warfarin but very rarely has a therapeutic INR.  - I do not think that warfarin is a good anticoagulant for him (rarely therapeutic).  I am going to switch him to Eliquis, his dose will be 5 mg bid.  - I do not want to leave him in atrial fibrillation for a long period with worsening of volume overload.  We discussed TEE-guided DCCV.  I will arrange for this next week.  We discussed risks/benefits and he agrees to procedure.   - He may not stay in NSR long-term after cardioversion given cardiomyopathy and likely would be best served by Tikosyn or ablation (based on CASTLE-HF), but I am not sure that he would agree to either.  After DCCV, I will discuss referral to EP with him for ablation consideration.  CHRONIC  KIDNEY DISEASE STAGE III  Suspect diabetic nephropathy. BMET today.   Chronic systolic heart failure  Mixed ischemic/nonischemic cardiomyopathy. Has Medtronic ICD.  Last echo in 1/19 with EF 25%, severely dilated LV, severely decreased RV systolic function. He has significant distal vessel CAD on prior cath as well as a history of poorly controlled diabetes. He has NYHA class III symptoms though not very active due to severe peripheral neuropathy.  He is volume overloaded by exam and Optivol, temporally this appears to have been triggered by going into atrial fibrillation.  - Continue current Coreg.  - He was unable to tolerate even low dose losartan.  - Continue Bidil at 1/2 tab bid, he does not think he can tolerate more. - Continue spironolactone 25 mg daily.  - He does not want to take more than 20 mg bid Lasix.  Hopefully, with restoration of NSR volume status will improve over time.  CORONARY ARTERY DISEASE Diffuse moderate to severe distal vessel disease on last cath with ectatic coronaries with slow flow. No interventional target.  No chest pain.  - Continue ASA 81 daily.  - LDL too high recently, atorvastatin increased to 80 mg daily. Lipids/LFTs in 2 months.  SMOKING I strongly encouraged him to quit smoking.  LEG WEAKNESS Likely due to diabetic neuropathy, but would like to get peripheral arterial dopplers to rule out significant PAD.   Followup in 2 weeks after TEE-DCCV.   [name] 08/07/2017

## 2017-08-07 NOTE — Progress Notes (Signed)
Patient ID: Anthony Rubio., male   DOB: [birth year], 67 y.o.   MRN: [id number] PCP: Dr. Jerline Pain Cardiology: Dr. Aundra Dubin  67 y.o. with history of CAD, mixed ischemic/nonischemic cardiomyopathy, and diabetes who presents for followup. He initially was hospitalized in 5/11 with acute decompensated CHF after several days of shortness of breath, orthopnea, and PND. He was diuresed and left/right heart catheterization was done. Left heart cath showed large, ectatic coronaries with slow flow and moderate to severe diffuse distal vessel disease (consistent with diabetes) without good interventional options. EF was 15-20% by echo. He was put on milrinone and further diuresed for several more days. He was titrated off milrinone and discharged on an oral medication regimen. He later developed atrial flutter requiring cardioversion. He remains in NSR.  Patient had Medtronic ICD placed in 11/11.  Repeat echo in 10/12 showed EF 25%.  Most recent echo in 1/19 showed EF 25% with severe LV dilation.   He returns for followup today.  In general, has not been very compliant with treatment.  Says he "feels fine" when not taking medications. He is not very active due to severe diabetic neuropathy, uses a wheelchair when he leaves the house, holds on to furniture when walking around the house.  He is noted to be in atrial fibrillation today, device interrogation suggests that he has been in atrial fibrillation for 7-10 days. Looking back, his INR has not been well-controlled, he is often subtherapeutic. No chest pain.  No orthopnea/PND.  At last appointment, he was volume overloaded and I increased his Lasix to 40 mg bid and started him on Bidil. He says that the medications made him feel faint and dizzy, he cut back Lasix to 20 mg bid and Bidil to 1/2 tab bid.  He is not short of breath at rest or with mild exertion.  He is still smoking about 1 pack/week.   Medtronic device reviewed: Optivol with fluid index increasing towards  threshold, impedance decreasing.  He has been in persistent atrial fibrillation for about a week.   Labs (9/11): LDL 61, HDL 26, K 4.8, creatinine 1.6  Labs (10/11): K 4.3, creatinine 1.3, BNP 165  Labs (11/11): K 4.9, creatinine 1.6  Labs (12/11): K 4.8, creatinine 1.7  Labs (2/12): LDL 110, HDL 28  Labs (3/12): K 4.1, creatinine 1.7 Labs (6/12): K 4.4, creatinine 1.4, BNP 125 Labs (10/12): K 4.8, creatinine 1.4 Labs (12/12): K 4.4, creatinine 1.5, BNP 20 Labs (3/13): K 5, creatinine 1.5, LDL 43, HDL 31 Labs (8/13): K 4.2, creatinine 1.3, BNP 158 Labs (05/03/14): K 4.6 Creatinine 1.4  Labs (9/16): K 4.4, creatinine 1.5 Labs (5/18): K 4.7, creatinine 1.63, pro-BNP 5249, hgb 14.4 Labs (12/18): K 4.5, creatinine 1.99, LDL 127, HDL 28, hgb 16.1  ECG: Atypical flutter versus coarse afib, left axis deviation, IVCD 146 msec  Allergies:  1) ! Erythromycin   Past Medical History:  1. CAD: LHC (5/11) with 50-60% mid LAD, diffuse moderate distal LAD up to 60-70%, small OM2 subtotally occluded, moderate to severe diffuse distal PLOM disease, severe diffuse distal RCA, PDA, and PLV disease. Coronaries were ectatic with slow flow. Patient was started on Plavix given concern for thrombus formation in the ectatic coronaries. He stopped Plavix after starting coumadin with atrial flutter.  2. Chronic systolic CHF: Likely mixed ischemic/nonischemic. Echo (5/11) with EF 15-20%, severe global hypokinesis, mild MR, moderate diastolic dysfunction, mildly decreased RV systolic function. RHC (5/11) with mean RA 14 mmHg, PA 38/26, mean PCWP  25 mmHg, CI 1.8. Repeat echo (8/11) with EF 89-21%, severe diastolic dysfunction, mild MR, RV normal size and systolic function. Per official read, cannot rule out apical clot but it does appear to be only trabeculations. TEE (9/11): EF 15% with global hypokinesis, mild to moderate MR, no LV thrombus and no LAA thrombus.  Echo (10/12): EF 25%.  Echo (1/17) with EF 20-25%, severe  LV dilation, mild aortic stenosis with AVA 1.5 cm^2, moderate MR, moderately dilated RV with moderately decreased RV systolic function, PASP 50 mmHg. Medtronic ICD.  - Echo (1/19): EF 25%, severe LV dilation, mild RV dilation with severely decreased systolic function, mild-moderate MR.  3. Type II diabetes 4. HTN  5. Smoker  6. Severe diabetic neuropathy with chronic gait dysfunction  7. History of left foot ulcer with poor healing  8. Atrial flutter s/p DCCV 9/11  9. Aortic stenosis: Mild by echo in 1/17. 10. Atrial fibrillation: Paroxysmal.  11. CKD: Stage 3.  Suspect diabetic nephropathy.   Family History:  Family History of Colon CA 1st degree relative <60 9other relative)  Heart disease (grandparent)  Stroke (other relative)  dm only in secondary relatives   Social History:  Pt lives in [place]. 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    Current Outpatient Medications  Medication Sig Dispense Refill  . atorvastatin (LIPITOR) 80 MG tablet Take 1 tablet (80 mg total) by mouth daily at 6 PM. 30 tablet 3  . carvedilol (COREG) 6.25 MG tablet Take 3 tablets (18.75 mg total) by mouth 2 (two) times daily. 180 tablet 3  . furosemide (LASIX) 40 MG tablet Take 1 tablet (40 mg total) by mouth 2 (two) times daily. 180 tablet 3  . glipiZIDE (GLUCOTROL XL) 10 MG 24 hr tablet Take 1 tablet (10 mg total) daily by mouth. Must keep appt w/new provider in Jan for future refills 90 tablet 0  . glucose blood (RELION PRIME TEST) test strip Use to check blood sugars twice a day Dx 250.62 100 each 11  . isosorbide-hydrALAZINE (BIDIL) 20-37.5 MG tablet Take 0.5 tablets by mouth 2 (two) times daily.    . phenol (CHLORASEPTIC) 1.4 % LIQD Use as directed 1 spray in the mouth or throat as needed for throat irritation / pain. 177 mL 0  . RELION PRIME TEST test strip  USE TO CHECK BLOOD SUGAR TWICE A DAY. 100 each 11  . spironolactone (ALDACTONE) 25 MG tablet Take 1 tablet (25 mg total) by mouth daily. 90 tablet 2  . apixaban (ELIQUIS) 5 MG TABS tablet Take 1 tablet (5 mg total) by mouth 2 (two) times daily. 60 tablet 11   No current facility-administered medications for this encounter.     BP 112/70   Pulse 84   Wt 240 lb 8 oz (109.1 kg)   SpO2 95%   BMI 30.06 kg/m  General: NAD Neck: JVP 8 cm, no thyromegaly or thyroid nodule.  Lungs: Clear to auscultation bilaterally with normal respiratory effort. CV: Nondisplaced PMI.  Heart irregular S1/S2, no S3/S4, no murmur.  No peripheral edema.  No carotid bruit.  Weak PT pulses.   Abdomen: Soft, nontender, no hepatosplenomegaly, no distention.  Skin: Intact without lesions or rashes.  Neurologic: Alert and oriented x 3.  Psych: Normal affect. Extremities: No clubbing or cyanosis.  HEENT: Normal.   Assessment/Plan:  Coarse atrial fibrillation versus atypical flutter  Prior atrial flutter in 2011.  He has been in atypical flutter versus coarse fib for about 7-10 days now.  Increased volume by Optivol appears to track with atrial fibrillation.  He is also volume overloaded by exam today.  He is on warfarin but very rarely has a therapeutic INR.  - I do not think that warfarin is a good anticoagulant for him (rarely therapeutic).  I am going to switch him to Eliquis, his dose will be 5 mg bid.  - I do not want to leave him in atrial fibrillation for a long period with worsening of volume overload.  We discussed TEE-guided DCCV.  I will arrange for this next week.  We discussed risks/benefits and he agrees to procedure.   - He may not stay in NSR long-term after cardioversion given cardiomyopathy and likely would be best served by Tikosyn or ablation (based on CASTLE-HF), but I am not sure that he would agree to either.  After DCCV, I will discuss referral to EP with him for ablation consideration.  CHRONIC  KIDNEY DISEASE STAGE III  Suspect diabetic nephropathy. BMET today.   Chronic systolic heart failure  Mixed ischemic/nonischemic cardiomyopathy. Has Medtronic ICD.  Last echo in 1/19 with EF 25%, severely dilated LV, severely decreased RV systolic function. He has significant distal vessel CAD on prior cath as well as a history of poorly controlled diabetes. He has NYHA class III symptoms though not very active due to severe peripheral neuropathy.  He is volume overloaded by exam and Optivol, temporally this appears to have been triggered by going into atrial fibrillation.  - Continue current Coreg.  - He was unable to tolerate even low dose losartan.  - Continue Bidil at 1/2 tab bid, he does not think he can tolerate more. - Continue spironolactone 25 mg daily.  - He does not want to take more than 20 mg bid Lasix.  Hopefully, with restoration of NSR volume status will improve over time.  CORONARY ARTERY DISEASE Diffuse moderate to severe distal vessel disease on last cath with ectatic coronaries with slow flow. No interventional target.  No chest pain.  - Continue ASA 81 daily.  - LDL too high recently, atorvastatin increased to 80 mg daily. Lipids/LFTs in 2 months.  SMOKING I strongly encouraged him to quit smoking.  LEG WEAKNESS Likely due to diabetic neuropathy, but would like to get peripheral arterial dopplers to rule out significant PAD.   Followup in 2 weeks after TEE-DCCV.   [name] 08/07/2017

## 2017-08-10 ENCOUNTER — Encounter (HOSPITAL_COMMUNITY): Payer: Self-pay

## 2017-08-11 ENCOUNTER — Telehealth (HOSPITAL_COMMUNITY): Payer: Self-pay | Admitting: Pharmacist

## 2017-08-11 ENCOUNTER — Telehealth: Payer: Self-pay | Admitting: *Deleted

## 2017-08-11 NOTE — Telephone Encounter (Signed)
Received call from Ileene Patrick PharmD and she states she has called pt and is aware that he stayed on his coumadin and she asks that we make an appt for him to have INR checked prior to cardioversion on Monday Feb 11th  This nurse called and spoke with pt and made appt for him to be seen in coumadin clinic on Monday Feb 11th and he states understanding

## 2017-08-11 NOTE — Telephone Encounter (Signed)
Patient was unable to switch to Eliquis because he actually does not have any Rx insurance (I was under the impression that they did have commercial Rx insurance). He stated that he continued his Coumadin as prescribed by the Coumadin clinic. Spoke with Ivin Booty at [facility] who will reach out to patient to schedule an INR check on the morning of his DCCV (Monday 2/11). Relayed info to both Mr. And Mrs. Reffett.   Ruta Hinds. Velva Harman, PharmD, BCPS, CPP Clinical Pharmacist Phone: [phone] 08/11/2017 4:39 PM

## 2017-08-16 ENCOUNTER — Telehealth (HOSPITAL_COMMUNITY): Payer: Self-pay | Admitting: Pharmacist

## 2017-08-16 ENCOUNTER — Encounter (HOSPITAL_COMMUNITY): Admission: RE | Payer: Self-pay | Source: Ambulatory Visit

## 2017-08-16 ENCOUNTER — Ambulatory Visit (HOSPITAL_COMMUNITY): Admission: RE | Admit: 2017-08-16 | Payer: Medicare Other | Source: Ambulatory Visit | Admitting: Cardiology

## 2017-08-16 ENCOUNTER — Encounter: Payer: Self-pay | Admitting: Family Medicine

## 2017-08-16 ENCOUNTER — Ambulatory Visit (INDEPENDENT_AMBULATORY_CARE_PROVIDER_SITE_OTHER): Payer: Medicare Other | Admitting: *Deleted

## 2017-08-16 ENCOUNTER — Telehealth: Payer: Self-pay | Admitting: Family Medicine

## 2017-08-16 ENCOUNTER — Telehealth (HOSPITAL_COMMUNITY): Payer: Self-pay

## 2017-08-16 DIAGNOSIS — I483 Typical atrial flutter: Secondary | ICD-10-CM

## 2017-08-16 DIAGNOSIS — Z5181 Encounter for therapeutic drug level monitoring: Secondary | ICD-10-CM

## 2017-08-16 DIAGNOSIS — R195 Other fecal abnormalities: Secondary | ICD-10-CM

## 2017-08-16 LAB — PROTIME-INR
INR: 7 — AB (ref 0.8–1.2)
PROTHROMBIN TIME: 74.5 s — AB (ref 9.1–12.0)

## 2017-08-16 LAB — POCT INR: INR: 7.6

## 2017-08-16 SURGERY — CARDIOVERSION
Anesthesia: General

## 2017-08-16 NOTE — Telephone Encounter (Signed)
Patient's cologuard is positive. This is most likely due to him being on warfarin and having a small amount of blood in his stool, but he needs a colonoscopy to make sure he does not have any signs of colon cancer.   Please inform patient of above and place GI referral if he is willing. I am happy to see him for an office visit if he has further questions about his test.  Algis Greenhouse. Jerline Pain, MD 08/16/2017 1:21 PM

## 2017-08-16 NOTE — Telephone Encounter (Signed)
Have him switch back to Imdur 15 mg daily with hydralazine 10 mg tid.

## 2017-08-16 NOTE — Telephone Encounter (Signed)
Spoke with patient and relayed rescheduled DCCV information to him.   Ruta Hinds. Velva Harman, PharmD, BCPS, CPP Clinical Pharmacist Phone: [phone] 08/16/2017 3:25 PM

## 2017-08-16 NOTE — Telephone Encounter (Signed)
Due to Pt INR this AM being a 7, Dr. Aundra Dubin  Wants pt rescheduled for TEE/DCCV it was rescheduled for 2/18 at 11 am. Left wife a message to call back.

## 2017-08-16 NOTE — Telephone Encounter (Signed)
Anthony Rubio states that since switching from isosorbide mononitrate 30 mg daily to Bidil 0.5 tabs TID on 07/25/17, he has been excessively dizzy, lightheaded and confused. He is asking if he can switch back to Imdur. It looks like he hadn't been taking hydralazine since 2015 2/2 cost issues? Will forward to Dr. Aundra Dubin for recommendation.   Ruta Hinds. Velva Harman, PharmD, BCPS, CPP Clinical Pharmacist Phone: [phone] 08/16/2017 3:23 PM

## 2017-08-16 NOTE — Patient Instructions (Signed)
Description   Spoke with pt & instructed pt not to take any Coumadin today, No Coumadin tomorrow, and No Coumadin Wednesday then recheck INR on Thursday. Call if any questions or problems [phone]

## 2017-08-17 MED ORDER — ISOSORBIDE MONONITRATE ER 30 MG PO TB24: 15.0000 mg | ORAL_TABLET | Freq: Every day | ORAL | 5 refills | Status: DC

## 2017-08-17 MED ORDER — HYDRALAZINE HCL 10 MG PO TABS: 10.0000 mg | ORAL_TABLET | Freq: Three times a day (TID) | ORAL | 5 refills | Status: DC

## 2017-08-17 NOTE — Addendum Note (Signed)
Addended by: Marian Sorrow on: 08/17/2017 10:16 AM   Modules accepted: Orders

## 2017-08-17 NOTE — Telephone Encounter (Signed)
Spoke with Mrs. Scheibel about change back to low dose hydralazine and Imdur. She verbalized understanding and was grateful for the assistance.   Ruta Hinds. Velva Harman, PharmD, BCPS, CPP Clinical Pharmacist Phone: [phone] 08/17/2017 10:44 AM

## 2017-08-17 NOTE — Addendum Note (Signed)
Addended by: Adora Fridge on: 08/17/2017 10:44 AM   Modules accepted: Orders

## 2017-08-17 NOTE — Telephone Encounter (Signed)
Spoke to pt, told him Cologuard came back positive. This is most likely due to you being on warfarin and having a small amount of blood in his stool, but he needs a colonoscopy to make sure he does not have any signs of colon cancer per Dr. Jerline Pain. Pt verbalized understanding. Told pt referral was put in for GI and someone will be contacting you to schedule an appt. Pt verbalized understanding.

## 2017-08-19 ENCOUNTER — Ambulatory Visit (INDEPENDENT_AMBULATORY_CARE_PROVIDER_SITE_OTHER): Payer: Medicare Other | Admitting: *Deleted

## 2017-08-19 DIAGNOSIS — Z5181 Encounter for therapeutic drug level monitoring: Secondary | ICD-10-CM

## 2017-08-19 DIAGNOSIS — I483 Typical atrial flutter: Secondary | ICD-10-CM | POA: Diagnosis not present

## 2017-08-19 LAB — POCT INR: INR: 3.2

## 2017-08-19 NOTE — Patient Instructions (Signed)
Description   Restart coumadin  same dose  1 and 1/2 tablets (7.5mg )  Daily . Call if any questions or problems [phone] Recheck INR on Monday Feb 18th prior to TEE and Cardioversion

## 2017-08-23 ENCOUNTER — Ambulatory Visit (HOSPITAL_BASED_OUTPATIENT_CLINIC_OR_DEPARTMENT_OTHER): Payer: Medicare Other

## 2017-08-23 ENCOUNTER — Encounter (HOSPITAL_COMMUNITY)
Admission: RE | Disposition: A | Discharge status: Home / Self Care | Payer: Self-pay | Source: Ambulatory Visit | Attending: Cardiology

## 2017-08-23 ENCOUNTER — Ambulatory Visit (INDEPENDENT_AMBULATORY_CARE_PROVIDER_SITE_OTHER): Payer: Medicare Other | Admitting: *Deleted

## 2017-08-23 ENCOUNTER — Other Ambulatory Visit: Payer: Self-pay

## 2017-08-23 ENCOUNTER — Ambulatory Visit (HOSPITAL_COMMUNITY): Payer: Medicare Other | Admitting: Anesthesiology

## 2017-08-23 ENCOUNTER — Ambulatory Visit (INDEPENDENT_AMBULATORY_CARE_PROVIDER_SITE_OTHER): Payer: Medicare Other

## 2017-08-23 ENCOUNTER — Encounter (HOSPITAL_COMMUNITY): Payer: Self-pay | Admitting: *Deleted

## 2017-08-23 ENCOUNTER — Ambulatory Visit (HOSPITAL_COMMUNITY)
Admission: RE | Admit: 2017-08-23 | Discharge: 2017-08-23 | Disposition: A | Discharge status: Home / Self Care | Payer: Medicare Other | Source: Ambulatory Visit | Attending: Cardiology | Admitting: Cardiology

## 2017-08-23 DIAGNOSIS — Z95810 Presence of automatic (implantable) cardiac defibrillator: Secondary | ICD-10-CM | POA: Diagnosis not present

## 2017-08-23 DIAGNOSIS — I340 Nonrheumatic mitral (valve) insufficiency: Secondary | ICD-10-CM | POA: Diagnosis not present

## 2017-08-23 DIAGNOSIS — I480 Paroxysmal atrial fibrillation: Secondary | ICD-10-CM | POA: Insufficient documentation

## 2017-08-23 DIAGNOSIS — Z7984 Long term (current) use of oral hypoglycemic drugs: Secondary | ICD-10-CM | POA: Diagnosis not present

## 2017-08-23 DIAGNOSIS — I2510 Atherosclerotic heart disease of native coronary artery without angina pectoris: Secondary | ICD-10-CM | POA: Diagnosis not present

## 2017-08-23 DIAGNOSIS — R2689 Other abnormalities of gait and mobility: Secondary | ICD-10-CM | POA: Diagnosis not present

## 2017-08-23 DIAGNOSIS — Z881 Allergy status to other antibiotic agents status: Secondary | ICD-10-CM | POA: Insufficient documentation

## 2017-08-23 DIAGNOSIS — Z7901 Long term (current) use of anticoagulants: Secondary | ICD-10-CM | POA: Insufficient documentation

## 2017-08-23 DIAGNOSIS — I739 Peripheral vascular disease, unspecified: Secondary | ICD-10-CM | POA: Diagnosis not present

## 2017-08-23 DIAGNOSIS — Z823 Family history of stroke: Secondary | ICD-10-CM | POA: Diagnosis not present

## 2017-08-23 DIAGNOSIS — Z8249 Family history of ischemic heart disease and other diseases of the circulatory system: Secondary | ICD-10-CM | POA: Insufficient documentation

## 2017-08-23 DIAGNOSIS — Z800 Family history of malignant neoplasm of digestive organs: Secondary | ICD-10-CM | POA: Diagnosis not present

## 2017-08-23 DIAGNOSIS — E1122 Type 2 diabetes mellitus with diabetic chronic kidney disease: Secondary | ICD-10-CM | POA: Insufficient documentation

## 2017-08-23 DIAGNOSIS — N183 Chronic kidney disease, stage 3 (moderate): Secondary | ICD-10-CM | POA: Insufficient documentation

## 2017-08-23 DIAGNOSIS — E1140 Type 2 diabetes mellitus with diabetic neuropathy, unspecified: Secondary | ICD-10-CM | POA: Diagnosis not present

## 2017-08-23 DIAGNOSIS — I5022 Chronic systolic (congestive) heart failure: Secondary | ICD-10-CM

## 2017-08-23 DIAGNOSIS — I130 Hypertensive heart and chronic kidney disease with heart failure and stage 1 through stage 4 chronic kidney disease, or unspecified chronic kidney disease: Secondary | ICD-10-CM | POA: Insufficient documentation

## 2017-08-23 DIAGNOSIS — I4891 Unspecified atrial fibrillation: Secondary | ICD-10-CM

## 2017-08-23 DIAGNOSIS — I255 Ischemic cardiomyopathy: Secondary | ICD-10-CM

## 2017-08-23 DIAGNOSIS — F17210 Nicotine dependence, cigarettes, uncomplicated: Secondary | ICD-10-CM | POA: Diagnosis not present

## 2017-08-23 DIAGNOSIS — Z5181 Encounter for therapeutic drug level monitoring: Secondary | ICD-10-CM | POA: Diagnosis not present

## 2017-08-23 DIAGNOSIS — I483 Typical atrial flutter: Secondary | ICD-10-CM | POA: Diagnosis not present

## 2017-08-23 DIAGNOSIS — E785 Hyperlipidemia, unspecified: Secondary | ICD-10-CM | POA: Diagnosis not present

## 2017-08-23 HISTORY — PX: TEE WITHOUT CARDIOVERSION: SHX5443

## 2017-08-23 HISTORY — PX: CARDIOVERSION: SHX1299

## 2017-08-23 LAB — GLUCOSE, CAPILLARY: GLUCOSE-CAPILLARY: 143 mg/dL — AB (ref 65–99)

## 2017-08-23 LAB — POCT INR: INR: 2.9

## 2017-08-23 SURGERY — ECHOCARDIOGRAM, TRANSESOPHAGEAL
Anesthesia: General

## 2017-08-23 MED ORDER — SODIUM CHLORIDE 0.9 % IV SOLN
INTRAVENOUS | Status: DC | PRN
  Administered 2017-08-23: 10:00:00 via INTRAVENOUS

## 2017-08-23 MED ORDER — EPHEDRINE SULFATE 50 MG/ML IJ SOLN
INTRAMUSCULAR | Status: DC | PRN
  Administered 2017-08-23 (×5): 10 mg via INTRAVENOUS

## 2017-08-23 MED ORDER — PROPOFOL 10 MG/ML IV BOLUS
INTRAVENOUS | Status: DC | PRN
  Administered 2017-08-23 (×2): 10 mg via INTRAVENOUS

## 2017-08-23 MED ORDER — DEXTROSE 5 % IV SOLN
INTRAVENOUS | Status: DC | PRN
  Administered 2017-08-23: 25 ug/min via INTRAVENOUS

## 2017-08-23 MED ORDER — LIDOCAINE HCL (CARDIAC) 20 MG/ML IV SOLN
INTRAVENOUS | Status: DC | PRN
  Administered 2017-08-23: 40 mg via INTRATRACHEAL

## 2017-08-23 MED ORDER — BUTAMBEN-TETRACAINE-BENZOCAINE 2-2-14 % EX AERO
INHALATION_SPRAY | CUTANEOUS | Status: DC | PRN
  Administered 2017-08-23: 2 via TOPICAL

## 2017-08-23 MED ORDER — PROPOFOL 500 MG/50ML IV EMUL
INTRAVENOUS | Status: DC | PRN
  Administered 2017-08-23: 25 ug/kg/min via INTRAVENOUS

## 2017-08-23 MED ORDER — FUROSEMIDE 40 MG PO TABS: 20.0000 mg | ORAL_TABLET | Freq: Two times a day (BID) | ORAL | 3 refills | Status: DC

## 2017-08-23 NOTE — Patient Instructions (Signed)
Description   Continue same dose of  coumadin    1 and 1/2 tablets (7.5mg )  Daily . Call if any questions or problems [phone] Recheck INR 1 week after cardioversion

## 2017-08-23 NOTE — Anesthesia Procedure Notes (Signed)
Procedure Name: MAC Date/Time: 08/23/2017 10:58 AM Performed by: Lavell Luster, CRNA Pre-anesthesia Checklist: Patient identified, Emergency Drugs available, Suction available, Patient being monitored and Timeout performed Patient Re-evaluated:Patient Re-evaluated prior to induction Oxygen Delivery Method: Circle system utilized Preoxygenation: Pre-oxygenation with 100% oxygen Induction Type: IV induction Ventilation: Mask ventilation without difficulty Placement Confirmation: breath sounds checked- equal and bilateral and positive ETCO2 Dental Injury: Teeth and Oropharynx as per pre-operative assessment  Comments: Assisted ventilation after cardioverted as pt was obstructing and O2 sats low.  Suctioned several times and moving air well.  Ellender at bedside treating hypotension.  Pt awake in endo recovery, wife at bedside.  Henderson Cloud, CRNA

## 2017-08-23 NOTE — Progress Notes (Signed)
Patient's wife noted small red rash like appearance on patient;s forehead, nose and under his eyes. Dr. Aundra Dubin made aware. Patient and wife instructed to keep an eye on it and if it gets worse to call Dr. Claris Gladden office. Patient and wife comfortable with being discharged.

## 2017-08-23 NOTE — Transfer of Care (Signed)
Immediate Anesthesia Transfer of Care Note  Patient: Anthony Rubio.  Procedure(s) Performed: TRANSESOPHAGEAL ECHOCARDIOGRAM (TEE) (N/A ) CARDIOVERSION (N/A )  Patient Location: Endoscopy Unit  Anesthesia Type:General  Level of Consciousness: awake, alert  and oriented  Airway & Oxygen Therapy: Patient connected to face mask oxygen  Post-op Assessment: Post -op Vital signs reviewed and stable  Post vital signs: stable  Last Vitals:  Vitals:   08/23/17 1125 08/23/17 1130  BP:    Pulse:  80  Resp:  20  Temp:    SpO2: 98% 100%    Last Pain:  Vitals:   08/23/17 1122  TempSrc: Oral         Complications: respiratory complications and cardiovascular complications

## 2017-08-23 NOTE — CV Procedure (Signed)
Procedure: TEE  Sedation: Per anesthesiology  Indication: atrial fibrillation  Findings: Please see echo section for full report.  Moderately dilated LV with severe diffuse hypokinesis, EF 25%.  Mildly dilated RV with moderately reduced RV systolic function.  Pacemaker in RV.  Moderately dilated left atrium with smoke in the LA appendage but no thrombus.  Moderate right atrial dilation.   The interatrial septum is shifted right.  No PFO/ASD by color doppler.  Moderate tricuspid regurgitation with peak RV-RA gradient 56 mmHg. Mild mitral regurgitation.  The aortic valve was moderately calcified.  It appears to be functionally bicuspid with fusion of the left and right coronary cusps.  Normal caliber thoracic aorta with minimal plaque.   May proceed to DCCV.  [name] 08/23/2017 11:04 AM

## 2017-08-23 NOTE — Interval H&P Note (Signed)
History and Physical Interval Note:  08/23/2017 10:41 AM  Anthony Rubio.  has presented today for surgery, with the diagnosis of AFIB  The various methods of treatment have been discussed with the patient and family. After consideration of risks, benefits and other options for treatment, the patient has consented to  Procedure(s): TRANSESOPHAGEAL ECHOCARDIOGRAM (TEE) (N/A) CARDIOVERSION (N/A) as a surgical intervention .  The patient's history has been reviewed, patient examined, no change in status, stable for surgery.  I have reviewed the patient's chart and labs.  Questions were answered to the patient's satisfaction.     Dalton [employer]

## 2017-08-23 NOTE — Progress Notes (Signed)
  Echocardiogram Echocardiogram Transesophageal has been performed.  Bobbye Charleston 08/23/2017, 11:08 AM

## 2017-08-23 NOTE — Anesthesia Preprocedure Evaluation (Addendum)
Anesthesia Evaluation  Patient identified by MRN, date of birth, ID band Patient awake    Reviewed: Allergy & Precautions, NPO status , Patient's Chart, lab work & pertinent test results  Airway Mallampati: II  TM Distance: >3 FB Neck ROM: Full    Dental  (+) Poor Dentition, Missing, Chipped,    Pulmonary Current Smoker,    Pulmonary exam normal breath sounds clear to auscultation       Cardiovascular hypertension, Pt. on home beta blockers + CAD and +CHF  Normal cardiovascular exam+ dysrhythmias Atrial Fibrillation + Cardiac Defibrillator + Valvular Problems/Murmurs MR  Rhythm:Regular Rate:Normal  ECG: A-flutter, rate 76  ECHO:  The patient is in atrial fibrillation. Severely dilated LV with EF 25%. Mildly dilated RV with mild to moderately decreased systolic function. Mild to moderate MR. Aortic valve sclerosis without significant stenosis. Mild pulmonary hypertension.    Neuro/Psych negative neurological ROS  negative psych ROS   GI/Hepatic negative GI ROS, Neg liver ROS,   Endo/Other  diabetes, Oral Hypoglycemic Agents  Renal/GU Renal disease     Musculoskeletal negative musculoskeletal ROS (+)   Abdominal   Peds  Hematology HLD   Anesthesia Other Findings   Reproductive/Obstetrics                            Anesthesia Physical Anesthesia Plan  ASA: III  Anesthesia Plan: General   Post-op Pain Management:    Induction: Intravenous  PONV Risk Score and Plan: 1 and Propofol infusion and Treatment may vary due to age or medical condition  Airway Management Planned: Natural Airway  Additional Equipment:   Intra-op Plan:   Post-operative Plan:   Informed Consent: I have reviewed the patients History and Physical, chart, labs and discussed the procedure including the risks, benefits and alternatives for the proposed anesthesia with the patient or authorized representative  who has indicated his/her understanding and acceptance.   Dental advisory given  Plan Discussed with: CRNA  Anesthesia Plan Comments:         Anesthesia Quick Evaluation

## 2017-08-23 NOTE — Progress Notes (Signed)
Remote ICD transmission.   

## 2017-08-23 NOTE — Progress Notes (Signed)
EPIC Encounter for ICM Monitoring  Patient Name: Anthony Rubio. is a 67 y.o. male Date: 08/23/2017 Primary Care Physican: Vivi Barrack, MD Primary Cardiologist:[place] Electrophysiologist: Allred Dry Weight: 237lbs      Patient had cardioversion 08/23/2017.       Spoke with wife and patient is symptomatic with feeling he is not getting enough air since procedure yesterday.     Thoracic impedance abnormal suggesting fluid accumulation.  Prescribed dosage: Furosemide to 40 mg 1 tablet every AM and 0.5 tablet every PM  Labs: 06/18/2017 Creatinine1.99, BUN36, Potassium4.5, Sodium139, [id number]  05/20/2017 Creatinine1.53, BUN28, Potassium5.0, [id number], [id number]  12/02/2016 Creatinine1.63, BUN24, Potassium4.7, Sodium139, [id number] A complete set of results can be found in Results Review.  Recommendations: Advised will send to Dr Rayann Heman and Dr Aundra Dubin for review and will call back with any recommendations.  Advised if patient worsens to call Dr Claris Gladden office directly or use ER if needed.   Follow-up plan: ICM clinic phone appointment on 09/06/2017.  Office appointment scheduled 08/31/2017 with Dr. Aundra Dubin.  Copy of ICM check sent to Dr. Aundra Dubin and Dr. Rayann Heman.   3 month ICM trend: 08/23/2017    1 Year ICM trend:       [name], RN 08/23/2017 4:05 PM

## 2017-08-23 NOTE — Discharge Instructions (Signed)
TEE ° °YOU HAD AN CARDIAC PROCEDURE TODAY: Refer to the procedure report and other information in the discharge instructions given to you for any specific questions about what was found during the examination. If this information does not answer your questions, please call [facility] office at [phone] to clarify.  ° °DIET: Your first meal following the procedure should be a light meal and then it is ok to progress to your normal diet. A half-sandwich or bowl of soup is an example of a good first meal. Heavy or fried foods are harder to digest and may make you feel nauseous or bloated. Drink plenty of fluids but you should avoid alcoholic beverages for 24 hours. If you had a esophageal dilation, please see attached instructions for diet.  ° °ACTIVITY: Your care partner should take you home directly after the procedure. You should plan to take it easy, moving slowly for the rest of the day. You can resume normal activity the day after the procedure however YOU SHOULD NOT DRIVE, use power tools, machinery or perform tasks that involve climbing or major physical exertion for 24 hours (because of the sedation medicines used during the test).  ° °SYMPTOMS TO REPORT IMMEDIATELY: °A cardiologist can be reached at any hour. Please call [phone] for any of the following symptoms:  °Vomiting of blood or coffee ground material  °New, significant abdominal pain  °New, significant chest pain or pain under the shoulder blades  °Painful or persistently difficult swallowing  °New shortness of breath  °Black, tarry-looking or red, bloody stools ° °FOLLOW UP:  °Please also call with any specific questions about appointments or follow up tests. ° °Electrical Cardioversion, Care After °This sheet gives you information about how to care for yourself after your procedure. Your health care provider may also give you more specific instructions. If you have problems or questions, contact your health care provider. °What can I  expect after the procedure? °After the procedure, it is common to have: °· Some redness on the skin where the shocks were given. ° °Follow these instructions at home: °· Do not drive for 24 hours if you were given a medicine to help you relax (sedative). °· Take over-the-counter and prescription medicines only as told by your health care provider. °· Ask your health care provider how to check your pulse. Check it often. °· Rest for 48 hours after the procedure or as told by your health care provider. °· Avoid or limit your caffeine use as told by your health care provider. °Contact a health care provider if: °· You feel like your heart is beating too quickly or your pulse is not regular. °· You have a serious muscle cramp that does not go away. °Get help right away if: °· You have discomfort in your chest. °· You are dizzy or you feel faint. °· You have trouble breathing or you are short of breath. °· Your speech is slurred. °· You have trouble moving an arm or leg on one side of your body. °· Your fingers or toes turn cold or blue. °This information is not intended to replace advice given to you by your health care provider. Make sure you discuss any questions you have with your health care provider. °Document Released: 04/12/2013 Document Revised: 01/24/2016 Document Reviewed: 12/27/2015 °Elsevier Interactive Patient Education © 2018 [employer] Inc[employer] ° °

## 2017-08-23 NOTE — Procedures (Addendum)
Electrical Cardioversion Procedure Note Anthony Rubio [id number] 26-Nov-1950  Procedure: Electrical Cardioversion Indications:  Atrial Fibrillation  Procedure Details Consent: Risks of procedure as well as the alternatives and risks of each were explained to the (patient/caregiver).  Consent for procedure obtained. Time Out: Verified patient identification, verified procedure, site/side was marked, verified correct patient position, special equipment/implants available, medications/allergies/relevent history reviewed, required imaging and test results available.  Performed  Patient placed on cardiac monitor, pulse oximetry, supplemental oxygen as necessary.  Sedation given: Propofol per anesthesiology Pacer pads placed anterior and posterior chest.  Cardioverted 1 time(s).  Cardioverted at D[facility].  Evaluation Findings: Post procedure EKG shows: NSR Complications: Hypotension immediately post-procedure, resolved.  Patient did tolerate procedure well.  St Jude ICD was checked post-procedure and functioning normally.    [name] 08/23/2017, 11:04 AM

## 2017-08-23 NOTE — Anesthesia Postprocedure Evaluation (Signed)
Anesthesia Post Note  Patient: Anthony Rubio.  Procedure(s) Performed: TRANSESOPHAGEAL ECHOCARDIOGRAM (TEE) (N/A ) CARDIOVERSION (N/A )     Patient location during evaluation: PACU Anesthesia Type: General Level of consciousness: awake Pain management: pain level controlled Vital Signs Assessment: post-procedure vital signs reviewed and stable Respiratory status: spontaneous breathing, nonlabored ventilation, respiratory function stable and patient connected to nasal cannula oxygen Cardiovascular status: blood pressure returned to baseline and stable Postop Assessment: no apparent nausea or vomiting Anesthetic complications: no    Last Vitals:  Vitals:   08/23/17 1140 08/23/17 1150  BP: 93/66 114/79  Pulse: 75 68  Resp: 20 20  Temp:    SpO2: 98% 93%    Last Pain:  Vitals:   08/23/17 1122  TempSrc: Oral                 Ryan P Ellender

## 2017-08-24 ENCOUNTER — Encounter (HOSPITAL_COMMUNITY): Payer: Self-pay | Admitting: Cardiology

## 2017-08-24 LAB — CUP PACEART REMOTE DEVICE CHECK
Brady Statistic AP VP Percent: 0.01 %
Brady Statistic AP VS Percent: 0 %
Brady Statistic AS VS Percent: 53.93 %
Brady Statistic RV Percent Paced: 47.19 %
Date Time Interrogation Session: 20190218083328
HIGH POWER IMPEDANCE MEASURED VALUE: 39 Ohm
HIGH POWER IMPEDANCE MEASURED VALUE: 46 Ohm
HighPow Impedance: 266 Ohm
Implantable Lead Implant Date: 20111108
Implantable Lead Location: 753859
Implantable Lead Model: 5076
Implantable Pulse Generator Implant Date: 20111108
Lead Channel Impedance Value: 456 Ohm
Lead Channel Pacing Threshold Amplitude: 0.625 V
Lead Channel Pacing Threshold Amplitude: 0.875 V
Lead Channel Pacing Threshold Pulse Width: 0.4 ms
Lead Channel Sensing Intrinsic Amplitude: 5.875 mV
Lead Channel Sensing Intrinsic Amplitude: 5.875 mV
Lead Channel Setting Pacing Amplitude: 2 V
Lead Channel Setting Pacing Amplitude: 2.5 V
Lead Channel Setting Pacing Pulse Width: 0.4 ms
Lead Channel Setting Sensing Sensitivity: 0.3 mV
MDC IDC LEAD IMPLANT DT: 20111108
MDC IDC LEAD LOCATION: 753860
MDC IDC MSMT BATTERY VOLTAGE: 2.62 V
MDC IDC MSMT LEADCHNL RA PACING THRESHOLD PULSEWIDTH: 0.4 ms
MDC IDC MSMT LEADCHNL RA SENSING INTR AMPL: 2.25 mV
MDC IDC MSMT LEADCHNL RA SENSING INTR AMPL: 2.25 mV
MDC IDC MSMT LEADCHNL RV IMPEDANCE VALUE: 323 Ohm
MDC IDC STAT BRADY AS VP PERCENT: 46.06 %
MDC IDC STAT BRADY RA PERCENT PACED: 0.01 %

## 2017-08-24 NOTE — Progress Notes (Signed)
He has been taking Lasix 20 mg po bid.  He needs to increase it to 40 mg bid x 5 days then down to 40 qam/20 qpm.  BMET in 1 week.

## 2017-08-25 ENCOUNTER — Telehealth: Payer: Self-pay

## 2017-08-25 ENCOUNTER — Encounter: Payer: Self-pay | Admitting: Cardiology

## 2017-08-25 DIAGNOSIS — N183 Chronic kidney disease, stage 3 unspecified: Secondary | ICD-10-CM

## 2017-08-25 NOTE — Telephone Encounter (Signed)
-----   Message from Larey Dresser, MD sent at 08/24/2017 10:23 PM EST -----   ----- Message ----- From: [name], RN Sent: 08/24/2017   2:54 PM To: Larey Dresser, MD

## 2017-08-25 NOTE — Telephone Encounter (Signed)
Spoke with pt informed him that Dr. Aundra Dubin said that it was ok for him to have the BMP drawn at the same time as his Coumadin. Pt voiced understanding

## 2017-08-25 NOTE — Telephone Encounter (Signed)
Ok to have BMET drawn with INR

## 2017-08-25 NOTE — Telephone Encounter (Signed)
Spoke with pt regarding Dr. Aundra Dubin recommendations pt stated that he was feeling a 100% better today but he would take the extra lasix as Dr. Aundra Dubin prescribed, pt also wanted to know if he could have the other lab drawn an the same time as his coumadin. Informed pt that I was not sure but I would send Dr. Aundra Dubin a message about it pt voiced understanding

## 2017-08-26 ENCOUNTER — Other Ambulatory Visit: Payer: Self-pay | Admitting: Nurse Practitioner

## 2017-08-26 ENCOUNTER — Other Ambulatory Visit: Payer: Self-pay | Admitting: Cardiology

## 2017-08-26 DIAGNOSIS — E1140 Type 2 diabetes mellitus with diabetic neuropathy, unspecified: Secondary | ICD-10-CM

## 2017-08-26 DIAGNOSIS — IMO0002 Reserved for concepts with insufficient information to code with codable children: Secondary | ICD-10-CM

## 2017-08-26 DIAGNOSIS — E1165 Type 2 diabetes mellitus with hyperglycemia: Principal | ICD-10-CM

## 2017-08-26 NOTE — Progress Notes (Signed)
Attempted call to patient to check if he needs a refill on Furosemide due to change in dosage.

## 2017-08-26 NOTE — Progress Notes (Signed)
Spoke to wife and confirmed patient has enough Furosemide since Dr Aundra Dubin made recommendations.  She said yes.  Advised if she needs any further refills to talk with Dr Aundra Dubin at the office visit on 08/31/2017.

## 2017-08-26 NOTE — Progress Notes (Signed)
Dr Claris Gladden recommendations called to patient by Theodoro Doing, RN device clinic (See separate telephone note).

## 2017-08-29 ENCOUNTER — Other Ambulatory Visit: Payer: Self-pay | Admitting: Family

## 2017-08-29 DIAGNOSIS — E1140 Type 2 diabetes mellitus with diabetic neuropathy, unspecified: Secondary | ICD-10-CM

## 2017-08-29 DIAGNOSIS — IMO0002 Reserved for concepts with insufficient information to code with codable children: Secondary | ICD-10-CM

## 2017-08-29 DIAGNOSIS — E1165 Type 2 diabetes mellitus with hyperglycemia: Principal | ICD-10-CM

## 2017-08-31 ENCOUNTER — Ambulatory Visit (INDEPENDENT_AMBULATORY_CARE_PROVIDER_SITE_OTHER): Payer: Medicare Other | Admitting: *Deleted

## 2017-08-31 ENCOUNTER — Other Ambulatory Visit: Payer: Self-pay

## 2017-08-31 ENCOUNTER — Ambulatory Visit (HOSPITAL_COMMUNITY)
Admission: RE | Admit: 2017-08-31 | Discharge: 2017-08-31 | Disposition: A | Discharge status: Home / Self Care | Payer: Medicare Other | Source: Ambulatory Visit | Attending: Cardiology | Admitting: Cardiology

## 2017-08-31 ENCOUNTER — Other Ambulatory Visit: Payer: Medicare Other

## 2017-08-31 ENCOUNTER — Encounter (HOSPITAL_COMMUNITY): Payer: Self-pay | Admitting: Cardiology

## 2017-08-31 ENCOUNTER — Telehealth: Payer: Self-pay | Admitting: Family Medicine

## 2017-08-31 VITALS — BP 118/80 | HR 63 | Wt 235.0 lb

## 2017-08-31 DIAGNOSIS — Z833 Family history of diabetes mellitus: Secondary | ICD-10-CM | POA: Diagnosis not present

## 2017-08-31 DIAGNOSIS — Z7901 Long term (current) use of anticoagulants: Secondary | ICD-10-CM | POA: Insufficient documentation

## 2017-08-31 DIAGNOSIS — I4892 Unspecified atrial flutter: Secondary | ICD-10-CM | POA: Insufficient documentation

## 2017-08-31 DIAGNOSIS — I130 Hypertensive heart and chronic kidney disease with heart failure and stage 1 through stage 4 chronic kidney disease, or unspecified chronic kidney disease: Secondary | ICD-10-CM | POA: Diagnosis not present

## 2017-08-31 DIAGNOSIS — Z79899 Other long term (current) drug therapy: Secondary | ICD-10-CM | POA: Diagnosis not present

## 2017-08-31 DIAGNOSIS — E1122 Type 2 diabetes mellitus with diabetic chronic kidney disease: Secondary | ICD-10-CM | POA: Insufficient documentation

## 2017-08-31 DIAGNOSIS — R42 Dizziness and giddiness: Secondary | ICD-10-CM | POA: Diagnosis not present

## 2017-08-31 DIAGNOSIS — N183 Chronic kidney disease, stage 3 unspecified: Secondary | ICD-10-CM

## 2017-08-31 DIAGNOSIS — I429 Cardiomyopathy, unspecified: Secondary | ICD-10-CM | POA: Diagnosis not present

## 2017-08-31 DIAGNOSIS — I5022 Chronic systolic (congestive) heart failure: Secondary | ICD-10-CM

## 2017-08-31 DIAGNOSIS — Z881 Allergy status to other antibiotic agents status: Secondary | ICD-10-CM | POA: Insufficient documentation

## 2017-08-31 DIAGNOSIS — Z800 Family history of malignant neoplasm of digestive organs: Secondary | ICD-10-CM | POA: Insufficient documentation

## 2017-08-31 DIAGNOSIS — I480 Paroxysmal atrial fibrillation: Secondary | ICD-10-CM | POA: Diagnosis not present

## 2017-08-31 DIAGNOSIS — I483 Typical atrial flutter: Secondary | ICD-10-CM

## 2017-08-31 DIAGNOSIS — Q231 Congenital insufficiency of aortic valve: Secondary | ICD-10-CM | POA: Insufficient documentation

## 2017-08-31 DIAGNOSIS — Z95810 Presence of automatic (implantable) cardiac defibrillator: Secondary | ICD-10-CM

## 2017-08-31 DIAGNOSIS — E1140 Type 2 diabetes mellitus with diabetic neuropathy, unspecified: Secondary | ICD-10-CM | POA: Insufficient documentation

## 2017-08-31 DIAGNOSIS — Z8249 Family history of ischemic heart disease and other diseases of the circulatory system: Secondary | ICD-10-CM | POA: Insufficient documentation

## 2017-08-31 DIAGNOSIS — I2510 Atherosclerotic heart disease of native coronary artery without angina pectoris: Secondary | ICD-10-CM | POA: Diagnosis not present

## 2017-08-31 DIAGNOSIS — Z5181 Encounter for therapeutic drug level monitoring: Secondary | ICD-10-CM

## 2017-08-31 DIAGNOSIS — Z823 Family history of stroke: Secondary | ICD-10-CM | POA: Diagnosis not present

## 2017-08-31 DIAGNOSIS — Z7984 Long term (current) use of oral hypoglycemic drugs: Secondary | ICD-10-CM | POA: Diagnosis not present

## 2017-08-31 DIAGNOSIS — F17210 Nicotine dependence, cigarettes, uncomplicated: Secondary | ICD-10-CM | POA: Insufficient documentation

## 2017-08-31 LAB — POCT INR: INR: 3

## 2017-08-31 LAB — BASIC METABOLIC PANEL
BUN / CREAT RATIO: 14 (ref 10–24)
BUN: 27 mg/dL (ref 8–27)
CALCIUM: 9.1 mg/dL (ref 8.6–10.2)
CO2: 24 mmol/L (ref 20–29)
Chloride: 101 mmol/L (ref 96–106)
Creatinine, Ser: 2 mg/dL — ABNORMAL HIGH (ref 0.76–1.27)
GFR, EST AFRICAN AMERICAN: 39 mL/min/{1.73_m2} — AB (ref >59)
GFR, EST NON AFRICAN AMERICAN: 34 mL/min/{1.73_m2} — AB (ref >59)
Glucose: 139 mg/dL — ABNORMAL HIGH (ref 65–99)
Potassium: 4.6 mmol/L (ref 3.5–5.2)
Sodium: 142 mmol/L (ref 134–144)

## 2017-08-31 MED ORDER — FUROSEMIDE 40 MG PO TABS: ORAL_TABLET | ORAL | 0 refills | Status: DC

## 2017-08-31 NOTE — Progress Notes (Signed)
Patient ID: Anthony Rubio., male   DOB: 08-12-1950, 67 y.o.   MRN: [id number] PCP: Dr. Jerline Pain Cardiology: Dr. Aundra Dubin  67 y.o. with history of CAD, mixed ischemic/nonischemic cardiomyopathy, and diabetes who presents for followup. He initially was hospitalized in 5/11 with acute decompensated CHF after several days of shortness of breath, orthopnea, and PND. He was diuresed and left/right heart catheterization was done. Left heart cath showed large, ectatic coronaries with slow flow and moderate to severe diffuse distal vessel disease (consistent with diabetes) without good interventional options. EF was 15-20% by echo. He was put on milrinone and further diuresed for several more days. He was titrated off milrinone and discharged on an oral medication regimen. He later developed atrial flutter requiring cardioversion. He remains in NSR.  Patient had Medtronic ICD placed in 11/11.  Repeat echo in 10/12 showed EF 25%.  Most recent echo in 1/19 showed EF 25% with severe LV dilation. He was noted to be in atrial fibrillation again in 2/19.  He had TEE-guided DCCV in 2/19 back to NSR.   He returns for followup of CHF.  He remains in NSR today.  He is not very active due to severe diabetic neuropathy, uses a wheelchair when he leaves the house, holds on to furniture when walking around the house.  He is noted to be in atrial fibrillation today, device interrogation suggests that he has been in atrial fibrillation for 7-10 days.  He is still smoking about 1 pack/week.  He has not been able tolerate even 1/2 tab tid of Bidil due to lightheadedness. Today, weight is down 5 lbs.  He has been having GERD symptoms recently after eating that are improved with antacids.  No dyspnea though not very active. No orthopnea/PND. No lightheadedness on current medication regimen.   Medtronic device reviewed: Optivol with fluid index > threshold but impedance now trending up.  He is now in NSR (since DCCV).   Labs (9/11): LDL 61,  HDL 26, K 4.8, creatinine 1.6  Labs (10/11): K 4.3, creatinine 1.3, BNP 165  Labs (11/11): K 4.9, creatinine 1.6  Labs (12/11): K 4.8, creatinine 1.7  Labs (2/12): LDL 110, HDL 28  Labs (3/12): K 4.1, creatinine 1.7 Labs (6/12): K 4.4, creatinine 1.4, BNP 125 Labs (10/12): K 4.8, creatinine 1.4 Labs (12/12): K 4.4, creatinine 1.5, BNP 20 Labs (3/13): K 5, creatinine 1.5, LDL 43, HDL 31 Labs (8/13): K 4.2, creatinine 1.3, BNP 158 Labs (05/03/14): K 4.6 Creatinine 1.4  Labs (9/16): K 4.4, creatinine 1.5 Labs (5/18): K 4.7, creatinine 1.63, pro-BNP 5249, hgb 14.4 Labs (12/18): K 4.5, creatinine 1.99, LDL 127, HDL 28, hgb 16.1 Labs (2/19): K 4.7, creatinine 2.46, LDL 78, HDL 26  ECG (personally reviewed): NSR, IVCD 136 msec with poor RWP.   Allergies:  1) ! Erythromycin   Past Medical History:  1. CAD: LHC (5/11) with 50-60% mid LAD, diffuse moderate distal LAD up to 60-70%, small OM2 subtotally occluded, moderate to severe diffuse distal PLOM disease, severe diffuse distal RCA, PDA, and PLV disease. Coronaries were ectatic with slow flow. Patient was started on Plavix given concern for thrombus formation in the ectatic coronaries. He stopped Plavix after starting coumadin with atrial flutter.  2. Chronic systolic CHF: Likely mixed ischemic/nonischemic. Echo (5/11) with EF 15-20%, severe global hypokinesis, mild MR, moderate diastolic dysfunction, mildly decreased RV systolic function. RHC (5/11) with mean RA 14 mmHg, PA 38/26, mean PCWP 25 mmHg, CI 1.8. Repeat echo (8/11) with EF  06-30%, severe diastolic dysfunction, mild MR, RV normal size and systolic function. Per official read, cannot rule out apical clot but it does appear to be only trabeculations. TEE (9/11): EF 15% with global hypokinesis, mild to moderate MR, no LV thrombus and no LAA thrombus.  Echo (10/12): EF 25%.  Echo (1/17) with EF 20-25%, severe LV dilation, mild aortic stenosis with AVA 1.5 cm^2, moderate MR, moderately dilated  RV with moderately decreased RV systolic function, PASP 50 mmHg. Medtronic ICD.  - Echo (1/19): EF 25%, severe LV dilation, mild RV dilation with severely decreased systolic function, mild-moderate MR.  - TEE (2/19): EF 25%, moderately dilated LV, mildly dilated RV with moderately decreased systolic function, moderate LAE/RAE, moderate TR, peak RV-RA gradient 56 mmHg, functionally bicuspid aortic valve without aortic stenosis.  3. Type II diabetes 4. HTN  5. Smoker  6. Severe diabetic neuropathy with chronic gait dysfunction  7. History of left foot ulcer with poor healing  8. Atrial flutter s/p DCCV 9/11  9. Aortic stenosis: Mild by echo in 1/17. 10. Atrial fibrillation: Paroxysmal.  - DCCV to NSR in 2/19.  11. CKD: Stage 3.  Suspect diabetic nephropathy.  12. Functionally bicuspid aortic valve.   Family History:  Family History of Colon CA 1st degree relative <60 9other relative)  Heart disease (grandparent)  Stroke (other relative)  dm only in secondary relatives   Social History:  Pt lives in [place]. 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    Current Outpatient Medications  Medication Sig Dispense Refill  . atorvastatin (LIPITOR) 80 MG tablet Take 1 tablet (80 mg total) by mouth daily at 6 PM. 30 tablet 3  . carvedilol (COREG) 6.25 MG tablet Take 3 tablets (18.75 mg total) by mouth 2 (two) times daily. 180 tablet 3  . Cholecalciferol (VITAMIN D-3) 5000 units TABS Take 5,000 Units by mouth daily.    . furosemide (LASIX) 40 MG tablet Take 1 tablet (40 mg total) by mouth every morning AND 0.5 tablets (20 mg total) every evening. 135 tablet 0  . glipiZIDE (GLUCOTROL XL) 10 MG 24 hr tablet Take 1 tablet (10 mg total) daily by mouth. Must keep appt w/new provider in Jan for future refills (Patient taking differently: Take 10 mg by mouth every  evening. Must keep appt w/new provider in Jan for future refills) 90 tablet 0  . glucose blood (RELION PRIME TEST) test strip Use to check blood sugars twice a day Dx 250.62 100 each 11  . isosorbide mononitrate (IMDUR) 30 MG 24 hr tablet Take 0.5 tablets (15 mg total) by mouth daily. 15 tablet 5  . RELION PRIME TEST test strip USE TO CHECK BLOOD SUGAR TWICE A DAY. 100 each 11  . spironolactone (ALDACTONE) 25 MG tablet Take 1 tablet (25 mg total) by mouth daily. 90 tablet 2  . warfarin (COUMADIN) 5 MG tablet Take 7.5 mg by mouth daily.    Marland Kitchen warfarin (COUMADIN) 5 MG tablet TAKE AS DIRECTED BY  COUMADIN  CLINIC 50 tablet 2   No current facility-administered medications for this encounter.     BP 118/80   Pulse 63   Wt 235 lb (106.6 kg)   SpO2 97%   BMI 29.37 kg/m  General: NAD Neck: No JVD, no thyromegaly or thyroid nodule.  Lungs: Clear to auscultation bilaterally with normal respiratory  effort. CV: Nondisplaced PMI.  Heart regular S1/S2, no S3/S4, no murmur.  No peripheral edema.  No carotid bruit.  Unable to palpate pedal pulses.  Abdomen: Soft, nontender, no hepatosplenomegaly, no distention.  Skin: Intact without lesions or rashes.  Neurologic: Alert and oriented x 3.  Psych: Normal affect. Extremities: No clubbing or cyanosis.  HEENT: Normal.   Assessment/Plan:  Atrial fibrillation Prior atrial flutter in 2011.  He was noted to be in atrial fibrillation in 2/19 and underwent TEE-guided DCCV to NSR.   - I do not think that warfarin is a good anticoagulant for him (rarely therapeutic). However, he is not going to be able to afford a DOAC.  Therefore, we will have to continue warfarin and encourage him to followup regularly in coumadin clinic.  - He may not stay in NSR long-term after cardioversion given cardiomyopathy and likely would be best served by Tikosyn or ablation (based on CASTLE-HF), but I am not sure that he would agree to either.  He is agreeable at this point to a  referral to EP if atrial fibrillation recurs.  CHRONIC KIDNEY DISEASE STAGE III  Suspect diabetic nephropathy, creatinine higher recently. BMET today.   Chronic systolic heart failure  Mixed ischemic/nonischemic cardiomyopathy. Has Medtronic ICD.  Last echo in 1/19 with EF 25%, severely dilated LV, severely decreased RV systolic function. He has significant distal vessel CAD on prior cath as well as a history of poorly controlled diabetes. He has NYHA class II-III symptoms though not very active due to severe peripheral neuropathy.  Suspect recent CHF exacerbation was triggered by going into atrial fibrillation.  Today, he is not volume overloaded on exam. Optivol shows fluid index still > threshold but impedance is now trending back up. - Continue 18.75 mg bid.  - He was unable to tolerate even low dose losartan and most recent creatinine was up to 2.46. Will not retry at this time.  - Unable to tolerate Bidil 1/2 tab tid due to lightheadedness.  - Continue spironolactone 25 mg daily.  - He has been on 40 mg bid Lasix.  This was increased from Lasix 20 mg bid.  I think that he can decrease Lasix back to 40 qam/20 qpm.  BMET today.   CORONARY ARTERY DISEASE Diffuse moderate to severe distal vessel disease on last cath with ectatic coronaries with slow flow. No interventional target.  No ischemic chest pain, he has had some recent chest burning after meals that is consistent with GERD and relieved with antacids.  - Continue ASA 81 daily.  - Lipids acceptable in 2/19 on atorvastatin 80 mg daily.   SMOKING I strongly encouraged him to quit smoking.  LEG WEAKNESS Likely due to diabetic neuropathy, but would like to get peripheral arterial dopplers to rule out significant PAD. He says that he has had too many procedures/tests recently and does not want to set this up yet.   Followup in 3 months.   [name] 08/31/2017

## 2017-08-31 NOTE — Patient Instructions (Signed)
Description   Today take 1 tablet, then Continue same dose of  Coumadin 1 and 1/2 tablets (7.5mg )  Daily . Call if any questions or problems [phone] Recheck INR 2 weeks.

## 2017-08-31 NOTE — Telephone Encounter (Signed)
Copied from [place]. Topic: Quick Communication - See Telephone Encounter >> Aug 31, 2017 12:36 PM Conception Chancy, NT wrote: CRM for notification. See Telephone encounter for:  08/31/17.  Patient is requesting a refill on his Glipizide sent into his pharmacy. [place] told patient to ask for a 90 day supply. Please advise.   [place], [place]

## 2017-08-31 NOTE — Patient Instructions (Signed)
Decrease Furosemide 40 mg (1 tab) in AM, then 20 mg (0.5 tab) in PM   Your physician recommends that you schedule a follow-up appointment in: 3 months with Dr. Aundra Dubin

## 2017-09-01 ENCOUNTER — Telehealth: Payer: Self-pay | Admitting: Family Medicine

## 2017-09-01 NOTE — Telephone Encounter (Signed)
See Telephone Encounter 09/01/17

## 2017-09-01 NOTE — Telephone Encounter (Signed)
Call placed to pt. to discuss his need to schedule an appt. with Caesar Chestnut, NP, to establish care, and get refill on Glipizide.  Stated that he wants to continue care with Dr. Jerline Pain, as he lives in close proximity to the [place] at [facility]. Stated he wasn't able to follow through with getting a Colonoscopy, due to Cardiology appts., and thought he was being penalized for not getting the Colonoscopy, by denying refill on Glipizide.  Advised that he is overdue for a Hgb A1C, to continue to get refills on Glipizide.  The pt. stated he didn't understand why this wasn't drawn when he was there in November, at the time of his physical.    Also, c/o having 2 episodes of vomiting large amts. recently.  Stated he noticed that there seems to be times that the food doesn't want to go through all the way.  Reported vomiting very large amt. of dark brown liquid with small amt. of black substance in it, on Friday, and again today. Reported he has noticed his stools have been smaller than usual.  Denied having any abdominal bloating or abdominal pain.  Denied fever/ chills.  Denied being exposed to anyone with GI illness. The pt. stated he just had a cardioversion on 08/23/17. Appt. Scheduled to see PCP tomorrow for nausea and vomiting, and advised to discuss with Dr. Jerline Pain re: needed labwork and refill on Glipizide at the appt.  Also advised to inform Cardiology of the vomiting, since he recently had a TEE. Verb. Understanding.

## 2017-09-01 NOTE — Telephone Encounter (Signed)
Left message that pt. Needs to reschedule appointment with Caesar Chestnut for transfer of care per Dr. Jerline Pain, before refill of glipizide can be done.

## 2017-09-02 ENCOUNTER — Other Ambulatory Visit: Payer: Self-pay

## 2017-09-02 ENCOUNTER — Telehealth: Payer: Self-pay | Admitting: *Deleted

## 2017-09-02 ENCOUNTER — Encounter (HOSPITAL_COMMUNITY): Payer: Self-pay | Admitting: Emergency Medicine

## 2017-09-02 ENCOUNTER — Emergency Department (HOSPITAL_COMMUNITY)
Admission: EM | Admit: 2017-09-02 | Discharge: 2017-09-02 | Disposition: A | Discharge status: Home / Self Care | Payer: Medicare Other | Attending: Emergency Medicine | Admitting: Emergency Medicine

## 2017-09-02 ENCOUNTER — Encounter: Payer: Self-pay | Admitting: Family Medicine

## 2017-09-02 ENCOUNTER — Ambulatory Visit (INDEPENDENT_AMBULATORY_CARE_PROVIDER_SITE_OTHER): Payer: Medicare Other | Admitting: Family Medicine

## 2017-09-02 VITALS — BP 90/60 | HR 75 | Temp 98.5°F | Resp 16 | Wt 229.2 lb

## 2017-09-02 DIAGNOSIS — I4891 Unspecified atrial fibrillation: Secondary | ICD-10-CM | POA: Diagnosis not present

## 2017-09-02 DIAGNOSIS — D649 Anemia, unspecified: Secondary | ICD-10-CM

## 2017-09-02 DIAGNOSIS — Z7984 Long term (current) use of oral hypoglycemic drugs: Secondary | ICD-10-CM | POA: Diagnosis not present

## 2017-09-02 DIAGNOSIS — K920 Hematemesis: Secondary | ICD-10-CM | POA: Diagnosis present

## 2017-09-02 DIAGNOSIS — Z79899 Other long term (current) drug therapy: Secondary | ICD-10-CM | POA: Insufficient documentation

## 2017-09-02 DIAGNOSIS — I509 Heart failure, unspecified: Secondary | ICD-10-CM | POA: Diagnosis not present

## 2017-09-02 DIAGNOSIS — F17210 Nicotine dependence, cigarettes, uncomplicated: Secondary | ICD-10-CM | POA: Diagnosis not present

## 2017-09-02 DIAGNOSIS — I959 Hypotension, unspecified: Secondary | ICD-10-CM | POA: Diagnosis not present

## 2017-09-02 DIAGNOSIS — E1122 Type 2 diabetes mellitus with diabetic chronic kidney disease: Secondary | ICD-10-CM | POA: Insufficient documentation

## 2017-09-02 DIAGNOSIS — Z7901 Long term (current) use of anticoagulants: Secondary | ICD-10-CM | POA: Diagnosis not present

## 2017-09-02 DIAGNOSIS — Z95810 Presence of automatic (implantable) cardiac defibrillator: Secondary | ICD-10-CM | POA: Diagnosis not present

## 2017-09-02 DIAGNOSIS — E1140 Type 2 diabetes mellitus with diabetic neuropathy, unspecified: Secondary | ICD-10-CM

## 2017-09-02 DIAGNOSIS — I130 Hypertensive heart and chronic kidney disease with heart failure and stage 1 through stage 4 chronic kidney disease, or unspecified chronic kidney disease: Secondary | ICD-10-CM | POA: Insufficient documentation

## 2017-09-02 DIAGNOSIS — N183 Chronic kidney disease, stage 3 (moderate): Secondary | ICD-10-CM | POA: Diagnosis not present

## 2017-09-02 DIAGNOSIS — E1165 Type 2 diabetes mellitus with hyperglycemia: Secondary | ICD-10-CM | POA: Diagnosis not present

## 2017-09-02 DIAGNOSIS — I10 Essential (primary) hypertension: Secondary | ICD-10-CM | POA: Diagnosis not present

## 2017-09-02 DIAGNOSIS — IMO0002 Reserved for concepts with insufficient information to code with codable children: Secondary | ICD-10-CM

## 2017-09-02 DIAGNOSIS — R1110 Vomiting, unspecified: Secondary | ICD-10-CM

## 2017-09-02 DIAGNOSIS — K2900 Acute gastritis without bleeding: Secondary | ICD-10-CM | POA: Diagnosis not present

## 2017-09-02 LAB — COMPREHENSIVE METABOLIC PANEL
ALBUMIN: 3.8 g/dL (ref 3.5–5.0)
ALK PHOS: 96 U/L (ref 38–126)
ALT: 32 U/L (ref 17–63)
AST: 27 U/L (ref 15–41)
Anion gap: 12 (ref 5–15)
BILIRUBIN TOTAL: 1.5 mg/dL — AB (ref 0.3–1.2)
BUN: 29 mg/dL — AB (ref 6–20)
CO2: 32 mmol/L (ref 22–32)
CREATININE: 2.19 mg/dL — AB (ref 0.61–1.24)
Calcium: 9.3 mg/dL (ref 8.9–10.3)
Chloride: 98 mmol/L — ABNORMAL LOW (ref 101–111)
GFR calc Af Amer: 34 mL/min — ABNORMAL LOW (ref >60)
GFR, EST NON AFRICAN AMERICAN: 29 mL/min — AB (ref >60)
GLUCOSE: 174 mg/dL — AB (ref 65–99)
Potassium: 4 mmol/L (ref 3.5–5.1)
Sodium: 142 mmol/L (ref 135–145)
TOTAL PROTEIN: 7.6 g/dL (ref 6.5–8.1)

## 2017-09-02 LAB — POC OCCULT BLOOD, ED: Fecal Occult Bld: NEGATIVE

## 2017-09-02 LAB — TYPE AND SCREEN
ABO/RH(D): A POS
ANTIBODY SCREEN: NEGATIVE

## 2017-09-02 LAB — CBC
HCT: 50.5 % (ref 39.0–52.0)
Hemoglobin: 16.2 g/dL (ref 13.0–17.0)
MCH: 29.2 pg (ref 26.0–34.0)
MCHC: 32.1 g/dL (ref 30.0–36.0)
MCV: 91.2 fL (ref 78.0–100.0)
PLATELETS: 221 10*3/uL (ref 150–400)
RBC: 5.54 MIL/uL (ref 4.22–5.81)
RDW: 16.1 % — ABNORMAL HIGH (ref 11.5–15.5)
WBC: 7.9 10*3/uL (ref 4.0–10.5)

## 2017-09-02 LAB — PROTIME-INR
INR: 2.09
Prothrombin Time: 23.3 seconds — ABNORMAL HIGH (ref 11.4–15.2)

## 2017-09-02 LAB — ABO/RH: ABO/RH(D): A POS

## 2017-09-02 LAB — I-STAT TROPONIN, ED: TROPONIN I, POC: 0 ng/mL (ref 0.00–0.08)

## 2017-09-02 LAB — POCT HEMOGLOBIN: HEMOGLOBIN: 13.6 g/dL — AB (ref 14.1–18.1)

## 2017-09-02 LAB — GLUCOSE, POCT (MANUAL RESULT ENTRY): POC Glucose: 138 mg/dl — AB (ref 70–99)

## 2017-09-02 MED ORDER — PANTOPRAZOLE SODIUM 20 MG PO TBEC: 20.0000 mg | DELAYED_RELEASE_TABLET | Freq: Every day | ORAL | 0 refills | Status: DC

## 2017-09-02 MED ORDER — SODIUM CHLORIDE 0.9 % IV BOLUS (SEPSIS)
500.0000 mL | Freq: Once | INTRAVENOUS | Status: AC
  Administered 2017-09-02: 500 mL via INTRAVENOUS

## 2017-09-02 MED ORDER — PANTOPRAZOLE SODIUM 40 MG IV SOLR
40.0000 mg | Freq: Once | INTRAVENOUS | Status: AC
  Administered 2017-09-02: 40 mg via INTRAVENOUS
  Filled 2017-09-02: qty 40

## 2017-09-02 NOTE — ED Notes (Signed)
ED Provider at bedside. 

## 2017-09-02 NOTE — Telephone Encounter (Signed)
Noted.  Patient is in the office now.

## 2017-09-02 NOTE — ED Triage Notes (Signed)
Pt sent from doctor's office. Pt "vomiting black." Pt on blood thinner.

## 2017-09-02 NOTE — Discharge Instructions (Addendum)
Your evaluated in the emergency department for black liquid material in your vomit.  Your workup did not show any acute findings.  We are putting you on an acid blocking medicine and you will need to call [facility] gastroenterology today or tomorrow to be seen tomorrow.  He should continue a clear liquid diet until then.  Please return if any worsening.

## 2017-09-02 NOTE — Patient Instructions (Signed)
Please go to the ED. I want to make sure you are not having any internal bleeding.

## 2017-09-02 NOTE — Telephone Encounter (Signed)
See note

## 2017-09-02 NOTE — Telephone Encounter (Signed)
Called and spoke to Elvina Sidle ED charge nurse and gave report. Patient is en route to ED via private vehicle. Patient was alert and oriented upon leaving the office.

## 2017-09-02 NOTE — ED Notes (Signed)
Pt denies nausea at this time. No vomiting since arrival.

## 2017-09-02 NOTE — ED Notes (Signed)
Sent from [place] office with confirmed GI bleed.

## 2017-09-02 NOTE — Progress Notes (Addendum)
    Subjective:  Anthony Rubio. is a 67 y.o. male who presents today for same-day appointment with a chief complaint of emesis.   HPI:  Emesis, Acute Issue Symptoms started 10 days ago after having cardioversion by his cardiologist.  Over the next day or so, had several episodes of emesis.  Symptoms essentially resolved for the next 3-4 days, however beginning last night started having more episodes of emesis.  Patient describes the emesis as a large amount of dark fluid with black material.  No fevers or chills.  No diarrhea.  No constipation.  No abdominal pain.  He is still taking warfarin.  No chest pain or dizziness.  Patient endorses generalized weakness and some dyspnea on exertion.  ROS: Per HPI  PMH: He reports that he has been smoking cigarettes.  He has a 8.25 pack-year smoking history. he has never used smokeless tobacco. He reports that he does not drink alcohol or use drugs.  Objective:  Physical Exam: BP 90/60 (BP Location: Right Arm, Patient Position: Sitting, Cuff Size: Normal)   Pulse 75   Temp 98.5 F (36.9 C) (Oral)   Resp 16   Wt 229 lb 3.2 oz (104 kg)   SpO2 96%   BMI 28.65 kg/m   Gen:67 year old man in no acute distress HEENT: Tacky appearing mucous membranes, OP clear CV: RRR with no murmurs appreciated Pulm: NWOB, CTAB with no crackles, wheezes, or rhonchi GI: Normal bowel sounds present. Soft, Nontender, Nondistended. Skin: Pale appearing, no cyanosis. Neuro: Grossly normal, moves all extremities Psych: Normal affect and thought content.   Results for orders placed or performed in visit on 09/02/17 (from the past 24 hour(s))  POCT glucose (manual entry)     Status: Abnormal   Collection Time: 09/02/17  9:18 AM  Result Value Ref Range   POC Glucose 138 (A) 70 - 99 mg/dl  POCT hemoglobin     Status: Abnormal   Collection Time: 09/02/17  9:18 AM  Result Value Ref Range   Hemoglobin 13.6 (A) 14.1 - 18.1 g/dL   Assessment/Plan:    Emesis/hypotension/anemia Patient with blood pressure 90/60 today.  Pressure was 118/80 at his cardiology office visit 2 days ago.  Hemoglobin today 13.6 compared to 16 from 2 months ago.  Patient currently on warfarin and has had a high risk for GI bleed.  At this point, unclear etiology for his emesis however given his constellation of anemia, hypotension, and being on chronic anticoagulation, we need to rule out GI bleed, especially given his description of dark/brown material.  He will also need supportive care including IV fluids. Patient has an acute issue that may pose a threat to life or body function. Recommended patient go to the emergency room for further management and evaluation.  Patient voiced understanding.  Recommended transport via EMS however patient declined.  Patient left our office with his wife who will be transporting him to [facility] emergency department.  Patient's MDM is high complex due to his number of problems and high risk of complication/morbidity/mortality.   Algis Greenhouse. Jerline Pain, MD 09/02/2017 9:58 AM

## 2017-09-02 NOTE — ED Provider Notes (Signed)
[facility] DEPT Provider Note   CSN: [id number] Arrival date & time: 09/02/17  1000     History   Chief Complaint Chief Complaint  Patient presents with  . Hematemesis    HPI Anthony Rubio. is a 67 y.o. male.  He has a history of A. fib and aortic stenosis.  2 weeks ago he underwent TEE and cardioversion.  The day after the procedure he was vomiting some black liquid but that resolved spontaneously.  It recurred again since last night.  In between he was eating and drinking well and feeling well.  States he has lost some weight since the procedure.  He denies any chest pain or shortness of breath.  He did experience some the day after the procedure.  He is on warfarin.  He states he vomited dark liquid multiple times today and last evening.  Is not associated with any abdominal pain.  He states his been constipated and does not endorse any melena.  He went to his PCP today who found him hypotensive and felt he should be transferred to the emergency department for further evaluation.  The history is provided by the patient.  Emesis   This is a new problem. The current episode started yesterday. The problem occurs 5 to 10 times per day. The problem has not changed since onset.Emesis appearance: dark liquid. There has been no fever. Pertinent negatives include no abdominal pain, no arthralgias, no chills, no cough, no diarrhea, no fever, no myalgias and no URI.    Past Medical History:  Diagnosis Date  . Aortic stenosis    a. Mild by echo 04/2011.   . Atrial flutter ([place])    s/p DCCV 9/11  . CAD (coronary artery disease)    a. LHC (5/11) with 50-60% mid LAD, diffuse mod distal LAD up to 60-70%, small OM2 subtotally occ, mod-severe diffuse distal PLOM, severe diffuse distal RCA, PDA, and PLV  disease. Started on Plavix given concern for thrombus formation in the ectatic cors with slow flow. b. Stopped Plavix after starting Coumadin for a-flutter.  . CHF  (congestive heart failure) ([place])    a. Likely mixed ICM/NICM. Echo (5/11) with EF 15-20%, severe global HK, mild MR, mod d/d, mildly decreased RV fcn. RHC (5/11) with mean RA 14 mmHg, PA 38/26, mean PCWP 25 mmHg, CI 1.8.  b. TEE (9/11): EF 15% no LV thrombus, no LAA thrombus. c. s/p ICD 11/11. d. Last echo 04/2011: LV severely dilated, EF 25%, diffuse HK, mild AS, mildly dilated LA, trivial pericardial effusion.  . CKD (chronic kidney disease), stage III ([place])   . Diabetic neuropathy (HCC)    severe; with chronic gait dysfunction  . Foot ulcer, left ([employer] [facility])    poor healing  . History of echocardiogram 07/2015   Echo 1/17: EF 20-25%, inf-lat AK, restrictive physio, mild AS (mean 9 mmHg), mild AI, mod MR, severe LAE, mod reduced RVSF, mild RAE, mild TR, PASP 50 mmHg  . HTN (hypertension)   . ICD (implantable cardiac defibrillator) in place 11/11   Medtronic dual cha,ber ICD with Optivol  . Tobacco abuse   . Uncontrolled type II diabetes mellitus [facility])     Patient Active Problem List   Diagnosis Date Noted  . Dyslipidemia associated with type 2 diabetes mellitus ([place]) 06/01/2017  . Cardiomyopathy- Mixed Ischemic and Non-Ischemic 03/07/2015  . ICD (implantable cardioverter-defibrillator), dual, in situ 03/07/2015  . Aortic stenosis 05/03/2014  . Encounter for therapeutic drug monitoring 08/10/2013  .  ATRIAL FLUTTER 03/28/2010  . Chronic systolic heart failure ([place]) 12/19/2009  . POLYNEUROPATHY 11/28/2009  . CERVICAL SPONDYLOSIS WITH MYELOPATHY 11/22/2009  . GAIT DISTURBANCE 11/22/2009  . Type 2 diabetes, uncontrolled, with neuropathy ([place]) 11/21/2009  . HLD (hyperlipidemia) 11/21/2009  . SMOKER 11/21/2009  . CAD (coronary artery disease) 11/21/2009  . PERIPHERAL VASCULAR DISEASE 11/21/2009  . Chronic kidney disease, stage III (moderate) ([place]) 11/21/2009  . Hypertension associated with diabetes ([place]) 11/14/2009    Past Surgical History:  Procedure Laterality Date  . CARDIOVERSION N/A  08/23/2017   Procedure: CARDIOVERSION;  Surgeon: Larey Dresser, MD;  Location: [facility] ENDOSCOPY;  Service: Cardiovascular;  Laterality: N/A;  . ICD implantation  2011   by JA  . TEE WITHOUT CARDIOVERSION N/A 08/23/2017   Procedure: TRANSESOPHAGEAL ECHOCARDIOGRAM (TEE);  Surgeon: Larey Dresser, MD;  Location: [facility] ENDOSCOPY;  Service: Cardiovascular;  Laterality: N/A;       Home Medications    Prior to Admission medications   Medication Sig Start Date End Date Taking? Authorizing Provider  atorvastatin (LIPITOR) 80 MG tablet Take 1 tablet (80 mg total) by mouth daily at 6 PM. 06/18/17   Larey Dresser, MD  carvedilol (COREG) 6.25 MG tablet Take 3 tablets (18.75 mg total) by mouth 2 (two) times daily. 06/18/17   Larey Dresser, MD  Cholecalciferol (VITAMIN D-3) 5000 units TABS Take 5,000 Units by mouth daily.    [provider]  furosemide (LASIX) 40 MG tablet Take 1 tablet (40 mg total) by mouth every morning AND 0.5 tablets (20 mg total) every evening. 08/31/17 11/29/17  Larey Dresser, MD  glipiZIDE (GLUCOTROL XL) 10 MG 24 hr tablet Take 1 tablet (10 mg total) daily by mouth. Must keep appt w/new provider in Jan for future refills Patient taking differently: Take 10 mg by mouth every evening. Must keep appt w/new provider in Jan for future refills 05/10/17   Lance Sell, NP  glucose blood (RELION PRIME TEST) test strip Use to check blood sugars twice a day Dx 250.62 10/23/13   Rowe Clack, MD  isosorbide mononitrate (IMDUR) 30 MG 24 hr tablet Take 0.5 tablets (15 mg total) by mouth daily. 08/17/17   Larey Dresser, MD  RELION PRIME TEST test strip USE TO CHECK BLOOD SUGAR TWICE A DAY. 10/23/13   Rowe Clack, MD  spironolactone (ALDACTONE) 25 MG tablet Take 1 tablet (25 mg total) by mouth daily. 01/28/17   Allred, Jeneen Rinks, MD  warfarin (COUMADIN) 5 MG tablet Take 7.5 mg by mouth daily.    [provider]  warfarin (COUMADIN) 5 MG tablet TAKE AS  DIRECTED BY  COUMADIN  CLINIC 08/30/17   Larey Dresser, MD    Family History Family History  Problem Relation Age of Onset  . Stroke Mother   . Diabetes Paternal Grandfather     Social History Social History   Tobacco Use  . Smoking status: Current Some Day Smoker    Packs/day: 0.25    Years: 33.00    Pack years: 8.25    Types: Cigarettes  . Smokeless tobacco: Never Used  . Tobacco comment: about 1/4 ppd. Married, lives with wife. 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 Alcohol use: No  . Drug use: No     Allergies   Erythromycin; Hydralazine; Lisinopril; and Losartan potassium   Review of Systems Review of Systems  Constitutional: Negative for chills and fever.  HENT: Negative  for ear pain and sore throat.   Eyes: Negative for pain and visual disturbance.  Respiratory: Negative for cough, chest tightness and shortness of breath.   Cardiovascular: Negative for chest pain, palpitations and leg swelling.  Gastrointestinal: Positive for constipation and vomiting. Negative for abdominal pain, diarrhea and nausea.  Genitourinary: Negative for dysuria and hematuria.  Musculoskeletal: Negative for arthralgias, back pain and myalgias.  Skin: Negative for color change and rash.  Neurological: Negative for seizures and syncope.  All other systems reviewed and are negative.    Physical Exam Updated Vital Signs BP 109/78 (BP Location: Right Arm)   Pulse 74   Temp 98 F (36.7 C) (Oral)   Resp 16   Ht 6\' 3"  (1.905 m)   Wt 103.9 kg (229 lb)   SpO2 99%   BMI 28.62 kg/m   Physical Exam  Constitutional: He appears well-developed and well-nourished.  HENT:  Head: Normocephalic and atraumatic.  Mouth/Throat: Oropharynx is clear and moist.  Eyes: Conjunctivae are normal. Pupils are equal, round, and reactive to light. Right eye exhibits no discharge. Left eye exhibits no discharge.  Neck: Normal range of motion. Neck supple.    Cardiovascular: Normal rate and regular rhythm.  No murmur heard. Pulmonary/Chest: Effort normal and breath sounds normal. No respiratory distress.  Abdominal: Soft. He exhibits no mass. There is no tenderness. There is no guarding.  Musculoskeletal: Normal range of motion. He exhibits no edema, tenderness or deformity.  Neurological: He is alert.  Skin: Skin is warm and dry.  Psychiatric: He has a normal mood and affect.  Nursing note and vitals reviewed.    ED Treatments / Results  Labs (all labs ordered are listed, but only abnormal results are displayed) Labs Reviewed  COMPREHENSIVE METABOLIC PANEL - Abnormal; Notable for the following components:      Result Value   Chloride 98 (*)    Glucose, Bld 174 (*)    BUN 29 (*)    Creatinine, Ser 2.19 (*)    Total Bilirubin 1.5 (*)    GFR calc non Af Amer 29 (*)    GFR calc Af Amer 34 (*)    All other components within normal limits  CBC - Abnormal; Notable for the following components:   RDW 16.1 (*)    All other components within normal limits  PROTIME-INR - Abnormal; Notable for the following components:   Prothrombin Time 23.3 (*)    All other components within normal limits  POC OCCULT BLOOD, ED  I-STAT TROPONIN, ED  TYPE AND SCREEN  ABO/RH    EKG  EKG Interpretation  Date/Time:  Thursday September 02 2017 11:53:49 EST Ventricular Rate:  65 PR Interval:    QRS Duration: 160 QT Interval:  538 QTC Calculation: 560 R Axis:   -77 Text Interpretation:  Atrial-paced complexes Prolonged PR interval IVCD, consider atypical RBBB Left ventricular hypertrophy Inferior infarct, old Anterior infarct, old Lateral leads are also involved similar pattern to prior 2/19 Confirmed by Aletta Edouard [phone]) on 09/02/2017 4:10:22 PM       Radiology No results found.  Procedures Procedures (including critical care time)  Medications Ordered in ED Medications  pantoprazole (PROTONIX) injection 40 mg (40 mg Intravenous Given  09/02/17 1139)  sodium chloride 0.9 % bolus 500 mL (0 mLs Intravenous Stopped 09/02/17 1140)     Initial Impression / Assessment and Plan / ED Course  I have reviewed the triage vital signs and the nursing notes.  Pertinent labs & imaging results  that were available during my care of the patient were reviewed by me and considered in my medical decision making (see chart for details).  Clinical Course as of Sep 03 1113  Thu Sep 02, 2017  1354 GI consulted [facility] gastroenterology on call.  I reviewed the findings with him and he felt that the patient should go on clear liquids, PPI, and be seen in the office tomorrow at his group at [place].  He will probably need endoscopy but it did not sound like he needed emergently.  [MB]    Clinical Course User Index [MB] Hayden Rasmussen, MD      Final Clinical Impressions(s) / ED Diagnoses   Final diagnoses:  Acute gastritis, presence of bleeding unspecified, unspecified gastritis type    ED Discharge Orders        Ordered    pantoprazole (PROTONIX) 20 MG tablet  Daily     09/02/17 1401       Hayden Rasmussen, MD 09/03/17 1115

## 2017-09-06 DIAGNOSIS — K920 Hematemesis: Secondary | ICD-10-CM | POA: Diagnosis not present

## 2017-09-06 DIAGNOSIS — R195 Other fecal abnormalities: Secondary | ICD-10-CM | POA: Diagnosis not present

## 2017-09-06 DIAGNOSIS — I4891 Unspecified atrial fibrillation: Secondary | ICD-10-CM | POA: Diagnosis not present

## 2017-09-06 DIAGNOSIS — Z7901 Long term (current) use of anticoagulants: Secondary | ICD-10-CM | POA: Diagnosis not present

## 2017-09-06 DIAGNOSIS — I509 Heart failure, unspecified: Secondary | ICD-10-CM | POA: Diagnosis not present

## 2017-09-08 ENCOUNTER — Other Ambulatory Visit: Payer: Self-pay | Admitting: Family Medicine

## 2017-09-08 DIAGNOSIS — IMO0002 Reserved for concepts with insufficient information to code with codable children: Secondary | ICD-10-CM

## 2017-09-08 DIAGNOSIS — E1165 Type 2 diabetes mellitus with hyperglycemia: Principal | ICD-10-CM

## 2017-09-08 DIAGNOSIS — E1140 Type 2 diabetes mellitus with diabetic neuropathy, unspecified: Secondary | ICD-10-CM

## 2017-09-08 NOTE — Telephone Encounter (Signed)
Request for Glipizide (Glucotrol XL) 33mt 24hr tablet.Medicaiton not previously filled by Dr. Jerline Pain  LOV: 09/02/17   Dr. Agustina Caroli [facility] [phone] [address] Cower

## 2017-09-08 NOTE — Telephone Encounter (Signed)
Copied from [place] [id number]. Topic: Quick Communication - Rx Refill/Question >> Sep 08, 2017 11:28 AM Ether Griffins B wrote: Medication: glipiZIDE (GLUCOTROL XL) 10 MG 24 hr tablet  Pt was seen in office 09/02/17 and thought Dr. Jerline Pain was refilling this at that appt.   Has the patient contacted their pharmacy? Yes.     (Agent: If no, request that the patient contact the pharmacy for the refill.)   Preferred Pharmacy (with phone number or street name): [facility] [address] [place], [place]: Please be advised that RX refills may take up to 3 business days. We ask that you follow-up with your pharmacy.

## 2017-09-08 NOTE — Telephone Encounter (Signed)
See note

## 2017-09-09 MED ORDER — GLIPIZIDE ER 10 MG PO TB24: 10.0000 mg | ORAL_TABLET | Freq: Every day | ORAL | 3 refills | Status: DC

## 2017-09-09 NOTE — Telephone Encounter (Signed)
Rx filled

## 2017-09-09 NOTE — Telephone Encounter (Signed)
Please advise.  Not previously prescribed by you.

## 2017-09-10 ENCOUNTER — Encounter (HOSPITAL_COMMUNITY): Payer: Self-pay

## 2017-09-13 ENCOUNTER — Ambulatory Visit (INDEPENDENT_AMBULATORY_CARE_PROVIDER_SITE_OTHER): Payer: Self-pay

## 2017-09-13 DIAGNOSIS — I5022 Chronic systolic (congestive) heart failure: Secondary | ICD-10-CM

## 2017-09-13 DIAGNOSIS — Z95810 Presence of automatic (implantable) cardiac defibrillator: Secondary | ICD-10-CM

## 2017-09-13 NOTE — Progress Notes (Signed)
EPIC Encounter for ICM Monitoring  Patient Name: Anthony Rubio. is a 67 y.o. male Date: 09/13/2017 Primary Care Physican: Vivi Barrack, MD Primary Cardiologist:[facility] Electrophysiologist: Allred Dry Weight: 229lbs      Spoke with wife.  Heart Failure questions reviewed, pt asymptomatic.  Patient had cardioversion 08/23/2017.  ER visit on 08/31/2017 for vomiting   Thoracic impedance slight below baseline suggesting the start of fluid accumulation.  Prescribed dosage: Furosemide to 40 mg 1 tablet every AM and 0.5 tablet (20 mg total) every PM  Labs: 09/02/2017 Creatinine 2.19, BUN 29, Potassium 4.0, Sodium 142, EGFR 29-34 08/31/2017 Creatinine 2.00, BUN 27, Potassium 4.6, Sodium 142, EGFR 34-39  08/06/2017 Creatinine 2.46, BUN 40, Potassium 4.7, Sodium 137, EGFR 26-30  06/18/2017 Creatinine1.99, BUN36, Potassium4.5, Sodium139, [id number]  05/20/2017 Creatinine1.53, BUN28, Potassium5.0, Sodium140, [id number]  12/02/2016 Creatinine1.63, BUN24, Potassium4.7, [id number], [id number] A complete set of results can be found in Results Review.  Recommendations: No changes.   Encouraged to call for fluid symptoms.  Follow-up plan: ICM clinic phone appointment on 10/14/2017.  Office appointment scheduled 12/02/2017 with Dr. Aundra Dubin.  Copy of ICM check sent to Dr. Rayann Heman.   3 month ICM trend: 09/13/2017    AT/AF   1 Year ICM trend:       [name], RN 09/13/2017 10:56 AM

## 2017-09-14 ENCOUNTER — Other Ambulatory Visit: Payer: Self-pay | Admitting: Internal Medicine

## 2017-09-29 ENCOUNTER — Ambulatory Visit (INDEPENDENT_AMBULATORY_CARE_PROVIDER_SITE_OTHER): Payer: Medicare Other | Admitting: *Deleted

## 2017-09-29 DIAGNOSIS — I483 Typical atrial flutter: Secondary | ICD-10-CM

## 2017-09-29 DIAGNOSIS — Z5181 Encounter for therapeutic drug level monitoring: Secondary | ICD-10-CM

## 2017-09-29 LAB — POCT INR: INR: 2

## 2017-09-29 NOTE — Patient Instructions (Signed)
Description   Continue same dose of  Coumadin 1 and 1/2 tablets (7.5mg )  Daily . Call if any questions or problems [phone]. Recheck INR 4 weeks.

## 2017-10-14 ENCOUNTER — Ambulatory Visit (INDEPENDENT_AMBULATORY_CARE_PROVIDER_SITE_OTHER): Payer: Medicare Other

## 2017-10-14 DIAGNOSIS — I5022 Chronic systolic (congestive) heart failure: Secondary | ICD-10-CM | POA: Diagnosis not present

## 2017-10-14 DIAGNOSIS — Z95810 Presence of automatic (implantable) cardiac defibrillator: Secondary | ICD-10-CM

## 2017-10-15 ENCOUNTER — Encounter: Payer: Self-pay | Admitting: Family Medicine

## 2017-10-15 ENCOUNTER — Other Ambulatory Visit: Payer: Self-pay | Admitting: Gastroenterology

## 2017-10-15 NOTE — Progress Notes (Signed)
EPIC Encounter for ICM Monitoring  Patient Name: Zuri Bradway. is a 67 y.o. male Date: 10/15/2017 Primary Care Physican: Vivi Barrack, MD Primary Cardiologist:[place] Electrophysiologist: Allred Dry Weight: 225lbs           Heart Failure questions reviewed, pt asymptomatic.   Thoracic impedance normal.  Prescribed dosage: Furosemide to 40 mg 1 tablet every AM and 0.5 tablet (20 mg total) every PM  Labs: 09/02/2017 Creatinine 2.19, BUN 29, Potassium 4.0, Sodium 142, EGFR 29-34 08/31/2017 Creatinine 2.00, BUN 27, Potassium 4.6, Sodium 142, EGFR 34-39  08/06/2017 Creatinine 2.46, BUN 40, Potassium 4.7, Sodium 137, EGFR 26-30  06/18/2017 Creatinine1.99, BUN36, Potassium4.5, Sodium139, [id number]  05/20/2017 Creatinine1.53, BUN28, Potassium5.0, Sodium140, [id number]  12/02/2016 Creatinine1.63, BUN24, Potassium4.7, [id number], [id number] A complete set of results can be found in Results Review.  Recommendations: No changes.  Encouraged to call for fluid symptoms.  Follow-up plan: ICM clinic phone appointment on 11/22/2017.    Copy of ICM check sent to Dr. Rayann Heman.   3 month ICM trend: 10/14/2017    AT/AF   1 Year ICM trend:       [name], RN 10/15/2017 12:41 PM

## 2017-10-28 ENCOUNTER — Ambulatory Visit (INDEPENDENT_AMBULATORY_CARE_PROVIDER_SITE_OTHER): Payer: Medicare Other | Admitting: *Deleted

## 2017-10-28 DIAGNOSIS — Z5181 Encounter for therapeutic drug level monitoring: Secondary | ICD-10-CM | POA: Diagnosis not present

## 2017-10-28 DIAGNOSIS — I483 Typical atrial flutter: Secondary | ICD-10-CM

## 2017-10-28 LAB — POCT INR: INR: 2.8

## 2017-10-28 NOTE — Patient Instructions (Signed)
Description   Continue same dose of  Coumadin 1.5 tablets (7.5mg )  Daily . Take your last dose on 11/25/17. Resume after Colonoscopy per Dr. Paulita Fujita instructions, when you restart take an extra 1/2 tablet for 2 days with your normal dose then continue with regular dose. Call if any questions or problems [phone]. Recheck INR 6 weeks.

## 2017-11-22 ENCOUNTER — Ambulatory Visit (INDEPENDENT_AMBULATORY_CARE_PROVIDER_SITE_OTHER): Payer: Medicare Other | Admitting: *Deleted

## 2017-11-22 ENCOUNTER — Ambulatory Visit (INDEPENDENT_AMBULATORY_CARE_PROVIDER_SITE_OTHER): Payer: Medicare Other

## 2017-11-22 DIAGNOSIS — I5022 Chronic systolic (congestive) heart failure: Secondary | ICD-10-CM | POA: Diagnosis not present

## 2017-11-22 DIAGNOSIS — I255 Ischemic cardiomyopathy: Secondary | ICD-10-CM

## 2017-11-22 DIAGNOSIS — Z95810 Presence of automatic (implantable) cardiac defibrillator: Secondary | ICD-10-CM

## 2017-11-22 NOTE — Progress Notes (Signed)
Remote ICD transmission.   

## 2017-11-22 NOTE — Progress Notes (Signed)
EPIC Encounter for ICM Monitoring  Patient Name: Anthony Rubio. is a 67 y.o. male Date: 11/22/2017 Primary Care Physican: Vivi Barrack, MD Primary Cardiologist:[place] Electrophysiologist: Allred Dry Weight: 235lbs  Heart Failure questions reviewed, pt asymptomatic. He is feeling fine but did take extra fluid pill yesterday, 5/20.  He is been eating country ham for a few days but soaked in water but thought it still seemed salty.  He said he did eat at [employer] in the last couple of days   Thoracic impedance abnormal suggesting fluid accumulation since 11/15/2017 but returned close to baseline.  Prescribed dosage:  Furosemide to 40 mg 1 tablet every AM and 0.5 tablet(20 mg total)every PM.  Took extra fluid pill yesterday on 5/20.  Labs: 09/02/2017 Creatinine2.19Cheree Rubio, Anthony Rubio, Anthony Rubio 08/31/2017 Creatinine2.00, BUN27, Potassium4.6, Anthony Rubio, Anthony Rubio  08/06/2017 Creatinine2.46, BUN40, Potassium4.7, Anthony Rubio, Anthony Rubio 06/18/2017 Creatinine1.99, BUN36, Potassium4.5, Anthony Rubio, Anthony Rubio  05/20/2017 Creatinine1.53, BUN28, Potassium5.0, Sodium140, [id number]  12/02/2016 Creatinine1.63, BUN24, Potassium4.7, Anthony Rubio, Anthony Rubio A complete set of results can be found in Results Review.  Recommendations:    Copy of ICM check sent to Anthony Rubio and Anthony Rubio for review.   Advised country ham and foods at [employer] have a lot of salt.   Follow-up plan: ICM clinic phone appointment on 12/23/2017  3 month ICM trend: 11/23/2017    1 Year ICM trend:       Anthony Billings, RN 11/22/2017 12:09 PM

## 2017-11-23 ENCOUNTER — Encounter: Payer: Self-pay | Admitting: Cardiology

## 2017-11-23 LAB — CUP PACEART REMOTE DEVICE CHECK
Battery Voltage: 2.62 V
Brady Statistic AP VP Percent: 0.04 %
Brady Statistic RA Percent Paced: 29.09 %
Date Time Interrogation Session: 20190520083728
HIGH POWER IMPEDANCE MEASURED VALUE: 304 Ohm
HIGH POWER IMPEDANCE MEASURED VALUE: 50 Ohm
HighPow Impedance: 42 Ohm
Implantable Lead Implant Date: 20111108
Implantable Lead Location: 753860
Implantable Lead Model: 5076
Implantable Pulse Generator Implant Date: 20111108
Lead Channel Impedance Value: 342 Ohm
Lead Channel Pacing Threshold Pulse Width: 0.4 ms
Lead Channel Sensing Intrinsic Amplitude: 3 mV
Lead Channel Sensing Intrinsic Amplitude: 8.25 mV
Lead Channel Sensing Intrinsic Amplitude: 8.25 mV
Lead Channel Setting Pacing Amplitude: 2 V
Lead Channel Setting Sensing Sensitivity: 0.3 mV
MDC IDC LEAD IMPLANT DT: 20111108
MDC IDC LEAD LOCATION: 753859
MDC IDC MSMT LEADCHNL RA IMPEDANCE VALUE: 456 Ohm
MDC IDC MSMT LEADCHNL RA PACING THRESHOLD AMPLITUDE: 0.625 V
MDC IDC MSMT LEADCHNL RA SENSING INTR AMPL: 3 mV
MDC IDC MSMT LEADCHNL RV PACING THRESHOLD AMPLITUDE: 1 V
MDC IDC MSMT LEADCHNL RV PACING THRESHOLD PULSEWIDTH: 0.4 ms
MDC IDC SET LEADCHNL RV PACING AMPLITUDE: 2.5 V
MDC IDC SET LEADCHNL RV PACING PULSEWIDTH: 0.4 ms
MDC IDC STAT BRADY AP VS PERCENT: 29.36 %
MDC IDC STAT BRADY AS VP PERCENT: 0.03 %
MDC IDC STAT BRADY AS VS PERCENT: 70.58 %
MDC IDC STAT BRADY RV PERCENT PACED: 0.07 %

## 2017-11-30 ENCOUNTER — Other Ambulatory Visit: Payer: Self-pay | Admitting: Gastroenterology

## 2017-11-30 ENCOUNTER — Other Ambulatory Visit: Payer: Self-pay

## 2017-11-30 ENCOUNTER — Encounter (HOSPITAL_COMMUNITY): Payer: Self-pay | Admitting: *Deleted

## 2017-11-30 NOTE — Progress Notes (Signed)
Anthony Rubio reports that he has Begun prep and it is going well so far.  Anthony Rubio denies chest pain or shortness of breath at rest.  Patient reports that he checks CBG a few times a week, reading 11/29/17 was 149.  I instructed patient to not take Glypiside in am. I instructed patient to check CBG after awaking and every 2 hours until arrival  to the hospital.  I Instructed patient if CBG is less than 70 to drink  1/2 cup of a clear juice- apple no red juice. Recheck CBG in 15 minutes then call Endo desk at [phone] for further instructions.

## 2017-12-01 ENCOUNTER — Encounter (HOSPITAL_COMMUNITY): Payer: Self-pay | Admitting: *Deleted

## 2017-12-01 ENCOUNTER — Ambulatory Visit (HOSPITAL_COMMUNITY)
Admission: RE | Admit: 2017-12-01 | Discharge: 2017-12-01 | Disposition: A | Discharge status: Home / Self Care | Payer: Medicare Other | Source: Ambulatory Visit | Attending: Gastroenterology | Admitting: Gastroenterology

## 2017-12-01 ENCOUNTER — Other Ambulatory Visit: Payer: Self-pay

## 2017-12-01 ENCOUNTER — Encounter (HOSPITAL_COMMUNITY)
Admission: RE | Disposition: A | Discharge status: Home / Self Care | Payer: Self-pay | Source: Ambulatory Visit | Attending: Gastroenterology

## 2017-12-01 ENCOUNTER — Ambulatory Visit (HOSPITAL_COMMUNITY): Payer: Medicare Other | Admitting: Certified Registered Nurse Anesthetist

## 2017-12-01 DIAGNOSIS — R195 Other fecal abnormalities: Secondary | ICD-10-CM | POA: Insufficient documentation

## 2017-12-01 DIAGNOSIS — I5022 Chronic systolic (congestive) heart failure: Secondary | ICD-10-CM | POA: Diagnosis not present

## 2017-12-01 DIAGNOSIS — Z79899 Other long term (current) drug therapy: Secondary | ICD-10-CM | POA: Diagnosis not present

## 2017-12-01 DIAGNOSIS — K649 Unspecified hemorrhoids: Secondary | ICD-10-CM | POA: Diagnosis not present

## 2017-12-01 DIAGNOSIS — N183 Chronic kidney disease, stage 3 (moderate): Secondary | ICD-10-CM | POA: Insufficient documentation

## 2017-12-01 DIAGNOSIS — K648 Other hemorrhoids: Secondary | ICD-10-CM | POA: Insufficient documentation

## 2017-12-01 DIAGNOSIS — E1122 Type 2 diabetes mellitus with diabetic chronic kidney disease: Secondary | ICD-10-CM | POA: Insufficient documentation

## 2017-12-01 DIAGNOSIS — I509 Heart failure, unspecified: Secondary | ICD-10-CM | POA: Insufficient documentation

## 2017-12-01 DIAGNOSIS — I700 Atherosclerosis of aorta: Secondary | ICD-10-CM | POA: Diagnosis not present

## 2017-12-01 DIAGNOSIS — I130 Hypertensive heart and chronic kidney disease with heart failure and stage 1 through stage 4 chronic kidney disease, or unspecified chronic kidney disease: Secondary | ICD-10-CM | POA: Insufficient documentation

## 2017-12-01 DIAGNOSIS — I2510 Atherosclerotic heart disease of native coronary artery without angina pectoris: Secondary | ICD-10-CM | POA: Insufficient documentation

## 2017-12-01 DIAGNOSIS — Z7901 Long term (current) use of anticoagulants: Secondary | ICD-10-CM | POA: Diagnosis not present

## 2017-12-01 DIAGNOSIS — I4892 Unspecified atrial flutter: Secondary | ICD-10-CM | POA: Insufficient documentation

## 2017-12-01 DIAGNOSIS — F17210 Nicotine dependence, cigarettes, uncomplicated: Secondary | ICD-10-CM | POA: Diagnosis not present

## 2017-12-01 HISTORY — DX: Presence of automatic (implantable) cardiac defibrillator: Z95.810

## 2017-12-01 HISTORY — DX: Polyneuropathy in diseases classified elsewhere: G63

## 2017-12-01 HISTORY — DX: Adverse effect of unspecified anesthetic, initial encounter: T41.45XA

## 2017-12-01 HISTORY — DX: Cardiac arrhythmia, unspecified: I49.9

## 2017-12-01 HISTORY — PX: COLONOSCOPY WITH PROPOFOL: SHX5780

## 2017-12-01 HISTORY — DX: Endocrine disorder, unspecified: E34.9

## 2017-12-01 HISTORY — DX: Gastro-esophageal reflux disease without esophagitis: K21.9

## 2017-12-01 HISTORY — DX: Unspecified osteoarthritis, unspecified site: M19.90

## 2017-12-01 HISTORY — DX: Other complications of anesthesia, initial encounter: T88.59XA

## 2017-12-01 LAB — GLUCOSE, CAPILLARY: Glucose-Capillary: 113 mg/dL — ABNORMAL HIGH (ref 65–99)

## 2017-12-01 SURGERY — COLONOSCOPY WITH PROPOFOL
Anesthesia: Monitor Anesthesia Care

## 2017-12-01 MED ORDER — SODIUM CHLORIDE 0.9 % IV SOLN
INTRAVENOUS | Status: DC | PRN
  Administered 2017-12-01: 09:00:00 via INTRAVENOUS

## 2017-12-01 MED ORDER — PROPOFOL 500 MG/50ML IV EMUL
INTRAVENOUS | Status: DC | PRN
  Administered 2017-12-01: 75 ug/kg/min via INTRAVENOUS

## 2017-12-01 MED ORDER — SODIUM CHLORIDE 0.9 % IV SOLN: INTRAVENOUS | Status: DC

## 2017-12-01 MED ORDER — LIDOCAINE HCL (CARDIAC) PF 100 MG/5ML IV SOSY
PREFILLED_SYRINGE | INTRAVENOUS | Status: DC | PRN
  Administered 2017-12-01: 40 mg via INTRAVENOUS

## 2017-12-01 MED ORDER — LACTATED RINGERS IV SOLN
INTRAVENOUS | Status: DC
  Administered 2017-12-01: 08:00:00 via INTRAVENOUS

## 2017-12-01 SURGICAL SUPPLY — 21 items

## 2017-12-01 NOTE — Anesthesia Postprocedure Evaluation (Signed)
Anesthesia Post Note  Patient: Anthony Rubio.  Procedure(s) Performed: COLONOSCOPY WITH PROPOFOL (N/A )     Patient location during evaluation: PACU Anesthesia Type: MAC Level of consciousness: awake and alert Pain management: pain level controlled Vital Signs Assessment: post-procedure vital signs reviewed and stable Respiratory status: spontaneous breathing, nonlabored ventilation, respiratory function stable and patient connected to nasal cannula oxygen Cardiovascular status: stable and blood pressure returned to baseline Postop Assessment: no apparent nausea or vomiting Anesthetic complications: no    Last Vitals:  Vitals:   12/01/17 0946 12/01/17 0959  BP: 106/64 113/75  Pulse: 60 (!) 59  Resp: 19 14  Temp: 36.8 C   SpO2: 96% 95%    Last Pain:  Vitals:   12/01/17 0959  TempSrc:   PainSc: 0-No pain                 Chamara Dyck COKER

## 2017-12-01 NOTE — Anesthesia Preprocedure Evaluation (Addendum)
Anesthesia Evaluation  Patient identified by MRN, date of birth, ID band Patient awake    Reviewed: Allergy & Precautions, NPO status , Patient's Chart, lab work & pertinent test results  Airway Mallampati: II  TM Distance: >3 FB Neck ROM: Full    Dental  (+) Teeth Intact, Dental Advisory Given   Pulmonary Current Smoker,    breath sounds clear to auscultation       Cardiovascular hypertension,  Rhythm:Regular Rate:Normal     Neuro/Psych    GI/Hepatic   Endo/Other  diabetes  Renal/GU      Musculoskeletal   Abdominal   Peds  Hematology   Anesthesia Other Findings   Reproductive/Obstetrics                             Anesthesia Physical Anesthesia Plan  ASA: III  Anesthesia Plan: MAC   Post-op Pain Management:    Induction: Intravenous  PONV Risk Score and Plan: Ondansetron and Dexamethasone  Airway Management Planned: Natural Airway and Simple Face Mask  Additional Equipment:   Intra-op Plan:   Post-operative Plan:   Informed Consent: I have reviewed the patients History and Physical, chart, labs and discussed the procedure including the risks, benefits and alternatives for the proposed anesthesia with the patient or authorized representative who has indicated his/her understanding and acceptance.   Dental advisory given  Plan Discussed with: CRNA and Anesthesiologist  Anesthesia Plan Comments:         Anesthesia Quick Evaluation

## 2017-12-01 NOTE — Op Note (Signed)
[facility] Patient Name: Anthony Rubio Procedure Date : 12/01/2017 MRN: [id number] Attending MD: Arta Silence , MD Date of Birth: [birth year] CSN: [id number] Age: 67 Admit Type: Outpatient Procedure:                Colonoscopy Indications:              Last colonoscopy: date unknown (patient states it                            was about 30 years ago), Positive Cologuard test Providers:                Arta Silence, MD, Baird Cancer, RN, Charolette Child, Technician, Tressia Miners, CRNA Referring MD:              Medicines:                Monitored Anesthesia Care Complications:            No immediate complications. Estimated Blood Loss:     Estimated blood loss: none. Procedure:                Pre-Anesthesia Assessment:                           - Prior to the procedure, a History and Physical                            was performed, and patient medications and                            allergies were reviewed. The patient's tolerance of                            previous anesthesia was also reviewed. The risks                            and benefits of the procedure and the sedation                            options and risks were discussed with the patient.                            All questions were answered, and informed consent                            was obtained. Prior Anticoagulants: The patient has                            taken Coumadin (warfarin), last dose was 5 days                            prior to procedure. ASA Grade Assessment: III - A  patient with severe systemic disease. After                            reviewing the risks and benefits, the patient was                            deemed in satisfactory condition to undergo the                            procedure.                           After obtaining informed consent, the colonoscope                            was passed under  direct vision. Throughout the                            procedure, the patient's blood pressure, pulse, and                            oxygen saturations were monitored continuously. The                            EC-[id number] ([id number]) scope was introduced through                            the anus and advanced to the the cecum, identified                            by appendiceal orifice and ileocecal valve. The                            ileocecal valve, appendiceal orifice, and rectum                            were photographed. The entire colon was examined.                            The colonoscopy was performed without difficulty.                            The patient tolerated the procedure well. The                            quality of the bowel preparation was good. Scope In: 9:29:12 AM Scope Out: 9:40:56 AM Scope Withdrawal Time: 0 hours 8 minutes 54 seconds  Total Procedure Duration: 0 hours 11 minutes 44 seconds  Findings:      The perianal and digital rectal examinations were normal.      Internal hemorrhoids were found during retroflexion. The hemorrhoids       were moderate.      No additional abnormalities were found on retroflexion.      Colon otherwise normal; no other polyps, masses, vascular ectasias, or  inflammatory changes were seen. Impression:               - Internal hemorrhoids. Suspect this is source of                            positive Cologuard test.                           - No specimens collected.                           - The exam was otherwise normal to the cecum. Moderate Sedation:      None Recommendation:           - Patient has a contact number available for                            emergencies. The signs and symptoms of potential                            delayed complications were discussed with the                            patient. Return to normal activities tomorrow.                            Written discharge  instructions were provided to the                            patient.                           - Discharge patient to home (via wheelchair).                           - Resume previous diet today.                           - Continue present medications.                           - Resume Coumadin (warfarin) at prior dose today.                           - Return to GI clinic PRN.                           - No further initial colon cancer screening via                            conventional optical colonoscopy , in light of                            patient's age and comorbidities. Would not repeat  cologuard ever again, as hemorrhoids will likely                            again cause false-positive test. If further colon                            cancer screening testing is desired in the future,                            would do CT colonography (virtual colonoscopy) as                            initial test.                           - Return to referring physician as previously                            scheduled. Procedure Code(s):        --- Professional ---                           [phone], Colonoscopy, flexible; diagnostic, including                            collection of specimen(s) by brushing or washing,                            when performed (separate procedure) Diagnosis Code(s):        --- Professional ---                           K64.8, Other hemorrhoids                           R19.5, Other fecal abnormalities CPT copyright 2017 American Medical Association. All rights reserved. The codes documented in this report are preliminary and upon coder review may  be revised to meet current compliance requirements. Arta Silence, MD 12/01/2017 9:50:22 AM This report has been signed electronically. Number of Addenda: 0

## 2017-12-01 NOTE — H&P (Signed)
[facility] Admission Note  Chief Complaint:  Positive cologuard  HPI: Anthony Rubio. is an 67 y.o. male.  Presenting positive cologuard.  No overt bleeding.  Past Medical History:  Diagnosis Date  . AICD (automatic cardioverter/defibrillator) present   . Aortic stenosis    a. Mild by echo 04/2011.   . Arthritis   . Atrial flutter ([place])    s/p DCCV 9/11  . CAD (coronary artery disease)    a. LHC (5/11) with 50-60% mid LAD, diffuse mod distal LAD up to 60-70%, small OM2 subtotally occ, mod-severe diffuse distal PLOM, severe diffuse distal RCA, PDA, and PLV  disease. Started on Plavix given concern for thrombus formation in the ectatic cors with slow flow. b. Stopped Plavix after starting Coumadin for a-flutter.  . CHF (congestive heart failure) ([place])    a. Likely mixed ICM/NICM. Echo (5/11) with EF 15-20%, severe global HK, mild MR, mod d/d, mildly decreased RV fcn. RHC (5/11) with mean RA 14 mmHg, PA 38/26, mean PCWP 25 mmHg, CI 1.8.  b. TEE (9/11): EF 15% no LV thrombus, no LAA thrombus. c. s/p ICD 11/11. d. Last echo 04/2011: LV severely dilated, EF 25%, diffuse HK, mild AS, mildly dilated LA, trivial pericardial effusion.  . CKD (chronic kidney disease), stage III ([place])   . Complication of anesthesia    Slow to awaken  . Diabetic neuropathy (HCC)    severe; with chronic gait dysfunction  . Dysrhythmia   . Foot ulcer, left ([place])    poor healing  . GERD (gastroesophageal reflux disease)    H.pyloric  . History of echocardiogram 07/2015   Echo 1/17: EF 20-25%, inf-lat AK, restrictive physio, mild AS (mean 9 mmHg), mild AI, mod MR, severe LAE, mod reduced RVSF, mild RAE, mild TR, PASP 50 mmHg  . HTN (hypertension)   . ICD (implantable cardiac defibrillator) in place 11/11   Medtronic dual cha,ber ICD with Optivol  . Neuropathy associated with endocrine disorder (HCC)    feet anad finger tips  . Tobacco abuse   . Uncontrolled type II diabetes mellitus ([place])     Past  Surgical History:  Procedure Laterality Date  . CARDIOVERSION N/A 08/23/2017   Procedure: CARDIOVERSION;  Surgeon: Larey Dresser, MD;  Location: [facility] ENDOSCOPY;  Service: Cardiovascular;  Laterality: N/A;  . COLONOSCOPY    . ESOPHAGOGASTRODUODENOSCOPY    . ICD implantation  2011   by JA  . TEE WITHOUT CARDIOVERSION N/A 08/23/2017   Procedure: TRANSESOPHAGEAL ECHOCARDIOGRAM (TEE);  Surgeon: Larey Dresser, MD;  Location: [facility] ENDOSCOPY;  Service: Cardiovascular;  Laterality: N/A;    Medications Prior to Admission  Medication Sig Dispense Refill  . carvedilol (COREG) 6.25 MG tablet Take 3 tablets (18.75 mg total) by mouth 2 (two) times daily. 180 tablet 3  . furosemide (LASIX) 40 MG tablet Take 1 tablet (40 mg total) by mouth every morning AND 0.5 tablets (20 mg total) every evening. 135 tablet 0  . glipiZIDE (GLUCOTROL XL) 10 MG 24 hr tablet Take 1 tablet (10 mg total) by mouth daily. 90 tablet 3  . isosorbide mononitrate (IMDUR) 30 MG 24 hr tablet Take 0.5 tablets (15 mg total) by mouth daily. 15 tablet 5  . pantoprazole (PROTONIX) 40 MG tablet Take 40 mg by mouth daily.    Marland Kitchen spironolactone (ALDACTONE) 25 MG tablet Take 1 tablet (25 mg total) by mouth daily. 90 tablet 2  . warfarin (COUMADIN) 5 MG tablet TAKE AS DIRECTED BY  COUMADIN  CLINIC (Patient taking differently: Take 5 mg by mouth daily on Thursday. Take 7.5 mg by mouth daily on all other days) 50 tablet 2  . atorvastatin (LIPITOR) 80 MG tablet Take 1 tablet (80 mg total) by mouth daily at 6 PM. 30 tablet 3  . glucose blood (RELION PRIME TEST) test strip Use to check blood sugars twice a day Dx 250.62 (Patient not taking: Reported on 11/24/2017) 100 each 11  . RELION PRIME TEST test strip USE TO CHECK BLOOD SUGAR TWICE A DAY. (Patient not taking: Reported on 11/24/2017) 100 each 11    Allergies:  Allergies  Allergen Reactions  . Erythromycin Other (See Comments)    Toes/finger tingling  . Hydralazine Other (See Comments)     Fatigue--felt terrible  . Lisinopril Other (See Comments)    Higher doses=lightheadness/dizziness/feeling faint  . Losartan Potassium Other (See Comments)    Extreme fatigue    Family History  Problem Relation Age of Onset  . Stroke Mother   . Diabetes Paternal Grandfather     Social History:  reports that he has been smoking cigarettes.  He has a 8.25 pack-year smoking history. He has never used smokeless tobacco. He reports that he does not drink alcohol or use drugs.   ROS: As per HPI, all others negative   Blood pressure 118/81, pulse 69, temperature 99 F (37.2 C), temperature source Oral, resp. rate 12, height 6\' 3"  (1.905 m), weight 108.9 kg (240 lb), SpO2 94 %. GEN:  NAD ABD:  Soft, non-tender  Results for orders placed or performed during the hospital encounter of 12/01/17 (from the past 48 hour(s))  Glucose, capillary     Status: Abnormal   Collection Time: 12/01/17  8:01 AM  Result Value Ref Range   Glucose-Capillary 113 (H) 65 - 99 mg/dL   No results found.  Assessment/Plan  1.  Chronic anticoagulation. 2.  Positive cologuard. 3.  Colonoscopy today. 4.  Risks (bleeding, infection, bowel perforation that could require surgery, sedation-related changes in cardiopulmonary systems), benefits (identification and possible treatment of source of symptoms, exclusion of certain causes of symptoms), and alternatives (watchful waiting, radiographic imaging studies, empiric medical treatment) of colonoscopy were explained to patient/family in detail and patient wishes to proceed.  Landry Dyke 12/01/2017, 9:15 AM

## 2017-12-01 NOTE — Discharge Instructions (Signed)

## 2017-12-01 NOTE — Transfer of Care (Signed)
Immediate Anesthesia Transfer of Care Note  Patient: Anthony Rubio.  Procedure(s) Performed: COLONOSCOPY WITH PROPOFOL (N/A )  Patient Location: Endoscopy Unit  Anesthesia Type:MAC  Level of Consciousness: awake, patient cooperative and responds to stimulation  Airway & Oxygen Therapy: Patient Spontanous Breathing and Patient connected to face mask oxygen  Post-op Assessment: Report given to RN and Post -op Vital signs reviewed and stable  Post vital signs: Reviewed and stable  Last Vitals:  Vitals Value Taken Time  BP 106/64 12/01/2017  9:46 AM  Temp 36.8 C 12/01/2017  9:46 AM  Pulse 59 12/01/2017  9:49 AM  Resp 13 12/01/2017  9:49 AM  SpO2 97 % 12/01/2017  9:49 AM  Vitals shown include unvalidated device data.  Last Pain:  Vitals:   12/01/17 0946  TempSrc: Oral  PainSc: 0-No pain         Complications: No apparent anesthesia complications

## 2017-12-02 ENCOUNTER — Encounter (HOSPITAL_COMMUNITY): Payer: Medicare Other | Admitting: Cardiology

## 2017-12-10 ENCOUNTER — Ambulatory Visit (INDEPENDENT_AMBULATORY_CARE_PROVIDER_SITE_OTHER): Payer: Medicare Other | Admitting: *Deleted

## 2017-12-10 DIAGNOSIS — I483 Typical atrial flutter: Secondary | ICD-10-CM

## 2017-12-10 DIAGNOSIS — Z5181 Encounter for therapeutic drug level monitoring: Secondary | ICD-10-CM

## 2017-12-10 LAB — POCT INR: INR: 1.6 — AB (ref 2.0–3.0)

## 2017-12-10 NOTE — Patient Instructions (Addendum)
Description   Today take 2 tablets (10mg ) then continue same dose of  Coumadin 1.5 tablets (7.5mg ) daily. Call if any questions or problems [phone]. Recheck INR 4 weeks per pt request and advised of risks.

## 2017-12-16 ENCOUNTER — Telehealth: Payer: Self-pay | Admitting: Internal Medicine

## 2017-12-16 NOTE — Telephone Encounter (Signed)
Remote transmission reviewed. Presenting rhythm: ApVs, AsVs  W/PVC, PAC. No episodes recorded. Stable device function. Lead measurements WNL.  ERI reached on 12/14/17.   Called patient to inform him that his device has reached ERI. I assured him that he still has 3 months of reliable device function. I told him that his device will continue to alarm at the same time every morning until his device is checked in the office. I offered patient an appt to come in and have the alarm turned off, but patient declined appt.   Will defer scheduling to [facility]. Patient states that he needs his appt to be first thing in the morning so his wife can take him and still make it to work on time.

## 2017-12-16 NOTE — Telephone Encounter (Signed)
New message    Pt is asking for a call back. He said he wanted to speak to the nurse before he scheduled an appt.

## 2017-12-16 NOTE — Telephone Encounter (Signed)
Returned call to Pt. Per Pt his device has made a tone every morning the last 3 days.  Advised Pt device probably has reached ERI.  Pt to send manual transmission.  Will schedule appointment with Dr. Rayann Heman upon receipt of manual transmission.  Sending to device for f/u.

## 2017-12-23 ENCOUNTER — Telehealth: Payer: Self-pay | Admitting: Family Medicine

## 2017-12-23 ENCOUNTER — Ambulatory Visit (INDEPENDENT_AMBULATORY_CARE_PROVIDER_SITE_OTHER): Payer: Medicare Other

## 2017-12-23 DIAGNOSIS — Z95810 Presence of automatic (implantable) cardiac defibrillator: Secondary | ICD-10-CM | POA: Diagnosis not present

## 2017-12-23 DIAGNOSIS — I5022 Chronic systolic (congestive) heart failure: Secondary | ICD-10-CM | POA: Diagnosis not present

## 2017-12-23 NOTE — Telephone Encounter (Signed)
Copied from [place] [phone]. Topic: Quick Communication - See Telephone Encounter >> Dec 23, 2017 11:29 AM Mylinda Latina, NT wrote: CRM for notification. See Telephone encounter for: 12/23/17. Patient called and states that he was prescribed glipiZIDE (GLUCOTROL XL) 10 MG 24 hr tablet and he would like a new rx for glipizide ER due to cost of the current RX.  Patient is requesting a call back if you have any questions CB# [place], [place] [phone] (Phone) [phone] (Fax)

## 2017-12-24 ENCOUNTER — Other Ambulatory Visit: Payer: Self-pay

## 2017-12-24 MED ORDER — GLIPIZIDE ER 10 MG PO TB24: 10.0000 mg | ORAL_TABLET | Freq: Every day | ORAL | 3 refills | Status: DC

## 2017-12-24 NOTE — Telephone Encounter (Signed)
Rx sent to pharmacy   

## 2017-12-24 NOTE — Progress Notes (Signed)
EPIC Encounter for ICM Monitoring  Patient Name: Anthony Rubio. is a 67 y.o. male Date: 12/24/2017 Primary Care Physican: Vivi Barrack, MD Primary Cardiologist:[place] Electrophysiologist: Allred Dry Weight: 235lbs Reached battery replacement on 12/14/2017        Heart Failure questions reviewed, pt asymptomatic now.  During decreased impedance he had swollen ankles.     Thoracic impedance normal but was abnormal suggesting fluid accumulation from 12/02/2017 - 12/22/2017.  Prescribed dosage:Furosemide to 40 mg 1 tablet every AM and 0.5 tablet(20 mg total)every PM.    Labs: 09/02/2017 Creatinine2.19, BUN29, Potassium4.0, Sodium142, CZYS06-30 08/31/2017 Creatinine2.00, BUN27, Potassium4.6, [id number], FTDD22-02  08/06/2017 Creatinine2.46, BUN40, Potassium4.7, RKYHCW237, SEGB15-17 06/18/2017 Creatinine1.99, BUN36, Potassium4.5, Sodium139, OHYW73-71  05/20/2017 Creatinine1.53, BUN28, Potassium5.0, Sodium140, GGYI94-85  12/02/2016 Creatinine1.63, BUN24, Potassium4.7, IOEVOJ500, XFGH82-99 A complete set of results can be found in Results Review  Recommendations: No changes.  Encouraged to call for fluid symptoms.  Follow-up plan: ICM clinic phone appointment on 02/07/2018.  Explained if device replaced before 8/5, then will reschedule ICM transmission for 6 weeks after the device change.  Office appointment scheduled 01/03/2018 with Dr. Rayann Heman for battery replacement and 02/01/2018 with Dr Aundra Dubin.  Copy of ICM check sent to Dr. Rayann Heman.   3 month ICM trend: 12/23/2017    1 Year ICM trend:       [name], RN 12/24/2017 11:37 AM

## 2017-12-24 NOTE — Telephone Encounter (Signed)
See note

## 2017-12-24 NOTE — Telephone Encounter (Signed)
Patient called and states that he was prescribed glipiZIDE (GLUCOTROL XL) 10 MG 24 hr tablet and he would like a new rx for glipizide ER due to cost of the current RX.   Patient is requesting a call back if you have any questions CB# [phone]  If appropriate:  [place], [place]      [phone] (Phone) [phone] (Fax)

## 2017-12-27 ENCOUNTER — Ambulatory Visit: Payer: Self-pay | Admitting: *Deleted

## 2017-12-27 ENCOUNTER — Encounter: Payer: Self-pay | Admitting: Family Medicine

## 2017-12-27 ENCOUNTER — Ambulatory Visit (INDEPENDENT_AMBULATORY_CARE_PROVIDER_SITE_OTHER): Payer: Medicare Other | Admitting: Family Medicine

## 2017-12-27 VITALS — BP 114/70 | HR 75 | Temp 98.5°F | Ht 75.0 in | Wt 225.8 lb

## 2017-12-27 DIAGNOSIS — I152 Hypertension secondary to endocrine disorders: Secondary | ICD-10-CM

## 2017-12-27 DIAGNOSIS — E1159 Type 2 diabetes mellitus with other circulatory complications: Secondary | ICD-10-CM | POA: Diagnosis not present

## 2017-12-27 DIAGNOSIS — IMO0002 Reserved for concepts with insufficient information to code with codable children: Secondary | ICD-10-CM

## 2017-12-27 DIAGNOSIS — I10 Essential (primary) hypertension: Secondary | ICD-10-CM

## 2017-12-27 DIAGNOSIS — E1140 Type 2 diabetes mellitus with diabetic neuropathy, unspecified: Secondary | ICD-10-CM | POA: Diagnosis not present

## 2017-12-27 DIAGNOSIS — E1165 Type 2 diabetes mellitus with hyperglycemia: Secondary | ICD-10-CM

## 2017-12-27 DIAGNOSIS — R109 Unspecified abdominal pain: Secondary | ICD-10-CM | POA: Diagnosis not present

## 2017-12-27 LAB — POCT GLYCOSYLATED HEMOGLOBIN (HGB A1C): HEMOGLOBIN A1C: 6.6 % — AB (ref 4.0–5.6)

## 2017-12-27 MED ORDER — CYCLOBENZAPRINE HCL 10 MG PO TABS: 10.0000 mg | ORAL_TABLET | Freq: Three times a day (TID) | ORAL | 0 refills | Status: DC | PRN

## 2017-12-27 NOTE — Telephone Encounter (Signed)
Pt called with having some pain on his right side, he thought that he pulled a muscle reaching for an object. He also stated that he had had a colonoscopy done in May and was wondering if he would be possibly having pain now from that. He had taken a pain pill that he had had prescribed previously, so he took that and that did help. He ran out and then started taking the ibuprofen and realized that he should not be taking this with his coumadin.  Pt advised against taking the coumadin with the ibuprofen because of possible bleeding. He wants to speak with his GI doctor before making an appointment to see his provider. Will route to [facility] at [place].  Reason for Disposition . General information question, no triage required and triager able to answer question  Answer Assessment - Initial Assessment Questions 1. REASON FOR CALL or QUESTION: "What is your reason for calling today?" or "How can I best help you?" or "What question do you have that I can help answer?"     He was wondering if he can take Ibuprofen with Coumadin. And also any side effects after having his colonoscopy.  Protocols used: INFORMATION ONLY CALL-A-AH

## 2017-12-27 NOTE — Telephone Encounter (Signed)
Did patient already discuss with GI? Ok with seeing him for abdominal pain, but probably needs to be triaged if it is true abdominal pain.  Anthony Rubio. Jerline Pain, MD 12/27/2017 12:48 PM

## 2017-12-27 NOTE — Assessment & Plan Note (Signed)
A1c improved to 6.6 today.  Continue glipizide 10 mg daily.  Patient will follow-up in 3 to 6 months for repeat A1c.  We will check urine microalbumin on next urine specimen.

## 2017-12-27 NOTE — Telephone Encounter (Signed)
Pt called with c/o's of pain on his right side. That started at his mid back after twisting his body to pick up an object off the floor from a lying position. It was painful then but now more soreness located on the right side going towards the front of his abd. It is painful at night and he is unable to lie down to sleep. So he sleeps propped up.  He has also took a pain pill that he had left over from a previous prescription that also helped him.  He thinks not as painful today because he used an epsom gel to but oh his side. He also stated that he has started having a burning pain at the top of his crack that started about 3 or 4 days ago. It only lasts for 20 mins. He is putting baby oil on it and that helps.  He has denied having a rash. But his wife states the left side feels a little swollen where his pain is. Appointment already scheduled. Will route to [facility] [place].  Reason for Disposition . [1] MODERATE pain (e.g., interferes with normal activities) AND [2] pain comes and goes (cramps) AND [3] present > 24 hours  (Exception: pain with Vomiting or Diarrhea - see that Guideline)  Answer Assessment - Initial Assessment Questions 1. LOCATION: "Where does it hurt?"      Right side 2. RADIATION: "Does the pain shoot anywhere else?" (e.g., chest, back)     Starts on the side and goes towards the front of the stomach but mostly on the side 3. ONSET: "When did the pain begin?" (Minutes, hours or days ago)      June 9th or 10th 4. SUDDEN: "Gradual or sudden onset?"    sudden 5. PATTERN "Does the pain come and go, or is it constant?"    - If constant: "Is it getting better, staying the same, or worsening?"      (Note: Constant means the pain never goes away completely; most serious pain is constant and it progresses)     - If intermittent: "How long does it last?" "Do you have pain now?"     (Note: Intermittent means the pain goes away completely between bouts)     Constant now but  now more soreness 6. SEVERITY: "How bad is the pain?"  (e.g., Scale 1-10; mild, moderate, or severe)    - MILD (1-3): doesn't interfere with normal activities, abdomen soft and not tender to touch     - MODERATE (4-7): interferes with normal activities or awakens from sleep, tender to touch     - SEVERE (8-10): excruciating pain, doubled over, unable to do any normal activities       Moderate now but gets worst at night esp if he lays down 7. RECURRENT SYMPTOM: "Have you ever had this type of abdominal pain before?" If so, ask: "When was the last time?" and "What happened that time?"      No never 8. CAUSE: "What do you think is causing the abdominal pain?"     Not sure what it is 9. RELIEVING/AGGRAVATING FACTORS: "What makes it better or worse?" (e.g., movement, antacids, bowel movement)     Laying down makes it worst 10. OTHER SYMPTOMS: "Has there been any vomiting, diarrhea, constipation, or urine problems?"       no  Protocols used: ABDOMINAL PAIN - MALE-A-AH

## 2017-12-27 NOTE — Telephone Encounter (Signed)
See note

## 2017-12-27 NOTE — Telephone Encounter (Signed)
Noted. Pt currently in office for his visit.  Anthony Rubio. Jerline Pain, MD 12/27/2017 3:51 PM

## 2017-12-27 NOTE — Telephone Encounter (Signed)
See other message

## 2017-12-27 NOTE — Assessment & Plan Note (Addendum)
At goal.  Continue Coreg 6.25 mg twice daily daily, and spironolactone 25 mg daily.

## 2017-12-27 NOTE — Telephone Encounter (Signed)
Patient is on the schedule for this afternoon.

## 2017-12-27 NOTE — Telephone Encounter (Signed)
FYI

## 2017-12-27 NOTE — Patient Instructions (Signed)
It was very nice to see you today!  I think you probably have a strained muscle.  Please use the Flexeril as needed.  Please bring back the urine specimen when you can.  Your blood sugar looks much better today.  We will continue all of your medications.  Please let me know if your symptoms worsen or do not improve over the next few weeks.  Please come back to see me in 3 to 6 months to follow-up on your blood sugar.  Take care, Dr Jerline Pain

## 2017-12-27 NOTE — Progress Notes (Signed)
   Subjective:  Anthony Blyth. is a 67 y.o. male who presents today with a chief complaint of flank pain.   HPI:  Flank Pain, acute problem Symptoms started about 4 weeks ago.  Patient noted he had a colonoscopy about 4 days prior to the symptoms starting and is not sure if this is related.  Patient had an episode where he was laying on the floor when he reached for something and felt a "pull" along his right side.  He thinks this is what caused the symptoms.  A few days after this started having significant pain to his right mid back radiating towards the front.  Symptoms have been relatively stable over that time, however pain has continued to move more towards his side and flank.  Patient has tried several home remedies including Epson salt soaks which helped a little bit.  He also took some leftover Augmentin which he thinks helped a little bit as well.  No rash to the area.  No fevers.  T2DM, chronic problem, stable Currently on glipizide 10mg  daily. Tolerating well without reported side effects.   HTN/CHF, Chronic problem, stable Currently on coreg 6.25mg  twice daily, imdur 15mg  daily, and spironolactone 25mg .   ROS: Per HPI  PMH: He reports that he has been smoking cigarettes.  He has a 8.25 pack-year smoking history. He has never used smokeless tobacco. He reports that he does not drink alcohol or use drugs.  Objective:  Physical Exam: BP 114/70 (BP Location: Left Arm, Patient Position: Sitting, Cuff Size: Normal)   Pulse 75   Temp 98.5 F (36.9 C) (Oral)   Ht 6\' 3"  (1.905 m)   Wt 225 lb 12.8 oz (102.4 kg)   SpO2 98%   BMI 28.22 kg/m   Wt Readings from Last 3 Encounters:  12/27/17 225 lb 12.8 oz (102.4 kg)  12/01/17 240 lb (108.9 kg)  09/02/17 229 lb (103.9 kg)  Gen: NAD, resting comfortably CV: RRR with no murmurs appreciated Pulm: NWOB, CTAB with no crackles, wheezes, or rhonchi GI: Normal bowel sounds present. Soft, Nontender, Nondistended. MSK:  -Back: No  deformities.  Nontender to palpation.  No CVA tenderness.  Assessment/Plan:  Right flank pain Likely secondary to muscular strain.  Cannot rule out kidney stone.  Attempted to obtain urine today, however patient was able to provide specimen.  He will come back to provide this.  In the interim, we will start a course of Flexeril.  We will avoid NSAIDs and prednisone given history of anticoagulation, type 2 diabetes, and ischemic heart disease.  Future order placed for UA and urine culture.  Type 2 diabetes, uncontrolled, with neuropathy (HCC) A1c improved to 6.6 today.  Continue glipizide 10 mg daily.  Patient will follow-up in 3 to 6 months for repeat A1c.  We will check urine microalbumin on next urine specimen.  Hypertension associated with diabetes ([employer]) At goal.  Continue Coreg 6.25 mg twice daily daily, and spironolactone 25 mg daily.  Preventive health care Patient with recent colonoscopy which was normal.   Algis Greenhouse. Jerline Pain, MD 12/27/2017 4:37 PM

## 2018-01-03 ENCOUNTER — Ambulatory Visit (INDEPENDENT_AMBULATORY_CARE_PROVIDER_SITE_OTHER): Payer: Medicare Other | Admitting: *Deleted

## 2018-01-03 ENCOUNTER — Encounter: Payer: Self-pay | Admitting: Internal Medicine

## 2018-01-03 ENCOUNTER — Ambulatory Visit (INDEPENDENT_AMBULATORY_CARE_PROVIDER_SITE_OTHER): Payer: Medicare Other | Admitting: Internal Medicine

## 2018-01-03 ENCOUNTER — Telehealth: Payer: Self-pay | Admitting: *Deleted

## 2018-01-03 VITALS — BP 110/60 | HR 74 | Ht 75.0 in | Wt 228.0 lb

## 2018-01-03 DIAGNOSIS — I4819 Other persistent atrial fibrillation: Secondary | ICD-10-CM

## 2018-01-03 DIAGNOSIS — I483 Typical atrial flutter: Secondary | ICD-10-CM | POA: Diagnosis not present

## 2018-01-03 DIAGNOSIS — I481 Persistent atrial fibrillation: Secondary | ICD-10-CM | POA: Diagnosis not present

## 2018-01-03 DIAGNOSIS — I429 Cardiomyopathy, unspecified: Secondary | ICD-10-CM | POA: Diagnosis not present

## 2018-01-03 DIAGNOSIS — Z5181 Encounter for therapeutic drug level monitoring: Secondary | ICD-10-CM | POA: Diagnosis not present

## 2018-01-03 DIAGNOSIS — I255 Ischemic cardiomyopathy: Secondary | ICD-10-CM | POA: Diagnosis not present

## 2018-01-03 DIAGNOSIS — I5022 Chronic systolic (congestive) heart failure: Secondary | ICD-10-CM | POA: Diagnosis not present

## 2018-01-03 DIAGNOSIS — Z01812 Encounter for preprocedural laboratory examination: Secondary | ICD-10-CM

## 2018-01-03 LAB — BASIC METABOLIC PANEL
BUN / CREAT RATIO: 14 (ref 10–24)
BUN: 23 mg/dL (ref 8–27)
CO2: 23 mmol/L (ref 20–29)
CREATININE: 1.62 mg/dL — AB (ref 0.76–1.27)
Calcium: 9.4 mg/dL (ref 8.6–10.2)
Chloride: 106 mmol/L (ref 96–106)
GFR calc non Af Amer: 43 mL/min/{1.73_m2} — ABNORMAL LOW (ref >59)
GFR, EST AFRICAN AMERICAN: 50 mL/min/{1.73_m2} — AB (ref >59)
GLUCOSE: 103 mg/dL — AB (ref 65–99)
Potassium: 4.5 mmol/L (ref 3.5–5.2)
Sodium: 143 mmol/L (ref 134–144)

## 2018-01-03 LAB — CUP PACEART INCLINIC DEVICE CHECK
Battery Voltage: 2.61 V
Brady Statistic AP VP Percent: 0.11 %
Brady Statistic AS VP Percent: 0.03 %
Brady Statistic RA Percent Paced: 33.54 %
Date Time Interrogation Session: 20190701094945
HIGH POWER IMPEDANCE MEASURED VALUE: 304 Ohm
HighPow Impedance: 45 Ohm
HighPow Impedance: 53 Ohm
Implantable Lead Implant Date: 20111108
Implantable Lead Location: 753860
Implantable Lead Model: 5076
Implantable Pulse Generator Implant Date: 20111108
Lead Channel Impedance Value: 342 Ohm
Lead Channel Pacing Threshold Amplitude: 0.75 V
Lead Channel Pacing Threshold Amplitude: 1 V
Lead Channel Pacing Threshold Pulse Width: 0.4 ms
Lead Channel Sensing Intrinsic Amplitude: 2.625 mV
Lead Channel Sensing Intrinsic Amplitude: 3.125 mV
Lead Channel Sensing Intrinsic Amplitude: 6.375 mV
Lead Channel Setting Sensing Sensitivity: 0.3 mV
MDC IDC LEAD IMPLANT DT: 20111108
MDC IDC LEAD LOCATION: 753859
MDC IDC MSMT LEADCHNL RA IMPEDANCE VALUE: 456 Ohm
MDC IDC MSMT LEADCHNL RV PACING THRESHOLD PULSEWIDTH: 0.4 ms
MDC IDC MSMT LEADCHNL RV SENSING INTR AMPL: 8.375 mV
MDC IDC SET LEADCHNL RA PACING AMPLITUDE: 2 V
MDC IDC SET LEADCHNL RV PACING AMPLITUDE: 2.5 V
MDC IDC SET LEADCHNL RV PACING PULSEWIDTH: 0.4 ms
MDC IDC STAT BRADY AP VS PERCENT: 33.75 %
MDC IDC STAT BRADY AS VS PERCENT: 66.1 %
MDC IDC STAT BRADY RV PERCENT PACED: 0.17 %

## 2018-01-03 LAB — CBC
HEMATOCRIT: 46.1 % (ref 37.5–51.0)
HEMOGLOBIN: 15.5 g/dL (ref 13.0–17.7)
MCH: 28.7 pg (ref 26.6–33.0)
MCHC: 33.6 g/dL (ref 31.5–35.7)
MCV: 85 fL (ref 79–97)
Platelets: 170 10*3/uL (ref 150–450)
RBC: 5.4 x10E6/uL (ref 4.14–5.80)
RDW: 16 % — AB (ref 12.3–15.4)
WBC: 6.8 10*3/uL (ref 3.4–10.8)

## 2018-01-03 LAB — POCT INR: INR: 4.2 — AB (ref 2.0–3.0)

## 2018-01-03 NOTE — Progress Notes (Signed)
PCP: Vivi Barrack, MD Primary Cardiologist: Dr Aundra Dubin Primary EP: Dr Rayann Heman  Anthony Rubio. is a 67 y.o. male who presents today for routine electrophysiology followup.  Since last being seen in our clinic, the patient reports doing very well.  Today, he denies symptoms of palpitations, chest pain, shortness of breath,  lower extremity edema, dizziness, presyncope, syncope, or ICD shocks.  He is not active due to severe diabetic neuropathy.  Uses a wheelchair to ambulate.  The patient is otherwise without complaint today.   Past Medical History:  Diagnosis Date  . AICD (automatic cardioverter/defibrillator) present   . Aortic stenosis    a. Mild by echo 04/2011.   . Arthritis   . Atrial flutter ([place])    s/p DCCV 9/11  . CAD (coronary artery disease)    a. LHC (5/11) with 50-60% mid LAD, diffuse mod distal LAD up to 60-70%, small OM2 subtotally occ, mod-severe diffuse distal PLOM, severe diffuse distal RCA, PDA, and PLV  disease. Started on Plavix given concern for thrombus formation in the ectatic cors with slow flow. b. Stopped Plavix after starting Coumadin for a-flutter.  . CHF (congestive heart failure) ([place])    a. Likely mixed ICM/NICM. Echo (5/11) with EF 15-20%, severe global HK, mild MR, mod d/d, mildly decreased RV fcn. RHC (5/11) with mean RA 14 mmHg, PA 38/26, mean PCWP 25 mmHg, CI 1.8.  b. TEE (9/11): EF 15% no LV thrombus, no LAA thrombus. c. s/p ICD 11/11. d. Last echo 04/2011: LV severely dilated, EF 25%, diffuse HK, mild AS, mildly dilated LA, trivial pericardial effusion.  . CKD (chronic kidney disease), stage III ([place])   . Complication of anesthesia    Slow to awaken  . Diabetic neuropathy (HCC)    severe; with chronic gait dysfunction  . Dysrhythmia   . Foot ulcer, left ([place])    poor healing  . GERD (gastroesophageal reflux disease)    H.pyloric  . History of echocardiogram 07/2015   Echo 1/17: EF 20-25%, inf-lat AK, restrictive physio, mild AS (mean 9 mmHg),  mild AI, mod MR, severe LAE, mod reduced RVSF, mild RAE, mild TR, PASP 50 mmHg  . HTN (hypertension)   . ICD (implantable cardiac defibrillator) in place 11/11   Medtronic dual cha,ber ICD with Optivol  . Neuropathy associated with endocrine disorder (HCC)    feet anad finger tips  . Tobacco abuse   . Uncontrolled type II diabetes mellitus ([place])    Past Surgical History:  Procedure Laterality Date  . CARDIOVERSION N/A 08/23/2017   Procedure: CARDIOVERSION;  Surgeon: Larey Dresser, MD;  Location: [facility] ENDOSCOPY;  Service: Cardiovascular;  Laterality: N/A;  . COLONOSCOPY    . COLONOSCOPY WITH PROPOFOL N/A 12/01/2017   Procedure: COLONOSCOPY WITH PROPOFOL;  Surgeon: Arta Silence, MD;  Location: [place];  Service: Endoscopy;  Laterality: N/A;  . ESOPHAGOGASTRODUODENOSCOPY    . ICD implantation  2011   by JA  . TEE WITHOUT CARDIOVERSION N/A 08/23/2017   Procedure: TRANSESOPHAGEAL ECHOCARDIOGRAM (TEE);  Surgeon: Larey Dresser, MD;  Location[facility] ENDOSCOPY;  Service: Cardiovascular;  Laterality: N/A;    ROS- all systems are reviewed and negative except as per HPI above  Current Outpatient Medications  Medication Sig Dispense Refill  . atorvastatin (LIPITOR) 80 MG tablet Take 1 tablet (80 mg total) by mouth daily at 6 PM. 30 tablet 3  . carvedilol (COREG) 6.25 MG tablet Take 3 tablets (18.75 mg total) by mouth 2 (two) times  daily. 180 tablet 3  . cyclobenzaprine (FLEXERIL) 10 MG tablet Take 1 tablet (10 mg total) by mouth 3 (three) times daily as needed for muscle spasms. 30 tablet 0  . furosemide (LASIX) 40 MG tablet Take 1 tablet (40 mg total) by mouth every morning AND 0.5 tablets (20 mg total) every evening. 135 tablet 0  . glipiZIDE (GLUCOTROL XL) 10 MG 24 hr tablet Take 1 tablet (10 mg total) by mouth daily with breakfast. 90 tablet 3  . isosorbide mononitrate (IMDUR) 30 MG 24 hr tablet Take 0.5 tablets (15 mg total) by mouth daily. 15 tablet 5  . pantoprazole (PROTONIX) 40 MG  tablet Take 40 mg by mouth daily.    Marland Kitchen spironolactone (ALDACTONE) 25 MG tablet Take 1 tablet (25 mg total) by mouth daily. 90 tablet 2  . warfarin (COUMADIN) 5 MG tablet TAKE AS DIRECTED BY  COUMADIN  CLINIC 50 tablet 2   No current facility-administered medications for this visit.     Physical Exam: Vitals:   01/03/18 0806  BP: 110/60  Pulse: 74  Weight: 228 lb (103.4 kg)  Height: 6\' 3"  (1.905 m)    GEN- The patient is well appearing, alert and oriented x 3 today.   Head- normocephalic, atraumatic Eyes-  Sclera clear, conjunctiva pink Ears- hearing intact Oropharynx- clear Lungs- Clear to ausculation bilaterally, normal work of breathing Chest- ICD pocket is well healed Heart- Regular rate and rhythm, no murmurs, rubs or gallops, PMI not laterally displaced GI- soft, NT, ND, + BS Extremities- no clubbing, cyanosis, or edema  ICD interrogation- reviewed in detail today,  See PACEART report  ekg tracing ordered today is personally reviewed and shows sinus rhythm 74 bpm, PR 214 msec, LBBB (QRS 138 msec)  Wt Readings from Last 3 Encounters:  01/03/18 228 lb (103.4 kg)  12/27/17 225 lb 12.8 oz (102.4 kg)  12/01/17 240 lb (108.9 kg)    Assessment and Plan:  1.  Chronic systolic dysfunction with mixed ischemic/ nonischemic CM euvolemic today He has severe biventricular failure Stable on an appropriate medical regimen.  Has not tolerated even low dose losartan previously and has had issues with renal failure. QRS today is 138 msec and review of past 3 years of ekgs reveal that QRS is typically 130-140 msec.  I am not convinced that upgrade to CRT would be beneficial at this time. Normal ICD function, though device has reached RRT. Risks, benefits, and alternatives to ICD pulse generator replacement were discussed in detail today.  The patient understands that risks include but are not limited to bleeding, infection, pneumothorax, perforation, tamponade, vascular damage, renal  failure, MI, stroke, death, inappropriate shocks, damage to his existing leads, and lead dislodgement and wishes to proceed.  We will therefore schedule the procedure at the next available time. Hold coumadin 2 days prior to the procedure. See Pace Art report No changes today  2. HTN Stable No change required today  3. CAD No ischemic symptoms No changes  4. Atrial flutter (2011)/ atrial fibrillation requiring cardioversion (2/19) Well controlled On coumadin due to costs of DOAC therapy  Thompson Grayer MD, [facility] 01/03/2018 9:19 AM

## 2018-01-03 NOTE — Patient Instructions (Signed)
Description   Skip today's dose,  then continue same dose of  Coumadin 1.5 tablets (7.5mg ) daily. Call if any questions or problems [phone]. Recheck INR 2 weeks.

## 2018-01-03 NOTE — Patient Instructions (Addendum)
Medication Instructions:  Your physician recommends that you continue on your current medications as directed. Please refer to the Current Medication list given to you today.  Labwork: Today: BMET & CBC     *We will only notify you of abnormal results, otherwise continue current treatment plan.  Testing/Procedures: Your physician has recommended that you have a defibrillator generator (battery) change. Please review instructions below under special instructions.  Follow-Up: Remote monitoring is used to monitor your Pacemaker of ICD from home. This monitoring reduces the number of office visits required to check your device to one time per year. It allows [place] to keep an eye on the functioning of your device to ensure it is working properly. You are scheduled for a device check from home on 02/21/2018. You may send your transmission at any time that day. If you have a wireless device, the transmission will be sent automatically. After your physician reviews your transmission, you will receive a postcard with your next transmission date.  Your physician recommends that you schedule a wound check appointment 10-14 days, after your procedure on 02/01/2018, with the device clinic.  Your physician recommends that you schedule a follow up appointment in 91 days, after your procedure on 02/01/2018, with Dr. Rayann Heman.   * If you need a refill on your cardiac medications before your next appointment, please call your pharmacy.   *Please note that any paperwork needing to be filled out by the provider will need to be addressed at the front desk prior to seeing the provider. Please note that any FMLA, disability or other documents regarding health condition is subject to a $25.00 charge that must be received prior to completion of paperwork in the form of a money order or check.  Thank you for choosing [facility]!!    Any Other Special Instructions Will Be Listed Below (If Applicable).   Implantable  Device Instructions  You are scheduled for:                  _____ Defibrillator Generator change (battery change)   on  02/01/2018  with Dr. Rayann Heman.  1.   Please arrive at the [facility], Entrance "A"  at [facility] at 1:00 p.m. on the day of your procedure. (The address is [address])  2. Do not eat or drink after midnight the night before your procedure.  3.   Complete pre procedure  lab work on 01/03/18.  The lab at [facility] is open from 8:00 AM to 4:30 PM.  You do not have to be fasting.  4.   A) - Hold Coumadin 2 days prior to the procedure.  You will take your last dose on   01/31/2018.           - Hold the following medications the morning of your procedure:    1. Glipizide   2. Lasix           - All of your remaining medications may be taken with a small amount of water the   morning of your procedure.  5.  Plan for an overnight stay.  Bring your insurance cards and a list of you medications.  6.  Wash your chest and neck with surgical scrub the evening before and the morning of  your procedure.  Rinse well. Please review the surgical scrub instruction sheet given to you.  7. Your chest will need to be shaved prior to this procedure (if needed). We  ask that you do this yourself at home 1 to 2 days before or if uncomfortable/unable to do yourself, then it will be performed by the hospital staff the day of.                                                                                                                * If you have ANY questions after you get home, please call Myrtie Hawk, RN @ [phone].  * Every attempt is made to prevent procedures from being rescheduled.  Due to the nature of  Electrophysiology, rescheduling can happen.  The physician is always aware and directs the staff when this occurs.     Supplemental Discharge Instructions for  Pacemaker/Defibrillator Patients  ACTIVITY No heavy lifting or vigorous activity  with your left/right arm for 6 to 8 weeks.  Do not raise your left/right arm above your head for one week.  Gradually raise your affected arm as drawn below.           __  NO DRIVING for     ; you may begin driving on     .  WOUND CARE - Keep the wound area clean and dry.  Do not get this area wet for one week. No showers for one week; you may shower on     . - The tape/steri-strips on your wound will fall off; do not pull them off.  No bandage is needed on the site.  DO  NOT apply any creams, oils, or ointments to the wound area. - If you notice any drainage or discharge from the wound, any swelling or bruising at the site, or you develop a fever > 101? F after you are discharged home, call the office at once.  SPECIAL INSTRUCTIONS - You are still able to use cellular telephones; use the ear opposite the side where you have your pacemaker/defibrillator.  Avoid carrying your cellular phone near your device. - When traveling through airports, show security personnel your identification card to avoid being screened in the metal detectors.  Ask the security personnel to use the hand wand. - Avoid arc welding equipment, MRI testing (magnetic resonance imaging), TENS units (transcutaneous nerve stimulators).  Call the office for questions about other devices. - Avoid electrical appliances that are in poor condition or are not properly grounded. - Microwave ovens are safe to be near or to operate.  ADDITIONAL INFORMATION FOR DEFIBRILLATOR PATIENTS SHOULD YOUR DEVICE GO OFF: - If your device goes off ONCE and you feel fine afterward, notify the device clinic nurses. - If your device goes off ONCE and you do not feel well afterward, call 911. - If your device goes off TWICE, call 911. - If your device goes off [place], call 911.  DO NOT DRIVE YOURSELF OR A FAMILY MEMBER WITH A DEFIBRILLATOR TO THE HOSPITAL-CALL 911.

## 2018-01-03 NOTE — Telephone Encounter (Signed)
Informed patient of new arrival time for his procedure on 7/30.  He is aware to arrive at 11:30 am that morning the day of his procedure.  Patient verbalized understanding and agreeable to plan.

## 2018-01-03 NOTE — H&P (View-Only) (Signed)
PCP: Vivi Barrack, MD Primary Cardiologist: Dr Aundra Dubin Primary EP: Dr Rayann Heman  [name] is a 67 y.o. male who presents today for routine electrophysiology followup.  Since last being seen in our clinic, the patient reports doing very well.  Today, he denies symptoms of palpitations, chest pain, shortness of breath,  lower extremity edema, dizziness, presyncope, syncope, or ICD shocks.  He is not active due to severe diabetic neuropathy.  Uses a wheelchair to ambulate.  The patient is otherwise without complaint today.   Past Medical History:  Diagnosis Date  . AICD (automatic cardioverter/defibrillator) present   . Aortic stenosis    a. Mild by echo 04/2011.   . Arthritis   . Atrial flutter ([place])    s/p DCCV 9/11  . CAD (coronary artery disease)    a. LHC (5/11) with 50-60% mid LAD, diffuse mod distal LAD up to 60-70%, small OM2 subtotally occ, mod-severe diffuse distal PLOM, severe diffuse distal RCA, PDA, and PLV  disease. Started on Plavix given concern for thrombus formation in the ectatic cors with slow flow. b. Stopped Plavix after starting Coumadin for a-flutter.  . CHF (congestive heart failure) ([place])    a. Likely mixed ICM/NICM. Echo (5/11) with EF 15-20%, severe global HK, mild MR, mod d/d, mildly decreased RV fcn. RHC (5/11) with mean RA 14 mmHg, PA 38/26, mean PCWP 25 mmHg, CI 1.8.  b. TEE (9/11): EF 15% no LV thrombus, no LAA thrombus. c. s/p ICD 11/11. d. Last echo 04/2011: LV severely dilated, EF 25%, diffuse HK, mild AS, mildly dilated LA, trivial pericardial effusion.  . CKD (chronic kidney disease), stage III ([place])   . Complication of anesthesia    Slow to awaken  . Diabetic neuropathy (HCC)    severe; with chronic gait dysfunction  . Dysrhythmia   . Foot ulcer, left ([place])    poor healing  . GERD (gastroesophageal reflux disease)    H.pyloric  . History of echocardiogram 07/2015   Echo 1/17: EF 20-25%, inf-lat AK, restrictive physio, mild AS (mean 9 mmHg),  mild AI, mod MR, severe LAE, mod reduced RVSF, mild RAE, mild TR, PASP 50 mmHg  . HTN (hypertension)   . ICD (implantable cardiac defibrillator) in place 11/11   Medtronic dual cha,ber ICD with Optivol  . Neuropathy associated with endocrine disorder (HCC)    feet anad finger tips  . Tobacco abuse   . Uncontrolled type II diabetes mellitus ([place])    Past Surgical History:  Procedure Laterality Date  . CARDIOVERSION N/A 08/23/2017   Procedure: CARDIOVERSION;  Surgeon: Larey Dresser, MD;  Location: [facility] ENDOSCOPY;  Service: Cardiovascular;  Laterality: N/A;  . COLONOSCOPY    . COLONOSCOPY WITH PROPOFOL N/A 12/01/2017   Procedure: COLONOSCOPY WITH PROPOFOL;  Surgeon: Arta Silence, MD;  Location: [place];  Service: Endoscopy;  Laterality: N/A;  . ESOPHAGOGASTRODUODENOSCOPY    . ICD implantation  2011   by JA  . TEE WITHOUT CARDIOVERSION N/A 08/23/2017   Procedure: TRANSESOPHAGEAL ECHOCARDIOGRAM (TEE);  Surgeon: Larey Dresser, MD;  Location: [facility] ENDOSCOPY;  Service: Cardiovascular;  Laterality: N/A;    ROS- all systems are reviewed and negative except as per HPI above  Current Outpatient Medications  Medication Sig Dispense Refill  . atorvastatin (LIPITOR) 80 MG tablet Take 1 tablet (80 mg total) by mouth daily at 6 PM. 30 tablet 3  . carvedilol (COREG) 6.25 MG tablet Take 3 tablets (18.75 mg total) by mouth 2 (two) times  daily. 180 tablet 3  . cyclobenzaprine (FLEXERIL) 10 MG tablet Take 1 tablet (10 mg total) by mouth 3 (three) times daily as needed for muscle spasms. 30 tablet 0  . furosemide (LASIX) 40 MG tablet Take 1 tablet (40 mg total) by mouth every morning AND 0.5 tablets (20 mg total) every evening. 135 tablet 0  . glipiZIDE (GLUCOTROL XL) 10 MG 24 hr tablet Take 1 tablet (10 mg total) by mouth daily with breakfast. 90 tablet 3  . isosorbide mononitrate (IMDUR) 30 MG 24 hr tablet Take 0.5 tablets (15 mg total) by mouth daily. 15 tablet 5  . pantoprazole (PROTONIX) 40 MG  tablet Take 40 mg by mouth daily.    Marland Kitchen spironolactone (ALDACTONE) 25 MG tablet Take 1 tablet (25 mg total) by mouth daily. 90 tablet 2  . warfarin (COUMADIN) 5 MG tablet TAKE AS DIRECTED BY  COUMADIN  CLINIC 50 tablet 2   No current facility-administered medications for this visit.     Physical Exam: Vitals:   01/03/18 0806  BP: 110/60  Pulse: 74  Weight: 228 lb (103.4 kg)  Height: 6\' 3"  (1.905 m)    GEN- The patient is well appearing, alert and oriented x 3 today.   Head- normocephalic, atraumatic Eyes-  Sclera clear, conjunctiva pink Ears- hearing intact Oropharynx- clear Lungs- Clear to ausculation bilaterally, normal work of breathing Chest- ICD pocket is well healed Heart- Regular rate and rhythm, no murmurs, rubs or gallops, PMI not laterally displaced GI- soft, NT, ND, + BS Extremities- no clubbing, cyanosis, or edema  ICD interrogation- reviewed in detail today,  See PACEART report  ekg tracing ordered today is personally reviewed and shows sinus rhythm 74 bpm, PR 214 msec, LBBB (QRS 138 msec)  Wt Readings from Last 3 Encounters:  01/03/18 228 lb (103.4 kg)  12/27/17 225 lb 12.8 oz (102.4 kg)  12/01/17 240 lb (108.9 kg)    Assessment and Plan:  1.  Chronic systolic dysfunction with mixed ischemic/ nonischemic CM euvolemic today He has severe biventricular failure Stable on an appropriate medical regimen.  Has not tolerated even low dose losartan previously and has had issues with renal failure. QRS today is 138 msec and review of past 3 years of ekgs reveal that QRS is typically 130-140 msec.  I am not convinced that upgrade to CRT would be beneficial at this time. Normal ICD function, though device has reached RRT. Risks, benefits, and alternatives to ICD pulse generator replacement were discussed in detail today.  The patient understands that risks include but are not limited to bleeding, infection, pneumothorax, perforation, tamponade, vascular damage, renal  failure, MI, stroke, death, inappropriate shocks, damage to his existing leads, and lead dislodgement and wishes to proceed.  We will therefore schedule the procedure at the next available time. Hold coumadin 2 days prior to the procedure. See Pace Art report No changes today  2. HTN Stable No change required today  3. CAD No ischemic symptoms No changes  4. Atrial flutter (2011)/ atrial fibrillation requiring cardioversion (2/19) Well controlled On coumadin due to costs of DOAC therapy  Thompson Grayer MD, [facility] 01/03/2018 9:19 AM

## 2018-01-05 ENCOUNTER — Other Ambulatory Visit (INDEPENDENT_AMBULATORY_CARE_PROVIDER_SITE_OTHER): Payer: Medicare Other

## 2018-01-05 DIAGNOSIS — R109 Unspecified abdominal pain: Secondary | ICD-10-CM | POA: Diagnosis not present

## 2018-01-05 DIAGNOSIS — E1140 Type 2 diabetes mellitus with diabetic neuropathy, unspecified: Secondary | ICD-10-CM

## 2018-01-05 DIAGNOSIS — E1165 Type 2 diabetes mellitus with hyperglycemia: Secondary | ICD-10-CM

## 2018-01-05 DIAGNOSIS — IMO0002 Reserved for concepts with insufficient information to code with codable children: Secondary | ICD-10-CM

## 2018-01-05 LAB — POCT URINALYSIS DIP (MANUAL ENTRY)
Bilirubin, UA: NEGATIVE
Blood, UA: NEGATIVE
GLUCOSE UA: NEGATIVE mg/dL
Ketones, POC UA: NEGATIVE mg/dL
Leukocytes, UA: NEGATIVE
NITRITE UA: NEGATIVE
Spec Grav, UA: 1.025 (ref 1.010–1.025)
Urobilinogen, UA: 1 E.U./dL
pH, UA: 6 (ref 5.0–8.0)

## 2018-01-05 LAB — MICROALBUMIN / CREATININE URINE RATIO
CREATININE, U: 145.2 mg/dL
MICROALB UR: 5 mg/dL — AB (ref 0.0–1.9)
Microalb Creat Ratio: 3.5 mg/g (ref 0.0–30.0)

## 2018-01-06 LAB — URINE CULTURE
MICRO NUMBER: 90793407
SPECIMEN QUALITY: ADEQUATE

## 2018-01-07 NOTE — Progress Notes (Signed)
Dr Marigene Ehlers interpretation of your lab work:  Your urine tests were all NORMAL. You do not have any signs of a kidney stone or infection. You have a small amount of protein in your urine, however it is not considered to be an abnormal amount. We do not need to do any further testing at this point. Please let us know if your symptoms worsen or do not improve over the next couple of weeks.    If you have any additional questions, please give [place] a call or send [place] a message through [place].  Take care, Dr Jerline Pain

## 2018-01-17 ENCOUNTER — Ambulatory Visit (INDEPENDENT_AMBULATORY_CARE_PROVIDER_SITE_OTHER): Payer: Medicare Other | Admitting: *Deleted

## 2018-01-17 DIAGNOSIS — Z5181 Encounter for therapeutic drug level monitoring: Secondary | ICD-10-CM | POA: Diagnosis not present

## 2018-01-17 DIAGNOSIS — I483 Typical atrial flutter: Secondary | ICD-10-CM | POA: Diagnosis not present

## 2018-01-17 LAB — POCT INR: INR: 2.8 (ref 2.0–3.0)

## 2018-01-17 NOTE — Patient Instructions (Signed)
Description   Continue same dose of Coumadin 1.5 tablets (7.5mg ) daily. Call if any questions or problems [phone]. Recheck INR 4 weeks per pt request advised of risks.

## 2018-01-24 ENCOUNTER — Other Ambulatory Visit: Payer: Self-pay | Admitting: Cardiology

## 2018-01-31 ENCOUNTER — Telehealth: Payer: Self-pay | Admitting: Internal Medicine

## 2018-01-31 NOTE — Telephone Encounter (Signed)
New Message        Patient needs clarification on where and what time his surgery is. Surgery is tomorrow.

## 2018-01-31 NOTE — Telephone Encounter (Signed)
Melissa to call Pt to provide information.

## 2018-02-01 ENCOUNTER — Ambulatory Visit (HOSPITAL_COMMUNITY)
Admission: RE | Admit: 2018-02-01 | Discharge: 2018-02-01 | Disposition: A | Discharge status: Home / Self Care | Payer: Medicare Other | Source: Ambulatory Visit | Attending: Internal Medicine | Admitting: Internal Medicine

## 2018-02-01 ENCOUNTER — Encounter (HOSPITAL_COMMUNITY)
Admission: RE | Disposition: A | Discharge status: Home / Self Care | Payer: Self-pay | Source: Ambulatory Visit | Attending: Internal Medicine

## 2018-02-01 ENCOUNTER — Other Ambulatory Visit: Payer: Self-pay

## 2018-02-01 ENCOUNTER — Encounter (HOSPITAL_COMMUNITY): Payer: Medicare Other | Admitting: Cardiology

## 2018-02-01 DIAGNOSIS — Z7984 Long term (current) use of oral hypoglycemic drugs: Secondary | ICD-10-CM | POA: Diagnosis not present

## 2018-02-01 DIAGNOSIS — I481 Persistent atrial fibrillation: Secondary | ICD-10-CM | POA: Insufficient documentation

## 2018-02-01 DIAGNOSIS — M1990 Unspecified osteoarthritis, unspecified site: Secondary | ICD-10-CM | POA: Insufficient documentation

## 2018-02-01 DIAGNOSIS — Z7901 Long term (current) use of anticoagulants: Secondary | ICD-10-CM | POA: Insufficient documentation

## 2018-02-01 DIAGNOSIS — N183 Chronic kidney disease, stage 3 (moderate): Secondary | ICD-10-CM | POA: Diagnosis not present

## 2018-02-01 DIAGNOSIS — I252 Old myocardial infarction: Secondary | ICD-10-CM | POA: Insufficient documentation

## 2018-02-01 DIAGNOSIS — E1122 Type 2 diabetes mellitus with diabetic chronic kidney disease: Secondary | ICD-10-CM | POA: Diagnosis not present

## 2018-02-01 DIAGNOSIS — Z4502 Encounter for adjustment and management of automatic implantable cardiac defibrillator: Secondary | ICD-10-CM | POA: Diagnosis not present

## 2018-02-01 DIAGNOSIS — I130 Hypertensive heart and chronic kidney disease with heart failure and stage 1 through stage 4 chronic kidney disease, or unspecified chronic kidney disease: Secondary | ICD-10-CM | POA: Diagnosis not present

## 2018-02-01 DIAGNOSIS — I2510 Atherosclerotic heart disease of native coronary artery without angina pectoris: Secondary | ICD-10-CM | POA: Insufficient documentation

## 2018-02-01 DIAGNOSIS — I5082 Biventricular heart failure: Secondary | ICD-10-CM | POA: Diagnosis not present

## 2018-02-01 DIAGNOSIS — E1140 Type 2 diabetes mellitus with diabetic neuropathy, unspecified: Secondary | ICD-10-CM | POA: Insufficient documentation

## 2018-02-01 DIAGNOSIS — I350 Nonrheumatic aortic (valve) stenosis: Secondary | ICD-10-CM | POA: Insufficient documentation

## 2018-02-01 DIAGNOSIS — K219 Gastro-esophageal reflux disease without esophagitis: Secondary | ICD-10-CM | POA: Diagnosis not present

## 2018-02-01 DIAGNOSIS — I255 Ischemic cardiomyopathy: Secondary | ICD-10-CM | POA: Insufficient documentation

## 2018-02-01 HISTORY — PX: ICD GENERATOR CHANGEOUT: EP1231

## 2018-02-01 LAB — PROTIME-INR
INR: 1.37
PROTHROMBIN TIME: 16.8 s — AB (ref 11.4–15.2)

## 2018-02-01 LAB — GLUCOSE, CAPILLARY
Glucose-Capillary: 137 mg/dL — ABNORMAL HIGH (ref 70–99)
Glucose-Capillary: 125 mg/dL — ABNORMAL HIGH (ref 70–99)

## 2018-02-01 LAB — SURGICAL PCR SCREEN
MRSA, PCR: NEGATIVE
STAPHYLOCOCCUS AUREUS: NEGATIVE

## 2018-02-01 SURGERY — ICD GENERATOR CHANGEOUT

## 2018-02-01 MED ORDER — MUPIROCIN 2 % EX OINT
TOPICAL_OINTMENT | CUTANEOUS | Status: AC
  Administered 2018-02-01: 13:00:00
  Filled 2018-02-01: qty 22

## 2018-02-01 MED ORDER — ONDANSETRON HCL 4 MG/2ML IJ SOLN: 4.0000 mg | Freq: Four times a day (QID) | INTRAMUSCULAR | Status: DC | PRN

## 2018-02-01 MED ORDER — ACETAMINOPHEN 325 MG PO TABS
325.0000 mg | ORAL_TABLET | ORAL | Status: DC | PRN
  Filled 2018-02-01: qty 2

## 2018-02-01 MED ORDER — SODIUM CHLORIDE 0.9% FLUSH: 3.0000 mL | Freq: Two times a day (BID) | INTRAVENOUS | Status: DC

## 2018-02-01 MED ORDER — SODIUM CHLORIDE 0.9 % IV SOLN
80.0000 mg | INTRAVENOUS | Status: AC
  Administered 2018-02-01: 80 mg
  Filled 2018-02-01: qty 2

## 2018-02-01 MED ORDER — LIDOCAINE HCL (PF) 1 % IJ SOLN
INTRAMUSCULAR | Status: AC
  Filled 2018-02-01: qty 30

## 2018-02-01 MED ORDER — CEFAZOLIN SODIUM-DEXTROSE 2-4 GM/100ML-% IV SOLN
INTRAVENOUS | Status: AC
  Filled 2018-02-01: qty 100

## 2018-02-01 MED ORDER — SODIUM CHLORIDE 0.9% FLUSH: 3.0000 mL | INTRAVENOUS | Status: DC | PRN

## 2018-02-01 MED ORDER — CEFAZOLIN SODIUM-DEXTROSE 2-4 GM/100ML-% IV SOLN
2.0000 g | INTRAVENOUS | Status: AC
  Administered 2018-02-01: 2 g via INTRAVENOUS
  Filled 2018-02-01: qty 100

## 2018-02-01 MED ORDER — LIDOCAINE HCL (PF) 1 % IJ SOLN
INTRAMUSCULAR | Status: DC | PRN
  Administered 2018-02-01: 45 mL

## 2018-02-01 MED ORDER — SODIUM CHLORIDE 0.9 % IV SOLN: 250.0000 mL | INTRAVENOUS | Status: DC | PRN

## 2018-02-01 MED ORDER — SODIUM CHLORIDE 0.9 % IV SOLN
INTRAVENOUS | Status: AC
  Filled 2018-02-01: qty 2

## 2018-02-01 MED ORDER — SODIUM CHLORIDE 0.9 % IV SOLN
INTRAVENOUS | Status: DC
  Administered 2018-02-01: 13:00:00 via INTRAVENOUS

## 2018-02-01 SURGICAL SUPPLY — 7 items
CABLE SURGICAL S-101-97-12 (CABLE) ×2 IMPLANT
DEVICE DISSECT PLASMABLAD 3.0S (MISCELLANEOUS) ×1 IMPLANT
ICD EVERA XT MRI DF1  DDMB1D1 (ICD Generator) ×1 IMPLANT
ICD EVERA XT MRI DF1 DDMB1D1 (ICD Generator) ×1 IMPLANT
PAD DEFIB LIFELINK (PAD) ×2 IMPLANT
PLASMABLADE 3.0S (MISCELLANEOUS) ×2
TRAY PACEMAKER INSERTION (PACKS) ×2 IMPLANT

## 2018-02-01 NOTE — Discharge Instructions (Signed)
Post procedure site care Keep incision clean and dry for 10 days. No driving for 2 days.  You can remove outer dressing tomorrow. Leave steri-strips (little pieces of tape) on until seen in the office for wound check appointment. Call the office [phone]) for redness, drainage, swelling, or fever.   Pacemaker Battery Change, Care After This sheet gives you information about how to care for yourself after your procedure. Your health care provider may also give you more specific instructions. If you have problems or questions, contact your health care provider. What can I expect after the procedure? After your procedure, it is common to have:  Pain or soreness at the site where the pacemaker was inserted.  Swelling at the site where the pacemaker was inserted.  Follow these instructions at home: Incision care  Keep the incision clean and dry. ? Do not take baths, swim, or use a hot tub until your health care provider approves. ? You may shower the day after your procedure, or as directed by your health care provider. ? Pat the area dry with a clean towel. Do not rub the area. This may cause bleeding.  Follow instructions from your health care provider about how to take care of your incision. Make sure you: ? Wash your hands with soap and water before you change your bandage (dressing). If soap and water are not available, use hand sanitizer. ? Change your dressing as told by your health care provider. ? Leave stitches (sutures), skin glue, or adhesive strips in place. These skin closures may need to stay in place for 2 weeks or longer. If adhesive strip edges start to loosen and curl up, you may trim the loose edges. Do not remove adhesive strips completely unless your health care provider tells you to do that.  Check your incision area every day for signs of infection. Check for: ? More redness, swelling, or pain. ? More fluid or blood. ? Warmth. ? Pus or a bad  smell. Activity  Do not lift anything that is heavier than 10 lb (4.5 kg) until your health care provider says it is okay to do so.  For the first 2 weeks, or as long as told by your health care provider: ? Avoid lifting your left arm higher than your shoulder. ? Be gentle when you move your arms over your head. It is okay to raise your arm to comb your hair. ? Avoid strenuous exercise.  Ask your health care provider when it is okay to: ? Resume your normal activities. ? Return to work or school. ? Resume sexual activity. Eating and drinking  Eat a heart-healthy diet. This should include plenty of fresh fruits and vegetables, whole grains, low-fat dairy products, and lean protein like chicken and fish.  Limit alcohol intake to no more than 1 drink a day for non-pregnant women and 2 drinks a day for men. One drink equals 12 oz of beer, 5 oz of wine, or 1 oz of hard liquor.  Check ingredients and nutrition facts on packaged foods and beverages. Avoid the following types of food: ? Food that is high in salt (sodium). ? Food that is high in saturated fat, like full-fat dairy or red meat. ? Food that is high in trans fat, like fried food. ? Food and drinks that are high in sugar. Lifestyle  Do not use any products that contain nicotine or tobacco, such as cigarettes and e-cigarettes. If you need help quitting, ask your health care provider.  Take steps to manage and control your weight.  Get regular exercise. Aim for 150 minutes of moderate-intensity exercise (such as walking or yoga) or 75 minutes of vigorous exercise (such as running or swimming) each week.  Manage other health problems, such as diabetes or high blood pressure. Ask your health care provider how you can manage these conditions. General instructions  Do not drive for 24 hours after your procedure if you were given a medicine to help you relax (sedative).  Take over-the-counter and prescription medicines only as told  by your health care provider.  Avoid putting pressure on the area where the pacemaker was placed.  If you need an MRI after your pacemaker has been placed, be sure to tell the health care provider who orders the MRI that you have a pacemaker.  Avoid close and prolonged exposure to electrical devices that have strong magnetic fields. These include: ? Cell phones. Avoid keeping them in a pocket near the pacemaker, and try using the ear opposite the pacemaker. ? MP3 players. ? Household appliances, like microwaves. ? Metal detectors. ? Electric generators. ? High-tension wires.  Keep all follow-up visits as directed by your health care provider. This is important. Contact a health care provider if:  You have pain at the incision site that is not relieved by over-the-counter or prescription medicines.  You have any of these around your incision site or coming from it: ? More redness, swelling, or pain. ? Fluid or blood. ? Warmth to the touch. ? Pus or a bad smell.  You have a fever.  You feel brief, occasional palpitations, light-headedness, or any symptoms that you think might be related to your heart. Get help right away if:  You experience chest pain that is different from the pain at the pacemaker site.  You develop a red streak that extends above or below the incision site.  You experience shortness of breath.  You have palpitations or an irregular heartbeat.  You have light-headedness that does not go away quickly.  You faint or have dizzy spells.  Your pulse suddenly drops or increases rapidly and does not return to normal.  You begin to gain weight and your legs and ankles swell. Summary  After your procedure, it is common to have pain, soreness, and some swelling where the pacemaker was inserted.  Make sure to keep your incision clean and dry. Follow instructions from your health care provider about how to take care of your incision.  Check your incision every  day for signs of infection, such as more pain or swelling, pus or a bad smell, warmth, or leaking fluid and blood.  Avoid strenuous exercise and lifting your left arm higher than your shoulder for 2 weeks, or as long as told by your health care provider. This information is not intended to replace advice given to you by your health care provider. Make sure you discuss any questions you have with your health care provider. Document Released: 04/12/2013 Document Revised: 05/14/2016 Document Reviewed: 05/14/2016 Elsevier Interactive Patient Education  2017 [employer].

## 2018-02-01 NOTE — Interval H&P Note (Signed)
History and Physical Interval Note:  02/01/2018 1:55 PM  Anthony Rubio.  has presented today for surgery, with the diagnosis of ERI  The various methods of treatment have been discussed with the patient and family. After consideration of risks, benefits and other options for treatment, the patient has consented to  Procedure(s): ICD [place] (N/A) as a surgical intervention .  The patient's history has been reviewed, patient examined, no change in status, stable for surgery.  I have reviewed the patient's chart and labs.  Questions were answered to the patient's satisfaction.     ICD Criteria  Current LVEF:25%. Within 12 months prior to implant: Yes   Heart failure history: Yes, Class II  Cardiomyopathy history: Yes, Ischemic Cardiomyopathy - Prior MI.  Atrial Fibrillation/Atrial Flutter: Yes, Persistent (> 7 Days).  Ventricular tachycardia history: No.  Cardiac arrest history: No.  History of syndromes with risk of sudden death: No.  Previous ICD: Yes, Reason for ICD:  Primary prevention.  Current ICD indication: Primary  PPM indication: No.   Class I or II Bradycardia indication present: No  Beta Blocker therapy for 3 or more months: Yes, prescribed.   Ace Inhibitor/ARB therapy for 3 or more months: No, medical reason.  Has not tolerated even low dose losartan previously and has had issues with renal failure.   Thompson Grayer

## 2018-02-02 ENCOUNTER — Encounter (HOSPITAL_COMMUNITY): Payer: Self-pay | Admitting: Internal Medicine

## 2018-02-14 ENCOUNTER — Ambulatory Visit (INDEPENDENT_AMBULATORY_CARE_PROVIDER_SITE_OTHER): Payer: Medicare Other | Admitting: Pharmacist

## 2018-02-14 ENCOUNTER — Ambulatory Visit (INDEPENDENT_AMBULATORY_CARE_PROVIDER_SITE_OTHER): Payer: Medicare Other | Admitting: *Deleted

## 2018-02-14 DIAGNOSIS — I483 Typical atrial flutter: Secondary | ICD-10-CM

## 2018-02-14 DIAGNOSIS — I5022 Chronic systolic (congestive) heart failure: Secondary | ICD-10-CM

## 2018-02-14 DIAGNOSIS — Z5181 Encounter for therapeutic drug level monitoring: Secondary | ICD-10-CM | POA: Diagnosis not present

## 2018-02-14 DIAGNOSIS — Z95810 Presence of automatic (implantable) cardiac defibrillator: Secondary | ICD-10-CM

## 2018-02-14 DIAGNOSIS — I255 Ischemic cardiomyopathy: Secondary | ICD-10-CM

## 2018-02-14 LAB — CUP PACEART INCLINIC DEVICE CHECK
Battery Remaining Longevity: 127 mo
Battery Voltage: 3.11 V
Brady Statistic RA Percent Paced: 34.59 %
HIGH POWER IMPEDANCE MEASURED VALUE: 41 Ohm
HighPow Impedance: 48 Ohm
Implantable Lead Implant Date: 20111108
Implantable Lead Location: 753859
Implantable Lead Location: 753860
Implantable Lead Model: 6947
Implantable Pulse Generator Implant Date: 20190730
Lead Channel Impedance Value: 342 Ohm
Lead Channel Pacing Threshold Pulse Width: 0.4 ms
Lead Channel Pacing Threshold Pulse Width: 0.4 ms
Lead Channel Sensing Intrinsic Amplitude: 3 mV
Lead Channel Sensing Intrinsic Amplitude: 7.375 mV
Lead Channel Setting Pacing Amplitude: 2 V
Lead Channel Setting Pacing Amplitude: 2.5 V
Lead Channel Setting Pacing Pulse Width: 0.4 ms
MDC IDC LEAD IMPLANT DT: 20111108
MDC IDC MSMT LEADCHNL RA IMPEDANCE VALUE: 475 Ohm
MDC IDC MSMT LEADCHNL RA PACING THRESHOLD AMPLITUDE: 0.75 V
MDC IDC MSMT LEADCHNL RV IMPEDANCE VALUE: 285 Ohm
MDC IDC MSMT LEADCHNL RV PACING THRESHOLD AMPLITUDE: 1 V
MDC IDC SESS DTM: 20190812095251
MDC IDC SET LEADCHNL RV SENSING SENSITIVITY: 0.3 mV
MDC IDC STAT BRADY AP VP PERCENT: 0.03 %
MDC IDC STAT BRADY AP VS PERCENT: 34.87 %
MDC IDC STAT BRADY AS VP PERCENT: 0.04 %
MDC IDC STAT BRADY AS VS PERCENT: 65.06 %
MDC IDC STAT BRADY RV PERCENT PACED: 0.06 %

## 2018-02-14 LAB — POCT INR: INR: 1.4 — AB (ref 2.0–3.0)

## 2018-02-14 NOTE — Progress Notes (Signed)
Wound check appointment. Steri-strips removed. Wound without redness or edema. Incision edges approximated, wound well healed. Superficial abrasion noted inferior to incision, no drainage noted, antibiotic ointment and bandaid applied per patient request. Patient reports pruritus across left chest extending to left axilla for a few days post-procedure, no erythema or lesions noted (except those described above). Patient reports he took a half-tab of amoxicillin 875mg  that he had leftover at home from a dental procedure and feels this helped immensely. Patient's wife reports she gave him "allergy medicine" and feels that may have helped. He is unsure if it could be related to CHG scrub as he did not notice pruritus until after the procedure. Encouraged patient to call PCP if itching returns, and to call our office for any signs/symptoms of infection. Advised patient to wash site well with soap and water daily and to avoid taking medications that haven't been prescribed without first checking with pharmacist or provider. Patient and wife verbalize understanding of instructions.  Normal device function. Thresholds, sensing, and impedances consistent with implant measurements. Device programmed at chronic outputs, s/p generator replacement. Histogram distribution appropriate for patient and level of activity. No mode switches or ventricular arrhythmias noted. Patient educated about wound care, arm mobility, shock plan, and Carelink monitor. ROV with JA on 05/11/18.

## 2018-02-14 NOTE — Patient Instructions (Signed)
Description   Take and extra 1/2 tablet today (since already took 1.5 tablets today) then take 2 tablets tomorrow then Continue same dose of Coumadin 1.5 tablets (7.5mg ) daily. Call if any questions or problems [phone]. Recheck INR 4 weeks per pt request advised of risks.

## 2018-02-27 ENCOUNTER — Other Ambulatory Visit (HOSPITAL_COMMUNITY): Payer: Self-pay | Admitting: Cardiology

## 2018-03-02 ENCOUNTER — Telehealth: Payer: Self-pay | Admitting: Family Medicine

## 2018-03-02 NOTE — Telephone Encounter (Signed)
Copied from [place] [id number]. Topic: General - Other >> Mar 02, 2018  2:35 PM Bea Graff, NT wrote: Reason for CRM: patient requesting a letter that states he cannot serve jury duty since he had his second defibrillator put in a few weeks ago, that he has neuropathy in his feet, and diabetes. Please call pt when letter is ready to be picked up.

## 2018-03-04 NOTE — Telephone Encounter (Signed)
Can you do this

## 2018-03-09 NOTE — Telephone Encounter (Signed)
Letter has been written and is awaiting Dr. Marigene Ehlers signature.

## 2018-03-09 NOTE — Telephone Encounter (Signed)
Anthony Rubio with letter stating:  "It is my medical opinion that Anthony Rubio should be excused from [employer] due to his medical conditions including diabetic neuropathy"  Algis Greenhouse. Jerline Pain, MD 03/09/2018 9:55 AM

## 2018-03-10 NOTE — Telephone Encounter (Signed)
Letter has been signed and placed up front for pick up.  Patient notified.

## 2018-03-11 ENCOUNTER — Ambulatory Visit (INDEPENDENT_AMBULATORY_CARE_PROVIDER_SITE_OTHER): Payer: Medicare Other | Admitting: Pharmacist

## 2018-03-11 DIAGNOSIS — Z5181 Encounter for therapeutic drug level monitoring: Secondary | ICD-10-CM

## 2018-03-11 DIAGNOSIS — I483 Typical atrial flutter: Secondary | ICD-10-CM

## 2018-03-11 LAB — POCT INR: INR: 3.1 — AB (ref 2.0–3.0)

## 2018-03-11 NOTE — Patient Instructions (Signed)
Description   Tomorrow take only 1/2 tablet then Continue same dose of Coumadin 1.5 tablets (7.5mg ) daily. Call if any questions or problems [phone]. Recheck INR 4 weeks per pt request advised of risks.

## 2018-03-18 ENCOUNTER — Other Ambulatory Visit: Payer: Self-pay | Admitting: Internal Medicine

## 2018-04-08 ENCOUNTER — Ambulatory Visit (INDEPENDENT_AMBULATORY_CARE_PROVIDER_SITE_OTHER): Payer: Medicare Other | Admitting: *Deleted

## 2018-04-08 DIAGNOSIS — I483 Typical atrial flutter: Secondary | ICD-10-CM | POA: Diagnosis not present

## 2018-04-08 DIAGNOSIS — Z5181 Encounter for therapeutic drug level monitoring: Secondary | ICD-10-CM

## 2018-04-08 LAB — POCT INR: INR: 2.3 (ref 2.0–3.0)

## 2018-04-08 NOTE — Patient Instructions (Signed)
Description    Continue same dose of Coumadin 1.5 tablets (7.5mg ) daily. Call if any questions or problems [phone]. Recheck INR 4 weeks.

## 2018-04-10 DIAGNOSIS — Z23 Encounter for immunization: Secondary | ICD-10-CM | POA: Diagnosis not present

## 2018-04-18 NOTE — Progress Notes (Signed)
No ICM remote transmission received for 03/29/2018 and next ICM transmission scheduled for 06/13/2018.  Office defib check scheduled 05/11/2018 with Dr Rayann Heman.

## 2018-05-06 ENCOUNTER — Other Ambulatory Visit (HOSPITAL_COMMUNITY): Payer: Self-pay | Admitting: Cardiology

## 2018-05-11 ENCOUNTER — Ambulatory Visit (INDEPENDENT_AMBULATORY_CARE_PROVIDER_SITE_OTHER): Payer: Medicare Other | Admitting: Internal Medicine

## 2018-05-11 ENCOUNTER — Ambulatory Visit (INDEPENDENT_AMBULATORY_CARE_PROVIDER_SITE_OTHER): Payer: Medicare Other | Admitting: *Deleted

## 2018-05-11 ENCOUNTER — Encounter: Payer: Self-pay | Admitting: Internal Medicine

## 2018-05-11 VITALS — BP 118/76 | HR 82 | Ht 75.0 in | Wt 231.6 lb

## 2018-05-11 DIAGNOSIS — I483 Typical atrial flutter: Secondary | ICD-10-CM

## 2018-05-11 DIAGNOSIS — I4819 Other persistent atrial fibrillation: Secondary | ICD-10-CM

## 2018-05-11 DIAGNOSIS — I5022 Chronic systolic (congestive) heart failure: Secondary | ICD-10-CM

## 2018-05-11 DIAGNOSIS — I255 Ischemic cardiomyopathy: Secondary | ICD-10-CM

## 2018-05-11 DIAGNOSIS — I429 Cardiomyopathy, unspecified: Secondary | ICD-10-CM | POA: Diagnosis not present

## 2018-05-11 DIAGNOSIS — Z5181 Encounter for therapeutic drug level monitoring: Secondary | ICD-10-CM | POA: Diagnosis not present

## 2018-05-11 LAB — POCT INR: INR: 1.7 — AB (ref 2.0–3.0)

## 2018-05-11 NOTE — Patient Instructions (Signed)
Description    Today take 2 tablets, then Continue same dose of Coumadin 1.5 tablets (7.5mg ) daily. Call if any questions or problems [phone]. Recheck INR 4 weeks.

## 2018-05-11 NOTE — Progress Notes (Signed)
PCP: Vivi Barrack, MD Primary Cardiologist: Dr Aundra Dubin Primary EP: Dr Rayann Heman  Anthony Rubio. is a 67 y.o. male who presents today for routine electrophysiology followup.  Since his ICD generator change procedure, the patient reports doing very well.  Today, he denies symptoms of palpitations, chest pain, shortness of breath,  lower extremity edema, dizziness, presyncope, syncope, or ICD shocks.  The patient is otherwise without complaint today.   Past Medical History:  Diagnosis Date  . AICD (automatic cardioverter/defibrillator) present   . Aortic stenosis    a. Mild by echo 04/2011.   . Arthritis   . Atrial flutter ([place])    s/p DCCV 9/11  . CAD (coronary artery disease)    a. LHC (5/11) with 50-60% mid LAD, diffuse mod distal LAD up to 60-70%, small OM2 subtotally occ, mod-severe diffuse distal PLOM, severe diffuse distal RCA, PDA, and PLV  disease. Started on Plavix given concern for thrombus formation in the ectatic cors with slow flow. b. Stopped Plavix after starting Coumadin for a-flutter.  . CHF (congestive heart failure) ([place])    a. Likely mixed ICM/NICM. Echo (5/11) with EF 15-20%, severe global HK, mild MR, mod d/d, mildly decreased RV fcn. RHC (5/11) with mean RA 14 mmHg, PA 38/26, mean PCWP 25 mmHg, CI 1.8.  b. TEE (9/11): EF 15% no LV thrombus, no LAA thrombus. c. s/p ICD 11/11. d. Last echo 04/2011: LV severely dilated, EF 25%, diffuse HK, mild AS, mildly dilated LA, trivial pericardial effusion.  . CKD (chronic kidney disease), stage III ([place])   . Complication of anesthesia    Slow to awaken  . Diabetic neuropathy (HCC)    severe; with chronic gait dysfunction  . Dysrhythmia   . Foot ulcer, left ([place])    poor healing  . GERD (gastroesophageal reflux disease)    H.pyloric  . History of echocardiogram 07/2015   Echo 1/17: EF 20-25%, inf-lat AK, restrictive physio, mild AS (mean 9 mmHg), mild AI, mod MR, severe LAE, mod reduced RVSF, mild RAE, mild TR, PASP 50 mmHg  .  HTN (hypertension)   . ICD (implantable cardiac defibrillator) in place 11/11   Medtronic dual cha,ber ICD with Optivol  . Neuropathy associated with endocrine disorder (HCC)    feet anad finger tips  . Tobacco abuse   . Uncontrolled type II diabetes mellitus ([place])    Past Surgical History:  Procedure Laterality Date  . CARDIOVERSION N/A 08/23/2017   Procedure: CARDIOVERSION;  Surgeon: Larey Dresser, MD;  Location: [facility] ENDOSCOPY;  Service: Cardiovascular;  Laterality: N/A;  . COLONOSCOPY    . COLONOSCOPY WITH PROPOFOL N/A 12/01/2017   Procedure: COLONOSCOPY WITH PROPOFOL;  Surgeon: Arta Silence, MD;  Location: [place];  Service: Endoscopy;  Laterality: N/A;  . ESOPHAGOGASTRODUODENOSCOPY    . ICD GENERATOR CHANGEOUT N/A 02/01/2018   Procedure: ICD GENERATOR CHANGEOUT;  Surgeon: Thompson Grayer, MD;  Location: [place] CV LAB;  Service: Cardiovascular;  Laterality: N/A;  . ICD implantation  2011   by JA  . TEE WITHOUT CARDIOVERSION N/A 08/23/2017   Procedure: TRANSESOPHAGEAL ECHOCARDIOGRAM (TEE);  Surgeon: Larey Dresser, MD;  Location[facility] ENDOSCOPY;  Service: Cardiovascular;  Laterality: N/A;    ROS- all systems are reviewed and negative except as per HPI above  Current Outpatient Medications  Medication Sig Dispense Refill  . atorvastatin (LIPITOR) 80 MG tablet Take 1 tablet (80 mg total) by mouth daily at 6 PM. 30 tablet 3  . atorvastatin (LIPITOR) 80  MG tablet TAKE 1 TABLET BY MOUTH ONCE DAILY 90 tablet 1  . carvedilol (COREG) 6.25 MG tablet Take 3 tablets (18.75 mg total) by mouth 2 (two) times daily. 180 tablet 3  . cyclobenzaprine (FLEXERIL) 10 MG tablet Take 1 tablet (10 mg total) by mouth 3 (three) times daily as needed for muscle spasms. (Patient not taking: Reported on 01/24/2018) 30 tablet 0  . furosemide (LASIX) 40 MG tablet Take 1 tablet (40 mg total) by mouth every morning AND 0.5 tablets (20 mg total) every evening. 135 tablet 0  . glipiZIDE (GLUCOTROL XL) 10 MG  24 hr tablet Take 1 tablet (10 mg total) by mouth daily with breakfast. 90 tablet 3  . isosorbide mononitrate (IMDUR) 30 MG 24 hr tablet TAKE 1/2 (ONE-HALF) TABLET BY MOUTH ONCE DAILY.  STOP BIDIL. 45 tablet 1  . pantoprazole (PROTONIX) 40 MG tablet Take 20 mg by mouth daily as needed (heartburn).     Marland Kitchen spironolactone (ALDACTONE) 25 MG tablet TAKE 1 TABLET BY MOUTH ONCE DAILY 90 tablet 2  . warfarin (COUMADIN) 5 MG tablet TAKE AS DIRECTED BY COUMADIN CLINIC (Patient taking differently: Take 7.5mg  by mouth daily, except Thursday take 5mg ) 50 tablet 2   No current facility-administered medications for this visit.     Physical Exam: Vitals:   05/11/18 1244  BP: 118/76  Pulse: 82  SpO2: 96%  Weight: 231 lb 9.6 oz (105.1 kg)  Height: 6\' 3"  (1.905 m)    GEN- The patient is well appearing, alert and oriented x 3 today.   Head- normocephalic, atraumatic Eyes-  Sclera clear, conjunctiva pink Ears- hearing intact Oropharynx- clear Lungs- Clear to ausculation bilaterally, normal work of breathing Chest- ICD pocket is well healed Heart- Regular rate and rhythm, no murmurs, rubs or gallops, PMI not laterally displaced GI- soft, NT, ND, + BS Extremities- no clubbing, cyanosis, or edema  ICD interrogation- reviewed in detail today,  See PACEART report  ekg tracing ordered today is personally reviewed and shows sinus rhythm 82 bpm, PR 212 msec, QRS 138 msec with IVCD  Wt Readings from Last 3 Encounters:  05/11/18 231 lb 9.6 oz (105.1 kg)  02/01/18 230 lb (104.3 kg)  01/03/18 228 lb (103.4 kg)    Assessment and Plan:  1.  Chronic systolic dysfunction euvolemic today Stable on an appropriate medical regimen Normal ICD function See Pace Art report No changes today followed in ICM device clinic  2. HTN Stable No change required today  3. CAD No ischemic symptoms  4. Atrial flutter/ atrial fibrllation No episodes since last generator change Last sustained episode requiring  cardioversion was 2/19 On coumadin  Carelink Return to see EP NP in a year  Thompson Grayer MD, [facility] 05/11/2018 12:57 PM

## 2018-05-11 NOTE — Patient Instructions (Signed)
Medication Instructions:  Your physician recommends that you continue on your current medications as directed. Please refer to the Current Medication list given to you today.  If you need a refill on your cardiac medications before your next appointment, please call your pharmacy.   Lab work: None Ordered  If you have labs (blood work) drawn today and your tests are completely normal, you will receive your results only by: Marland Kitchen MyChart Message (if you have MyChart) OR . A paper copy in the mail If you have any lab test that is abnormal or we need to change your treatment, we will call you to review the results.  Testing/Procedures: None ordered  Follow-Up: Remote monitoring is used to monitor your ICD from home. This monitoring reduces the number of office visits required to check your device to one time per year. It allows [place] to keep an eye on the functioning of your device to ensure it is working properly. You are scheduled for a device check from home on 06/13/18. You may send your transmission at any time that day. If you have a wireless device, the transmission will be sent automatically. After your physician reviews your transmission, you will receive a postcard with your next transmission date. Your physician wants you to follow-up in: 1 year with Chanetta Marshall, NP. You will receive a reminder letter in the mail two months in advance. If you don't receive a letter, please call our office to schedule the follow-up appointment.  Any Other Special Instructions Will Be Listed Below (If Applicable).

## 2018-05-13 LAB — CUP PACEART INCLINIC DEVICE CHECK
Date Time Interrogation Session: 20191108104317
Implantable Lead Implant Date: 20111108
Implantable Lead Location: 753859
Implantable Lead Location: 753860
Implantable Lead Model: 5076
MDC IDC LEAD IMPLANT DT: 20111108
MDC IDC PG IMPLANT DT: 20190730

## 2018-06-08 ENCOUNTER — Encounter: Payer: Medicare Other | Admitting: Internal Medicine

## 2018-06-13 ENCOUNTER — Ambulatory Visit (INDEPENDENT_AMBULATORY_CARE_PROVIDER_SITE_OTHER): Payer: Medicare Other

## 2018-06-13 DIAGNOSIS — Z95810 Presence of automatic (implantable) cardiac defibrillator: Secondary | ICD-10-CM | POA: Diagnosis not present

## 2018-06-13 DIAGNOSIS — I5022 Chronic systolic (congestive) heart failure: Secondary | ICD-10-CM

## 2018-06-13 NOTE — Progress Notes (Signed)
EPIC Encounter for ICM Monitoring  Patient Name: Anthony Rubio. is a 67 y.o. male Date: 06/13/2018 Primary Care Physican: Vivi Barrack, MD Primary Cardiologist:[place] Electrophysiologist: Allred Last Weight: 231lbs Today's Weight:  unknown       Heart Failure questions reviewed, pt asymptomatic.  He thinks decreased impedance is from eating too much holiday foods that were high in salt.  He reported he will cut back in salt.   Thoracic impedance abnormal suggesting fluid accumulation starting 06/05/2018.   Prescribed: Furosemide to 40 mg 1 tablet every AM and 0.5 tablet(20 mg total)every PM.   Labs: 09/02/2017 Creatinine2.19, BUN29, Potassium4.0, Sodium142, [id number] 08/31/2017 Creatinine2.00, BUN27, Potassium4.6, [id number], [id number]  08/06/2017 Creatinine2.46, BUN40, Potassium4.7, [id number], [id number] 06/18/2017 Creatinine1.99, BUN36, Potassium4.5, Sodium139, [id number]  05/20/2017 Creatinine1.53, BUN28, Potassium5.0, Sodium140, [id number]  12/02/2016 Creatinine1.63, BUN24, Potassium4.7, [id number], [id number] A complete set of results can be found in Results Review  Recommendations: Reinforced limiting salt intake to < 2000 mg daily and fluid intake to 64 oz daily.  Encouraged to call for fluid symptoms.  Follow-up plan: ICM clinic phone appointment on 06/20/2018 to recheck fluid levels.   Last visit with Dr Aundra Dubin was 08/31/2017  Copy of ICM check sent to Dr. Rayann Heman and Dr Aundra Dubin for review and will call back if any recommendations.    3 month ICM trend: 06/13/2018    1 Year ICM trend:       [name], RN 06/13/2018 2:03 PM

## 2018-06-20 ENCOUNTER — Ambulatory Visit (INDEPENDENT_AMBULATORY_CARE_PROVIDER_SITE_OTHER): Payer: Medicare Other

## 2018-06-20 DIAGNOSIS — Z95810 Presence of automatic (implantable) cardiac defibrillator: Secondary | ICD-10-CM

## 2018-06-20 DIAGNOSIS — I5022 Chronic systolic (congestive) heart failure: Secondary | ICD-10-CM

## 2018-06-21 NOTE — Progress Notes (Signed)
EPIC Encounter for ICM Monitoring  Patient Name: Anthony Rubio. is a 67 y.o. male Date: 06/21/2018 Primary Care Physican: Vivi Barrack, MD Primary Cardiologist:[place] Electrophysiologist: Allred Last Weight: 231lbs Today's Weight:  unknown                                                           Heart Failure questions reviewed, pt asymptomatic.     Thoracic impedance returned to normal.   Prescribed: Furosemide to 40 mg 1 tablet every AM and 0.5 tablet(20 mg total)every PM.   Labs: 09/02/2017 Creatinine2.19, BUN29, Potassium4.0, Sodium142, [id number] 08/31/2017 Creatinine2.00, BUN27, Potassium4.6, [id number], [id number]  08/06/2017 Creatinine2.46, BUN40, Potassium4.7, [id number], [id number] 06/18/2017 Creatinine1.99, BUN36, Potassium4.5, Sodium139, [id number]  05/20/2017 Creatinine1.53, BUN28, Potassium5.0, Sodium140, [id number]  12/02/2016 Creatinine1.63, BUN24, Potassium4.7, [id number], [id number] A complete set of results can be found in Results Review  Recommendations: No changes.   Encouraged to call for fluid symptoms.  Follow-up plan: ICM clinic phone appointment on 07/21/2018.   Last visit with Dr Aundra Dubin was 08/31/2017  Copy of ICM check sent to Dr. Rayann Heman   3 month ICM trend: 06/20/2018    1 Year ICM trend:       [name], RN 06/21/2018 2:05 PM

## 2018-06-30 ENCOUNTER — Other Ambulatory Visit: Payer: Self-pay | Admitting: Cardiology

## 2018-07-04 ENCOUNTER — Telehealth: Payer: Self-pay | Admitting: Pharmacist

## 2018-07-04 NOTE — Telephone Encounter (Signed)
le

## 2018-07-04 NOTE — Telephone Encounter (Signed)
Left message. Pt is overdue for INR check

## 2018-07-13 ENCOUNTER — Ambulatory Visit (INDEPENDENT_AMBULATORY_CARE_PROVIDER_SITE_OTHER): Payer: Medicare Other | Admitting: *Deleted

## 2018-07-13 DIAGNOSIS — I483 Typical atrial flutter: Secondary | ICD-10-CM | POA: Diagnosis not present

## 2018-07-13 DIAGNOSIS — Z5181 Encounter for therapeutic drug level monitoring: Secondary | ICD-10-CM | POA: Diagnosis not present

## 2018-07-13 LAB — POCT INR: INR: 2.5 (ref 2.0–3.0)

## 2018-07-13 MED ORDER — WARFARIN SODIUM 5 MG PO TABS: ORAL_TABLET | ORAL | 0 refills | Status: DC

## 2018-07-13 NOTE — Patient Instructions (Signed)
Description   Continue same dose of Coumadin 1.5 tablets (7.5mg ) daily. Call if any questions or problems [phone]. Recheck INR 6 weeks.

## 2018-07-21 ENCOUNTER — Ambulatory Visit (INDEPENDENT_AMBULATORY_CARE_PROVIDER_SITE_OTHER): Payer: Medicare Other

## 2018-07-21 DIAGNOSIS — Z95810 Presence of automatic (implantable) cardiac defibrillator: Secondary | ICD-10-CM

## 2018-07-21 DIAGNOSIS — I5022 Chronic systolic (congestive) heart failure: Secondary | ICD-10-CM | POA: Diagnosis not present

## 2018-07-22 NOTE — Progress Notes (Signed)
EPIC Encounter for ICM Monitoring  Patient Name: Nas Wafer. is a 68 y.o. male Date: 07/22/2018 Primary Care Physican: Vivi Barrack, MD Primary Cardiologist:[place] Electrophysiologist: Allred LastWeight: 231lbs Today's Weight: unknown   Heart Failure questions reviewed, pt asymptomatic.  Thoracic impedance returned to normal.   Prescribed:Furosemide to 40 mg 1 tablet every AM and 0.5 tablet(20 mg total)every PM.   Labs: 09/02/2017 Creatinine2.19, BUN29, Potassium4.0, Sodium142, [id number] 08/31/2017 Creatinine2.00, BUN27, Potassium4.6, [id number], [id number]  08/06/2017 Creatinine2.46, BUN40, Potassium4.7, [id number], [id number] 06/18/2017 Creatinine1.99, BUN36, Potassium4.5, Sodium139, [id number]  05/20/2017 Creatinine1.53, BUN28, Potassium5.0, Sodium140, [id number]  12/02/2016 Creatinine1.63, BUN24, Potassium4.7, [id number], [id number] A complete set of results can be found in Results Review  Recommendations:No changes.  Encouraged to call for fluid symptoms.  Follow-up plan: ICM clinic phone appointment on1/16/2020.Last visit with Dr Aundra Dubin was 08/31/2017  Copy of ICM check sent to Dr.Allred  3 month ICM trend: 07/21/2018    1 Year ICM trend:       [name], RN 07/22/2018 3:07 PM

## 2018-08-04 ENCOUNTER — Ambulatory Visit (INDEPENDENT_AMBULATORY_CARE_PROVIDER_SITE_OTHER): Payer: Medicare Other

## 2018-08-04 VITALS — BP 118/82 | HR 65 | Temp 98.3°F | Ht 75.0 in | Wt 231.4 lb

## 2018-08-04 DIAGNOSIS — Z0000 Encounter for general adult medical examination without abnormal findings: Secondary | ICD-10-CM | POA: Diagnosis not present

## 2018-08-04 DIAGNOSIS — E1140 Type 2 diabetes mellitus with diabetic neuropathy, unspecified: Secondary | ICD-10-CM | POA: Diagnosis not present

## 2018-08-04 DIAGNOSIS — E1165 Type 2 diabetes mellitus with hyperglycemia: Secondary | ICD-10-CM

## 2018-08-04 DIAGNOSIS — IMO0002 Reserved for concepts with insufficient information to code with codable children: Secondary | ICD-10-CM

## 2018-08-04 MED ORDER — ACCU-CHEK SOFT TOUCH LANCETS MISC: 3 refills | Status: AC

## 2018-08-04 NOTE — Patient Instructions (Signed)
Anthony Rubio , Thank you for taking time to come for your Medicare Wellness Visit. I appreciate your ongoing commitment to your health goals. Please review the following plan we discussed and let me know if I can assist you in the future.   These are the goals we discussed: Goals    . Quit Smoking     Smokes about 7 cigarettes a day now, would like to get it down to about 3       This is a list of the screening recommended for you and due dates:  Health Maintenance  Topic Date Due  . Eye exam for diabetics  11/29/2015  . Complete foot exam   06/01/2018  . Hemoglobin A1C  06/28/2018  . Urine Protein Check  01/06/2019  . Tetanus Vaccine  02/13/2026  . Colon Cancer Screening  12/02/2027  . Flu Shot  Completed  .  Hepatitis C: One time screening is recommended by Center for Disease Control  (CDC) for  adults born from [birth year] through 1966.   Completed  . Pneumonia vaccines  Completed    Preventive Care for Adults  A healthy lifestyle and preventive care can promote health and wellness. Preventive health guidelines for adults include the following key practices.  . A routine yearly physical is a good way to check with your health care provider about your health and preventive screening. It is a chance to share any concerns and updates on your health and to receive a thorough exam.  . Visit your dentist for a routine exam and preventive care every 6 months. Brush your teeth twice a day and floss once a day. Good oral hygiene prevents tooth decay and gum disease.  . The frequency of eye exams is based on your age, health, family medical history, use  of contact lenses, and other factors. Follow your health care provider's recommendations for frequency of eye exams.  . Eat a healthy diet. Foods like vegetables, fruits, whole grains, low-fat dairy products, and lean protein foods contain the nutrients you need without too many calories. Decrease your intake of foods high in solid fats, added  sugars, and salt. Eat the right amount of calories for you. Get information about a proper diet from your health care provider, if necessary.  . Regular physical exercise is one of the most important things you can do for your health. Most adults should get at least 150 minutes of moderate-intensity exercise (any activity that increases your heart rate and causes you to sweat) each week. In addition, most adults need muscle-strengthening exercises on 2 or more days a week.  Silver Sneakers may be a benefit available to you. To determine eligibility, you may visit the website: www.silversneakers.com or contact program at [phone] Mon-Fri between 8AM-8PM.   . Maintain a healthy weight. The body mass index (BMI) is a screening tool to identify possible weight problems. It provides an estimate of body fat based on height and weight. Your health care provider can find your BMI and can help you achieve or maintain a healthy weight.   For adults 20 years and older: ? A BMI below 18.5 is considered underweight. ? A BMI of 18.5 to 24.9 is normal. ? A BMI of 25 to 29.9 is considered overweight. ? A BMI of 30 and above is considered obese.   . Maintain normal blood lipids and cholesterol levels by exercising and minimizing your intake of saturated fat. Eat a balanced diet with plenty of fruit and vegetables. Blood  tests for lipids and cholesterol should begin at age 33 and be repeated every 5 years. If your lipid or cholesterol levels are high, you are over 50, or you are at high risk for heart disease, you may need your cholesterol levels checked more frequently. Ongoing high lipid and cholesterol levels should be treated with medicines if diet and exercise are not working.  . If you smoke, find out from your health care provider how to quit. If you do not use tobacco, please do not start.  . If you choose to drink alcohol, please do not consume more than 2 drinks per day. One drink is considered to  be 12 ounces (355 mL) of beer, 5 ounces (148 mL) of wine, or 1.5 ounces (44 mL) of liquor.  . If you are 51-13 years old, ask your health care provider if you should take aspirin to prevent strokes.  . Use sunscreen. Apply sunscreen liberally and repeatedly throughout the day. You should seek shade when your shadow is shorter than you. Protect yourself by wearing long sleeves, pants, a wide-brimmed hat, and sunglasses year round, whenever you are outdoors.  . Once a month, do a whole body skin exam, using a mirror to look at the skin on your back. Tell your health care provider of new moles, moles that have irregular borders, moles that are larger than a pencil eraser, or moles that have changed in shape or color.

## 2018-08-04 NOTE — Progress Notes (Signed)
Subjective:   Anthony Rubio. is a 68 y.o. male who presents for Medicare Annual/Subsequent preventive examination.  Review of Systems:  No ROS.  Medicare Wellness Visit. Additional risk factors are reflected in the social history. Cardiac Risk Factors include: advanced age (>49men, >36 women);male gender;smoking/ tobacco exposure Patient lives with wife of 28 years ina 2 story home. He has 6 children, 2 who have passed away. He enjoys working on his computer, playing cards, and watching tv. His mobility makes it hard for him to do a lot of physical activity. He has not reestablished at a church here.  Patient goes to bed around 2:00am. He normally wakes up around 6:30-7am. No CPAP. Naps during the day for about 1.5 hours.    Objective:    Vitals: BP 118/82 (BP Location: Left Arm, Patient Position: Sitting, Cuff Size: Large)   Pulse 65   Temp 98.3 F (36.8 C) (Oral)   Ht 6\' 3"  (1.905 m)   Wt 231 lb 6.4 oz (105 kg)   SpO2 97%   BMI 28.92 kg/m   Body mass index is 28.92 kg/m.  Advanced Directives 08/04/2018 02/01/2018 12/01/2017 09/02/2017 08/23/2017  Does Patient Have a Medical Advance Directive? No No No No No  Would patient like information on creating a medical advance directive? Yes (MAU/Ambulatory/Procedural Areas - Information given) No - Patient declined No - Patient declined - No - Patient declined   Per the physician order, and with the permission of the patient, > than 20 minutes were spent discussing Medical Advanced Directive, [place], and Living Will. All questions were answered and patients verbalized understanding. Packet provided. Tobacco Social History   Tobacco Use  Smoking Status Current Some Day Smoker  . Packs/day: 0.25  . Years: 33.00  . Pack years: 8.25  . Types: Cigarettes  Smokeless Tobacco Never Used  Tobacco Comment   about 1/4 ppd. Married, lives with wife. 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 Married, lives with wife. 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 AICD (automatic cardioverter/defibrillator) present   . Aortic stenosis    a. Mild by echo 04/2011.   . Arthritis   . Atrial flutter ([place])    s/p DCCV 9/11  . CAD (coronary artery disease)    a. LHC (5/11) with 50-60% mid LAD, diffuse mod distal LAD up to 60-70%, small OM2 subtotally occ, mod-severe diffuse distal PLOM, severe diffuse distal RCA, PDA, and PLV  disease. Started on Plavix given concern for thrombus formation in the ectatic cors with slow flow. b. Stopped Plavix after starting Coumadin for a-flutter.  . CHF (congestive heart failure) ([place])    a. Likely mixed ICM/NICM. Echo (5/11) with EF 15-20%, severe global HK, mild MR, mod d/d, mildly decreased RV fcn. RHC (5/11) with mean RA 14 mmHg, PA 38/26, mean PCWP 25 mmHg, CI 1.8.  b. TEE (9/11): EF 15% no LV thrombus, no LAA thrombus. c. s/p ICD 11/11. d. Last echo 04/2011: LV severely dilated, EF 25%, diffuse HK, mild AS, mildly dilated LA, trivial pericardial effusion.  . CKD (chronic kidney disease), stage III ([place])   . Complication of anesthesia    Slow to awaken  . Diabetic neuropathy (HCC)    severe; with chronic gait dysfunction  . Dysrhythmia   .  Foot ulcer, left ([place])    poor healing  . GERD (gastroesophageal reflux disease)    H.pyloric  . History of echocardiogram 07/2015   Echo 1/17: EF 20-25%, inf-lat AK, restrictive physio, mild AS (mean 9 mmHg), mild AI, mod MR, severe LAE, mod reduced RVSF, mild RAE, mild TR, PASP 50 mmHg  . HTN (hypertension)   . ICD (implantable cardiac defibrillator) in place 11/11   Medtronic dual cha,ber ICD with Optivol  . Neuropathy associated with endocrine disorder (HCC)    feet anad finger tips  . Tobacco abuse   . Uncontrolled type II diabetes mellitus ([place])    Past Surgical  History:  Procedure Laterality Date  . CARDIOVERSION N/A 08/23/2017   Procedure: CARDIOVERSION;  Surgeon: Larey Dresser, MD;  Location: [facility] ENDOSCOPY;  Service: Cardiovascular;  Laterality: N/A;  . COLONOSCOPY    . COLONOSCOPY WITH PROPOFOL N/A 12/01/2017   Procedure: COLONOSCOPY WITH PROPOFOL;  Surgeon: Arta Silence, MD;  Location: [place];  Service: Endoscopy;  Laterality: N/A;  . ESOPHAGOGASTRODUODENOSCOPY    . ICD GENERATOR CHANGEOUT N/A 02/01/2018   Procedure: ICD GENERATOR CHANGEOUT;  Surgeon: Thompson Grayer, MD;  Location: [place] CV LAB;  Service: Cardiovascular;  Laterality: N/A;  . ICD implantation  2011   by JA  . TEE WITHOUT CARDIOVERSION N/A 08/23/2017   Procedure: TRANSESOPHAGEAL ECHOCARDIOGRAM (TEE);  Surgeon: Larey Dresser, MD;  Location: [facility] ENDOSCOPY;  Service: Cardiovascular;  Laterality: N/A;   Family History  Problem Relation Age of Onset  . Stroke Mother   . Early death Father   . Congestive Heart Failure Maternal Grandmother   . Gout Maternal Grandfather   . Diabetes Paternal Grandfather   . Cancer Paternal Grandfather   . Heart attack Son   . Early death Son    Social History   Socioeconomic History  . Marital status: Married    Spouse name: Not on file  . Number of children: Not on file  . Years of education: Not on file  . Highest education level: Not on file  Occupational History  . Occupation: Retired Barrister's clerk  . Occupation: Former [employer]  . Financial resource strain: Not on file  . Food insecurity:    Worry: Not on file    Inability: Not on file  . Transportation needs:    Medical: Not on file    Non-medical: Not on file  Tobacco Use  . Smoking status: Current Some Day Smoker    Packs/day: 0.25    Years: 33.00    Pack years: 8.25    Types: Cigarettes  . Smokeless tobacco: Never Used  . Tobacco comment: about 1/4 ppd. Married, lives with wife. 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 Alcohol use: No  . Drug use: No  . Sexual activity: Not Currently  Lifestyle  . Physical activity:    Days per week: Not on file    Minutes per session: Not on file  . Stress: Not on file  Relationships  . Social connections:    Talks on phone: Not on file    Gets together: Not on file    Attends religious service: Not on file    Active member of club or organization: Not on file    Attends meetings of clubs or organizations: Not on file    Relationship status: Not on file  Other Topics Concern  . Not on file  Social History Narrative  . Not on file    Outpatient Encounter Medications as of 08/04/2018  Medication Sig  . atorvastatin (LIPITOR) 80 MG tablet Take 1 tablet (80 mg total) by mouth daily at 6 PM.  . carvedilol (COREG) 6.25 MG tablet Take 3 tablets (18.75 mg total) by mouth 2 (two) times daily.  . cyclobenzaprine (FLEXERIL) 10 MG tablet Take 1 tablet (10 mg total) by mouth 3 (three) times daily as needed for muscle spasms.  . furosemide (LASIX) 40 MG tablet Take 1 tablet (40 mg total) by mouth every morning AND 0.5 tablets (20 mg total) every evening.  Marland Kitchen glipiZIDE (GLUCOTROL XL) 10 MG 24 hr tablet Take 1 tablet (10 mg total) by mouth daily with breakfast.  . isosorbide mononitrate (IMDUR) 30 MG 24 hr tablet TAKE 1/2 (ONE-HALF) TABLET BY MOUTH ONCE DAILY.  STOP BIDIL.  Marland Kitchen pantoprazole (PROTONIX) 40 MG tablet Take 20 mg by mouth daily as needed (heartburn).   Marland Kitchen spironolactone (ALDACTONE) 25 MG tablet TAKE 1 TABLET BY MOUTH ONCE DAILY  . warfarin (COUMADIN) 5 MG tablet TAKE AS DIRECTED BY  COUMADIN  CLINIC  . [DISCONTINUED] atorvastatin (LIPITOR) 80 MG tablet TAKE 1 TABLET BY MOUTH ONCE DAILY   No facility-administered encounter medications on file as of 08/04/2018.     Activities of Daily Living In your present state of health, do you have any difficulty performing the following activities: 08/04/2018  Hearing? N  Vision? N  Difficulty  concentrating or making decisions? N  Walking or climbing stairs? Y  Dressing or bathing? N  Doing errands, shopping? N  Preparing Food and eating ? N  Using the Toilet? N  In the past six months, have you accidently leaked urine? N  Do you have problems with loss of bowel control? N  Managing your Medications? N  Managing your Finances? N  Housekeeping or managing your Housekeeping? N  Some recent data might be hidden    Patient Care Team: Vivi Barrack, MD as PCP - General (Family Medicine) Thompson Grayer, MD as PCP - Electrophysiology (Cardiology) Larey Dresser, MD (Cardiology)   Assessment:   This is a routine wellness examination for Jakyrie.  Exercise Activities and Dietary recommendations Current Exercise Habits: The patient does not participate in regular exercise at present, Exercise limited by: orthopedic condition(s)  Breakfast: Strawberry applesauce, yogurt, Jimmy Dean croissant, bowl of Cheerios, a banana , cup of coffee with hazelnut creamer  Lunch:Normally skips lunch  Dinner: Can of soup, Club Crackers, Scallops, Spaghetti and meatballs, chicken salad, salisbury steak, normally drinks ginger ale, Pepsi, or Coke Goals    . Quit Smoking     Smokes about 7 cigarettes a day now, would like to get it down to about 3       Fall Risk Fall Risk  08/04/2018 06/01/2017 01/26/2017 10/23/2013 06/24/2012  Falls in the past year? 0 No No No No  Comment - - Emmi Telephone Survey: data to providers prior to load - -  Risk for fall due to : Impaired balance/gait;Impaired mobility - - Impaired balance/gait;Impaired mobility Impaired balance/gait    Depression Screen PHQ 2/9 Scores 08/04/2018 06/01/2017 10/23/2013 06/24/2012  PHQ - 2 Score 1 0 0 0    Cognitive Function MMSE - Mini Mental State Exam 08/04/2018  Orientation to time 5  Orientation to Place 5  Registration 3  Attention/ Calculation 5  Recall 3  Language- name 2 objects 2  Language- repeat 1  Language-  follow 3  step command 3  Language- read & follow direction 1  Write a sentence 1  Copy design 1  Total score 30        Immunization History  Administered Date(s) Administered  . Influenza Whole 04/03/2010  . Influenza, Seasonal, Injecte, Preservative Fre 06/24/2012  . Influenza,inj,Quad PF,6+ Mos 05/03/2014  . Influenza-Unspecified 06/06/2015, 05/07/2016  . Pneumococcal Conjugate-13 02/14/2016  . Pneumococcal Polysaccharide-23 11/06/2009, 06/01/2017  . Tdap 02/14/2016      Screening Tests Health Maintenance  Topic Date Due  . OPHTHALMOLOGY EXAM  11/29/2015  . INFLUENZA VACCINE  02/03/2018  . FOOT EXAM  06/01/2018  . HEMOGLOBIN A1C  06/28/2018  . URINE MICROALBUMIN  01/06/2019  . TETANUS/TDAP  02/13/2026  . COLONOSCOPY  12/02/2027  . Hepatitis C Screening  Completed  . PNA vac Low Risk Adult  Completed         Plan:   Follow Up as Advised by PCP  I have personally reviewed and noted the following in the patient's chart:   . Medical and social history . Use of alcohol, tobacco or illicit drugs  . Current medications and supplements . Functional ability and status . Nutritional status . Physical activity . Advanced directives . List of other physicians . Vitals . Screenings to include cognitive, depression, and falls . Referrals and appointments  In addition, I have reviewed and discussed with patient certain preventive protocols, quality metrics, and best practice recommendations. A written personalized care plan for preventive services as well as general preventive health recommendations were provided to patient.     [place], [place]  2/48/2500

## 2018-08-04 NOTE — Progress Notes (Signed)
PCP notes: Last OV 12/27/2017   Health maintenance: Eye Exam-will call and schedule                Flu shot-updated    Foot Exam-completed    Hemoglobin A1C-next week   Abnormal screenings: MMSE score of 30   Patient concerns: None   Nurse concerns:Provided packet for Medical Advanced Directive   Next PCP appt: Schedule before he leaves today-scheduled for next week

## 2018-08-04 NOTE — Progress Notes (Signed)
I have personally reviewed the Medicare Annual Wellness Visit and agree with the assessment and plan.  Algis Greenhouse. Jerline Pain, MD 08/04/2018 5:16 PM

## 2018-08-10 ENCOUNTER — Encounter: Payer: Self-pay | Admitting: Family Medicine

## 2018-08-10 ENCOUNTER — Ambulatory Visit (INDEPENDENT_AMBULATORY_CARE_PROVIDER_SITE_OTHER): Payer: Medicare Other | Admitting: Family Medicine

## 2018-08-10 VITALS — BP 118/68 | HR 62 | Temp 98.1°F | Ht 75.0 in | Wt 229.4 lb

## 2018-08-10 DIAGNOSIS — N183 Chronic kidney disease, stage 3 unspecified: Secondary | ICD-10-CM

## 2018-08-10 DIAGNOSIS — K219 Gastro-esophageal reflux disease without esophagitis: Secondary | ICD-10-CM | POA: Insufficient documentation

## 2018-08-10 DIAGNOSIS — Z122 Encounter for screening for malignant neoplasm of respiratory organs: Secondary | ICD-10-CM

## 2018-08-10 DIAGNOSIS — IMO0002 Reserved for concepts with insufficient information to code with codable children: Secondary | ICD-10-CM

## 2018-08-10 DIAGNOSIS — I152 Hypertension secondary to endocrine disorders: Secondary | ICD-10-CM

## 2018-08-10 DIAGNOSIS — I10 Essential (primary) hypertension: Secondary | ICD-10-CM | POA: Diagnosis not present

## 2018-08-10 DIAGNOSIS — E1169 Type 2 diabetes mellitus with other specified complication: Secondary | ICD-10-CM

## 2018-08-10 DIAGNOSIS — F17210 Nicotine dependence, cigarettes, uncomplicated: Secondary | ICD-10-CM

## 2018-08-10 DIAGNOSIS — E1165 Type 2 diabetes mellitus with hyperglycemia: Secondary | ICD-10-CM | POA: Diagnosis not present

## 2018-08-10 DIAGNOSIS — E785 Hyperlipidemia, unspecified: Secondary | ICD-10-CM

## 2018-08-10 DIAGNOSIS — E1140 Type 2 diabetes mellitus with diabetic neuropathy, unspecified: Secondary | ICD-10-CM

## 2018-08-10 DIAGNOSIS — E1159 Type 2 diabetes mellitus with other circulatory complications: Secondary | ICD-10-CM | POA: Diagnosis not present

## 2018-08-10 DIAGNOSIS — Z79899 Other long term (current) drug therapy: Secondary | ICD-10-CM

## 2018-08-10 LAB — COMPREHENSIVE METABOLIC PANEL
ALBUMIN: 3.9 g/dL (ref 3.5–5.2)
ALT: 17 U/L (ref 0–53)
AST: 15 U/L (ref 0–37)
Alkaline Phosphatase: 74 U/L (ref 39–117)
BILIRUBIN TOTAL: 0.6 mg/dL (ref 0.2–1.2)
BUN: 32 mg/dL — AB (ref 6–23)
CALCIUM: 9.2 mg/dL (ref 8.4–10.5)
CO2: 24 mEq/L (ref 19–32)
CREATININE: 1.75 mg/dL — AB (ref 0.40–1.50)
Chloride: 103 mEq/L (ref 96–112)
GFR: 47.01 mL/min — ABNORMAL LOW (ref >60.00)
Glucose, Bld: 154 mg/dL — ABNORMAL HIGH (ref 70–99)
Potassium: 4.8 mEq/L (ref 3.5–5.1)
SODIUM: 140 meq/L (ref 135–145)
TOTAL PROTEIN: 6.8 g/dL (ref 6.0–8.3)

## 2018-08-10 LAB — LIPID PANEL
CHOL/HDL RATIO: 6
Cholesterol: 151 mg/dL (ref 0–200)
HDL: 27.2 mg/dL — AB (ref >39.00)
LDL Cholesterol: 106 mg/dL — ABNORMAL HIGH (ref 0–99)
NONHDL: 124.13
Triglycerides: 90 mg/dL (ref 0.0–149.0)
VLDL: 18 mg/dL (ref 0.0–40.0)

## 2018-08-10 LAB — CBC
HCT: 44.9 % (ref 39.0–52.0)
Hemoglobin: 14.8 g/dL (ref 13.0–17.0)
MCHC: 32.9 g/dL (ref 30.0–36.0)
MCV: 86.1 fl (ref 78.0–100.0)
PLATELETS: 166 10*3/uL (ref 150.0–400.0)
RBC: 5.22 Mil/uL (ref 4.22–5.81)
RDW: 16.1 % — ABNORMAL HIGH (ref 11.5–15.5)
WBC: 5.4 10*3/uL (ref 4.0–10.5)

## 2018-08-10 LAB — TSH: TSH: 1.81 u[IU]/mL (ref 0.35–4.50)

## 2018-08-10 LAB — POCT GLYCOSYLATED HEMOGLOBIN (HGB A1C): HEMOGLOBIN A1C: 7.3 % — AB (ref 4.0–5.6)

## 2018-08-10 LAB — VITAMIN B12: VITAMIN B 12: 697 pg/mL (ref 211–911)

## 2018-08-10 NOTE — Progress Notes (Signed)
   Chief Complaint:  Anthony Rubio. is a 68 y.o. male who presents today with a chief complaint of T2DM.   Assessment/Plan:  Type 2 diabetes, uncontrolled, with neuropathy (HCC) A1c stable at 7.3.  Continue glipizide 10 mg daily.  Continue low-carb diet and regular exercise.  Follow-up in 6 months.  SMOKER We will check low-dose CT scan to screen for lung cancer.  Hypertension associated with diabetes ([place]) At goal.  Continue current regimen per cardiology.  Check CBC, CMAC, and TSH.  Cigarette smoker Encouraged cessation.  Check CT scan to screen for lung cancer.  Dyslipidemia associated with type 2 diabetes mellitus (HCC) Continue Lipitor 80 mg daily.  Check lipid panel.  Chronic kidney disease, stage III (moderate) (HCC) Check C met.  GERD (gastroesophageal reflux disease) Continue Protonix as needed.  Check B12 with blood draw.  Preventative Healthcare Will check low dose CT to scan for lung cancer.     Subjective:  HPI:  # T2DM  - On glipizide '10mg'$  daily and tolerating well - Home sugars typically in the upper 100s - Has been trying to walk more and cut down on carbs - ROS: No reported polyuria or polydipsia  # Dyslipidemia / Coronary Artery Disease  - On lipitor '80mg'$  daily - ROS: No myalgias  # GERD - On protonix '20mg'$  daily as needed  % CKD Stage 3  %  Essential Hypertension / Heart Failure with Reduced Ejection Fraction with ICD in place / Atrial Flutter - Follows with Cardiology - Anticoagulated on warfarin - On coreg 18.'75mg'$  twice daily, imdur '15mg'$  daily, lasix '40mg'$  in the morning and '20mg'$  at night, and spironolactone '25mg'$  daily    ROS: Per HPI  PMH: He reports that he has been smoking cigarettes. He has a 8.25 pack-year smoking history. He has never used smokeless tobacco. He reports that he does not drink alcohol or use drugs.      Objective:  Physical Exam: BP 118/68 (BP Location: Left Arm, Patient Position: Sitting, Cuff Size: Normal)    Pulse 62   Temp 98.1 F (36.7 C) (Oral)   Ht '6\' 3"'$  (1.905 m)   Wt 229 lb 6.1 oz (104 kg)   SpO2 97%   BMI 28.67 kg/m   Gen: NAD, resting comfortably CV: Regular rate and rhythm with no murmurs appreciated Pulm: Normal work of breathing, clear to auscultation bilaterally with no crackles, wheezes, or rhonchi Results for orders placed or performed in visit on 08/10/18 (from the past 24 hour(s))  POCT HgB A1C     Status: Abnormal   Collection Time: 08/10/18  8:19 AM  Result Value Ref Range   Hemoglobin A1C 7.3 (A) 4.0 - 5.6 %        Chelsea Pedretti M. Jerline Pain, MD 08/10/2018 8:47 AM

## 2018-08-10 NOTE — Patient Instructions (Signed)
It was very nice to see you today!  No changes today.  Keep up the good work.  We will check blood work.  We will get a CT scan of your lungs to screen for lung cancer  Come back in 6 months, or sooner as needed.   Take care, Dr Jerline Pain

## 2018-08-10 NOTE — Assessment & Plan Note (Signed)
At goal.  Continue current regimen per cardiology.  Check CBC, CMAC, and TSH.

## 2018-08-10 NOTE — Assessment & Plan Note (Signed)
Continue Lipitor 80 mg daily.  Check lipid panel.

## 2018-08-10 NOTE — Assessment & Plan Note (Signed)
We will check low-dose CT scan to screen for lung cancer.

## 2018-08-10 NOTE — Assessment & Plan Note (Signed)
A1c stable at 7.3.  Continue glipizide 10 mg daily.  Continue low-carb diet and regular exercise.  Follow-up in 6 months.

## 2018-08-10 NOTE — Assessment & Plan Note (Signed)
Check C met.

## 2018-08-10 NOTE — Assessment & Plan Note (Signed)
Continue Protonix as needed.  Check B12 with blood draw.

## 2018-08-10 NOTE — Assessment & Plan Note (Addendum)
Encouraged cessation.  Check CT scan to screen for lung cancer.

## 2018-08-11 ENCOUNTER — Other Ambulatory Visit: Payer: Self-pay

## 2018-08-11 DIAGNOSIS — F17210 Nicotine dependence, cigarettes, uncomplicated: Secondary | ICD-10-CM

## 2018-08-11 DIAGNOSIS — Z122 Encounter for screening for malignant neoplasm of respiratory organs: Secondary | ICD-10-CM

## 2018-08-11 NOTE — Progress Notes (Signed)
Please inform patient of the following:  His blood work is all stable. Continue current meds and we can recheck in a year.  Algis Greenhouse. Jerline Pain, MD 08/11/2018 5:13 PM

## 2018-08-16 ENCOUNTER — Other Ambulatory Visit: Payer: Self-pay

## 2018-08-16 ENCOUNTER — Telehealth: Payer: Self-pay | Admitting: Family Medicine

## 2018-08-16 DIAGNOSIS — F17210 Nicotine dependence, cigarettes, uncomplicated: Secondary | ICD-10-CM

## 2018-08-16 NOTE — Telephone Encounter (Signed)
Copied from [place] [id number]. Topic: General - Inquiry >> Aug 16, 2018  2:06 PM Keene Breath wrote: Reason for CRM: Langley Gauss, with [facility], called to get clarification on a referral that was sent in.  She wants to know if the patient will be in the lung cancer screening program.  If so, the referral would need to be changed.  Please advise and call back at [phone]

## 2018-08-16 NOTE — Telephone Encounter (Signed)
See note

## 2018-08-16 NOTE — Telephone Encounter (Signed)
New referral placed.

## 2018-08-19 ENCOUNTER — Other Ambulatory Visit (HOSPITAL_COMMUNITY): Payer: Self-pay | Admitting: Cardiology

## 2018-08-22 ENCOUNTER — Ambulatory Visit (INDEPENDENT_AMBULATORY_CARE_PROVIDER_SITE_OTHER): Payer: Medicare Other

## 2018-08-22 DIAGNOSIS — Z95810 Presence of automatic (implantable) cardiac defibrillator: Secondary | ICD-10-CM | POA: Diagnosis not present

## 2018-08-22 DIAGNOSIS — I5022 Chronic systolic (congestive) heart failure: Secondary | ICD-10-CM

## 2018-08-23 ENCOUNTER — Telehealth: Payer: Self-pay

## 2018-08-23 NOTE — Telephone Encounter (Signed)
Spoke with patient to remind of missed remote transmission 

## 2018-08-24 NOTE — Progress Notes (Signed)
EPIC Encounter for ICM Monitoring  Patient Name: Anthony Rubio. is a 68 y.o. male Date: 08/24/2018 Primary Care Physican: Vivi Barrack, MD Primary Cardiologist:[place] Electrophysiologist: Allred LastWeight: 231lbs Today's Weight: 230 unknown   Heart Failure questions reviewed, pt asymptomatic.  Thoracic impedancereturned tonormal.  Prescribed:Furosemide to 40 mg 1 tablet every AM and 0.5 tablet(20 mg total)every PM.   Labs: 08/10/2018 Creatinine 1.75, BUN 32, Potassium 4.8, Sodium 140 01/03/2018 Creatinine 1.62, BUN 23, Potassium 4.5, Sodium 143, GFR 43-50 09/02/2017 Creatinine2.19, BUN29, Potassium4.0, Sodium142, GFR29-34 08/31/2017 Creatinine2.00, BUN27, Potassium4.6, [id number], KPQ24-49  08/06/2017 Creatinine2.46, BUN40, Potassium4.7, [id number], YTR17-35 06/18/2017 Creatinine1.99, BUN36, Potassium4.5, Sodium139, APO14-10  05/20/2017 Creatinine1.53, BUN28, Potassium5.0, Sodium140, VUD31-43  12/02/2016 Creatinine1.63, BUN24, Potassium4.7, [id number], QAS60-15 A complete set of results can be found in Results Review  Recommendations: No changes.Encouraged to call for fluid symptoms.  Follow-up plan: ICM clinic phone appointment on3/23/2020.Last visit with Dr Aundra Dubin was 08/31/2017  Copy of ICM check sent to Dr.Allred.  3 month ICM trend: 08/23/2018    1 Year ICM trend:       [name], RN 08/24/2018 9:58 AM

## 2018-08-25 ENCOUNTER — Ambulatory Visit (INDEPENDENT_AMBULATORY_CARE_PROVIDER_SITE_OTHER): Payer: Medicare Other | Admitting: *Deleted

## 2018-08-25 DIAGNOSIS — Z5181 Encounter for therapeutic drug level monitoring: Secondary | ICD-10-CM

## 2018-08-25 DIAGNOSIS — I483 Typical atrial flutter: Secondary | ICD-10-CM

## 2018-08-25 LAB — POCT INR: INR: 2.7 (ref 2.0–3.0)

## 2018-08-25 NOTE — Patient Instructions (Signed)
Description   Continue same dose of Coumadin 1.5 tablets (7.5mg ) daily. Call if any questions or problems [phone]. Recheck INR 6 weeks.

## 2018-09-01 ENCOUNTER — Other Ambulatory Visit: Payer: Self-pay | Admitting: Cardiology

## 2018-09-05 ENCOUNTER — Other Ambulatory Visit (HOSPITAL_COMMUNITY): Payer: Self-pay

## 2018-09-16 ENCOUNTER — Telehealth: Payer: Self-pay | Admitting: Podiatry

## 2018-09-16 NOTE — Telephone Encounter (Signed)
Patient stated that he wanted to talk to Dr. Prudence Davidson. Please give patient a call

## 2018-09-26 ENCOUNTER — Other Ambulatory Visit: Payer: Self-pay

## 2018-09-26 ENCOUNTER — Ambulatory Visit (INDEPENDENT_AMBULATORY_CARE_PROVIDER_SITE_OTHER): Payer: Medicare Other

## 2018-09-26 DIAGNOSIS — Z95810 Presence of automatic (implantable) cardiac defibrillator: Secondary | ICD-10-CM

## 2018-09-26 DIAGNOSIS — I5022 Chronic systolic (congestive) heart failure: Secondary | ICD-10-CM | POA: Diagnosis not present

## 2018-09-27 NOTE — Progress Notes (Signed)
EPIC Encounter for ICM Monitoring  Patient Name: Anthony Rubio. is a 68 y.o. male Date: 09/27/2018 Primary Care Physican: Vivi Barrack, MD Primary Cardiologist:McLean Electrophysiologist: Allred LastWeight: 456YBW 09/28/2018 Weight: 228 lbs   Heart Failure questions reviewed, pt asymptomatic.  Thoracic impedanceclose to baseline normal.  Prescribed:Furosemide to 40 mg 1 tablet every AM and 0.5 tablet(20 mg total)every PM.   Labs: 08/10/2018 Creatinine 1.75, BUN 32, Potassium 4.8, Sodium 140, GFR 47.01 01/03/2018 Creatinine 1.62, BUN 23, Potassium 4.5, Sodium 143, GFR 43-50 09/02/2017 Creatinine2.19, BUN29, Potassium4.0, Sodium142, GFR29-34 08/31/2017 Creatinine2.00, BUN27, Potassium4.6, Sodium142, LSL37-34  08/06/2017 Creatinine2.46, BUN40, Potassium4.7, [id number], WIO03-55 06/18/2017 Creatinine1.99, BUN36, Potassium4.5, Sodium139, HRC16-38  05/20/2017 Creatinine1.53, BUN28, Potassium5.0, Sodium140, GTX64-68  12/02/2016 Creatinine1.63, BUN24, Potassium4.7, [id number], NOI37-04 A complete set of results can be found in Results Review  Recommendations: No changes.Encouraged to call for fluid symptoms.  Follow-up plan: ICM clinic phone appointment on4/27/2020.  Copy of ICM check sent to Dr.Allred.  3 month ICM trend: 09/26/2018    1 Year ICM trend:       [name], RN 09/27/2018 12:40 PM

## 2018-10-06 ENCOUNTER — Telehealth: Payer: Self-pay | Admitting: Pharmacist

## 2018-10-06 ENCOUNTER — Telehealth: Payer: Self-pay

## 2018-10-06 NOTE — Telephone Encounter (Signed)

## 2018-10-06 NOTE — Telephone Encounter (Signed)
lmom for prescreen/drive thru 

## 2018-10-07 ENCOUNTER — Ambulatory Visit (INDEPENDENT_AMBULATORY_CARE_PROVIDER_SITE_OTHER): Payer: Medicare Other | Admitting: Pharmacist

## 2018-10-07 ENCOUNTER — Other Ambulatory Visit: Payer: Self-pay

## 2018-10-07 DIAGNOSIS — I483 Typical atrial flutter: Secondary | ICD-10-CM

## 2018-10-07 DIAGNOSIS — Z5181 Encounter for therapeutic drug level monitoring: Secondary | ICD-10-CM

## 2018-10-07 LAB — POCT INR: INR: 1.3 — AB (ref 2.0–3.0)

## 2018-10-31 ENCOUNTER — Ambulatory Visit (INDEPENDENT_AMBULATORY_CARE_PROVIDER_SITE_OTHER): Payer: Medicare Other

## 2018-10-31 ENCOUNTER — Other Ambulatory Visit: Payer: Self-pay

## 2018-10-31 DIAGNOSIS — I5022 Chronic systolic (congestive) heart failure: Secondary | ICD-10-CM

## 2018-10-31 DIAGNOSIS — Z95810 Presence of automatic (implantable) cardiac defibrillator: Secondary | ICD-10-CM | POA: Diagnosis not present

## 2018-11-01 ENCOUNTER — Other Ambulatory Visit (HOSPITAL_COMMUNITY): Payer: Self-pay | Admitting: Cardiology

## 2018-11-01 DIAGNOSIS — Z95810 Presence of automatic (implantable) cardiac defibrillator: Secondary | ICD-10-CM

## 2018-11-01 DIAGNOSIS — I5022 Chronic systolic (congestive) heart failure: Secondary | ICD-10-CM

## 2018-11-01 NOTE — Progress Notes (Signed)
EPIC Encounter for ICM Monitoring  Patient Name: Anthony Rubio. is a 68 y.o. male Date: 11/01/2018 Primary Care Physican: Vivi Barrack, MD Primary Cardiologist:McLean Electrophysiologist: Allred 09/28/2018 Weight: 228lbs 11/01/2018 Weight: 225-230 lbs   Heart Failure questions reviewed, pt asymptomatic.  Thoracic impedanceclose to baseline normal.  Prescribed:Furosemide to 40 mg 1 tablet every AM and 0.5 tablet(20 mg total)every PM.   Labs: 08/10/2018 Creatinine 1.75, BUN 32, Potassium 4.8, Sodium 140, GFR 47.01 01/03/2018 Creatinine 1.62, BUN 23, Potassium 4.5, Sodium 143, GFR 43-50 09/02/2017 Creatinine2.19, BUN29, Potassium4.0, Sodium142, GFR29-34 08/31/2017 Creatinine2.00, BUN27, Potassium4.6, Sodium142, NLG92-11  08/06/2017 Creatinine2.46, BUN40, Potassium4.7, HERDEY814, GYJ85-63 06/18/2017 Creatinine1.99, BUN36, Potassium4.5, Sodium139, JSH70-26  05/20/2017 Creatinine1.53, BUN28, Potassium5.0, Sodium140, VZC58-85  12/02/2016 Creatinine1.63, BUN24, Potassium4.7, [id number], OMV67-20 A complete set of results can be found in Results Review  Recommendations: No changes.Encouraged to call for fluid symptoms.  Follow-up plan: ICM clinic phone appointment on6/07/2018.  Copy of ICM check sent to Dr.Allred.   3 month ICM trend: 10/31/2018    1 Year ICM trend:       [name], RN 11/01/2018 10:28 AM

## 2018-11-03 ENCOUNTER — Telehealth: Payer: Self-pay

## 2018-11-03 NOTE — Telephone Encounter (Signed)
unanle to lmom voicemailbox is full

## 2018-11-04 ENCOUNTER — Ambulatory Visit (INDEPENDENT_AMBULATORY_CARE_PROVIDER_SITE_OTHER): Payer: Medicare Other | Admitting: Pharmacist

## 2018-11-04 ENCOUNTER — Other Ambulatory Visit: Payer: Self-pay

## 2018-11-04 DIAGNOSIS — Z5181 Encounter for therapeutic drug level monitoring: Secondary | ICD-10-CM | POA: Diagnosis not present

## 2018-11-04 DIAGNOSIS — I483 Typical atrial flutter: Secondary | ICD-10-CM | POA: Diagnosis not present

## 2018-11-04 LAB — POCT INR: INR: 2.5 (ref 2.0–3.0)

## 2018-11-30 ENCOUNTER — Other Ambulatory Visit: Payer: Self-pay | Admitting: Cardiology

## 2018-12-01 NOTE — Telephone Encounter (Signed)
Please schedule an appointment for patient. See below.

## 2018-12-01 NOTE — Telephone Encounter (Signed)
Warfarin refill sent for 30 days with a note to have pt call to schedule an appt with Dr. Aundra Dubin. Pt last saw him on 08/31/2017 and was due in 3 months post that visit. Also, pt saw Dr. Rayann Heman on 05/11/2018 and recall for 1 year, therefore able to send refill. Placed a note on CVRR appt to have pt call Dr. Claris Gladden office to schedule.

## 2018-12-05 ENCOUNTER — Ambulatory Visit (INDEPENDENT_AMBULATORY_CARE_PROVIDER_SITE_OTHER): Payer: Medicare Other

## 2018-12-05 DIAGNOSIS — I5022 Chronic systolic (congestive) heart failure: Secondary | ICD-10-CM | POA: Diagnosis not present

## 2018-12-05 DIAGNOSIS — Z95810 Presence of automatic (implantable) cardiac defibrillator: Secondary | ICD-10-CM | POA: Diagnosis not present

## 2018-12-05 NOTE — Telephone Encounter (Signed)
See below

## 2018-12-06 ENCOUNTER — Telehealth: Payer: Self-pay

## 2018-12-06 NOTE — Telephone Encounter (Signed)

## 2018-12-06 NOTE — Progress Notes (Signed)
EPIC Encounter for ICM Monitoring  Patient Name: Lamar Meter. is a 68 y.o. male Date: 12/06/2018 Primary Care Physican: Vivi Barrack, MD Primary Cardiologist:McLean Electrophysiologist: Allred 3/25/2020Weight: 228lbs 11/01/2018 Weight: 225-230 lbs   Heart Failure questions reviewed, pt asymptomatic.  Thoracic impedancenormal.  Prescribed:Furosemide to 40 mg 1 tablet every AM and 0.5 tablet(20 mg total)every PM.   Labs: 08/10/2018 Creatinine 1.75, BUN 32, Potassium 4.8, Sodium 140, GFR 47.01 01/03/2018 Creatinine 1.62, BUN 23, Potassium 4.5, Sodium 143, GFR 43-50 09/02/2017 Creatinine2.19, BUN29, Potassium4.0, Sodium142, GFR29-34 08/31/2017 Creatinine2.00, BUN27, Potassium4.6, Sodium142, MPN36-14  08/06/2017 Creatinine2.46, BUN40, Potassium4.7, ERXVQM086, PYP95-09 06/18/2017 Creatinine1.99, BUN36, Potassium4.5, Sodium139, TOI71-24  05/20/2017 Creatinine1.53, BUN28, Potassium5.0, Sodium140, PYK99-83  12/02/2016 Creatinine1.63, BUN24, Potassium4.7, JASNKN397, QBH41-93 A complete set of results can be found in Results Review  Recommendations: No changes.Encouraged to call for fluid symptoms.  Follow-up plan: ICM clinic phone appointment on7/12/2018.  Copy of ICM check sent to Dr.Allred.   3 month ICM trend: 12/05/2018    1 Year ICM trend:       [name], RN 12/06/2018 4:26 PM

## 2018-12-10 ENCOUNTER — Other Ambulatory Visit (HOSPITAL_COMMUNITY): Payer: Self-pay | Admitting: Cardiology

## 2018-12-17 ENCOUNTER — Other Ambulatory Visit (HOSPITAL_COMMUNITY): Payer: Self-pay | Admitting: Cardiology

## 2018-12-19 ENCOUNTER — Other Ambulatory Visit (HOSPITAL_COMMUNITY): Payer: Self-pay

## 2019-01-05 ENCOUNTER — Telehealth: Payer: Self-pay

## 2019-01-05 NOTE — Telephone Encounter (Signed)

## 2019-01-09 ENCOUNTER — Ambulatory Visit (INDEPENDENT_AMBULATORY_CARE_PROVIDER_SITE_OTHER): Payer: Medicare Other

## 2019-01-09 DIAGNOSIS — Z95810 Presence of automatic (implantable) cardiac defibrillator: Secondary | ICD-10-CM

## 2019-01-09 DIAGNOSIS — I5022 Chronic systolic (congestive) heart failure: Secondary | ICD-10-CM

## 2019-01-10 NOTE — Progress Notes (Signed)
EPIC Encounter for ICM Monitoring  Patient Name: Isak Sotomayor. is a 68 y.o. male Date: 01/10/2019 Primary Care Physican: Vivi Barrack, MD Primary Cardiologist:McLean Electrophysiologist: Allred 01/10/2019 Weight:225-230lbs   Heart Failure questions reviewed, pt asymptomatic.  He stated he is feeling fine.   Optivol thoracic impedancenormal.  Prescribed:Furosemide to 40 mg 1 tablet every AM and 0.5 tablet(20 mg total)every PM.   Labs: 08/10/2018 Creatinine 1.75, BUN 32, Potassium 4.8, Sodium 140, GFR 47.01 01/03/2018 Creatinine 1.62, BUN 23, Potassium 4.5, Sodium 143, GFR 43-50 A complete set of results can be found in Results Review  Recommendations: No changes.Encouraged to call for fluid symptoms.  Follow-up plan: ICM clinic phone appointment on8/04/2019.  Copy of ICM check sent to Dr.Allred.  3 month ICM trend: 01/09/2019    1 Year ICM trend:       [name], RN 01/10/2019 4:12 PM

## 2019-01-12 ENCOUNTER — Other Ambulatory Visit: Payer: Self-pay

## 2019-01-12 ENCOUNTER — Ambulatory Visit (INDEPENDENT_AMBULATORY_CARE_PROVIDER_SITE_OTHER): Payer: Medicare Other | Admitting: *Deleted

## 2019-01-12 DIAGNOSIS — Z5181 Encounter for therapeutic drug level monitoring: Secondary | ICD-10-CM | POA: Diagnosis not present

## 2019-01-12 DIAGNOSIS — I483 Typical atrial flutter: Secondary | ICD-10-CM | POA: Diagnosis not present

## 2019-01-12 LAB — POCT INR: INR: 4 — AB (ref 2.0–3.0)

## 2019-01-12 MED ORDER — WARFARIN SODIUM 5 MG PO TABS: ORAL_TABLET | ORAL | 0 refills | Status: DC

## 2019-01-12 NOTE — Patient Instructions (Signed)
Description    Hold tonight's dose, then continue same dose of Coumadin 1.5 tablets (7.5mg ) daily. Call if any questions or problems [phone]. Recheck INR 4 weeks.

## 2019-01-23 ENCOUNTER — Other Ambulatory Visit: Payer: Self-pay | Admitting: Family Medicine

## 2019-01-27 ENCOUNTER — Telehealth: Payer: Self-pay | Admitting: Family Medicine

## 2019-01-27 NOTE — Telephone Encounter (Signed)
Medication: glipiZIDE (GLUCOTROL XL) 10 MG 24 hr tablet [[id number]] 37 day supply Patient has made a appt for medication refill on 8/07/12/2018. His pharmacy has advised him that the medication is on sale half price at this time. The patient would like to take advantage of the sale. Please advise. Thank you  Has the patient contacted their pharmacy? Yes  (Agent: If no, request that the patient contact the pharmacy for the refill.) (Agent: If yes, when and what did the pharmacy advise?)  Preferred Pharmacy (with phone number or street name): [facility] [address], [place] [phone] (Phone) [phone] (Fax)    Agent: Please be advised that RX refills may take up to 3 business days. We ask that you follow-up with your pharmacy.

## 2019-01-27 NOTE — Telephone Encounter (Signed)
See note

## 2019-01-30 ENCOUNTER — Other Ambulatory Visit: Payer: Self-pay

## 2019-01-30 MED ORDER — GLIPIZIDE ER 10 MG PO TB24: 10.0000 mg | ORAL_TABLET | Freq: Every day | ORAL | 0 refills | Status: DC

## 2019-01-30 NOTE — Telephone Encounter (Signed)
Rx sent to pharmacy   

## 2019-02-10 ENCOUNTER — Ambulatory Visit (INDEPENDENT_AMBULATORY_CARE_PROVIDER_SITE_OTHER): Payer: Medicare Other | Admitting: *Deleted

## 2019-02-10 ENCOUNTER — Other Ambulatory Visit: Payer: Self-pay

## 2019-02-10 DIAGNOSIS — Z5181 Encounter for therapeutic drug level monitoring: Secondary | ICD-10-CM

## 2019-02-10 DIAGNOSIS — I483 Typical atrial flutter: Secondary | ICD-10-CM

## 2019-02-10 LAB — POCT INR: INR: 3.7 — AB (ref 2.0–3.0)

## 2019-02-10 NOTE — Patient Instructions (Addendum)
Description   Hold tonight's dose, then start taking Coumadin 1.5 tablets (7.5mg ) daily except 1 tablet on Tuesdays and Saturdays. Call if any questions or problems [phone]. Recheck INR 4 weeks per pt.

## 2019-02-13 ENCOUNTER — Ambulatory Visit (INDEPENDENT_AMBULATORY_CARE_PROVIDER_SITE_OTHER): Payer: Medicare Other

## 2019-02-13 DIAGNOSIS — I5022 Chronic systolic (congestive) heart failure: Secondary | ICD-10-CM | POA: Diagnosis not present

## 2019-02-13 DIAGNOSIS — Z95810 Presence of automatic (implantable) cardiac defibrillator: Secondary | ICD-10-CM

## 2019-02-14 NOTE — Progress Notes (Signed)
EPIC Encounter for ICM Monitoring  Patient Name: Anthony Rubio. is a 68 y.o. male Date: 02/14/2019 Primary Care Physican: Vivi Barrack, MD Primary Cardiologist:McLean Electrophysiologist: Allred 01/10/2019 Weight:225-230lbs   Heart Failure questions reviewed, pt asymptomatic.  He stated he is feeling fine.   Optivol thoracic impedancenormal.  Prescribed:Furosemide to 40 mg 1 tablet every AM and 0.5 tablet(20 mg total)every PM.   Labs: 08/10/2018 Creatinine 1.75, BUN 32, Potassium 4.8, Sodium 140, GFR 47.01 01/03/2018 Creatinine 1.62, BUN 23, Potassium 4.5, Sodium 143, GFR 43-50 A complete set of results can be found in Results Review  Recommendations: No changes.Encouraged to call for fluid symptoms.  Follow-up plan: ICM clinic phone appointment on10/12/2018.  Copy of ICM check sent to Dr.Allred.  3 month ICM trend: 02/13/2019    1 Year ICM trend:       [name], RN 02/14/2019 4:58 PM

## 2019-02-20 ENCOUNTER — Ambulatory Visit (INDEPENDENT_AMBULATORY_CARE_PROVIDER_SITE_OTHER): Payer: Medicare Other | Admitting: Family Medicine

## 2019-02-20 ENCOUNTER — Other Ambulatory Visit: Payer: Self-pay

## 2019-02-20 ENCOUNTER — Encounter: Payer: Self-pay | Admitting: Family Medicine

## 2019-02-20 VITALS — BP 122/78 | HR 62 | Temp 98.5°F | Ht 75.0 in | Wt 231.0 lb

## 2019-02-20 DIAGNOSIS — I2510 Atherosclerotic heart disease of native coronary artery without angina pectoris: Secondary | ICD-10-CM

## 2019-02-20 DIAGNOSIS — E785 Hyperlipidemia, unspecified: Secondary | ICD-10-CM | POA: Diagnosis not present

## 2019-02-20 DIAGNOSIS — E1159 Type 2 diabetes mellitus with other circulatory complications: Secondary | ICD-10-CM

## 2019-02-20 DIAGNOSIS — I2583 Coronary atherosclerosis due to lipid rich plaque: Secondary | ICD-10-CM | POA: Diagnosis not present

## 2019-02-20 DIAGNOSIS — E1169 Type 2 diabetes mellitus with other specified complication: Secondary | ICD-10-CM | POA: Diagnosis not present

## 2019-02-20 DIAGNOSIS — N183 Chronic kidney disease, stage 3 unspecified: Secondary | ICD-10-CM

## 2019-02-20 DIAGNOSIS — E1165 Type 2 diabetes mellitus with hyperglycemia: Secondary | ICD-10-CM | POA: Diagnosis not present

## 2019-02-20 DIAGNOSIS — I10 Essential (primary) hypertension: Secondary | ICD-10-CM | POA: Diagnosis not present

## 2019-02-20 DIAGNOSIS — I152 Hypertension secondary to endocrine disorders: Secondary | ICD-10-CM

## 2019-02-20 DIAGNOSIS — E1140 Type 2 diabetes mellitus with diabetic neuropathy, unspecified: Secondary | ICD-10-CM

## 2019-02-20 DIAGNOSIS — I5022 Chronic systolic (congestive) heart failure: Secondary | ICD-10-CM

## 2019-02-20 DIAGNOSIS — IMO0002 Reserved for concepts with insufficient information to code with codable children: Secondary | ICD-10-CM

## 2019-02-20 DIAGNOSIS — I483 Typical atrial flutter: Secondary | ICD-10-CM

## 2019-02-20 LAB — POCT GLYCOSYLATED HEMOGLOBIN (HGB A1C): Hemoglobin A1C: 8.1 % — AB (ref 4.0–5.6)

## 2019-02-20 MED ORDER — GLIPIZIDE ER 10 MG PO TB24: 10.0000 mg | ORAL_TABLET | Freq: Every day | ORAL | 3 refills | Status: DC

## 2019-02-20 NOTE — Assessment & Plan Note (Signed)
Regular rhythm today.  Continue management per cardiology. 

## 2019-02-20 NOTE — Assessment & Plan Note (Signed)
Continue statin. 

## 2019-02-20 NOTE — Assessment & Plan Note (Signed)
A1c 8.1.  Will increase glipizide to 20 mg for blood sugars over 200.  He will make dietary modifications-he is already done a good job with this over the last few weeks.  He will follow-up in 3 to 6 months.

## 2019-02-20 NOTE — Patient Instructions (Signed)
It was very nice to see you today!  Please take 2 tablets if your blood sugar is more than 200.  No other changes today.  Come back in 3-6 months or sooner if needed.   Take care, Dr Jerline Pain  Please try these tips to maintain a healthy lifestyle:   Eat at least 3 REAL meals and 1-2 snacks per day.  Aim for no more than 5 hours between eating.  If you eat breakfast, please do so within one hour of getting up.    Obtain twice as many fruits/vegetables as protein or carbohydrate foods for both lunch and dinner. (Half of each meal should be fruits/vegetables, one quarter protein, and one quarter starchy carbs)   Cut down on sweet beverages. This includes juice, soda, and sweet tea.    Exercise at least 150 minutes every week.

## 2019-02-20 NOTE — Progress Notes (Signed)
   Chief Complaint:  Anthony Rubio. is a 68 y.o. male who presents today with a chief complaint of T2DM follow up.   Assessment/Plan:  Chronic kidney disease, stage III (moderate) (HCC) GFR stable.   Chronic systolic heart failure Stable.  No signs of volume overload.  Continue management per cardiology.  Atrial flutter (HCC) Regular rhythm today.  Continue management per cardiology.  CAD (coronary artery disease) Continue statin.  Hypertension associated with diabetes ([place]) Stable.  Continue regimen per cardiology.  Type 2 diabetes, uncontrolled, with neuropathy (HCC) A1c 8.1.  Will increase glipizide to 20 mg for blood sugars over 200.  He will make dietary modifications-he is already done a good job with this over the last few weeks.  He will follow-up in 3 to 6 months.  Dyslipidemia associated with type 2 diabetes mellitus (HCC) Continue Lipitor 80 mg daily.    Subjective:  HPI:  His stable, chronic medical conditions are outlined below:  # T2DM  - On glipizide 10mg  daily and tolerating well - Home sugars typically in the upper 100s - ROS: No reported polyuria or polydipsia  # Dyslipidemia / Coronary Artery Disease  - On lipitor 80mg  daily - ROS: No myalgias  # GERD - On protonix 20mg  daily as needed  % CKD Stage 3  %  Essential Hypertension / Heart Failure with Reduced Ejection Fraction with ICD in place / Atrial Flutter - Follows with Cardiology - Anticoagulated on warfarin - On coreg 18.75mg  twice daily, imdur 15mg  daily, lasix 40mg  in the morning and 20mg  at night, and spironolactone 25mg  daily  ROS: Per HPI  PMH: He reports that he has been smoking cigarettes. He has a 8.25 pack-year smoking history. He has never used smokeless tobacco. He reports that he does not drink alcohol or use drugs.      Objective:  Physical Exam: BP 122/78 (BP Location: Left Arm, Patient Position: Sitting, Cuff Size: Normal)   Pulse 62   Temp 98.5 F (36.9 C) (Oral)    Ht 6\' 3"  (1.905 m)   Wt 231 lb (104.8 kg)   SpO2 98%   BMI 28.87 kg/m   Wt Readings from Last 3 Encounters:  02/20/19 231 lb (104.8 kg)  08/10/18 229 lb 6.1 oz (104 kg)  08/04/18 231 lb 6.4 oz (105 kg)  Gen: NAD, resting comfortably CV: Regular rate and rhythm with no murmurs appreciated Pulm: Normal work of breathing, clear to auscultation bilaterally with no crackles, wheezes, or rhonchi      Caleb M. Jerline Pain, MD 02/20/2019 8:50 AM

## 2019-02-20 NOTE — Assessment & Plan Note (Signed)
Continue Lipitor 80 mg daily.

## 2019-02-20 NOTE — Assessment & Plan Note (Signed)
Stable.  Continue regimen per cardiology.

## 2019-02-20 NOTE — Assessment & Plan Note (Signed)
Stable.  No signs of volume overload.  Continue management per cardiology. 

## 2019-02-20 NOTE — Assessment & Plan Note (Signed)
GFR stable ?

## 2019-02-22 ENCOUNTER — Other Ambulatory Visit (HOSPITAL_COMMUNITY): Payer: Self-pay | Admitting: Cardiology

## 2019-03-09 ENCOUNTER — Other Ambulatory Visit (HOSPITAL_COMMUNITY): Payer: Self-pay | Admitting: Cardiology

## 2019-03-10 ENCOUNTER — Other Ambulatory Visit: Payer: Self-pay

## 2019-03-10 ENCOUNTER — Ambulatory Visit (INDEPENDENT_AMBULATORY_CARE_PROVIDER_SITE_OTHER): Payer: Medicare Other | Admitting: *Deleted

## 2019-03-10 DIAGNOSIS — Z5181 Encounter for therapeutic drug level monitoring: Secondary | ICD-10-CM

## 2019-03-10 DIAGNOSIS — I483 Typical atrial flutter: Secondary | ICD-10-CM | POA: Diagnosis not present

## 2019-03-10 LAB — POCT INR: INR: 2.3 (ref 2.0–3.0)

## 2019-03-10 NOTE — Patient Instructions (Signed)
Description   Continue taking the dose you have been taking which is 1.5 tablets (7.5mg ) daily except 1 tablet on Mondays. Call if any questions or problems [phone]. Recheck INR 4 weeks.

## 2019-04-01 DIAGNOSIS — Z23 Encounter for immunization: Secondary | ICD-10-CM | POA: Diagnosis not present

## 2019-04-04 ENCOUNTER — Ambulatory Visit (INDEPENDENT_AMBULATORY_CARE_PROVIDER_SITE_OTHER): Payer: Medicare Other | Admitting: *Deleted

## 2019-04-04 ENCOUNTER — Other Ambulatory Visit: Payer: Self-pay

## 2019-04-04 DIAGNOSIS — Z5181 Encounter for therapeutic drug level monitoring: Secondary | ICD-10-CM | POA: Diagnosis not present

## 2019-04-04 DIAGNOSIS — I483 Typical atrial flutter: Secondary | ICD-10-CM

## 2019-04-04 LAB — POCT INR: INR: 2.8 (ref 2.0–3.0)

## 2019-04-04 MED ORDER — WARFARIN SODIUM 5 MG PO TABS: ORAL_TABLET | ORAL | 0 refills | Status: DC

## 2019-04-04 NOTE — Patient Instructions (Addendum)
Description   Continue taking the dose you have been taking which is 1.5 tablets (7.5mg ) daily except 1 tablet on Mondays. Call if any questions or problems [phone]. Recheck INR 6 weeks- per pt request

## 2019-04-11 ENCOUNTER — Ambulatory Visit (INDEPENDENT_AMBULATORY_CARE_PROVIDER_SITE_OTHER): Payer: Medicare Other

## 2019-04-11 DIAGNOSIS — I5022 Chronic systolic (congestive) heart failure: Secondary | ICD-10-CM | POA: Diagnosis not present

## 2019-04-11 DIAGNOSIS — Z95810 Presence of automatic (implantable) cardiac defibrillator: Secondary | ICD-10-CM | POA: Diagnosis not present

## 2019-04-12 NOTE — Progress Notes (Signed)
EPIC Encounter for ICM Monitoring  Patient Name: Anthony Rubio. is a 68 y.o. male Date: 04/12/2019 Primary Care Physican: Vivi Barrack, MD Primary Cardiologist:McLean Electrophysiologist: Allred 04/12/2019 Weight:225lbs   Heart Failure questions reviewed, pt asymptomatic.He stated he is feeling fine.  Optivol thoracic impedancenormal.  Prescribed:Furosemide to 40 mg 1 tablet every AM and 0.5 tablet(20 mg total)every PM.   Labs: 08/10/2018 Creatinine 1.75, BUN 32, Potassium 4.8, Sodium 140, GFR 47.01 01/03/2018 Creatinine 1.62, BUN 23, Potassium 4.5, Sodium 143, GFR 43-50 A complete set of results can be found in Results Review  Recommendations:  No changes and encouraged to call if experiencing any fluid symptoms.  Follow-up plan: ICM clinic phone appointment on 06/19/2019.  Office appt 05/16/2019 with Barrington Ellison, PA.    Copy of ICM check sent to Dr. Rayann Heman.   3 month ICM trend: 04/11/2019    1 Year ICM trend:       [name], RN 04/12/2019 4:48 PM

## 2019-04-26 ENCOUNTER — Telehealth: Payer: Self-pay

## 2019-04-26 NOTE — Telephone Encounter (Signed)
I called and left patient a message about moving appointment on 05/16/19 with Anthony Rubio to tomorrow or Friday.

## 2019-05-01 ENCOUNTER — Other Ambulatory Visit (HOSPITAL_COMMUNITY): Payer: Self-pay | Admitting: Cardiology

## 2019-05-01 DIAGNOSIS — Z95810 Presence of automatic (implantable) cardiac defibrillator: Secondary | ICD-10-CM

## 2019-05-01 DIAGNOSIS — I5022 Chronic systolic (congestive) heart failure: Secondary | ICD-10-CM

## 2019-05-14 NOTE — Progress Notes (Signed)
Electrophysiology Office Note Date: 05/15/2019  ID:  Anthony Rubio., DOB Jun 09, 1951, MRN [id number]  PCP: Anthony Barrack, MD Primary Cardiologist: No primary care provider on file. Electrophysiologist: Anthony Grayer, MD  CC: Routine ICD follow-up  Anthony Rubio. is a 68 y.o. male seen today for Anthony Rubio.  They present today for routine electrophysiology followup.  Since last being seen in our clinic, the patient reports doing very well. They deny chest pain, palpitations, dyspnea, PND, orthopnea, nausea, vomiting, dizziness, syncope, edema, weight gain, or early satiety.  He has not had ICD shocks.   Device History: Medtronic Dual Chamber ICD implanted 05/2010, with gen change 01/2018 for ERI for chronic systolic CHF History of appropriate therapy: No History of AAD therapy: No   Past Medical History:  Diagnosis Date  . AICD (automatic cardioverter/defibrillator) present   . Aortic stenosis    a. Mild by echo 04/2011.   . Arthritis   . Atrial flutter ([place])    s/p DCCV 9/11  . CAD (coronary artery disease)    a. LHC (5/11) with 50-60% mid LAD, diffuse mod distal LAD up to 60-70%, small OM2 subtotally occ, mod-severe diffuse distal PLOM, severe diffuse distal RCA, PDA, and PLV  disease. Started on Plavix given concern for thrombus formation in the ectatic cors with slow flow. b. Stopped Plavix after starting Coumadin for a-flutter.  . CHF (congestive heart failure) ([place])    a. Likely mixed ICM/NICM. Echo (5/11) with EF 15-20%, severe global HK, mild MR, mod d/d, mildly decreased RV fcn. RHC (5/11) with mean RA 14 mmHg, PA 38/26, mean PCWP 25 mmHg, CI 1.8.  b. TEE (9/11): EF 15% no LV thrombus, no LAA thrombus. c. s/p ICD 11/11. d. Last echo 04/2011: LV severely dilated, EF 25%, diffuse HK, mild AS, mildly dilated LA, trivial pericardial effusion.  . CKD (chronic kidney disease), stage III ([place])   . Complication of anesthesia    Slow to awaken  . Diabetic neuropathy (HCC)    severe; with chronic gait dysfunction  . Dysrhythmia   . Foot ulcer, left ([place])    poor healing  . GERD (gastroesophageal reflux disease)    H.pyloric  . History of echocardiogram 07/2015   Echo 1/17: EF 20-25%, inf-lat AK, restrictive physio, mild AS (mean 9 mmHg), mild AI, mod MR, severe LAE, mod reduced RVSF, mild RAE, mild TR, PASP 50 mmHg  . HTN (hypertension)   . ICD (implantable cardiac defibrillator) in place 11/11   Medtronic dual cha,ber ICD with Optivol  . Neuropathy associated with endocrine disorder (HCC)    feet anad finger tips  . Tobacco abuse   . Uncontrolled type II diabetes mellitus ([place])    Past Surgical History:  Procedure Laterality Date  . CARDIOVERSION N/A 08/23/2017   Procedure: CARDIOVERSION;  Surgeon: Anthony Dresser, MD;  Location: [facility] ENDOSCOPY;  Service: Cardiovascular;  Laterality: N/A;  . COLONOSCOPY    . COLONOSCOPY WITH PROPOFOL N/A 12/01/2017   Procedure: COLONOSCOPY WITH PROPOFOL;  Surgeon: Anthony Silence, MD;  Location: [facility];  Service: Endoscopy;  Laterality: N/A;  . ESOPHAGOGASTRODUODENOSCOPY    . ICD GENERATOR CHANGEOUT N/A 02/01/2018   Procedure: ICD GENERATOR CHANGEOUT;  Surgeon: Anthony Grayer, MD;  Location: [facility] CV LAB;  Service: Cardiovascular;  Laterality: N/A;  . ICD implantation  2011   by Anthony Rubio  . TEE WITHOUT CARDIOVERSION N/A 08/23/2017   Procedure: TRANSESOPHAGEAL ECHOCARDIOGRAM (TEE);  Surgeon: Anthony Dresser, MD;  Location[facility]  ENDOSCOPY;  Service: Cardiovascular;  Laterality: N/A;    Current Outpatient Medications  Medication Sig Dispense Refill  . atorvastatin (LIPITOR) 80 MG tablet Take 1 tablet (80 mg total) by mouth daily at 6 PM. 30 tablet 3  . carvedilol (COREG) 6.25 MG tablet TAKE 3 TABLETS BY MOUTH TWICE DAILY. NEED OFFICE VISIT 180 tablet 0  . cyclobenzaprine (FLEXERIL) 10 MG tablet Take 1 tablet (10 mg total) by mouth 3 (three) times daily as needed for muscle spasms. 30 tablet 0  . furosemide (LASIX) 40 MG  tablet TAKE 1 TABLET BY MOUTH IN THE MORNING AND 1/2 (ONE-HALF) TABLET IN THE EVENING 135 tablet 0  . glipiZIDE (GLUCOTROL XL) 10 MG 24 hr tablet Take 1-2 tablets (10-20 mg total) by mouth daily with breakfast. Take 2 tablets if blood sugar > 200. 180 tablet 3  . isosorbide mononitrate (IMDUR) 30 MG 24 hr tablet TAKE 1/2 (ONE-HALF) TABLET BY MOUTH ONCE DAILY. STOP BIDIL 45 tablet 1  . Lancets (ACCU-CHEK SOFT TOUCH) lancets Use to test blood sugar once a day E11.9 100 each 3  . pantoprazole (PROTONIX) 40 MG tablet Take 20 mg by mouth daily as needed (heartburn).     Marland Kitchen spironolactone (ALDACTONE) 25 MG tablet TAKE 1 TABLET BY MOUTH ONCE DAILY 90 tablet 2  . warfarin (COUMADIN) 5 MG tablet TAKE AS DIRECTED BY  COUMADIN  CLINIC 50 tablet 0   No current facility-administered medications for this visit.     Allergies:   Erythromycin, Hydralazine, Lisinopril, and Losartan potassium   Social History: Social History   Socioeconomic History  . Marital status: Married    Spouse name: Not on file  . Number of children: Not on file  . Years of education: Not on file  . Highest education level: Not on file  Occupational History  . Occupation: Retired Barrister's clerk  . Occupation: Former [employer]  . Financial resource strain: Not on file  . Food insecurity    Worry: Not on file    Inability: Not on file  . Transportation needs    Medical: Not on file    Non-medical: Not on file  Tobacco Use  . Smoking status: Current Some Day Smoker    Packs/day: 0.25    Years: 33.00    Pack years: 8.25    Types: Cigarettes  . Smokeless tobacco: Never Used  . Tobacco comment: about 1/4 ppd. Married, lives with wife. Pt lives in [employer] guard. Retired Lawyer and Sexual Activity  . Alcohol use: No  . Drug use: No  . Sexual activity: Not Currently  Lifestyle  . Physical activity    Days per week: Not on file    Minutes per session: Not on file  . Stress: Not on  file  Relationships  . Social Herbalist on phone: Not on file    Gets together: Not on file    Attends religious service: Not on file    Active member of club or organization: Not on file    Attends meetings of clubs or organizations: Not on file    Relationship status: Not on file  . Intimate partner violence    Fear of current or ex partner: Not on file    Emotionally abused: Not on file    Physically abused: Not on file    Forced sexual activity: Not on file  Other Topics Concern  . Not on file  Social History  Narrative  . Not on file    Family History: Family History  Problem Relation Age of Onset  . Stroke Mother   . Early death Father   . Congestive Heart Failure Maternal Grandmother   . Gout Maternal Grandfather   . Diabetes Paternal Grandfather   . Cancer Paternal Grandfather   . Heart attack Son   . Early death Son     Review of Systems: All other systems reviewed and are otherwise negative except as noted above.   Physical Exam: Vitals:   05/15/19 0820  BP: 116/84  Pulse: 68  Weight: 232 lb (105.2 kg)  Height: 6\' 3"  (1.905 m)     GEN- The patient is well appearing, alert and oriented x 3 today.   HEENT: normocephalic, atraumatic; sclera clear, conjunctiva pink; hearing intact; oropharynx clear; neck supple, no JVP Lymph- no cervical lymphadenopathy Lungs- Clear to ausculation bilaterally, normal work of breathing.  No wheezes, rales, rhonchi Heart- Regular rate and rhythm, no murmurs, rubs or gallops, PMI not laterally displaced GI- soft, non-tender, non-distended, bowel sounds present, no hepatosplenomegaly Extremities- no clubbing, cyanosis, or edema; DP/PT/radial pulses 2+ bilaterally MS- no significant deformity or atrophy Skin- warm and dry, no rash or lesion; ICD pocket well healed Psych- euthymic mood, full affect Neuro- strength and sensation are intact  ICD interrogation- reviewed in detail today,  See PACEART report  EKG:  EKG  is ordered today. The ekg ordered today shows NSR at 68 bpm with PACs  Recent Labs: 08/10/2018: ALT 17; BUN 32; Creatinine, Ser 1.75; Hemoglobin 14.8; Platelets 166.0; Potassium 4.8; Sodium 140; TSH 1.81   Wt Readings from Last 3 Encounters:  05/15/19 232 lb (105.2 kg)  02/20/19 231 lb (104.8 kg)  08/10/18 229 lb 6.1 oz (104 kg)     Other studies Reviewed: Additional studies/ records that were reviewed today include: TEE 08/2017 showed LVEF 25%   Assessment and Plan:  1.  Chronic systolic dysfunction s/p Medtronic dual chamber ICD  euvolemic today Stable on an appropriate medical regimen Normal ICD function See Pace Art report No changes today BAROSTIM discussed at length. Pt is not interested in proceeding with work up at this time, but may be in the future. Given patient materials today.   2. HTN Stable on current regiment  3. CAD Denies ischemic symptoms  4. Atrial flutter/atrial fibrillation Pt had hour long episode in August, none since. Continue to follow.   Continue coumadin for CHA2DS2VASC of 5    Current medicines are reviewed at length with the patient today.   The patient does not have concerns regarding his medicines.  The following changes were made today:  None  Labs/ tests ordered today include:  Orders Placed This Encounter  Procedures  . Basic metabolic panel  . Pro b natriuretic peptide  . HEART 12-Lead    Disposition:   Follow up with Anthony Rubio in 1 year. Sooner with symptoms, or if decides to consider barostim.   Jacalyn Lefevre, PA-C  05/15/2019 9:19 AM  [place] [id number] [phone] (office) [phone] (fax)

## 2019-05-15 ENCOUNTER — Ambulatory Visit (INDEPENDENT_AMBULATORY_CARE_PROVIDER_SITE_OTHER): Payer: Medicare Other | Admitting: *Deleted

## 2019-05-15 ENCOUNTER — Other Ambulatory Visit: Payer: Self-pay

## 2019-05-15 ENCOUNTER — Ambulatory Visit (INDEPENDENT_AMBULATORY_CARE_PROVIDER_SITE_OTHER): Payer: Medicare Other | Admitting: Student

## 2019-05-15 VITALS — BP 116/84 | HR 68 | Ht 75.0 in | Wt 232.0 lb

## 2019-05-15 DIAGNOSIS — Z5181 Encounter for therapeutic drug level monitoring: Secondary | ICD-10-CM

## 2019-05-15 DIAGNOSIS — I5022 Chronic systolic (congestive) heart failure: Secondary | ICD-10-CM

## 2019-05-15 DIAGNOSIS — I483 Typical atrial flutter: Secondary | ICD-10-CM

## 2019-05-15 DIAGNOSIS — I2583 Coronary atherosclerosis due to lipid rich plaque: Secondary | ICD-10-CM

## 2019-05-15 DIAGNOSIS — I2510 Atherosclerotic heart disease of native coronary artery without angina pectoris: Secondary | ICD-10-CM | POA: Diagnosis not present

## 2019-05-15 LAB — CUP PACEART INCLINIC DEVICE CHECK
Date Time Interrogation Session: 20201109092957
Implantable Lead Implant Date: 20111108
Implantable Lead Implant Date: 20111108
Implantable Lead Location: 753859
Implantable Lead Location: 753860
Implantable Lead Model: 5076
Implantable Lead Model: 6947
Implantable Pulse Generator Implant Date: 20190730

## 2019-05-15 LAB — BASIC METABOLIC PANEL
BUN/Creatinine Ratio: 19 (ref 10–24)
BUN: 37 mg/dL — ABNORMAL HIGH (ref 8–27)
CO2: 21 mmol/L (ref 20–29)
Calcium: 9.7 mg/dL (ref 8.6–10.2)
Chloride: 102 mmol/L (ref 96–106)
Creatinine, Ser: 1.95 mg/dL — ABNORMAL HIGH (ref 0.76–1.27)
GFR calc Af Amer: 39 mL/min/{1.73_m2} — ABNORMAL LOW (ref >59)
GFR calc non Af Amer: 34 mL/min/{1.73_m2} — ABNORMAL LOW (ref >59)
Glucose: 146 mg/dL — ABNORMAL HIGH (ref 65–99)
Potassium: 4.9 mmol/L (ref 3.5–5.2)
Sodium: 140 mmol/L (ref 134–144)

## 2019-05-15 LAB — POCT INR: INR: 2.6 (ref 2.0–3.0)

## 2019-05-15 LAB — PRO B NATRIURETIC PEPTIDE: NT-Pro BNP: 6622 pg/mL — ABNORMAL HIGH (ref 0–376)

## 2019-05-15 NOTE — Patient Instructions (Signed)
Description   Continue taking the dose you have been taking which is 1.5 tablets (7.5mg ) daily except 1 tablet on Mondays. Call if any questions or problems [phone]. Recheck INR 6 weeks.

## 2019-05-15 NOTE — Patient Instructions (Signed)
Medication Instructions:  Your physician recommends that you continue on your current medications as directed. Please refer to the Current Medication list given to you today.  *If you need a refill on your cardiac medications before your next appointment, please call your pharmacy*  Lab Work: BMET AND NT PRO B   If you have labs (blood work) drawn today and your tests are completely normal, you will receive your results only by: Marland Kitchen MyChart Message (if you have MyChart) OR . A paper copy in the mail If you have any lab test that is abnormal or we need to change your treatment, we will call you to review the results.  Testing/Procedures: NONE ORDERED  TODAY    Follow-Up: At [facility], you and your health needs are our priority.  As part of our continuing mission to provide you with exceptional heart care, we have created designated Provider Care Teams.  These Care Teams include your primary Cardiologist (physician) and Advanced Practice Providers (APPs -  Physician Assistants and Nurse Practitioners) who all work together to provide you with the care you need, when you need it.  Your next appointment:   12 months  The format for your next appointment:   In Person  Provider:   You may see Thompson Grayer, MD or one of the following Advanced Practice Providers on your designated Care Team:    Chanetta Marshall, NP  Tommye Standard, PA-C  [name] "Oda Kilts, [place]   Other Instructions

## 2019-05-16 ENCOUNTER — Encounter: Payer: Medicare Other | Admitting: Student

## 2019-05-20 ENCOUNTER — Other Ambulatory Visit (HOSPITAL_COMMUNITY): Payer: Self-pay | Admitting: Cardiology

## 2019-05-20 ENCOUNTER — Other Ambulatory Visit: Payer: Self-pay | Admitting: Internal Medicine

## 2019-05-22 ENCOUNTER — Other Ambulatory Visit: Payer: Self-pay | Admitting: Internal Medicine

## 2019-05-22 ENCOUNTER — Other Ambulatory Visit: Payer: Self-pay | Admitting: Pharmacist

## 2019-05-22 MED ORDER — WARFARIN SODIUM 5 MG PO TABS: ORAL_TABLET | ORAL | 0 refills | Status: DC

## 2019-05-22 MED ORDER — SPIRONOLACTONE 25 MG PO TABS: 25.0000 mg | ORAL_TABLET | Freq: Every day | ORAL | 3 refills | Status: DC

## 2019-06-19 ENCOUNTER — Ambulatory Visit (INDEPENDENT_AMBULATORY_CARE_PROVIDER_SITE_OTHER): Payer: Medicare Other

## 2019-06-19 DIAGNOSIS — Z95810 Presence of automatic (implantable) cardiac defibrillator: Secondary | ICD-10-CM

## 2019-06-19 DIAGNOSIS — I5022 Chronic systolic (congestive) heart failure: Secondary | ICD-10-CM

## 2019-06-21 ENCOUNTER — Telehealth: Payer: Self-pay

## 2019-06-21 NOTE — Telephone Encounter (Signed)
Left message for patient to remind of missed remote transmission. 12/15 

## 2019-06-23 ENCOUNTER — Telehealth: Payer: Self-pay

## 2019-06-23 NOTE — Telephone Encounter (Signed)
Remote ICM transmission received.  Attempted call to patient regarding ICM remote transmission and left detailed message per DPR.  Advised to return call for any fluid symptoms or questions. Next ICM remote transmission scheduled 07/31/2019.

## 2019-06-23 NOTE — Progress Notes (Signed)
EPIC Encounter for ICM Monitoring  Patient Name: Ilan Kehres. is a 68 y.o. male Date: 06/23/2019 Primary Care Physican: Vivi Barrack, MD Primary Cardiologist:[facility] Electrophysiologist: Allred 04/12/2019 Weight:225lbs   Attempted call to patient and unable to reach.  Left detailed message per DPR regarding transmission. Transmission reviewed.   Optivol thoracic impedancenormal.  Prescribed:Furosemide to 40 mg 1 tablet every AM and 0.5 tablet(20 mg total)every PM.   Labs: 05/15/2019 Creatinine 1.95, BUN 37, Potassium 4.9, Sodium 140, GFR 34-39 08/10/2018 Creatinine 1.75, BUN 32, Potassium 4.8, Sodium 140, GFR 47.01 01/03/2018 Creatinine 1.62, BUN 23, Potassium 4.5, Sodium 143, GFR 43-50 A complete set of results can be found in Results Review  Recommendations:  Left voice mail with ICM number and encouraged to call if experiencing any fluid symptoms.  Follow-up plan: ICM clinic phone appointment on 07/31/2019.  Office appt 05/16/2019 with Barrington Ellison, PA.    Copy of ICM check sent to Dr. Rayann Heman.   3 month ICM trend: 06/21/2019    1 Year ICM trend:       [name], RN 06/23/2019 5:08 PM

## 2019-06-26 ENCOUNTER — Other Ambulatory Visit: Payer: Self-pay

## 2019-06-26 ENCOUNTER — Ambulatory Visit (INDEPENDENT_AMBULATORY_CARE_PROVIDER_SITE_OTHER): Payer: Medicare Other | Admitting: *Deleted

## 2019-06-26 DIAGNOSIS — I483 Typical atrial flutter: Secondary | ICD-10-CM | POA: Diagnosis not present

## 2019-06-26 DIAGNOSIS — Z5181 Encounter for therapeutic drug level monitoring: Secondary | ICD-10-CM | POA: Diagnosis not present

## 2019-06-26 LAB — POCT INR: INR: 2.1 (ref 2.0–3.0)

## 2019-06-26 NOTE — Patient Instructions (Addendum)
  Description   Continue taking the dose you have been taking which is 1.5 tablets (7.5mg ) daily except 1 tablet on Mondays. Call if any questions or problems [phone]. Recheck INR 7 weeks.

## 2019-07-31 ENCOUNTER — Ambulatory Visit (INDEPENDENT_AMBULATORY_CARE_PROVIDER_SITE_OTHER): Payer: Medicare Other

## 2019-07-31 DIAGNOSIS — I5022 Chronic systolic (congestive) heart failure: Secondary | ICD-10-CM

## 2019-07-31 DIAGNOSIS — Z95810 Presence of automatic (implantable) cardiac defibrillator: Secondary | ICD-10-CM

## 2019-08-01 ENCOUNTER — Telehealth: Payer: Self-pay

## 2019-08-01 NOTE — Telephone Encounter (Signed)
Remote ICM transmission received.  Attempted call to patient regarding ICM remote transmission and left detailed message per DPR to return call.  Advised to return call for any fluid symptoms or questions.      

## 2019-08-01 NOTE — Progress Notes (Signed)
EPIC Encounter for ICM Monitoring  Patient Name: Anthony Rubio. is a 69 y.o. male Date: 08/01/2019 Primary Care Physican: Vivi Barrack, MD Primary Cardiologist:[facility] Electrophysiologist: Allred 10/7/2020Weight:225lbs   Attempted call to patient and unable to reach.  Left detailed message per DPR regarding transmission. Transmission reviewed.   Optivol thoracic impedancesuggesting possible fluid accumulation since 07/24/2019.  Prescribed:Furosemide to 40 mg 1 tablet every AM and 0.5 tablet(20 mg total)every PM.   Labs: 05/15/2019 Creatinine 1.95, BUN 37, Potassium 4.9, Sodium 140, GFR 34-39 08/10/2018 Creatinine 1.75, BUN 32, Potassium 4.8, Sodium 140, GFR 47.01 01/03/2018 Creatinine 1.62, BUN 23, Potassium 4.5, Sodium 143, GFR 43-50 A complete set of results can be found in Results Review  Recommendations: Left voice mail with ICM number and encouraged to call if experiencing any fluid symptoms.  Follow-up plan: ICM clinic phone appointment on 08/07/2019 to recheck fluid levels.   91 day device clinic remote transmission 08/07/2019.     Copy of ICM check sent to Dr. Rayann Heman and Dr Aundra Dubin for review and recommendations if needed.   3 month ICM trend: 07/31/2019    1 Year ICM trend:       [name], RN 08/01/2019 12:04 PM

## 2019-08-02 NOTE — Progress Notes (Signed)
Increase Lasix to 40 mg bid x 3 days then back to 40 qam/20 qpm.

## 2019-08-04 NOTE — Progress Notes (Signed)
Spoke with patient. Advised updated remote transmission continues to show possible fluid accumulation. He has not had any diarrhea since 08/03/2019 and GI system feels fine today.  He thinks he has been eating the wrong foods that may be causing fluid accumulation and upset GI system.   Advised Dr Aundra Dubin recommended he take Lasix 40 mg twice a day x 3 days and then return to 40 mg every morning and 20 mg every evening.  He verbalized understanding and will recheck fluid levels on 08/07/2019.

## 2019-08-04 NOTE — Progress Notes (Signed)
Attempted call to patient.  Left message to return call.

## 2019-08-04 NOTE — Progress Notes (Signed)
Spoke with patient and transmission reviewed. He reported having diarrhea this week and thinks the fluid accumulation may have resolved.  Advised to send updated remote transmission today for review.

## 2019-08-07 ENCOUNTER — Ambulatory Visit (INDEPENDENT_AMBULATORY_CARE_PROVIDER_SITE_OTHER): Payer: Medicare Other | Admitting: *Deleted

## 2019-08-07 ENCOUNTER — Telehealth: Payer: Self-pay | Admitting: Family Medicine

## 2019-08-07 DIAGNOSIS — Z95810 Presence of automatic (implantable) cardiac defibrillator: Secondary | ICD-10-CM

## 2019-08-07 NOTE — Telephone Encounter (Signed)
Left message for patient to call back and schedule Medicare Annual Wellness Visit (AWV) either virtually/audio only OR in office. Whatever the patients preference is.  Last AWV 1.30.20; please schedule at anytime with [facility]Nurse Health Advisor at [facility].

## 2019-08-08 ENCOUNTER — Ambulatory Visit (INDEPENDENT_AMBULATORY_CARE_PROVIDER_SITE_OTHER): Payer: Medicare Other

## 2019-08-08 DIAGNOSIS — I5022 Chronic systolic (congestive) heart failure: Secondary | ICD-10-CM

## 2019-08-08 DIAGNOSIS — Z95810 Presence of automatic (implantable) cardiac defibrillator: Secondary | ICD-10-CM

## 2019-08-08 LAB — CUP PACEART REMOTE DEVICE CHECK
Battery Remaining Longevity: 111 mo
Battery Voltage: 3.01 V
Brady Statistic AP VP Percent: 0.3 %
Brady Statistic AP VS Percent: 68.09 %
Brady Statistic AS VP Percent: 0.04 %
Brady Statistic AS VS Percent: 31.57 %
Brady Statistic RA Percent Paced: 67.7 %
Brady Statistic RV Percent Paced: 0.37 %
Date Time Interrogation Session: 20210201183425
HighPow Impedance: 46 Ohm
HighPow Impedance: 54 Ohm
Implantable Lead Implant Date: 20111108
Implantable Lead Implant Date: 20111108
Implantable Lead Location: 753859
Implantable Lead Location: 753860
Implantable Lead Model: 5076
Implantable Lead Model: 6947
Implantable Pulse Generator Implant Date: 20190730
Lead Channel Impedance Value: 285 Ohm
Lead Channel Impedance Value: 361 Ohm
Lead Channel Impedance Value: 513 Ohm
Lead Channel Pacing Threshold Amplitude: 0.75 V
Lead Channel Pacing Threshold Amplitude: 1 V
Lead Channel Pacing Threshold Pulse Width: 0.4 ms
Lead Channel Pacing Threshold Pulse Width: 0.4 ms
Lead Channel Sensing Intrinsic Amplitude: 2.5 mV
Lead Channel Sensing Intrinsic Amplitude: 2.5 mV
Lead Channel Sensing Intrinsic Amplitude: 6 mV
Lead Channel Sensing Intrinsic Amplitude: 6 mV
Lead Channel Setting Pacing Amplitude: 2 V
Lead Channel Setting Pacing Amplitude: 2.5 V
Lead Channel Setting Pacing Pulse Width: 0.4 ms
Lead Channel Setting Sensing Sensitivity: 0.3 mV

## 2019-08-09 NOTE — Progress Notes (Signed)
EPIC Encounter for ICM Monitoring  Patient Name: Anthony Rubio. is a 69 y.o. male Date: 08/09/2019 Primary Care Physican: Vivi Barrack, MD Primary Cardiologist:[facility] Electrophysiologist: Allred 08/09/2019 Weight:227lbs   Spoke with patient and reports feeling well at this time.  Denies fluid symptoms.    Optivol thoracic impedancereturned to normal after taking extra Furosemide tablet.  Prescribed:Furosemide to 40 mg 1 tablet every AM and 0.5 tablet(20 mg total)every PM.   Labs: 05/15/2019 Creatinine 1.95, BUN 37, Potassium 4.9, Sodium 140, GFR 34-39 08/10/2018 Creatinine 1.75, BUN 32, Potassium 4.8, Sodium 140, GFR 47.01 01/03/2018 Creatinine 1.62, BUN 23, Potassium 4.5, Sodium 143, GFR 43-50 A complete set of results can be found in Results Review  Recommendations: No changes and encouraged to call if experiencing any fluid symptoms.  Follow-up plan: ICM clinic phone appointment on 09/04/2019 to recheck fluid levels.   91 day device clinic remote transmission 11/06/2019.     Copy of ICM check sent to Dr. Rayann Heman   3 month ICM trend: 08/07/2019    1 Year ICM trend:       [name], RN 08/09/2019 4:58 PM

## 2019-08-15 ENCOUNTER — Ambulatory Visit (INDEPENDENT_AMBULATORY_CARE_PROVIDER_SITE_OTHER): Payer: Medicare Other | Admitting: *Deleted

## 2019-08-15 ENCOUNTER — Other Ambulatory Visit: Payer: Self-pay

## 2019-08-15 DIAGNOSIS — Z5181 Encounter for therapeutic drug level monitoring: Secondary | ICD-10-CM | POA: Diagnosis not present

## 2019-08-15 DIAGNOSIS — I483 Typical atrial flutter: Secondary | ICD-10-CM

## 2019-08-15 LAB — POCT INR: INR: 3.6 — AB (ref 2.0–3.0)

## 2019-08-15 NOTE — Patient Instructions (Signed)
Description   Hold today, and continue taking 1.5 tablets (7.5mg ) daily except 1 tablet on Mondays. Call for any questions or problems [phone]. Recheck INR 4 weeks- per pt request.

## 2019-08-16 ENCOUNTER — Ambulatory Visit: Payer: Medicare Other

## 2019-08-21 ENCOUNTER — Other Ambulatory Visit (HOSPITAL_COMMUNITY): Payer: Self-pay | Admitting: Cardiology

## 2019-09-01 ENCOUNTER — Ambulatory Visit: Payer: Medicare Other | Attending: Internal Medicine

## 2019-09-01 DIAGNOSIS — Z23 Encounter for immunization: Secondary | ICD-10-CM

## 2019-09-01 NOTE — Progress Notes (Signed)
   [id number]  Name:  Jaad Boilard.    MRN: [id number] DOB: [birth year]  09/01/2019  Mr. Mahurin was observed post Covid-19 immunization for 15 minutes without incidence. He was provided with Vaccine Information Sheet and instruction to access the V-Safe system.   Mr. Choy was instructed to call 911 with any severe reactions post vaccine: Marland Kitchen Difficulty breathing  . Swelling of your face and throat  . A fast heartbeat  . A bad rash all over your body  . Dizziness and weakness    Immunizations Administered    Name Date Dose VIS Date Route   Pfizer COVID-19 Vaccine 09/01/2019  3:17 PM 0.3 mL 06/16/2019 Intramuscular   Manufacturer: [place]   Lot: [id number]   [place]: [id number]

## 2019-09-04 ENCOUNTER — Ambulatory Visit (INDEPENDENT_AMBULATORY_CARE_PROVIDER_SITE_OTHER): Payer: Medicare Other

## 2019-09-04 DIAGNOSIS — I5022 Chronic systolic (congestive) heart failure: Secondary | ICD-10-CM

## 2019-09-04 DIAGNOSIS — Z95810 Presence of automatic (implantable) cardiac defibrillator: Secondary | ICD-10-CM | POA: Diagnosis not present

## 2019-09-05 NOTE — Progress Notes (Signed)
EPIC Encounter for ICM Monitoring  Patient Name: Anthony Rubio. is a 69 y.o. male Date: 09/05/2019 Primary Care Physican: Vivi Barrack, MD Primary Cardiologist:[facility] Electrophysiologist: Allred 09/05/2019 Weight:227lbs   Spoke with patient and reports feeling well at this time.  Denies fluid symptoms.  He has not been taking the evening dose of Furosemide.   Optivol thoracic impedancetrending slightly below baseline normal which not taking evening dose of Furosemide may contribute to possible fluid accumulation.  Prescribed:Furosemide to 40 mg 1 tablet every AM and 0.5 tablet(20 mg total)every PM.   Labs: 05/15/2019 Creatinine 1.95, BUN 37, Potassium 4.9, Sodium 140, GFR 34-39 08/10/2018 Creatinine 1.75, BUN 32, Potassium 4.8, Sodium 140, GFR 47.01 01/03/2018 Creatinine 1.62, BUN 23, Potassium 4.5, Sodium 143, GFR 43-50 A complete set of results can be found in Results Review  Recommendations: Advised to take the prescribed dosage of Furosemide 40 mg every morning and 20 mg every evening.  Advised to avoid restaurant foods that are high in salt.   Follow-up plan: ICM clinic phone appointment on3/11/2019 to recheck fluid levels. 91 day device clinic remote transmission 11/06/2019.   Copy of ICM check sent to Dr.Allred.  3 month ICM trend: 09/04/2019    1 Year ICM trend:       [name], RN 09/05/2019 4:57 PM

## 2019-09-08 ENCOUNTER — Ambulatory Visit (INDEPENDENT_AMBULATORY_CARE_PROVIDER_SITE_OTHER): Payer: Medicare Other

## 2019-09-08 DIAGNOSIS — Z95810 Presence of automatic (implantable) cardiac defibrillator: Secondary | ICD-10-CM

## 2019-09-08 DIAGNOSIS — I5022 Chronic systolic (congestive) heart failure: Secondary | ICD-10-CM

## 2019-09-08 NOTE — Progress Notes (Signed)
EPIC Encounter for ICM Monitoring  Patient Name: Anthony Rubio. is a 69 y.o. male Date: 09/08/2019 Primary Care Physican: Vivi Barrack, MD Primary Cardiologist:[place] Electrophysiologist: Allred 3/2/2021Weight:227lbs   Spoke with patient and reports feeling well at this time. Denies fluid symptoms.   Optivol thoracic impedance returned to normal.  Prescribed:Furosemide to 40 mg 1 tablet every AM and 0.5 tablet(20 mg total)every PM.   Labs: 05/15/2019 Creatinine 1.95, BUN 37, Potassium 4.9, Sodium 140, GFR 34-39 08/10/2018 Creatinine 1.75, BUN 32, Potassium 4.8, Sodium 140, GFR 47.01 01/03/2018 Creatinine 1.62, BUN 23, Potassium 4.5, Sodium 143, GFR 43-50 A complete set of results can be found in Results Review  Recommendations: No changes and encouraged to call if experiencing any fluid symptoms.   Follow-up plan: ICM clinic phone appointment on 10/09/2019. 91 day device clinic remote transmission5/09/2019.   Copy of ICM check sent to Dr.Allred.  3 month ICM trend: 09/08/2019    1 Year ICM trend:       [name], RN 09/08/2019 10:51 AM

## 2019-09-11 ENCOUNTER — Ambulatory Visit (INDEPENDENT_AMBULATORY_CARE_PROVIDER_SITE_OTHER): Payer: Medicare Other | Admitting: *Deleted

## 2019-09-11 ENCOUNTER — Other Ambulatory Visit (HOSPITAL_COMMUNITY): Payer: Self-pay | Admitting: Cardiology

## 2019-09-11 ENCOUNTER — Other Ambulatory Visit: Payer: Self-pay

## 2019-09-11 DIAGNOSIS — Z5181 Encounter for therapeutic drug level monitoring: Secondary | ICD-10-CM

## 2019-09-11 DIAGNOSIS — I483 Typical atrial flutter: Secondary | ICD-10-CM | POA: Diagnosis not present

## 2019-09-11 DIAGNOSIS — I5022 Chronic systolic (congestive) heart failure: Secondary | ICD-10-CM

## 2019-09-11 DIAGNOSIS — Z95810 Presence of automatic (implantable) cardiac defibrillator: Secondary | ICD-10-CM

## 2019-09-11 LAB — PROTIME-INR
INR: 6.8 (ref 0.9–1.2)
Prothrombin Time: 72 s — ABNORMAL HIGH (ref 9.1–12.0)

## 2019-09-11 LAB — POCT INR: INR: 6.1 — AB (ref 2.0–3.0)

## 2019-09-11 NOTE — Patient Instructions (Addendum)
Description   Stat back at 6.8. Called pt and advised him to skip warfarin 3/8, 3/9 and 3/10, take 1/2 tablet on Thursday, then start taking 1.5 tablets (7.5mg ) daily except 1 tablet on Mondays, Wednesdays and Fridays. Recheck INR in 1 week. Call for any questions or problems [phone].

## 2019-09-13 ENCOUNTER — Other Ambulatory Visit (HOSPITAL_COMMUNITY): Payer: Self-pay | Admitting: Cardiology

## 2019-09-18 ENCOUNTER — Ambulatory Visit (INDEPENDENT_AMBULATORY_CARE_PROVIDER_SITE_OTHER): Payer: Medicare Other

## 2019-09-18 ENCOUNTER — Other Ambulatory Visit: Payer: Self-pay

## 2019-09-18 DIAGNOSIS — I483 Typical atrial flutter: Secondary | ICD-10-CM | POA: Diagnosis not present

## 2019-09-18 DIAGNOSIS — Z5181 Encounter for therapeutic drug level monitoring: Secondary | ICD-10-CM | POA: Diagnosis not present

## 2019-09-18 LAB — POCT INR: INR: 1.8 — AB (ref 2.0–3.0)

## 2019-09-18 NOTE — Patient Instructions (Signed)
Description   Take 1.5 tablets today, then start taking 1.5 tablets daily except 1 tablet on Mondays and Fridays. Recheck INR in 2 weeks, pt requests 3 weeks follow-up. Call for any questions or problems [phone].

## 2019-09-27 ENCOUNTER — Ambulatory Visit: Payer: Medicare Other | Attending: Internal Medicine

## 2019-09-27 DIAGNOSIS — Z23 Encounter for immunization: Secondary | ICD-10-CM

## 2019-09-27 NOTE — Progress Notes (Signed)
   [id number]  Name:  Kimon Loewen.    MRN: [id number] DOB: Nov 07, 1950  09/27/2019  Mr. Boger was observed post Covid-19 immunization for 15 minutes without incident. He was provided with Vaccine Information Sheet and instruction to access the V-Safe system.   Mr. Shea was instructed to call 911 with any severe reactions post vaccine: Marland Kitchen Difficulty breathing  . Swelling of face and throat  . A fast heartbeat  . A bad rash all over body  . Dizziness and weakness   Immunizations Administered    Name Date Dose VIS Date Route   Pfizer COVID-19 Vaccine 09/27/2019  8:27 AM 0.3 mL 06/16/2019 Intramuscular   Manufacturer: [place]   Lot: [id number]   [place]: [address]-5

## 2019-10-09 ENCOUNTER — Ambulatory Visit (INDEPENDENT_AMBULATORY_CARE_PROVIDER_SITE_OTHER): Payer: Medicare Other

## 2019-10-09 ENCOUNTER — Ambulatory Visit (INDEPENDENT_AMBULATORY_CARE_PROVIDER_SITE_OTHER): Payer: Medicare Other | Admitting: Pharmacist

## 2019-10-09 ENCOUNTER — Other Ambulatory Visit: Payer: Self-pay

## 2019-10-09 DIAGNOSIS — Z95810 Presence of automatic (implantable) cardiac defibrillator: Secondary | ICD-10-CM

## 2019-10-09 DIAGNOSIS — I483 Typical atrial flutter: Secondary | ICD-10-CM

## 2019-10-09 DIAGNOSIS — I5022 Chronic systolic (congestive) heart failure: Secondary | ICD-10-CM

## 2019-10-09 DIAGNOSIS — Z5181 Encounter for therapeutic drug level monitoring: Secondary | ICD-10-CM

## 2019-10-09 LAB — POCT INR: INR: 3.8 — AB (ref 2.0–3.0)

## 2019-10-09 NOTE — Patient Instructions (Signed)
Description   Hold tonight's dose, then continue taking 1.5 tablets daily except 1 tablet on Mondays and Fridays. Recheck INR in 2 weeks, pt requests 3 weeks follow-up. Call for any questions or problems [phone].

## 2019-10-09 NOTE — Progress Notes (Signed)
EPIC Encounter for ICM Monitoring  Patient Name: Anthony Rubio. is a 69 y.o. male Date: 10/09/2019 Primary Care Physican: Vivi Barrack, MD Primary Cardiologist:[facility] Electrophysiologist: Allred 4/5/2021Weight:227- 230 lbs   Spoke with patient and reports feeling well at this time. Denies fluid symptoms.  Optivol thoracic impedance normal.  Prescribed:Furosemide to 40 mg 1 tablet every AM and 0.5 tablet(20 mg total)every PM.   Labs: 05/15/2019 Creatinine 1.95, BUN 37, Potassium 4.9, Sodium 140, GFR 34-39 08/10/2018 Creatinine 1.75, BUN 32, Potassium 4.8, Sodium 140, GFR 47.01 01/03/2018 Creatinine 1.62, BUN 23, Potassium 4.5, Sodium 143, GFR 43-50 A complete set of results can be found in Results Review  Recommendations: No changes and encouraged to call if experiencing any fluid symptoms.   Follow-up plan: ICM clinic phone appointment on 11/15/2019. 91 day device clinic remote transmission5/09/2019.   Copy of ICM check sent to Dr.Allred.  3 month ICM trend: 10/09/2019    1 Year ICM trend:       [name], RN 10/09/2019 4:34 PM

## 2019-10-13 ENCOUNTER — Telehealth: Payer: Self-pay | Admitting: Family Medicine

## 2019-10-13 NOTE — Telephone Encounter (Signed)
I left a message asking the patient to call and schedule Medicare AWV with Loma Sousa ([place]).  If patient calls back, please schedule Medicare Wellness Visit at next available opening. Last AWV 08/04/2018 [facility] ([place])

## 2019-10-27 ENCOUNTER — Ambulatory Visit (INDEPENDENT_AMBULATORY_CARE_PROVIDER_SITE_OTHER): Payer: Medicare Other | Admitting: *Deleted

## 2019-10-27 ENCOUNTER — Other Ambulatory Visit: Payer: Self-pay

## 2019-10-27 DIAGNOSIS — Z5181 Encounter for therapeutic drug level monitoring: Secondary | ICD-10-CM | POA: Diagnosis not present

## 2019-10-27 DIAGNOSIS — I483 Typical atrial flutter: Secondary | ICD-10-CM

## 2019-10-27 LAB — POCT INR: INR: 5.5 — AB (ref 2.0–3.0)

## 2019-10-27 NOTE — Patient Instructions (Addendum)
Description   Hold 4/23, 4/24 and 4/25, then start taking 1 tablet daily except for 1.5 tablets Sundays, Tuesdays and Thursdays.  Recheck INR in 2 weeks, 3 weeks per pt request. Pt educated on risk of delaying getting INR checked. Call for any questions or problems [phone].

## 2019-11-06 ENCOUNTER — Ambulatory Visit (INDEPENDENT_AMBULATORY_CARE_PROVIDER_SITE_OTHER): Payer: Medicare Other | Admitting: *Deleted

## 2019-11-06 DIAGNOSIS — I429 Cardiomyopathy, unspecified: Secondary | ICD-10-CM | POA: Diagnosis not present

## 2019-11-06 LAB — CUP PACEART REMOTE DEVICE CHECK
Battery Remaining Longevity: 107 mo
Battery Voltage: 3.01 V
Brady Statistic AP VP Percent: 0.22 %
Brady Statistic AP VS Percent: 61.24 %
Brady Statistic AS VP Percent: 0.03 %
Brady Statistic AS VS Percent: 38.51 %
Brady Statistic RA Percent Paced: 61.07 %
Brady Statistic RV Percent Paced: 0.28 %
Date Time Interrogation Session: 20210503072205
HighPow Impedance: 42 Ohm
HighPow Impedance: 51 Ohm
Implantable Lead Implant Date: 20111108
Implantable Lead Implant Date: 20111108
Implantable Lead Location: 753859
Implantable Lead Location: 753860
Implantable Lead Model: 5076
Implantable Lead Model: 6947
Implantable Pulse Generator Implant Date: 20190730
Lead Channel Impedance Value: 285 Ohm
Lead Channel Impedance Value: 342 Ohm
Lead Channel Impedance Value: 475 Ohm
Lead Channel Pacing Threshold Amplitude: 0.625 V
Lead Channel Pacing Threshold Amplitude: 0.875 V
Lead Channel Pacing Threshold Pulse Width: 0.4 ms
Lead Channel Pacing Threshold Pulse Width: 0.4 ms
Lead Channel Sensing Intrinsic Amplitude: 3 mV
Lead Channel Sensing Intrinsic Amplitude: 3 mV
Lead Channel Sensing Intrinsic Amplitude: 6 mV
Lead Channel Sensing Intrinsic Amplitude: 6 mV
Lead Channel Setting Pacing Amplitude: 2 V
Lead Channel Setting Pacing Amplitude: 2.5 V
Lead Channel Setting Pacing Pulse Width: 0.4 ms
Lead Channel Setting Sensing Sensitivity: 0.3 mV

## 2019-11-07 ENCOUNTER — Other Ambulatory Visit (HOSPITAL_COMMUNITY): Payer: Self-pay | Admitting: Cardiology

## 2019-11-07 NOTE — Progress Notes (Signed)
Remote ICD transmission.   

## 2019-11-10 ENCOUNTER — Telehealth: Payer: Self-pay | Admitting: *Deleted

## 2019-11-10 NOTE — Telephone Encounter (Signed)
Discussed with pharm D, Melissa, that tumeric tea can increase pt's risk for bleeding and cranberry juice can raise INR. Remembered talking to pt about Tumeric Tea at last coumadin appointment. Called pt who stated that he was still drinking Tumeric Tea with some cranberry juice every couple of days. Informed him that the tumeric tea can increase his risk of bleeding and that cranberry juice can increase his INR. Pt stated that he would stop drinking TumericTea with cranberry juice and stated that he would come on 11/17/2019 to get his INR checked. Pt thanked me for the call.

## 2019-11-15 ENCOUNTER — Telehealth: Payer: Self-pay

## 2019-11-15 ENCOUNTER — Ambulatory Visit (INDEPENDENT_AMBULATORY_CARE_PROVIDER_SITE_OTHER): Payer: Medicare Other

## 2019-11-15 DIAGNOSIS — Z95810 Presence of automatic (implantable) cardiac defibrillator: Secondary | ICD-10-CM

## 2019-11-15 DIAGNOSIS — I5022 Chronic systolic (congestive) heart failure: Secondary | ICD-10-CM

## 2019-11-15 NOTE — Telephone Encounter (Signed)
I let the pt know we did get his transmission.

## 2019-11-15 NOTE — Telephone Encounter (Signed)
Left message for patient to remind of missed remote transmission.  

## 2019-11-15 NOTE — Telephone Encounter (Signed)
New message  Patient is calling in to get assistance with sending a transmission. Please call and assist.

## 2019-11-17 ENCOUNTER — Other Ambulatory Visit: Payer: Self-pay

## 2019-11-17 ENCOUNTER — Ambulatory Visit (INDEPENDENT_AMBULATORY_CARE_PROVIDER_SITE_OTHER): Payer: Medicare Other

## 2019-11-17 DIAGNOSIS — Z5181 Encounter for therapeutic drug level monitoring: Secondary | ICD-10-CM | POA: Diagnosis not present

## 2019-11-17 DIAGNOSIS — I483 Typical atrial flutter: Secondary | ICD-10-CM

## 2019-11-17 LAB — POCT INR: INR: 3.7 — AB (ref 2.0–3.0)

## 2019-11-17 NOTE — Progress Notes (Signed)
EPIC Encounter for ICM Monitoring  Patient Name: Anthony Rubio. is a 69 y.o. male Date: 11/17/2019 Primary Care Physican: Vivi Barrack, MD Primary [place] Electrophysiologist: Allred 4/5/2021Weight:227- 230 lbs  Time in AT/AF <0.1 hr/day (<0.1%)   Spoke with patient and reports feeling well at this time.  Denies fluid symptoms.    Optivol thoracic impedancenormal.  Prescribed:Furosemide to 40 mg 1 tablet every AM and 0.5 tablet(20 mg total)every PM.   Labs: 05/15/2019 Creatinine 1.95, BUN 37, Potassium 4.9, Sodium 140, GFR 34-39 08/10/2018 Creatinine 1.75, BUN 32, Potassium 4.8, Sodium 140, GFR 47.01 01/03/2018 Creatinine 1.62, BUN 23, Potassium 4.5, Sodium 143, GFR 43-50 A complete set of results can be found in Results Review  Recommendations: No changes and encouraged to call if experiencing any fluid symptoms.  Follow-up plan: ICM clinic phone appointment on 12/19/2019. 91 day device clinic remote transmission8/08/2019.   Copy of ICM check sent to Dr.Allred.  3 month ICM trend: 11/17/2019    1 Year ICM trend:       [name], RN 11/17/2019 11:25 AM

## 2019-11-17 NOTE — Patient Instructions (Signed)
Description   Skip today's dosage of Warfarin, then start taking 1 tablet daily except for 1.5 tablets Sundays and Thursdays.  Recheck INR in 3 weeks. Call for any questions or problems [phone].

## 2019-11-20 ENCOUNTER — Other Ambulatory Visit: Payer: Self-pay | Admitting: Internal Medicine

## 2019-12-01 ENCOUNTER — Other Ambulatory Visit (HOSPITAL_COMMUNITY): Payer: Self-pay | Admitting: Cardiology

## 2019-12-05 ENCOUNTER — Other Ambulatory Visit (HOSPITAL_COMMUNITY): Payer: Self-pay

## 2019-12-05 NOTE — Telephone Encounter (Signed)
error 

## 2019-12-08 ENCOUNTER — Other Ambulatory Visit: Payer: Self-pay

## 2019-12-08 ENCOUNTER — Ambulatory Visit (INDEPENDENT_AMBULATORY_CARE_PROVIDER_SITE_OTHER): Payer: Medicare Other

## 2019-12-08 DIAGNOSIS — I483 Typical atrial flutter: Secondary | ICD-10-CM | POA: Diagnosis not present

## 2019-12-08 DIAGNOSIS — Z5181 Encounter for therapeutic drug level monitoring: Secondary | ICD-10-CM

## 2019-12-08 LAB — POCT INR: INR: 4.7 — AB (ref 2.0–3.0)

## 2019-12-08 NOTE — Patient Instructions (Signed)
Description   Skip today and tomorrow's dosage of Warfarin, then start taking 1 tablet daily.  Recheck INR in 2 weeks. Pt requests 3 week follow-up.  Call for any questions or problems [phone].

## 2019-12-14 ENCOUNTER — Other Ambulatory Visit (HOSPITAL_COMMUNITY): Payer: Self-pay | Admitting: Cardiology

## 2019-12-19 ENCOUNTER — Ambulatory Visit (INDEPENDENT_AMBULATORY_CARE_PROVIDER_SITE_OTHER): Payer: Medicare Other

## 2019-12-19 DIAGNOSIS — I5022 Chronic systolic (congestive) heart failure: Secondary | ICD-10-CM | POA: Diagnosis not present

## 2019-12-19 DIAGNOSIS — Z95810 Presence of automatic (implantable) cardiac defibrillator: Secondary | ICD-10-CM

## 2019-12-22 NOTE — Progress Notes (Signed)
EPIC Encounter for ICM Monitoring  Patient Name: Timonthy Hovater. is a 69 y.o. male Date: 12/22/2019 Primary Care Physican: Vivi Barrack, MD Primary Cardiologist:[facility] Electrophysiologist: Allred 6/18/2021Weight:227- 230lbs  Time in AT/AF  <0.1 hr/day (<0.1%)   Spoke with patient and reports feeling well at this time.  Denies fluid symptoms.    Optivol thoracic impedancenormal.  Prescribed:Furosemide to 40 mg 1 tablet every AM and 0.5 tablet(20 mg total)every PM.   Labs: 05/15/2019 Creatinine 1.95, BUN 37, Potassium 4.9, Sodium 140, GFR 34-39 08/10/2018 Creatinine 1.75, BUN 32, Potassium 4.8, Sodium 140, GFR 47.01 01/03/2018 Creatinine 1.62, BUN 23, Potassium 4.5, Sodium 143, GFR 43-50 A complete set of results can be found in Results Review  Recommendations: No changes and encouraged to call if experiencing any fluid symptoms.  Follow-up plan: ICM clinic phone appointment on 01/22/2020. 91 day device clinic remote transmission8/08/2019.   Copy of ICM check sent to Dr.Allred.  3 month ICM trend: 12/19/2019    1 Year ICM trend:       [name], RN 12/22/2019 4:10 PM

## 2019-12-28 ENCOUNTER — Other Ambulatory Visit: Payer: Self-pay

## 2019-12-28 ENCOUNTER — Ambulatory Visit (INDEPENDENT_AMBULATORY_CARE_PROVIDER_SITE_OTHER): Payer: Medicare Other | Admitting: *Deleted

## 2019-12-28 DIAGNOSIS — Z5181 Encounter for therapeutic drug level monitoring: Secondary | ICD-10-CM | POA: Diagnosis not present

## 2019-12-28 DIAGNOSIS — I483 Typical atrial flutter: Secondary | ICD-10-CM | POA: Diagnosis not present

## 2019-12-28 LAB — POCT INR: INR: 3 (ref 2.0–3.0)

## 2019-12-28 NOTE — Patient Instructions (Signed)
Description   Continue taking 1 tablet of Warfarin daily.  Recheck INR in 4 weeks per request. Call for any questions or problems [phone].

## 2020-01-22 ENCOUNTER — Ambulatory Visit (INDEPENDENT_AMBULATORY_CARE_PROVIDER_SITE_OTHER): Payer: Medicare Other

## 2020-01-22 DIAGNOSIS — Z95810 Presence of automatic (implantable) cardiac defibrillator: Secondary | ICD-10-CM | POA: Diagnosis not present

## 2020-01-22 DIAGNOSIS — I5022 Chronic systolic (congestive) heart failure: Secondary | ICD-10-CM

## 2020-01-24 ENCOUNTER — Telehealth: Payer: Self-pay

## 2020-01-24 NOTE — Progress Notes (Signed)
EPIC Encounter for ICM Monitoring  Patient Name: Anthony Rubio. is a 69 y.o. male Date: 01/24/2020 Primary Care Physican: Vivi Barrack, MD Primary [place] Electrophysiologist: Allred 6/18/2021Weight:227- 230lbs  Time in AT/AF<0.1 hr/day (<0.1%)   Attempted call to patient and unable to reach.  Left detailed message per DPR regarding transmission. Transmission reviewed.   Optivol thoracic impedancenormal.  Prescribed:Furosemide to 40 mg 1 tablet every AM and 0.5 tablet(20 mg total)every PM.   Labs: 05/15/2019 Creatinine 1.95, BUN 37, Potassium 4.9, Sodium 140, GFR 34-39 08/10/2018 Creatinine 1.75, BUN 32, Potassium 4.8, Sodium 140, GFR 47.01 01/03/2018 Creatinine 1.62, BUN 23, Potassium 4.5, Sodium 143, GFR 43-50 A complete set of results can be found in Results Review  Recommendations: Left voice mail with ICM number and encouraged to call if experiencing any fluid symptoms.  Follow-up plan: ICM clinic phone appointment on8/23/2021. 91 day device clinic remote transmission8/08/2019.    EP/Cardiology Office Visits: Recall for 05/14/2020 with Dr. Rayann Heman.    Copy of ICM check sent to Dr. Rayann Heman.   3 month ICM trend: 01/23/2020    1 Year ICM trend:       [name], RN 01/24/2020 12:35 PM

## 2020-01-24 NOTE — Telephone Encounter (Signed)
Remote ICM transmission received.  Attempted call to patient regarding ICM remote transmission and left detailed message per DPR.  Advised to return call for any fluid symptoms or questions. Next ICM remote transmission scheduled 02/26/2020.   ° ° °

## 2020-01-25 ENCOUNTER — Other Ambulatory Visit (HOSPITAL_COMMUNITY): Payer: Self-pay | Admitting: Cardiology

## 2020-01-25 DIAGNOSIS — I5022 Chronic systolic (congestive) heart failure: Secondary | ICD-10-CM

## 2020-01-25 DIAGNOSIS — Z95810 Presence of automatic (implantable) cardiac defibrillator: Secondary | ICD-10-CM

## 2020-01-26 ENCOUNTER — Other Ambulatory Visit: Payer: Self-pay

## 2020-01-26 ENCOUNTER — Ambulatory Visit (INDEPENDENT_AMBULATORY_CARE_PROVIDER_SITE_OTHER): Payer: Medicare Other

## 2020-01-26 DIAGNOSIS — Z5181 Encounter for therapeutic drug level monitoring: Secondary | ICD-10-CM | POA: Diagnosis not present

## 2020-01-26 DIAGNOSIS — I483 Typical atrial flutter: Secondary | ICD-10-CM | POA: Diagnosis not present

## 2020-01-26 LAB — POCT INR: INR: 1.9 — AB (ref 2.0–3.0)

## 2020-01-26 NOTE — Patient Instructions (Signed)
Take 1.5 tablets today and then Continue taking 1 tablet of Warfarin daily.  Recheck INR in 4 weeks per request. Call for any questions or problems [phone].

## 2020-02-05 ENCOUNTER — Ambulatory Visit (INDEPENDENT_AMBULATORY_CARE_PROVIDER_SITE_OTHER): Payer: Medicare Other | Admitting: *Deleted

## 2020-02-05 DIAGNOSIS — I429 Cardiomyopathy, unspecified: Secondary | ICD-10-CM | POA: Diagnosis not present

## 2020-02-06 LAB — CUP PACEART REMOTE DEVICE CHECK
Battery Remaining Longevity: 104 mo
Battery Voltage: 3.01 V
Brady Statistic AP VP Percent: 0.18 %
Brady Statistic AP VS Percent: 61.95 %
Brady Statistic AS VP Percent: 0.02 %
Brady Statistic AS VS Percent: 37.85 %
Brady Statistic RA Percent Paced: 61.77 %
Brady Statistic RV Percent Paced: 0.23 %
Date Time Interrogation Session: 20210802012202
HighPow Impedance: 41 Ohm
HighPow Impedance: 50 Ohm
Implantable Lead Implant Date: 20111108
Implantable Lead Implant Date: 20111108
Implantable Lead Location: 753859
Implantable Lead Location: 753860
Implantable Lead Model: 5076
Implantable Lead Model: 6947
Implantable Pulse Generator Implant Date: 20190730
Lead Channel Impedance Value: 285 Ohm
Lead Channel Impedance Value: 342 Ohm
Lead Channel Impedance Value: 475 Ohm
Lead Channel Pacing Threshold Amplitude: 0.75 V
Lead Channel Pacing Threshold Amplitude: 1 V
Lead Channel Pacing Threshold Pulse Width: 0.4 ms
Lead Channel Pacing Threshold Pulse Width: 0.4 ms
Lead Channel Sensing Intrinsic Amplitude: 2.875 mV
Lead Channel Sensing Intrinsic Amplitude: 2.875 mV
Lead Channel Sensing Intrinsic Amplitude: 5.375 mV
Lead Channel Sensing Intrinsic Amplitude: 5.375 mV
Lead Channel Setting Pacing Amplitude: 2 V
Lead Channel Setting Pacing Amplitude: 2.5 V
Lead Channel Setting Pacing Pulse Width: 0.4 ms
Lead Channel Setting Sensing Sensitivity: 0.3 mV

## 2020-02-08 NOTE — Progress Notes (Signed)
Remote ICD transmission.   

## 2020-02-22 ENCOUNTER — Other Ambulatory Visit: Payer: Self-pay

## 2020-02-22 ENCOUNTER — Ambulatory Visit (INDEPENDENT_AMBULATORY_CARE_PROVIDER_SITE_OTHER): Payer: Medicare Other

## 2020-02-22 DIAGNOSIS — Z5181 Encounter for therapeutic drug level monitoring: Secondary | ICD-10-CM | POA: Diagnosis not present

## 2020-02-22 DIAGNOSIS — I483 Typical atrial flutter: Secondary | ICD-10-CM

## 2020-02-22 LAB — POCT INR: INR: 4.4 — AB (ref 2.0–3.0)

## 2020-02-22 NOTE — Patient Instructions (Signed)
Description   Skip today and tomorrow's dosage of Warfarin, then resume same dosage 1 tablet of Warfarin daily.  Recommended sooner follow-up, pt requests 4 week follow-up, aware of risks. Call for any questions or problems [phone].

## 2020-02-26 ENCOUNTER — Ambulatory Visit (INDEPENDENT_AMBULATORY_CARE_PROVIDER_SITE_OTHER): Payer: Medicare Other

## 2020-02-26 DIAGNOSIS — Z95810 Presence of automatic (implantable) cardiac defibrillator: Secondary | ICD-10-CM | POA: Diagnosis not present

## 2020-02-26 DIAGNOSIS — I5022 Chronic systolic (congestive) heart failure: Secondary | ICD-10-CM

## 2020-02-28 NOTE — Progress Notes (Signed)
EPIC Encounter for ICM Monitoring  Patient Name: Anthony Rubio. is a 69 y.o. male Date: 02/28/2020 Primary Care Physican: Vivi Barrack, MD Primary Cardiologist:[facility] Electrophysiologist: Allred 6/18/2021Weight:227- 230lbs  Time in AT/AF<0.1 hr/day (<0.1%)   Transmission reviewed.   Optivol thoracic impedancesuggesting normal fluid levels.  Prescribed:Furosemide to 40 mg 1 tablet every AM and 0.5 tablet(20 mg total)every PM.   Labs: 05/15/2019 Creatinine 1.95, BUN 37, Potassium 4.9, Sodium 140, GFR 34-39 08/10/2018 Creatinine 1.75, BUN 32, Potassium 4.8, Sodium 140, GFR 47.01 01/03/2018 Creatinine 1.62, BUN 23, Potassium 4.5, Sodium 143, GFR 43-50 A complete set of results can be found in Results Review  Recommendations:No changes  Follow-up plan: ICM clinic phone appointment on9/27/2021. 91 day device clinic remote transmission 05/06/2020.    EP/Cardiology Office Visits: Recall for 05/14/2020 with Dr. Rayann Heman.    Copy of ICM check sent to Dr. Rayann Heman.    3 month ICM trend: 02/26/2020    1 Year ICM trend:       [name], RN 02/28/2020 3:26 PM

## 2020-03-15 ENCOUNTER — Other Ambulatory Visit (HOSPITAL_COMMUNITY): Payer: Self-pay | Admitting: Cardiology

## 2020-03-21 ENCOUNTER — Other Ambulatory Visit: Payer: Self-pay

## 2020-03-21 ENCOUNTER — Ambulatory Visit (INDEPENDENT_AMBULATORY_CARE_PROVIDER_SITE_OTHER): Payer: Medicare Other | Admitting: *Deleted

## 2020-03-21 DIAGNOSIS — I483 Typical atrial flutter: Secondary | ICD-10-CM | POA: Diagnosis not present

## 2020-03-21 DIAGNOSIS — Z5181 Encounter for therapeutic drug level monitoring: Secondary | ICD-10-CM | POA: Diagnosis not present

## 2020-03-21 LAB — POCT INR: INR: 1.2 — AB (ref 2.0–3.0)

## 2020-03-21 NOTE — Patient Instructions (Addendum)
Description   Today and tomorrow take 1.5 tablets of Warfarin then continue taking 1 tablet of Warfarin daily. Do not hold your doses without recommendation because you put yourself at risk for a stroke and blood clots. I recommended follow up in 1 week and since you can't come in sooner for follow-up know that you are taking a risk for stroke & clot, pt requests 3 week follow-up, aware of risks. Call for any questions or problems [phone].

## 2020-04-01 ENCOUNTER — Ambulatory Visit (INDEPENDENT_AMBULATORY_CARE_PROVIDER_SITE_OTHER): Payer: Medicare Other

## 2020-04-01 DIAGNOSIS — I5022 Chronic systolic (congestive) heart failure: Secondary | ICD-10-CM | POA: Diagnosis not present

## 2020-04-01 DIAGNOSIS — Z95810 Presence of automatic (implantable) cardiac defibrillator: Secondary | ICD-10-CM

## 2020-04-03 ENCOUNTER — Telehealth: Payer: Self-pay

## 2020-04-03 NOTE — Progress Notes (Signed)
EPIC Encounter for ICM Monitoring  Patient Name: Anthony Rubio. is a 69 y.o. male Date: 04/03/2020 Primary Care Physican: Vivi Barrack, MD Primary Cardiologist:[place] Electrophysiologist: Allred 6/18/2021Weight:227- 230lbs  Time in AT/AF<0.1 hr/day (<0.1%) (taking Warfarin) Longest AT/AF 4 minutes   Attempted call to patient and unable to reach.  Left detailed message per DPR regarding transmission. Transmission reviewed.   Optivol thoracic impedancesuggesting possible fluid accumulation starting 03/19/2020 but returned to baseline 04/02/2020.  Prescribed:Furosemide to 40 mg 1 tablet every AM and 0.5 tablet(20 mg total)every PM.   Labs: 05/15/2019 Creatinine 1.95, BUN 37, Potassium 4.9, Sodium 140, GFR 34-39 08/10/2018 Creatinine 1.75, BUN 32, Potassium 4.8, Sodium 140, GFR 47.01 01/03/2018 Creatinine 1.62, BUN 23, Potassium 4.5, Sodium 143, GFR 43-50 A complete set of results can be found in Results Review  Recommendations:Left voice mail with ICM number and encouraged to call if experiencing any fluid symptoms.  Follow-up plan: ICM clinic phone appointment on11/08/2019. 91 day device clinic remote transmission 05/06/2020.   EP/Cardiology Office Visits:Recall for 05/14/2020 with Dr.Allred. Last visit with Dr Aundra Dubin was 08/31/2017 and needs appointment to continue Furosemide refills per medication note  Copy of ICM check sent to Dr.Allred.     3 month ICM trend: 04/02/2020    1 Year ICM trend:       [name], RN 04/03/2020 10:58 AM

## 2020-04-03 NOTE — Telephone Encounter (Signed)
Remote ICM transmission received.  Attempted call to patient regarding ICM remote transmission and left detailed message per DPR.  Advised to return call for any fluid symptoms or questions. Next ICM remote transmission scheduled 05/07/2020.

## 2020-04-10 ENCOUNTER — Other Ambulatory Visit: Payer: Self-pay

## 2020-04-10 ENCOUNTER — Ambulatory Visit (INDEPENDENT_AMBULATORY_CARE_PROVIDER_SITE_OTHER): Payer: Medicare Other | Admitting: *Deleted

## 2020-04-10 DIAGNOSIS — I483 Typical atrial flutter: Secondary | ICD-10-CM

## 2020-04-10 DIAGNOSIS — Z5181 Encounter for therapeutic drug level monitoring: Secondary | ICD-10-CM | POA: Diagnosis not present

## 2020-04-10 LAB — POCT INR: INR: 2.7 (ref 2.0–3.0)

## 2020-04-10 MED ORDER — WARFARIN SODIUM 5 MG PO TABS: ORAL_TABLET | ORAL | 0 refills | Status: DC

## 2020-04-10 NOTE — Patient Instructions (Addendum)
Description   Continue taking the dose you have been taking, which is, 1 tablet of Warfarin daily except 1.5 tablets on Fridays. Do not hold your doses without recommendation because you put yourself at risk for a stroke and blood clots and do not take any extra without our recommendations. Recheck INR in 4 weeks follow-up, aware of risks. Call for any questions or problems [phone].

## 2020-05-02 DIAGNOSIS — Z23 Encounter for immunization: Secondary | ICD-10-CM | POA: Diagnosis not present

## 2020-05-06 ENCOUNTER — Ambulatory Visit (INDEPENDENT_AMBULATORY_CARE_PROVIDER_SITE_OTHER): Payer: Medicare Other

## 2020-05-06 DIAGNOSIS — I429 Cardiomyopathy, unspecified: Secondary | ICD-10-CM | POA: Diagnosis not present

## 2020-05-07 ENCOUNTER — Ambulatory Visit: Payer: Medicare Other

## 2020-05-07 DIAGNOSIS — Z95810 Presence of automatic (implantable) cardiac defibrillator: Secondary | ICD-10-CM

## 2020-05-07 DIAGNOSIS — I5022 Chronic systolic (congestive) heart failure: Secondary | ICD-10-CM

## 2020-05-07 LAB — CUP PACEART REMOTE DEVICE CHECK
Battery Remaining Longevity: 100 mo
Battery Voltage: 3 V
Brady Statistic AP VP Percent: 1.69 %
Brady Statistic AP VS Percent: 75.31 %
Brady Statistic AS VP Percent: 0.08 %
Brady Statistic AS VS Percent: 22.91 %
Brady Statistic RA Percent Paced: 75.6 %
Brady Statistic RV Percent Paced: 1.93 %
Date Time Interrogation Session: 20211101142726
HighPow Impedance: 43 Ohm
HighPow Impedance: 51 Ohm
Implantable Lead Implant Date: 20111108
Implantable Lead Implant Date: 20111108
Implantable Lead Location: 753859
Implantable Lead Location: 753860
Implantable Lead Model: 5076
Implantable Lead Model: 6947
Implantable Pulse Generator Implant Date: 20190730
Lead Channel Impedance Value: 285 Ohm
Lead Channel Impedance Value: 361 Ohm
Lead Channel Impedance Value: 475 Ohm
Lead Channel Pacing Threshold Amplitude: 0.625 V
Lead Channel Pacing Threshold Amplitude: 0.875 V
Lead Channel Pacing Threshold Pulse Width: 0.4 ms
Lead Channel Pacing Threshold Pulse Width: 0.4 ms
Lead Channel Sensing Intrinsic Amplitude: 3 mV
Lead Channel Sensing Intrinsic Amplitude: 3 mV
Lead Channel Sensing Intrinsic Amplitude: 5 mV
Lead Channel Sensing Intrinsic Amplitude: 5 mV
Lead Channel Setting Pacing Amplitude: 2 V
Lead Channel Setting Pacing Amplitude: 2.5 V
Lead Channel Setting Pacing Pulse Width: 0.4 ms
Lead Channel Setting Sensing Sensitivity: 0.3 mV

## 2020-05-08 ENCOUNTER — Other Ambulatory Visit: Payer: Self-pay

## 2020-05-08 ENCOUNTER — Ambulatory Visit (INDEPENDENT_AMBULATORY_CARE_PROVIDER_SITE_OTHER): Payer: Medicare Other | Admitting: *Deleted

## 2020-05-08 DIAGNOSIS — Z5181 Encounter for therapeutic drug level monitoring: Secondary | ICD-10-CM | POA: Diagnosis not present

## 2020-05-08 DIAGNOSIS — I483 Typical atrial flutter: Secondary | ICD-10-CM

## 2020-05-08 LAB — POCT INR: INR: 1.7 — AB (ref 2.0–3.0)

## 2020-05-08 NOTE — Patient Instructions (Addendum)
Description   Take 1.5 tablets today and then continue to take 1 tablet daily except for 1.5 tablets on Fridays.  Recheck INR in 5 weeks follow-up, aware of risks. Call for any questions or problems [phone].

## 2020-05-08 NOTE — Progress Notes (Signed)
Remote ICD transmission.   

## 2020-05-13 NOTE — Progress Notes (Signed)
EPIC Encounter for ICM Monitoring  Patient Name: Anthony Rubio. is a 69 y.o. male Date: 05/13/2020 Primary Care Physican: Vivi Barrack, MD Primary Cardiologist:[facility] Electrophysiologist: Allred 6/18/2021Weight:227- 230lbs  Time in AT/AF<0.1 hr/day (<0.1%) (taking Warfarin)       Transmission reviewed.   Optivol thoracic impedancesuggestingnormal fluid levels.  Prescribed:Furosemide to 40 mg 1 tablet every AM and 0.5 tablet(20 mg total)every PM.   Labs: 05/15/2019 Creatinine 1.95, BUN 37, Potassium 4.9, Sodium 140, GFR 34-39 08/10/2018 Creatinine 1.75, BUN 32, Potassium 4.8, Sodium 140, GFR 47.01 01/03/2018 Creatinine 1.62, BUN 23, Potassium 4.5, Sodium 143, GFR 43-50 A complete set of results can be found in Results Review  Recommendations:No change.  Follow-up plan: ICM clinic phone appointment on12/01/2020. 91 day device clinic remote transmission1/31/2021.   EP/Cardiology Office Visits:06/11/2020 with Dr Aundra Dubin.  Recall 05/14/2020 with Dr Rayann Heman.  Copy of ICM check sent to Dr.Allred.   3 month ICM trend: 05/06/2020    1 Year ICM trend:       [name], RN 05/13/2020 10:09 AM

## 2020-05-21 ENCOUNTER — Telehealth: Payer: Self-pay | Admitting: *Deleted

## 2020-05-28 ENCOUNTER — Encounter: Payer: Self-pay | Admitting: Family Medicine

## 2020-05-28 ENCOUNTER — Other Ambulatory Visit: Payer: Self-pay

## 2020-05-28 ENCOUNTER — Ambulatory Visit (INDEPENDENT_AMBULATORY_CARE_PROVIDER_SITE_OTHER): Payer: Medicare Other | Admitting: Family Medicine

## 2020-05-28 VITALS — BP 107/68 | HR 64 | Temp 98.0°F | Ht 75.0 in | Wt 227.6 lb

## 2020-05-28 DIAGNOSIS — I5022 Chronic systolic (congestive) heart failure: Secondary | ICD-10-CM | POA: Diagnosis not present

## 2020-05-28 DIAGNOSIS — I152 Hypertension secondary to endocrine disorders: Secondary | ICD-10-CM | POA: Diagnosis not present

## 2020-05-28 DIAGNOSIS — IMO0002 Reserved for concepts with insufficient information to code with codable children: Secondary | ICD-10-CM

## 2020-05-28 DIAGNOSIS — I483 Typical atrial flutter: Secondary | ICD-10-CM

## 2020-05-28 DIAGNOSIS — E1159 Type 2 diabetes mellitus with other circulatory complications: Secondary | ICD-10-CM | POA: Diagnosis not present

## 2020-05-28 DIAGNOSIS — E1140 Type 2 diabetes mellitus with diabetic neuropathy, unspecified: Secondary | ICD-10-CM

## 2020-05-28 DIAGNOSIS — E1165 Type 2 diabetes mellitus with hyperglycemia: Secondary | ICD-10-CM | POA: Diagnosis not present

## 2020-05-28 LAB — POCT GLYCOSYLATED HEMOGLOBIN (HGB A1C): Hemoglobin A1C: 8.1 % — AB (ref 4.0–5.6)

## 2020-05-28 NOTE — Patient Instructions (Signed)
It was very nice to see you today!  Your A1c is stable today.  Please continue working on diet and exercise.  I would like to see you back in 3 to 6 months to recheck.  Please come back to see me sooner if needed.  Take care, Dr Jerline Pain  Please try these tips to maintain a healthy lifestyle:   Eat at least 3 REAL meals and 1-2 snacks per day.  Aim for no more than 5 hours between eating.  If you eat breakfast, please do so within one hour of getting up.    Each meal should contain half fruits/vegetables, one quarter protein, and one quarter carbs (no bigger than a computer mouse)   Cut down on sweet beverages. This includes juice, soda, and sweet tea.     Drink at least 1 glass of water with each meal and aim for at least 8 glasses per day   Exercise at least 150 minutes every week.

## 2020-05-28 NOTE — Assessment & Plan Note (Signed)
A1c stable but slightly above goal at 8.1. Will continue current dose of glipizide 10mg  daily. Follow up in 6 months.

## 2020-05-28 NOTE — Assessment & Plan Note (Signed)
Stable. No signs of overload. Continue management per cardiology.

## 2020-05-28 NOTE — Assessment & Plan Note (Signed)
Regular rhythm today. 

## 2020-05-28 NOTE — Progress Notes (Signed)
   Aaronjames Kelsay. is a 69 y.o. male who presents today for an office visit.  Assessment/Plan:  Chronic Problems Addressed Today: Chronic systolic heart failure Stable. No signs of overload. Continue management per cardiology.   Atrial flutter (HCC) Regular rhythm today.   Hypertension associated with diabetes ([place]) At goal. Continue coreg 18.75mg  twice daily, spironolactone 25mg  daily, and imdur 15mg  daily.   Type 2 diabetes, uncontrolled, with neuropathy (HCC) A1c stable but slightly above goal at 8.1. Will continue current dose of glipizide 10mg  daily. Follow up in 6 months.     Subjective:  HPI:  See A/p.         Objective:  Physical Exam: BP 107/68   Pulse 64   Temp 98 F (36.7 C) (Temporal)   Ht 6\' 3"  (1.905 m)   Wt 227 lb 9.6 oz (103.2 kg)   SpO2 96%   BMI 28.45 kg/m   Gen: No acute distress, resting comfortably CV: Regular rate and rhythm with no murmurs appreciated Pulm: Normal work of breathing, clear to auscultation bilaterally with no crackles, wheezes, or rhonchi Neuro: Grossly normal, moves all extremities Psych: Normal affect and thought content      Zaydah Nawabi M. Jerline Pain, MD 05/28/2020 2:58 PM

## 2020-05-28 NOTE — Assessment & Plan Note (Signed)
At goal. Continue coreg 18.75mg  twice daily, spironolactone 25mg  daily, and imdur 15mg  daily.

## 2020-06-11 ENCOUNTER — Ambulatory Visit (HOSPITAL_COMMUNITY)
Admission: RE | Admit: 2020-06-11 | Discharge: 2020-06-11 | Disposition: A | Discharge status: Home / Self Care | Payer: Medicare Other | Source: Ambulatory Visit | Attending: Cardiology | Admitting: Cardiology

## 2020-06-11 ENCOUNTER — Ambulatory Visit (INDEPENDENT_AMBULATORY_CARE_PROVIDER_SITE_OTHER): Payer: Medicare Other

## 2020-06-11 ENCOUNTER — Other Ambulatory Visit: Payer: Self-pay

## 2020-06-11 ENCOUNTER — Encounter (HOSPITAL_COMMUNITY): Payer: Self-pay | Admitting: Cardiology

## 2020-06-11 VITALS — BP 124/80 | HR 60 | Wt 226.2 lb

## 2020-06-11 DIAGNOSIS — F17210 Nicotine dependence, cigarettes, uncomplicated: Secondary | ICD-10-CM | POA: Diagnosis not present

## 2020-06-11 DIAGNOSIS — I2510 Atherosclerotic heart disease of native coronary artery without angina pectoris: Secondary | ICD-10-CM | POA: Insufficient documentation

## 2020-06-11 DIAGNOSIS — I480 Paroxysmal atrial fibrillation: Secondary | ICD-10-CM | POA: Insufficient documentation

## 2020-06-11 DIAGNOSIS — Z7984 Long term (current) use of oral hypoglycemic drugs: Secondary | ICD-10-CM | POA: Insufficient documentation

## 2020-06-11 DIAGNOSIS — I5022 Chronic systolic (congestive) heart failure: Secondary | ICD-10-CM

## 2020-06-11 DIAGNOSIS — N183 Chronic kidney disease, stage 3 unspecified: Secondary | ICD-10-CM | POA: Diagnosis not present

## 2020-06-11 DIAGNOSIS — E1122 Type 2 diabetes mellitus with diabetic chronic kidney disease: Secondary | ICD-10-CM | POA: Diagnosis not present

## 2020-06-11 DIAGNOSIS — Z79899 Other long term (current) drug therapy: Secondary | ICD-10-CM | POA: Insufficient documentation

## 2020-06-11 DIAGNOSIS — I4892 Unspecified atrial flutter: Secondary | ICD-10-CM | POA: Diagnosis not present

## 2020-06-11 DIAGNOSIS — E1140 Type 2 diabetes mellitus with diabetic neuropathy, unspecified: Secondary | ICD-10-CM

## 2020-06-11 DIAGNOSIS — I130 Hypertensive heart and chronic kidney disease with heart failure and stage 1 through stage 4 chronic kidney disease, or unspecified chronic kidney disease: Secondary | ICD-10-CM | POA: Diagnosis not present

## 2020-06-11 DIAGNOSIS — Z95810 Presence of automatic (implantable) cardiac defibrillator: Secondary | ICD-10-CM

## 2020-06-11 DIAGNOSIS — Z5181 Encounter for therapeutic drug level monitoring: Secondary | ICD-10-CM

## 2020-06-11 DIAGNOSIS — I483 Typical atrial flutter: Secondary | ICD-10-CM

## 2020-06-11 DIAGNOSIS — R0602 Shortness of breath: Secondary | ICD-10-CM | POA: Diagnosis not present

## 2020-06-11 DIAGNOSIS — I428 Other cardiomyopathies: Secondary | ICD-10-CM | POA: Insufficient documentation

## 2020-06-11 DIAGNOSIS — Z7901 Long term (current) use of anticoagulants: Secondary | ICD-10-CM | POA: Diagnosis not present

## 2020-06-11 DIAGNOSIS — Z7982 Long term (current) use of aspirin: Secondary | ICD-10-CM | POA: Diagnosis not present

## 2020-06-11 DIAGNOSIS — E1165 Type 2 diabetes mellitus with hyperglycemia: Secondary | ICD-10-CM

## 2020-06-11 DIAGNOSIS — IMO0002 Reserved for concepts with insufficient information to code with codable children: Secondary | ICD-10-CM

## 2020-06-11 LAB — LIPID PANEL
Cholesterol: 129 mg/dL (ref 0–200)
HDL: 27 mg/dL — ABNORMAL LOW (ref >40)
LDL Cholesterol: 88 mg/dL (ref 0–99)
Total CHOL/HDL Ratio: 4.8 RATIO
Triglycerides: 72 mg/dL (ref <150)
VLDL: 14 mg/dL (ref 0–40)

## 2020-06-11 LAB — BASIC METABOLIC PANEL
Anion gap: 13 (ref 5–15)
BUN: 31 mg/dL — ABNORMAL HIGH (ref 8–23)
CO2: 20 mmol/L — ABNORMAL LOW (ref 22–32)
Calcium: 9.3 mg/dL (ref 8.9–10.3)
Chloride: 107 mmol/L (ref 98–111)
Creatinine, Ser: 1.89 mg/dL — ABNORMAL HIGH (ref 0.61–1.24)
GFR, Estimated: 38 mL/min — ABNORMAL LOW (ref >60)
Glucose, Bld: 135 mg/dL — ABNORMAL HIGH (ref 70–99)
Potassium: 4.8 mmol/L (ref 3.5–5.1)
Sodium: 140 mmol/L (ref 135–145)

## 2020-06-11 LAB — CBC
HCT: 51.7 % (ref 39.0–52.0)
Hemoglobin: 16.4 g/dL (ref 13.0–17.0)
MCH: 28.4 pg (ref 26.0–34.0)
MCHC: 31.7 g/dL (ref 30.0–36.0)
MCV: 89.4 fL (ref 80.0–100.0)
Platelets: 134 10*3/uL — ABNORMAL LOW (ref 150–400)
RBC: 5.78 MIL/uL (ref 4.22–5.81)
RDW: 15.2 % (ref 11.5–15.5)
WBC: 6.6 10*3/uL (ref 4.0–10.5)
nRBC: 0 % (ref 0.0–0.2)

## 2020-06-11 LAB — POCT INR: INR: 3.1 — AB (ref 2.0–3.0)

## 2020-06-11 MED ORDER — DAPAGLIFLOZIN PROPANEDIOL 10 MG PO TABS: 10.0000 mg | ORAL_TABLET | Freq: Every day | ORAL | 3 refills | Status: DC

## 2020-06-11 NOTE — Progress Notes (Signed)
EPIC Encounter for ICM Monitoring  Patient Name: Anthony Rubio. is a 69 y.o. male Date: 06/11/2020 Primary Care Physican: Vivi Barrack, MD Primary Cardiologist:[facility] Electrophysiologist: Allred 6/18/2021Weight:227- 230lbs  Time in AT/AF<0.1 hr/day (<0.1%)(taking Warfarin)       Patient has OV with Dr Aundra Dubin today, 06/11/2020  Optivol thoracic impedancesuggestingnormal fluid levels.  Prescribed:Furosemide to 40 mg 1 tablet every AM and 0.5 tablet(20 mg total)every PM.   Labs:  BMET scheduled 06/11/20 05/15/2019 Creatinine 1.95, BUN 37, Potassium 4.9, Sodium 140, GFR 34-39 08/10/2018 Creatinine 1.75, BUN 32, Potassium 4.8, Sodium 140, GFR 47.01 01/03/2018 Creatinine 1.62, BUN 23, Potassium 4.5, Sodium 143, GFR 43-50 A complete set of results can be found in Results Review  Recommendations:Any recommendations will be given at [place] 06/11/2020.  Follow-up plan: ICM clinic phone appointment on1/04/2021. 91 day device clinic remote transmission1/31/2021.   EP/Cardiology Office Visits: Recall 05/14/2020 with Dr Rayann Heman.  Copy of ICM check sent to Dr.Allred.   3 month ICM trend: 06/11/2020    1 Year ICM trend:       [name], RN 06/11/2020 11:46 AM

## 2020-06-11 NOTE — Progress Notes (Signed)
Patient ID: Anthony Rubio., male   DOB: [birth year], 69 y.o.   MRN: [id number] PCP: Dr. Jerline Pain Cardiology: Dr. Aundra Dubin  69 y.o. with history of CAD, mixed ischemic/nonischemic cardiomyopathy, and diabetes who presents for followup. He initially was hospitalized in 5/11 with acute decompensated CHF after several days of shortness of breath, orthopnea, and PND. He was diuresed and left/right heart catheterization was done. Left heart cath showed large, ectatic coronaries with slow flow and moderate to severe diffuse distal vessel disease (consistent with diabetes) without good interventional options. EF was 15-20% by echo. He was put on milrinone and further diuresed for several more days. He was titrated off milrinone and discharged on an oral medication regimen. He later developed atrial flutter requiring cardioversion. He remains in NSR.  Patient had Medtronic ICD placed in 11/11.  Repeat echo in 10/12 showed EF 25%.  Most recent echo in 1/19 showed EF 25% with severe LV dilation. He was noted to be in atrial fibrillation again in 2/19.  He had TEE-guided DCCV in 2/19 back to NSR. He has not been able tolerate even 1/2 tab tid of Bidil due to lightheadedness.   He returns for followup of CHF.  He remains in NSR today.  He is not very active due to severe diabetic neuropathy, uses a wheelchair when he leaves the house, he uses a walker when walking around the house.  No dyspnea walking on flat ground.  He does get short of breath walking up stairs.  No recent palpitations and rare short AF runs on device interrogation.  No chest pain.  No orthopnea/PND. No lightheadedness.  Still smokes a pack every 3-4 days.    Medtronic device reviewed: Optivol with fluid index < threshold, rare short AF runs.   Labs (9/11): LDL 61, HDL 26, K 4.8, creatinine 1.6  Labs (10/11): K 4.3, creatinine 1.3, BNP 165  Labs (11/11): K 4.9, creatinine 1.6  Labs (12/11): K 4.8, creatinine 1.7  Labs (2/12): LDL 110, HDL 28  Labs  (3/12): K 4.1, creatinine 1.7 Labs (6/12): K 4.4, creatinine 1.4, BNP 125 Labs (10/12): K 4.8, creatinine 1.4 Labs (12/12): K 4.4, creatinine 1.5, BNP 20 Labs (3/13): K 5, creatinine 1.5, LDL 43, HDL 31 Labs (8/13): K 4.2, creatinine 1.3, BNP 158 Labs (05/03/14): K 4.6 Creatinine 1.4  Labs (9/16): K 4.4, creatinine 1.5 Labs (5/18): K 4.7, creatinine 1.63, pro-BNP 5249, hgb 14.4 Labs (12/18): K 4.5, creatinine 1.99, LDL 127, HDL 28, hgb 16.1 Labs (2/19): K 4.7, creatinine 2.46, LDL 78, HDL 26 Labs (11/20): K 4.9, creatinine 1.95  ECG (personally reviewed): a-paced, IVCD 138 msec  Allergies:  1) ! Erythromycin   Past Medical History:  1. CAD: LHC (5/11) with 50-60% mid LAD, diffuse moderate distal LAD up to 60-70%, small OM2 subtotally occluded, moderate to severe diffuse distal PLOM disease, severe diffuse distal RCA, PDA, and PLV disease. Coronaries were ectatic with slow flow. Patient was started on Plavix given concern for thrombus formation in the ectatic coronaries. He stopped Plavix after starting coumadin with atrial flutter.  2. Chronic systolic CHF: Likely mixed ischemic/nonischemic. Echo (5/11) with EF 15-20%, severe global hypokinesis, mild MR, moderate diastolic dysfunction, mildly decreased RV systolic function. RHC (5/11) with mean RA 14 mmHg, PA 38/26, mean PCWP 25 mmHg, CI 1.8. Repeat echo (8/11) with EF 38-10%, severe diastolic dysfunction, mild MR, RV normal size and systolic function. Per official read, cannot rule out apical clot but it does appear to be only trabeculations.  TEE (9/11): EF 15% with global hypokinesis, mild to moderate MR, no LV thrombus and no LAA thrombus.  Echo (10/12): EF 25%.  Echo (1/17) with EF 20-25%, severe LV dilation, mild aortic stenosis with AVA 1.5 cm^2, moderate MR, moderately dilated RV with moderately decreased RV systolic function, PASP 50 mmHg. Medtronic ICD.  - Echo (1/19): EF 25%, severe LV dilation, mild RV dilation with severely decreased  systolic function, mild-moderate MR.  - TEE (2/19): EF 25%, moderately dilated LV, mildly dilated RV with moderately decreased systolic function, moderate LAE/RAE, moderate TR, peak RV-RA gradient 56 mmHg, functionally bicuspid aortic valve without aortic stenosis.  3. Type II diabetes 4. HTN  5. Smoker  6. Severe diabetic neuropathy with chronic gait dysfunction  7. History of left foot ulcer with poor healing  8. Atrial flutter s/p DCCV 9/11  9. Aortic stenosis: Mild by echo in 1/17. 10. Atrial fibrillation: Paroxysmal.  - DCCV to NSR in 2/19.  11. CKD: Stage 3.  Suspect diabetic nephropathy.  12. Functionally bicuspid aortic valve.   Family History:  Family History of Colon CA 1st degree relative <60 9other relative)  Heart disease (grandparent)  Stroke (other relative)  dm only in secondary relatives   Social History:  Pt lives in [place]. 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    Current Outpatient Medications  Medication Sig Dispense Refill  . atorvastatin (LIPITOR) 80 MG tablet Take 1 tablet (80 mg total) by mouth daily. 90 tablet 1  . carvedilol (COREG) 6.25 MG tablet Take 3 tablets (18.75 mg total) by mouth 2 (two) times daily with a meal. 540 tablet 2  . furosemide (LASIX) 40 MG tablet Take 1 tablet (40 mg total) by mouth every morning AND 0.5 tablets (20 mg total) every evening. Needs appt for further refills. 135 tablet 0  . glipiZIDE (GLUCOTROL XL) 10 MG 24 hr tablet Take 1-2 tablets (10-20 mg total) by mouth daily with breakfast. Take 2 tablets if blood sugar > 200. 180 tablet 3  . isosorbide mononitrate (IMDUR) 30 MG 24 hr tablet Take 1/2 (one-half) tablet by mouth once daily 45 tablet 0  . Lancets (ACCU-CHEK SOFT TOUCH) lancets Use to test blood sugar once a day E11.9 100 each 3  . pantoprazole (PROTONIX) 40 MG tablet Take  20 mg by mouth daily as needed (heartburn).     Marland Kitchen spironolactone (ALDACTONE) 25 MG tablet Take 1 tablet (25 mg total) by mouth daily. 90 tablet 3  . warfarin (COUMADIN) 5 MG tablet TAKE 1 TO 1 & 1/2 (ONE & ONE-HALF) TABLETS BY MOUTH ONCE DAILY AS DIRECTED BY COUMADIN CLINIC FOR 90 DAYS 110 tablet 0  . dapagliflozin propanediol (FARXIGA) 10 MG TABS tablet Take 1 tablet (10 mg total) by mouth daily before breakfast. 30 tablet 3   No current facility-administered medications for this encounter.    BP 124/80   Pulse 60   Wt 102.6 kg (226 lb 3.2 oz)   SpO2 96%   BMI 28.27 kg/m  General: NAD Neck: No JVD, no thyromegaly or thyroid nodule.  Lungs: Clear to auscultation bilaterally with normal respiratory effort. CV: Nondisplaced PMI.  Heart regular S1/S2, no S3/S4, no murmur.  No peripheral edema.  No carotid bruit.  Normal pedal pulses.  Abdomen: Soft, nontender, no hepatosplenomegaly, no distention.  Skin: Intact without lesions or rashes.  Neurologic: Alert and oriented x 3.  Psych: Normal affect. Extremities: No clubbing or cyanosis.  HEENT: Normal.   Assessment/Plan:  1. Atrial fibrillation:  Prior atrial flutter in 2011.  He was noted to be in atrial fibrillation in 2/19 and underwent TEE-guided DCCV to NSR.   - I do not think that warfarin is a good anticoagulant for him (rarely therapeutic). However, he is not going to be able to afford a DOAC as he does not have Medicare prescription coverage.  Will see if we can get him on a DOAC through an assistance program.  - He may not stay in NSR long-term after cardioversion given cardiomyopathy and likely would be best served by Tikosyn or ablation (based on CASTLE-HF), but I am not sure that he would agree to either.  He is agreeable at this point to a referral to EP if prolonged atrial fibrillation recurs.  2. CKD stage 3: Suspect diabetic nephropathy and neuropathy.  - BMET today.   3. Chronic systolic heart failure: Mixed  ischemic/nonischemic cardiomyopathy. Has Medtronic ICD.  Last echo in 1/19 with EF 25%, severely dilated LV, severely decreased RV systolic function. He has significant distal vessel CAD on prior cath as well as a history of poorly controlled diabetes. He has NYHA class II-III symptoms though not very active due to severe peripheral neuropathy. Today, he is not volume overloaded on exam or by Optivol.  He has had a hard time tolerating medications due to orthostatic symptoms and also with baseline CKD stage 3.  - Continue Coreg 18.75 mg bid.  - He was unable to tolerate even low dose losartan and creatinine has been elevated. Will not retry ARB or ARNI at this time.  - Unable to tolerate Bidil 1/2 tab tid due to lightheadedness.  - Continue spironolactone 25 mg daily.  - Continue Lasix 40 qam/20 qpm.  BMET today.   - I would like him to start Farxiga 10 mg daily.  We should be able to get this through an assistance program.  BMET in 10 days.  - I will obtain echo to reassess LV systolic function.  4. CAD: Diffuse moderate to severe distal vessel disease on last cath with ectatic coronaries with slow flow. No interventional target.  No ischemic chest pain.  - Continue ASA 81 daily.  - Continue atorvastatin and check lipids today.   5. Smoking: I strongly encouraged him to quit smoking.   Followup in 3-4 months with APP.   [name] 06/11/2020

## 2020-06-11 NOTE — Patient Instructions (Addendum)
START Farxiga 10mg  (1 tablet) Daily  Labs done today, your results will be available in MyChart, we will contact you for abnormal readings.  Your physician recommends that you schedule repeat labs in 10 days  Your physician has requested that you have an echocardiogram. Echocardiography is a painless test that uses sound waves to create images of your heart. It provides your doctor with information about the size and shape of your heart and how well your heart's chambers and valves are working. This procedure takes approximately one hour. There are no restrictions for this procedure.  Your physician recommends that you schedule a follow-up appointment in: 3 months  If you have any questions or concerns before your next appointment please send [place] a message through [place] or call our office at [phone].    TO LEAVE A MESSAGE FOR THE NURSE SELECT OPTION 2, PLEASE LEAVE A MESSAGE INCLUDING: . YOUR NAME . DATE OF BIRTH . CALL BACK NUMBER . REASON FOR CALL**this is important as we prioritize the call backs  YOU WILL RECEIVE A CALL BACK THE SAME DAY AS LONG AS YOU CALL BEFORE 4:00 PM

## 2020-06-11 NOTE — Progress Notes (Signed)
CSW consulted to meet with pt regarding lack of prescription insurance.  CSW met with pt and pt wife.  Helped to enter information into Medicare website to determine estimated prescription and premium costs- per patient estimated costs are too expensive (would be close to $3000/year).  Patient would rather apply for patient assistance programs.  Pt already completed farxiga app with pharmacist but states he is also being started on xarelto- CSW helped to complete this application with pt.  Will continue to follow and assist as needed  Jorge Ny, [facility] Desk#: [phone] Cell#: [phone]

## 2020-06-11 NOTE — Patient Instructions (Signed)
Description   Take 1/2 tablet today and then continue on same dosage 1 tablet daily except for 1.5 tablets on Fridays.  Recheck INR in 5 weeks follow-up, aware of risks. Call for any questions or problems [phone].

## 2020-06-12 ENCOUNTER — Telehealth (HOSPITAL_COMMUNITY): Payer: Self-pay | Admitting: Pharmacy Technician

## 2020-06-12 DIAGNOSIS — Z23 Encounter for immunization: Secondary | ICD-10-CM | POA: Diagnosis not present

## 2020-06-12 NOTE — Telephone Encounter (Signed)
Patient was started on Farxiga and application for AZ&Me was sent in via fax.  Will follow up.

## 2020-06-14 ENCOUNTER — Telehealth (HOSPITAL_COMMUNITY): Payer: Self-pay | Admitting: Licensed Clinical Social Worker

## 2020-06-14 NOTE — Telephone Encounter (Signed)
Completed Wynetta Emery and [employer] Patient Assistance application for Xarelto faxed in for review- fax confirmation received- awaiting determination  Jorge Ny, [facility] Desk#: [phone] Cell#: [phone]

## 2020-06-17 ENCOUNTER — Telehealth (HOSPITAL_COMMUNITY): Payer: Self-pay

## 2020-06-17 DIAGNOSIS — Z95810 Presence of automatic (implantable) cardiac defibrillator: Secondary | ICD-10-CM

## 2020-06-17 DIAGNOSIS — I5022 Chronic systolic (congestive) heart failure: Secondary | ICD-10-CM

## 2020-06-17 MED ORDER — FUROSEMIDE 40 MG PO TABS: ORAL_TABLET | ORAL | 3 refills | Status: DC

## 2020-06-17 MED ORDER — EZETIMIBE 10 MG PO TABS: 10.0000 mg | ORAL_TABLET | Freq: Every day | ORAL | 11 refills | Status: DC

## 2020-06-17 NOTE — Telephone Encounter (Signed)
Patient advised and verbalized understanding. Patient will make 43m lab appt at a later date. New Rx sent into patients pharmacy.

## 2020-06-17 NOTE — Telephone Encounter (Signed)
-----   Message from Larey Dresser, MD sent at 06/12/2020  4:11 PM EST ----- LDL mildly higher than goal, would have him start Zetia 10 mg daily with lipids/LFTs in 2 months.

## 2020-06-21 ENCOUNTER — Other Ambulatory Visit: Payer: Self-pay

## 2020-06-21 ENCOUNTER — Other Ambulatory Visit (HOSPITAL_COMMUNITY): Payer: Medicare Other

## 2020-06-21 ENCOUNTER — Ambulatory Visit (HOSPITAL_COMMUNITY)
Admission: RE | Admit: 2020-06-21 | Discharge: 2020-06-21 | Disposition: A | Discharge status: Home / Self Care | Payer: Medicare Other | Source: Ambulatory Visit | Attending: Cardiology | Admitting: Cardiology

## 2020-06-21 DIAGNOSIS — I083 Combined rheumatic disorders of mitral, aortic and tricuspid valves: Secondary | ICD-10-CM | POA: Insufficient documentation

## 2020-06-21 DIAGNOSIS — I5022 Chronic systolic (congestive) heart failure: Secondary | ICD-10-CM | POA: Insufficient documentation

## 2020-06-21 DIAGNOSIS — E119 Type 2 diabetes mellitus without complications: Secondary | ICD-10-CM | POA: Insufficient documentation

## 2020-06-21 DIAGNOSIS — Z95810 Presence of automatic (implantable) cardiac defibrillator: Secondary | ICD-10-CM | POA: Insufficient documentation

## 2020-06-21 DIAGNOSIS — I4892 Unspecified atrial flutter: Secondary | ICD-10-CM | POA: Diagnosis not present

## 2020-06-21 DIAGNOSIS — I2510 Atherosclerotic heart disease of native coronary artery without angina pectoris: Secondary | ICD-10-CM | POA: Diagnosis not present

## 2020-06-21 DIAGNOSIS — I110 Hypertensive heart disease with heart failure: Secondary | ICD-10-CM | POA: Diagnosis not present

## 2020-06-21 LAB — ECHOCARDIOGRAM COMPLETE
Area-P 1/2: 3.89 cm2
Calc EF: 18.7 %
S' Lateral: 6 cm
Single Plane A2C EF: 24.3 %
Single Plane A4C EF: 13.9 %

## 2020-06-21 NOTE — Progress Notes (Signed)
  Echocardiogram 2D Echocardiogram has been performed.  Anthony Rubio 06/21/2020, 9:00 AM

## 2020-07-15 ENCOUNTER — Ambulatory Visit (INDEPENDENT_AMBULATORY_CARE_PROVIDER_SITE_OTHER): Payer: Medicare Other

## 2020-07-15 DIAGNOSIS — I5022 Chronic systolic (congestive) heart failure: Secondary | ICD-10-CM | POA: Diagnosis not present

## 2020-07-15 DIAGNOSIS — Z95810 Presence of automatic (implantable) cardiac defibrillator: Secondary | ICD-10-CM | POA: Diagnosis not present

## 2020-07-16 ENCOUNTER — Other Ambulatory Visit: Payer: Self-pay

## 2020-07-16 ENCOUNTER — Ambulatory Visit (INDEPENDENT_AMBULATORY_CARE_PROVIDER_SITE_OTHER): Payer: Medicare Other | Admitting: *Deleted

## 2020-07-16 DIAGNOSIS — Z5181 Encounter for therapeutic drug level monitoring: Secondary | ICD-10-CM

## 2020-07-16 DIAGNOSIS — I483 Typical atrial flutter: Secondary | ICD-10-CM

## 2020-07-16 LAB — POCT INR: INR: 2.3 (ref 2.0–3.0)

## 2020-07-16 NOTE — Patient Instructions (Signed)
Description   Continue on same dosage 1 tablet daily except for 1.5 tablets on Fridays.  Recheck INR in 5 weeks. Call for any questions or problems [phone].

## 2020-07-17 ENCOUNTER — Encounter (HOSPITAL_COMMUNITY): Payer: Medicare Other | Admitting: Cardiology

## 2020-07-17 ENCOUNTER — Other Ambulatory Visit (HOSPITAL_COMMUNITY): Payer: Self-pay | Admitting: Cardiology

## 2020-07-17 NOTE — Progress Notes (Signed)
EPIC Encounter for ICM Monitoring  Patient Name: Anthony Rubio. is a 70 y.o. male Date: 07/17/2020 Primary Care Physican: Vivi Barrack, MD Primary Cardiologist:[facility] Electrophysiologist: Allred LastWeight:227- 230lbs  Time in AT/AF<0.1 hr/day (<0.1%)(taking Warfarin)    Spoke with patient and reports feeling well at this time.  Denies fluid symptoms.    Optivol thoracic impedancesuggestingnormal fluid levels.  Prescribed:Furosemide to 40 mg 1 tablet every AM and 0.5 tablet(20 mg total)every PM.   Labs:   06/11/2020 Creatinine 1.89, BUN 31, Potassium 4.8, Sodium 140, GFR 38 05/15/2019 Creatinine 1.95, BUN 37, Potassium 4.9, Sodium 140, GFR 34-39 08/10/2018 Creatinine 1.75, BUN 32, Potassium 4.8, Sodium 140, GFR 47.01 01/03/2018 Creatinine 1.62, BUN 23, Potassium 4.5, Sodium 143, GFR 43-50 A complete set of results can be found in Results Review  Recommendations:No changes and encouraged to call if experiencing any fluid symptoms.  Follow-up plan: ICM clinic phone appointment on2/21/2022. 91 day device clinic remote transmission1/31/2022.   EP/Cardiology Office Visits:09/09/2020 with HF clinic. Recall 05/14/2020 with Dr Rayann Heman.  Copy of ICM check sent to Dr.Allred.  3 month ICM trend: 07/15/2020.    1 Year ICM trend:       [name], RN 07/17/2020 12:06 PM

## 2020-07-19 ENCOUNTER — Ambulatory Visit: Payer: Medicare Other | Admitting: Physician Assistant

## 2020-07-24 ENCOUNTER — Other Ambulatory Visit: Payer: Self-pay | Admitting: Cardiology

## 2020-08-05 ENCOUNTER — Ambulatory Visit (INDEPENDENT_AMBULATORY_CARE_PROVIDER_SITE_OTHER): Payer: Medicare Other

## 2020-08-05 DIAGNOSIS — I429 Cardiomyopathy, unspecified: Secondary | ICD-10-CM

## 2020-08-06 ENCOUNTER — Telehealth: Payer: Self-pay | Admitting: Emergency Medicine

## 2020-08-06 LAB — CUP PACEART REMOTE DEVICE CHECK
Battery Remaining Longevity: 96 mo
Battery Voltage: 2.97 V
Brady Statistic AP VP Percent: 1.1 %
Brady Statistic AP VS Percent: 76.87 %
Brady Statistic AS VP Percent: 1.74 %
Brady Statistic AS VS Percent: 20.29 %
Brady Statistic RA Percent Paced: 76.43 %
Brady Statistic RV Percent Paced: 2.91 %
Date Time Interrogation Session: 20220201093526
HighPow Impedance: 43 Ohm
HighPow Impedance: 51 Ohm
Implantable Lead Implant Date: 20111108
Implantable Lead Implant Date: 20111108
Implantable Lead Location: 753859
Implantable Lead Location: 753860
Implantable Lead Model: 5076
Implantable Lead Model: 6947
Implantable Pulse Generator Implant Date: 20190730
Lead Channel Impedance Value: 285 Ohm
Lead Channel Impedance Value: 361 Ohm
Lead Channel Impedance Value: 475 Ohm
Lead Channel Pacing Threshold Amplitude: 0.625 V
Lead Channel Pacing Threshold Amplitude: 0.75 V
Lead Channel Pacing Threshold Pulse Width: 0.4 ms
Lead Channel Pacing Threshold Pulse Width: 0.4 ms
Lead Channel Sensing Intrinsic Amplitude: 0.75 mV
Lead Channel Sensing Intrinsic Amplitude: 0.75 mV
Lead Channel Sensing Intrinsic Amplitude: 6 mV
Lead Channel Sensing Intrinsic Amplitude: 6 mV
Lead Channel Setting Pacing Amplitude: 2 V
Lead Channel Setting Pacing Amplitude: 2.5 V
Lead Channel Setting Pacing Pulse Width: 0.4 ms
Lead Channel Setting Sensing Sensitivity: 0.3 mV

## 2020-08-06 NOTE — Telephone Encounter (Signed)
Received alert that patient presenting EGM was AFL . Patient is asymptomatic. Will send follow-up transmission 08/07/20 .

## 2020-08-07 NOTE — Telephone Encounter (Signed)
Patient called back states he was asked to send a remote transmission and he is having issues with monitor. Patient is going to call tech support to get additional help since his handheld wont charge. Patient will call back and let us know what they say

## 2020-08-07 NOTE — Telephone Encounter (Signed)
The pt states [employer] is sending him a new handheld in 7-10 business days.

## 2020-08-12 NOTE — Telephone Encounter (Signed)
Follow up transmission received 08/12/20. Patient remains in AF w/ controlled rates. Increase AF burden from 0 % on 07/15/20 to 100% today 08/01/20. Reports increased fatigue, denies any other complaints. Reports compliance with all medications including + OAC coumadin 5 mg BID, Lasix 40 mg every morning and 20 mg every evening, coreg 18.75 mg TID. Advised I will forward to Dr. Rayann Heman and we will call with any changes. Verbalized understanding.

## 2020-08-14 NOTE — Progress Notes (Signed)
Remote ICD transmission.   

## 2020-08-16 NOTE — Telephone Encounter (Signed)
Advanced Heart Failure Patient Advocate Encounter   Patient was approved to receive Farxiga from AZ&Me  Patient ID: [id number] Effective dates: 06/19/20 through 06/18/21  Charlann Boxer, CPhT

## 2020-08-18 NOTE — Telephone Encounter (Signed)
Overdue to see EP  Please make appointment with Oda Kilts at next available time.

## 2020-08-20 ENCOUNTER — Other Ambulatory Visit: Payer: Self-pay

## 2020-08-20 ENCOUNTER — Ambulatory Visit (INDEPENDENT_AMBULATORY_CARE_PROVIDER_SITE_OTHER): Payer: Medicare Other | Admitting: Pharmacist

## 2020-08-20 DIAGNOSIS — Z5181 Encounter for therapeutic drug level monitoring: Secondary | ICD-10-CM

## 2020-08-20 DIAGNOSIS — I483 Typical atrial flutter: Secondary | ICD-10-CM

## 2020-08-20 LAB — POCT INR: INR: 5.6 — AB (ref 2.0–3.0)

## 2020-08-20 NOTE — Patient Instructions (Signed)
Hold warfarin for 3 doses then continue on same dosage 1 tablet daily except for 1.5 tablets on Fridays.  Recheck INR in 2 week. Call for any questions or problems [phone].

## 2020-08-21 NOTE — Telephone Encounter (Signed)
Attempted to contact patient. Mailbox full.

## 2020-08-26 ENCOUNTER — Ambulatory Visit (INDEPENDENT_AMBULATORY_CARE_PROVIDER_SITE_OTHER): Payer: Medicare Other

## 2020-08-26 DIAGNOSIS — Z95810 Presence of automatic (implantable) cardiac defibrillator: Secondary | ICD-10-CM

## 2020-08-26 DIAGNOSIS — I5022 Chronic systolic (congestive) heart failure: Secondary | ICD-10-CM

## 2020-08-28 NOTE — Progress Notes (Signed)
EPIC Encounter for ICM Monitoring  Patient Name: Tzuriel Boehnke. is a 70 y.o. male Date: 08/28/2020 Primary Care Physican: Vivi Barrack, MD Primary Cardiologist:[facility] Electrophysiologist: Allred 08/28/2020 HJ:[id number]- 230lbs  Clinical Status (11-Aug-2020 to 26-Aug-2020) AT/AF 1 episode  Time in AT/AF 24.0 hr/day (100.0%) (taking Warfarin)    Observations (4) (11-Aug-2020 to 26-Aug-2020)  TriageHFT (HF Risk) Alert: High (New)  AT/AF >= 6 hr for 16 days.  Possible OptiVol fluid accumulation: 25-Aug-2020 -- ongoing.  Patient Activity less than 1 hr/day for 2 weeks.    Spoke with patient and reports due to having diarrhea he increased fluid intake.  He no longer is having any GI issues and advised to return to 64 oz fluid intake daily.  Confirms he is compliant with taking Furosemide as prescribed.   Optivol thoracic impedancesuggestingpossible fluid accumulation starting at the same time as AT/AF burden increase.   Increased Burden has been addressed by device clinic nurses and Dr Rayann Heman.  EP scheduler attempted to contact patient on 2/16 to set up an EP appointment, at the request of Dr Rayann Heman, with Oda Kilts, PA since patient is over due and increase in burden.  Prescribed:Furosemide to 40 mg 1 tablet every AM and 0.5 tablet(20 mg total)every PM.   Labs: 06/11/2020 Creatinine 1.89, BUN 31, Potassium 4.8, Sodium 140, GFR 38 05/15/2019 Creatinine 1.95, BUN 37, Potassium 4.9, Sodium 140, GFR 34-39 08/10/2018 Creatinine 1.75, BUN 32, Potassium 4.8, Sodium 140, GFR 47.01 01/03/2018 Creatinine 1.62, BUN 23, Potassium 4.5, Sodium 143, GFR 43-50 A complete set of results can be found in Results Review  Recommendations:Recommendation to limit fluid intake to 64 oz daily.  Encouraged to call if experiencing any fluid symptoms.  Message sent to EP scheduler to call patient to make an appointment with Oda Kilts, PA.  Follow-up plan: ICM clinic phone  appointment on 09/03/2020 to recheck fluid lev els. 91 day device clinic remote transmission5/08/2020.   EP/Cardiology Office Visits:09/09/2020 with HF clinic. Recall 05/14/2020 with Dr Rayann Heman.  Copy of ICM check sent to Dr.Allred.  3 month ICM trend: 08/26/2020.    1 Year ICM trend:       [name], RN 08/28/2020 8:53 AM

## 2020-09-03 ENCOUNTER — Ambulatory Visit (INDEPENDENT_AMBULATORY_CARE_PROVIDER_SITE_OTHER): Payer: Medicare Other

## 2020-09-03 ENCOUNTER — Other Ambulatory Visit: Payer: Self-pay

## 2020-09-03 DIAGNOSIS — E1159 Type 2 diabetes mellitus with other circulatory complications: Secondary | ICD-10-CM | POA: Diagnosis not present

## 2020-09-03 DIAGNOSIS — I429 Cardiomyopathy, unspecified: Secondary | ICD-10-CM

## 2020-09-03 DIAGNOSIS — I152 Hypertension secondary to endocrine disorders: Secondary | ICD-10-CM

## 2020-09-03 DIAGNOSIS — I5022 Chronic systolic (congestive) heart failure: Secondary | ICD-10-CM

## 2020-09-03 DIAGNOSIS — Z5181 Encounter for therapeutic drug level monitoring: Secondary | ICD-10-CM

## 2020-09-03 DIAGNOSIS — I483 Typical atrial flutter: Secondary | ICD-10-CM

## 2020-09-03 DIAGNOSIS — Z95810 Presence of automatic (implantable) cardiac defibrillator: Secondary | ICD-10-CM

## 2020-09-03 LAB — CBC
Hematocrit: 44.1 % (ref 37.5–51.0)
Hemoglobin: 14.9 g/dL (ref 13.0–17.7)
MCH: 29.2 pg (ref 26.6–33.0)
MCHC: 33.8 g/dL (ref 31.5–35.7)
MCV: 86 fL (ref 79–97)
Platelets: 144 10*3/uL — ABNORMAL LOW (ref 150–450)
RBC: 5.11 x10E6/uL (ref 4.14–5.80)
RDW: 17.2 % — ABNORMAL HIGH (ref 11.6–15.4)
WBC: 5.5 10*3/uL (ref 3.4–10.8)

## 2020-09-03 LAB — PROTIME-INR
INR: >10 (ref 0.9–1.2)
Prothrombin Time: 101.9 s — ABNORMAL HIGH (ref 9.1–12.0)

## 2020-09-03 LAB — POCT INR: INR: 8 — AB (ref 2.0–3.0)

## 2020-09-03 NOTE — Patient Instructions (Addendum)
  Description   INR >8.0, Pt has not taken today's dosage of Warfarin, aware to hold today's dosage of Warfarin, sent for STAT lab will call with dosage instructions once results back from lab.  Go to ED with bleeding problems. Called spoke with pt, advised him per Marcelle Overlie, pharmacist to eat at least 1/2 a can of Spinach which she said is about the equivalent to 2.'5mg'$  of vit K.  Pt states he could definitely do that, he loves greens.  Will also hold Warfarin until recheck on Friday.  Call for any questions or problems [phone].

## 2020-09-04 NOTE — Progress Notes (Signed)
EPIC Encounter for ICM Monitoring  Patient Name: Anthony Rubio. is a 70 y.o. male Date: 09/04/2020 Primary Care Physican: Vivi Barrack, MD Primary Cardiologist:[place] Electrophysiologist: Rayann Heman 08/28/2020 [id number]:[id number]  Since 26-Aug-2020 Time in AT/AF 24.0 hr/day (100.0%) Longest AT/AF 29 days    Spoke with patient and reports feeling well at this time.  Denies fluid symptoms.  He reports he going to test for COVID today.  INR is >10 and will have it rechecked on 3/4  Optivol thoracic impedancesuggestingpossible fluid accumulation starting at the same time as AT/AF burden increase.   Increased Burden has been addressed by device clinic nurses and Dr Rayann Heman.  Pt has follow up appointment with Oda Kilts, PA.  Prescribed:Furosemide to 40 mg 1 tablet every AM and 0.5 tablet(20 mg total)every PM.   Labs: 09/03/2020 INR >10.0 and addressed by coumadin clinic 06/11/2020 Creatinine 1.89, BUN 31, Potassium 4.8, Sodium 140, GFR 38 05/15/2019 Creatinine 1.95, BUN 37, Potassium 4.9, Sodium 140, GFR 34-39 08/10/2018 Creatinine 1.75, BUN 32, Potassium 4.8, Sodium 140, GFR 47.01 01/03/2018 Creatinine 1.62, BUN 23, Potassium 4.5, Sodium 143, GFR 43-50 A complete set of results can be found in Results Review  Recommendations: Recommendation to limit salt intake and fluid intake to 64 oz daily.  Encouraged to call if experiencing any fluid symptoms.   Follow-up plan: ICM clinic phone appointment on 09/30/2020 since fluid levels will be checked weekly for next 2 weeks at [place]. 91 day device clinic remote transmission5/08/2020.   EP/Cardiology Office Visits:09/09/2020 with HF clinic.09/17/2020 with Oda Kilts, PA.  Copy of ICM check sent to Dr.Allred and Dr Aundra Dubin.  3 month ICM trend: 09/03/2020.    1 Year ICM trend:       [name], RN 09/04/2020 2:13 PM

## 2020-09-06 ENCOUNTER — Other Ambulatory Visit: Payer: Self-pay

## 2020-09-06 ENCOUNTER — Ambulatory Visit (INDEPENDENT_AMBULATORY_CARE_PROVIDER_SITE_OTHER): Payer: Medicare Other | Admitting: *Deleted

## 2020-09-06 DIAGNOSIS — I483 Typical atrial flutter: Secondary | ICD-10-CM | POA: Diagnosis not present

## 2020-09-06 DIAGNOSIS — Z5181 Encounter for therapeutic drug level monitoring: Secondary | ICD-10-CM | POA: Diagnosis not present

## 2020-09-06 LAB — POCT INR: INR: 3.3 — AB (ref 2.0–3.0)

## 2020-09-06 NOTE — Progress Notes (Signed)
Patient ID: Anthony Rubio., male   DOB: Nov 23, 1950, 70 y.o.   MRN: [id number] PCP: Dr. Jerline Pain Cardiology: Dr. Aundra Dubin  70 y.o. with history of CAD, mixed ischemic/nonischemic cardiomyopathy, and diabetes who presents for followup. He initially was hospitalized in 5/11 with acute decompensated CHF after several days of shortness of breath, orthopnea, and PND. He was diuresed and left/right heart catheterization was done. Left heart cath showed large, ectatic coronaries with slow flow and moderate to severe diffuse distal vessel disease (consistent with diabetes) without good interventional options. EF was 15-20% by echo. He was put on milrinone and further diuresed for several more days. He was titrated off milrinone and discharged on an oral medication regimen. He later developed atrial flutter requiring cardioversion. He remains in NSR.  Patient had Medtronic ICD placed in 11/11.  Repeat echo in 10/12 showed EF 25%.  Most recent echo in 1/19 showed EF 25% with severe LV dilation. He was noted to be in atrial fibrillation again in 2/19.  He had TEE-guided DCCV in 2/19 back to NSR. He has not been able tolerate even 1/2 tab tid of Bidil due to lightheadedness.   Today he returns for HF follow up. Last visit (12/21) he remained in NSR, still smoking a pack every 3-4 days, Wilder Glade was started. Now, overall feeling poor. Had a week of diarrhea in Feb and has since been quite fatigued with minimal activity. Feels his taste if off. Ankles are more swollen.  Feels he is not urinating as much. Sleeps on 3 pillows at night. Denies increasing SOB, CP, dizziness.  Appetite ok. No fever or chills. He does not weigh at home. Taking all medications.   Medtronic device reviewed: Optivol with fluid index > threshold, afib since Aug 05, 2020, mostly V-pacing since 1/22, no VT/VF (personally reviewed).   ECG (personally reviewed): atrial fibrillation (67 bpm), qrs 489 ms  Labs (9/11): LDL 61, HDL 26, K 4.8, creatinine 1.6   Labs (10/11): K 4.3, creatinine 1.3, BNP 165  Labs (11/11): K 4.9, creatinine 1.6  Labs (12/11): K 4.8, creatinine 1.7  Labs (2/12): LDL 110, HDL 28  Labs (3/12): K 4.1, creatinine 1.7 Labs (6/12): K 4.4, creatinine 1.4, BNP 125 Labs (10/12): K 4.8, creatinine 1.4 Labs (12/12): K 4.4, creatinine 1.5, BNP 20 Labs (3/13): K 5, creatinine 1.5, LDL 43, HDL 31 Labs (8/13): K 4.2, creatinine 1.3, BNP 158 Labs (05/03/14): K 4.6 Creatinine 1.4  Labs (9/16): K 4.4, creatinine 1.5 Labs (5/18): K 4.7, creatinine 1.63, pro-BNP 5249, hgb 14.4 Labs (12/18): K 4.5, creatinine 1.99, LDL 127, HDL 28, hgb 16.1 Labs (2/19): K 4.7, creatinine 2.46, LDL 78, HDL 26 Labs (11/20): K 4.9, creatinine 1.95 Labs (12/21): K 4.8, creatinine 1.89, hgb 16.4, LDL 88 HDL 27  Allergies:  1) Erythromycin   Past Medical History:  1. CAD: LHC (5/11) with 50-60% mid LAD, diffuse moderate distal LAD up to 60-70%, small OM2 subtotally occluded, moderate to severe diffuse distal PLOM disease, severe diffuse distal RCA, PDA, and PLV disease. Coronaries were ectatic with slow flow. Patient was started on Plavix given concern for thrombus formation in the ectatic coronaries. He stopped Plavix after starting coumadin with atrial flutter.  2. Chronic systolic CHF: Likely mixed ischemic/nonischemic. Echo (5/11) with EF 15-20%, severe global hypokinesis, mild MR, moderate diastolic dysfunction, mildly decreased RV systolic function. RHC (5/11) with mean RA 14 mmHg, PA 38/26, mean PCWP 25 mmHg, CI 1.8. Repeat echo (8/11) with EF 0000000, severe diastolic dysfunction,  mild MR, RV normal size and systolic function. Per official read, cannot rule out apical clot but it does appear to be only trabeculations. TEE (9/11): EF 15% with global hypokinesis, mild to moderate MR, no LV thrombus and no LAA thrombus.  Echo (10/12): EF 25%.  Echo (1/17) with EF 20-25%, severe LV dilation, mild aortic stenosis with AVA 1.5 cm^2, moderate MR, moderately  dilated RV with moderately decreased RV systolic function, PASP 50 mmHg. Medtronic ICD.  - Echo (1/19): EF 25%, severe LV dilation, mild RV dilation with severely decreased systolic function, mild-moderate MR.  - TEE (2/19): EF 25%, moderately dilated LV, mildly dilated RV with moderately decreased systolic function, moderate LAE/RAE, moderate TR, peak RV-RA gradient 56 mmHg, functionally bicuspid aortic valve without aortic stenosis.  - Echo (12/21): EF<20%, severe LV dysfunction, RV severely decreased, RVSP 43 mmHg, severe bi-atrial dilation, mild/mod MR/TR.   3. Type II diabetes 4. HTN  5. Smoker  6. Severe diabetic neuropathy with chronic gait dysfunction  7. History of left foot ulcer with poor healing  8. Atrial flutter s/p DCCV 9/11  9. Aortic stenosis: Mild by echo in 1/17. 10. Atrial fibrillation: Paroxysmal.  - DCCV to NSR in 2/19.  11. CKD: Stage 3.  Suspect diabetic nephropathy.  12. Functionally bicuspid aortic valve.   Family History:  Family History of Colon CA 1st degree relative <60 9other relative)  Heart disease (grandparent)  Stroke (other relative)  dm only in secondary relatives   Social History:  Pt lives in [place]. 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   Current Outpatient Medications  Medication Sig Dispense Refill  . atorvastatin (LIPITOR) 80 MG tablet Take 1 tablet (80 mg total) by mouth daily. 90 tablet 1  . carvedilol (COREG) 6.25 MG tablet Take 3 tablets (18.75 mg total) by mouth 2 (two) times daily with a meal. 540 tablet 2  . dapagliflozin propanediol (FARXIGA) 10 MG TABS tablet Take 1 tablet (10 mg total) by mouth daily before breakfast. 30 tablet 3  . ezetimibe (ZETIA) 10 MG tablet Take 1 tablet (10 mg total) by mouth daily. 30 tablet 11  . furosemide (LASIX) 40 MG tablet Take 1 tablet (40 mg total) by  mouth every morning AND 0.5 tablets (20 mg total) every evening. Needs appt for further refills. 135 tablet 3  . glipiZIDE (GLUCOTROL XL) 10 MG 24 hr tablet Take 1-2 tablets (10-20 mg total) by mouth daily with breakfast. Take 2 tablets if blood sugar > 200. 180 tablet 3  . isosorbide mononitrate (IMDUR) 30 MG 24 hr tablet Take 1/2 (one-half) tablet by mouth once daily 45 tablet 0  . Lancets (ACCU-CHEK SOFT TOUCH) lancets Use to test blood sugar once a day E11.9 100 each 3  . pantoprazole (PROTONIX) 40 MG tablet Take 20 mg by mouth daily as needed (heartburn).     Marland Kitchen spironolactone (ALDACTONE) 25 MG tablet Take 1 tablet (25 mg total) by mouth daily. 90 tablet 3  . warfarin (COUMADIN) 5 MG tablet TAKE 1 TO 1 & 1/2 (ONE & ONE-HALF) TABLETS BY MOUTH ONCE DAILY AS DIRECTED BY  COUMADIN  CLINIC 110 tablet 0   No current facility-administered medications for this encounter.   Wt Readings from Last 3 Encounters:  09/09/20 106.9 kg (235 lb 9.6 oz)  06/11/20 102.6 kg (226 lb 3.2 oz)  05/28/20 103.2 kg (227 lb 9.6 oz)   BP 100/80   Pulse 62   Wt 106.9 kg (235 lb 9.6 oz)   SpO2 93%   BMI 29.45 kg/m  General:  NAD. No resp difficulty. Arrived in wheelchair HEENT: Normal Neck: Supple. JVP 8-9. Carotids 2+ bilat; no bruits. No lymphadenopathy or thryomegaly appreciated. Cor: PMI nondisplaced. Irregular rate & rhythm. Distant heart tones. No rubs, gallops or murmurs. Lungs: Clear, diminished in bases. Abdomen: Nontender, +distended. No hepatosplenomegaly. No bruits or masses. Good bowel sounds. Extremities: No cyanosis, clubbing, rash, 1-2+ LE edema Neuro: Alert & oriented x 3, cranial nerves grossly intact. Moves all 4 extremities w/o difficulty. Affect pleasant.  Assessment/Plan:  1. Atrial fibrillation:  Prior atrial flutter in 2011.  He was noted to be in atrial fibrillation in 2/19 and underwent TEE-guided DCCV to NSR.   - I do not think that warfarin is a good anticoagulant for him (rarely  therapeutic). However, he is not going to be able to afford a DOAC as he does not have Medicare prescription coverage.  Will see if we can get him on a DOAC through an assistance program.  - He may not stay in NSR long-term after cardioversion given cardiomyopathy and likely would be best served by Tikosyn or ablation (based on CASTLE-HF), but I am not sure that he would agree to either. Consider referral to EP. - He is in atrial fibrillation today. Per device interrogation, appears this started at the end of January. Other than fatigue, patient has been asymptomatic.  - INR's have been supra therapeutic recently, but last INR 3.3 - Discussed with Dr. Aundra Dubin, schedule for DCCV this week. 2. CKD stage 3: Suspect diabetic nephropathy and neuropathy.  - BMET today.   3. Chronic systolic heart failure: Mixed ischemic/nonischemic cardiomyopathy. Has Medtronic ICD.  Echo in 1/19 with EF 25%, severely dilated LV, severely decreased RV systolic function. He has significant distal vessel CAD on prior cath as well as a history of poorly controlled diabetes. Echo (12/21) EF worse <20%. He has NYHA class III-IIIb symptoms, though not very active due to severe peripheral neuropathy. Today, he is volume overloaded on exam, weight up 10 lbs, and by OptiVol.  He has had a hard time tolerating medications due to orthostatic symptoms and also with baseline CKD stage 3.  - Stop lasix. Start torsemide 60 mg daily. (personally discussed with Dr. Aundra Dubin) - Continue Coreg 18.75 mg bid.  - Continue spironolactone 25 mg daily.  - Start Farxiga 10 mg daily, never started medication after last visit.  (approved for AZ&ME, just needs to call for Rx, gave patient's wife information and #). - He was unable to tolerate even low dose losartan and creatinine has been elevated. Will not retry ARB or ARNI at this time.  - Unable to tolerate Bidil 1/2 tab tid due to lightheadedness.  - Based on most recent echo with EF<20%, likely needs  repeat R/LHC. Will diurese and complete DCCV, then consider scheduling R/LHC in 1-2 months. 4. CAD: Diffuse moderate to severe distal vessel disease on last cath with ectatic coronaries with slow flow. No interventional target.  No ischemic chest pain.  - Continue ASA 81 daily.  - Continue atorvastatin. Did not start Zetia as advised previously visit.  - Start Zetia 10 mg daily today. Recheck lipids in 6-8 weeks. 5. Smoking: I strongly encouraged him to quit smoking.   Discussed with Dr. Aundra Dubin, [place] w/ stat INR before procedure on Thurs, then follow  up 2 week post-procedure.  Maricela Bo MilfordFNP-BC 09/09/2020

## 2020-09-06 NOTE — H&P (View-Only) (Signed)
Patient ID: Anthony Rubio., male   DOB: [birth year], 71 y.o.   MRN: [id number] PCP: Dr. Jerline Pain Cardiology: Dr. Aundra Dubin  70 y.o. with history of CAD, mixed ischemic/nonischemic cardiomyopathy, and diabetes who presents for followup. He initially was hospitalized in 5/11 with acute decompensated CHF after several days of shortness of breath, orthopnea, and PND. He was diuresed and left/right heart catheterization was done. Left heart cath showed large, ectatic coronaries with slow flow and moderate to severe diffuse distal vessel disease (consistent with diabetes) without good interventional options. EF was 15-20% by echo. He was put on milrinone and further diuresed for several more days. He was titrated off milrinone and discharged on an oral medication regimen. He later developed atrial flutter requiring cardioversion. He remains in NSR.  Patient had Medtronic ICD placed in 11/11.  Repeat echo in 10/12 showed EF 25%.  Most recent echo in 1/19 showed EF 25% with severe LV dilation. He was noted to be in atrial fibrillation again in 2/19.  He had TEE-guided DCCV in 2/19 back to NSR. He has not been able tolerate even 1/2 tab tid of Bidil due to lightheadedness.   Today he returns for HF follow up. Last visit (12/21) he remained in NSR, still smoking a pack every 3-4 days, Wilder Glade was started. Now, overall feeling poor. Had a week of diarrhea in Feb and has since been quite fatigued with minimal activity. Feels his taste if off. Ankles are more swollen.  Feels he is not urinating as much. Sleeps on 3 pillows at night. Denies increasing SOB, CP, dizziness.  Appetite ok. No fever or chills. He does not weigh at home. Taking all medications.   Medtronic device reviewed: Optivol with fluid index > threshold, afib since Aug 05, 2020, mostly V-pacing since 1/22, no VT/VF (personally reviewed).   ECG (personally reviewed): atrial fibrillation (67 bpm), qrs 489 ms  Labs (9/11): LDL 61, HDL 26, K 4.8, creatinine 1.6   Labs (10/11): K 4.3, creatinine 1.3, BNP 165  Labs (11/11): K 4.9, creatinine 1.6  Labs (12/11): K 4.8, creatinine 1.7  Labs (2/12): LDL 110, HDL 28  Labs (3/12): K 4.1, creatinine 1.7 Labs (6/12): K 4.4, creatinine 1.4, BNP 125 Labs (10/12): K 4.8, creatinine 1.4 Labs (12/12): K 4.4, creatinine 1.5, BNP 20 Labs (3/13): K 5, creatinine 1.5, LDL 43, HDL 31 Labs (8/13): K 4.2, creatinine 1.3, BNP 158 Labs (05/03/14): K 4.6 Creatinine 1.4  Labs (9/16): K 4.4, creatinine 1.5 Labs (5/18): K 4.7, creatinine 1.63, pro-BNP 5249, hgb 14.4 Labs (12/18): K 4.5, creatinine 1.99, LDL 127, HDL 28, hgb 16.1 Labs (2/19): K 4.7, creatinine 2.46, LDL 78, HDL 26 Labs (11/20): K 4.9, creatinine 1.95 Labs (12/21): K 4.8, creatinine 1.89, hgb 16.4, LDL 88 HDL 27  Allergies:  1) Erythromycin   Past Medical History:  1. CAD: LHC (5/11) with 50-60% mid LAD, diffuse moderate distal LAD up to 60-70%, small OM2 subtotally occluded, moderate to severe diffuse distal PLOM disease, severe diffuse distal RCA, PDA, and PLV disease. Coronaries were ectatic with slow flow. Patient was started on Plavix given concern for thrombus formation in the ectatic coronaries. He stopped Plavix after starting coumadin with atrial flutter.  2. Chronic systolic CHF: Likely mixed ischemic/nonischemic. Echo (5/11) with EF 15-20%, severe global hypokinesis, mild MR, moderate diastolic dysfunction, mildly decreased RV systolic function. RHC (5/11) with mean RA 14 mmHg, PA 38/26, mean PCWP 25 mmHg, CI 1.8. Repeat echo (8/11) with EF 0000000, severe diastolic dysfunction,  mild MR, RV normal size and systolic function. Per official read, cannot rule out apical clot but it does appear to be only trabeculations. TEE (9/11): EF 15% with global hypokinesis, mild to moderate MR, no LV thrombus and no LAA thrombus.  Echo (10/12): EF 25%.  Echo (1/17) with EF 20-25%, severe LV dilation, mild aortic stenosis with AVA 1.5 cm^2, moderate MR, moderately  dilated RV with moderately decreased RV systolic function, PASP 50 mmHg. Medtronic ICD.  - Echo (1/19): EF 25%, severe LV dilation, mild RV dilation with severely decreased systolic function, mild-moderate MR.  - TEE (2/19): EF 25%, moderately dilated LV, mildly dilated RV with moderately decreased systolic function, moderate LAE/RAE, moderate TR, peak RV-RA gradient 56 mmHg, functionally bicuspid aortic valve without aortic stenosis.  - Echo (12/21): EF<20%, severe LV dysfunction, RV severely decreased, RVSP 43 mmHg, severe bi-atrial dilation, mild/mod MR/TR.   3. Type II diabetes 4. HTN  5. Smoker  6. Severe diabetic neuropathy with chronic gait dysfunction  7. History of left foot ulcer with poor healing  8. Atrial flutter s/p DCCV 9/11  9. Aortic stenosis: Mild by echo in 1/17. 10. Atrial fibrillation: Paroxysmal.  - DCCV to NSR in 2/19.  11. CKD: Stage 3.  Suspect diabetic nephropathy.  12. Functionally bicuspid aortic valve.   Family History:  Family History of Colon CA 1st degree relative <60 9other relative)  Heart disease (grandparent)  Stroke (other relative)  dm only in secondary relatives   Social History:  Pt lives in [place]. 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   Current Outpatient Medications  Medication Sig Dispense Refill  . atorvastatin (LIPITOR) 80 MG tablet Take 1 tablet (80 mg total) by mouth daily. 90 tablet 1  . carvedilol (COREG) 6.25 MG tablet Take 3 tablets (18.75 mg total) by mouth 2 (two) times daily with a meal. 540 tablet 2  . dapagliflozin propanediol (FARXIGA) 10 MG TABS tablet Take 1 tablet (10 mg total) by mouth daily before breakfast. 30 tablet 3  . ezetimibe (ZETIA) 10 MG tablet Take 1 tablet (10 mg total) by mouth daily. 30 tablet 11  . furosemide (LASIX) 40 MG tablet Take 1 tablet (40 mg total) by  mouth every morning AND 0.5 tablets (20 mg total) every evening. Needs appt for further refills. 135 tablet 3  . glipiZIDE (GLUCOTROL XL) 10 MG 24 hr tablet Take 1-2 tablets (10-20 mg total) by mouth daily with breakfast. Take 2 tablets if blood sugar > 200. 180 tablet 3  . isosorbide mononitrate (IMDUR) 30 MG 24 hr tablet Take 1/2 (one-half) tablet by mouth once daily 45 tablet 0  . Lancets (ACCU-CHEK SOFT TOUCH) lancets Use to test blood sugar once a day E11.9 100 each 3  . pantoprazole (PROTONIX) 40 MG tablet Take 20 mg by mouth daily as needed (heartburn).     Marland Kitchen spironolactone (ALDACTONE) 25 MG tablet Take 1 tablet (25 mg total) by mouth daily. 90 tablet 3  . warfarin (COUMADIN) 5 MG tablet TAKE 1 TO 1 & 1/2 (ONE & ONE-HALF) TABLETS BY MOUTH ONCE DAILY AS DIRECTED BY  COUMADIN  CLINIC 110 tablet 0   No current facility-administered medications for this encounter.   Wt Readings from Last 3 Encounters:  09/09/20 106.9 kg (235 lb 9.6 oz)  06/11/20 102.6 kg (226 lb 3.2 oz)  05/28/20 103.2 kg (227 lb 9.6 oz)   BP 100/80   Pulse 62   Wt 106.9 kg (235 lb 9.6 oz)   SpO2 93%   BMI 29.45 kg/m  General:  NAD. No resp difficulty. Arrived in wheelchair HEENT: Normal Neck: Supple. JVP 8-9. Carotids 2+ bilat; no bruits. No lymphadenopathy or thryomegaly appreciated. Cor: PMI nondisplaced. Irregular rate & rhythm. Distant heart tones. No rubs, gallops or murmurs. Lungs: Clear, diminished in bases. Abdomen: Nontender, +distended. No hepatosplenomegaly. No bruits or masses. Good bowel sounds. Extremities: No cyanosis, clubbing, rash, 1-2+ LE edema Neuro: Alert & oriented x 3, cranial nerves grossly intact. Moves all 4 extremities w/o difficulty. Affect pleasant.  Assessment/Plan:  1. Atrial fibrillation:  Prior atrial flutter in 2011.  He was noted to be in atrial fibrillation in 2/19 and underwent TEE-guided DCCV to NSR.   - I do not think that warfarin is a good anticoagulant for him (rarely  therapeutic). However, he is not going to be able to afford a DOAC as he does not have Medicare prescription coverage.  Will see if we can get him on a DOAC through an assistance program.  - He may not stay in NSR long-term after cardioversion given cardiomyopathy and likely would be best served by Tikosyn or ablation (based on CASTLE-HF), but I am not sure that he would agree to either. Consider referral to EP. - He is in atrial fibrillation today. Per device interrogation, appears this started at the end of January. Other than fatigue, patient has been asymptomatic.  - INR's have been supra therapeutic recently, but last INR 3.3 - Discussed with Dr. Aundra Dubin, schedule for DCCV this week. 2. CKD stage 3: Suspect diabetic nephropathy and neuropathy.  - BMET today.   3. Chronic systolic heart failure: Mixed ischemic/nonischemic cardiomyopathy. Has Medtronic ICD.  Echo in 1/19 with EF 25%, severely dilated LV, severely decreased RV systolic function. He has significant distal vessel CAD on prior cath as well as a history of poorly controlled diabetes. Echo (12/21) EF worse <20%. He has NYHA class III-IIIb symptoms, though not very active due to severe peripheral neuropathy. Today, he is volume overloaded on exam, weight up 10 lbs, and by OptiVol.  He has had a hard time tolerating medications due to orthostatic symptoms and also with baseline CKD stage 3.  - Stop lasix. Start torsemide 60 mg daily. (personally discussed with Dr. Aundra Dubin) - Continue Coreg 18.75 mg bid.  - Continue spironolactone 25 mg daily.  - Start Farxiga 10 mg daily, never started medication after last visit.  (approved for AZ&ME, just needs to call for Rx, gave patient's wife information and #). - He was unable to tolerate even low dose losartan and creatinine has been elevated. Will not retry ARB or ARNI at this time.  - Unable to tolerate Bidil 1/2 tab tid due to lightheadedness.  - Based on most recent echo with EF<20%, likely needs  repeat R/LHC. Will diurese and complete DCCV, then consider scheduling R/LHC in 1-2 months. 4. CAD: Diffuse moderate to severe distal vessel disease on last cath with ectatic coronaries with slow flow. No interventional target.  No ischemic chest pain.  - Continue ASA 81 daily.  - Continue atorvastatin. Did not start Zetia as advised previously visit.  - Start Zetia 10 mg daily today. Recheck lipids in 6-8 weeks. 5. Smoking: I strongly encouraged him to quit smoking.   Discussed with Dr. Aundra Dubin, [place] w/ stat INR before procedure on Thurs, then follow  up 2 week post-procedure.  Maricela Bo MilfordFNP-BC 09/09/2020

## 2020-09-06 NOTE — Patient Instructions (Addendum)
Description   Hold warfarin today, Then start taking 1 tablet daily excpet for 1/2 a tablet on Sundays and Thursdays. Recheck INR in 1 week. Call Coumadin Clinic for any changes in medications or upcoming procedures. [phone].

## 2020-09-09 ENCOUNTER — Other Ambulatory Visit: Payer: Self-pay

## 2020-09-09 ENCOUNTER — Ambulatory Visit (HOSPITAL_COMMUNITY)
Admission: RE | Admit: 2020-09-09 | Discharge: 2020-09-09 | Disposition: A | Discharge status: Home / Self Care | Payer: Medicare Other | Source: Ambulatory Visit | Attending: Family Medicine | Admitting: Family Medicine

## 2020-09-09 ENCOUNTER — Encounter (HOSPITAL_COMMUNITY): Payer: Self-pay

## 2020-09-09 ENCOUNTER — Telehealth (HOSPITAL_COMMUNITY): Payer: Self-pay

## 2020-09-09 VITALS — BP 100/80 | HR 62 | Wt 235.6 lb

## 2020-09-09 DIAGNOSIS — I5022 Chronic systolic (congestive) heart failure: Secondary | ICD-10-CM | POA: Diagnosis not present

## 2020-09-09 DIAGNOSIS — E1140 Type 2 diabetes mellitus with diabetic neuropathy, unspecified: Secondary | ICD-10-CM

## 2020-09-09 DIAGNOSIS — I428 Other cardiomyopathies: Secondary | ICD-10-CM | POA: Diagnosis not present

## 2020-09-09 DIAGNOSIS — Z7982 Long term (current) use of aspirin: Secondary | ICD-10-CM | POA: Diagnosis not present

## 2020-09-09 DIAGNOSIS — F17200 Nicotine dependence, unspecified, uncomplicated: Secondary | ICD-10-CM

## 2020-09-09 DIAGNOSIS — Z833 Family history of diabetes mellitus: Secondary | ICD-10-CM | POA: Insufficient documentation

## 2020-09-09 DIAGNOSIS — Z881 Allergy status to other antibiotic agents status: Secondary | ICD-10-CM | POA: Insufficient documentation

## 2020-09-09 DIAGNOSIS — IMO0002 Reserved for concepts with insufficient information to code with codable children: Secondary | ICD-10-CM

## 2020-09-09 DIAGNOSIS — N183 Chronic kidney disease, stage 3 unspecified: Secondary | ICD-10-CM | POA: Insufficient documentation

## 2020-09-09 DIAGNOSIS — I130 Hypertensive heart and chronic kidney disease with heart failure and stage 1 through stage 4 chronic kidney disease, or unspecified chronic kidney disease: Secondary | ICD-10-CM | POA: Diagnosis not present

## 2020-09-09 DIAGNOSIS — F17210 Nicotine dependence, cigarettes, uncomplicated: Secondary | ICD-10-CM | POA: Insufficient documentation

## 2020-09-09 DIAGNOSIS — Z8249 Family history of ischemic heart disease and other diseases of the circulatory system: Secondary | ICD-10-CM | POA: Insufficient documentation

## 2020-09-09 DIAGNOSIS — E1165 Type 2 diabetes mellitus with hyperglycemia: Secondary | ICD-10-CM

## 2020-09-09 DIAGNOSIS — Z7901 Long term (current) use of anticoagulants: Secondary | ICD-10-CM | POA: Insufficient documentation

## 2020-09-09 DIAGNOSIS — Z7984 Long term (current) use of oral hypoglycemic drugs: Secondary | ICD-10-CM | POA: Insufficient documentation

## 2020-09-09 DIAGNOSIS — I2510 Atherosclerotic heart disease of native coronary artery without angina pectoris: Secondary | ICD-10-CM | POA: Diagnosis not present

## 2020-09-09 DIAGNOSIS — E1122 Type 2 diabetes mellitus with diabetic chronic kidney disease: Secondary | ICD-10-CM | POA: Diagnosis not present

## 2020-09-09 DIAGNOSIS — Z79899 Other long term (current) drug therapy: Secondary | ICD-10-CM | POA: Diagnosis not present

## 2020-09-09 DIAGNOSIS — N1832 Chronic kidney disease, stage 3b: Secondary | ICD-10-CM | POA: Diagnosis not present

## 2020-09-09 DIAGNOSIS — I480 Paroxysmal atrial fibrillation: Secondary | ICD-10-CM | POA: Insufficient documentation

## 2020-09-09 DIAGNOSIS — R42 Dizziness and giddiness: Secondary | ICD-10-CM | POA: Insufficient documentation

## 2020-09-09 LAB — BASIC METABOLIC PANEL
Anion gap: 12 (ref 5–15)
BUN: 61 mg/dL — ABNORMAL HIGH (ref 8–23)
CO2: 19 mmol/L — ABNORMAL LOW (ref 22–32)
Calcium: 9 mg/dL (ref 8.9–10.3)
Chloride: 109 mmol/L (ref 98–111)
Creatinine, Ser: 2.9 mg/dL — ABNORMAL HIGH (ref 0.61–1.24)
GFR, Estimated: 22 mL/min — ABNORMAL LOW (ref >60)
Glucose, Bld: 95 mg/dL (ref 70–99)
Potassium: 4.7 mmol/L (ref 3.5–5.1)
Sodium: 140 mmol/L (ref 135–145)

## 2020-09-09 LAB — BRAIN NATRIURETIC PEPTIDE: B Natriuretic Peptide: 2121.4 pg/mL — ABNORMAL HIGH (ref 0.0–100.0)

## 2020-09-09 LAB — PROTIME-INR
INR: 2.7 — ABNORMAL HIGH (ref 0.8–1.2)
Prothrombin Time: 27.5 seconds — ABNORMAL HIGH (ref 11.4–15.2)

## 2020-09-09 MED ORDER — TORSEMIDE 20 MG PO TABS: 60.0000 mg | ORAL_TABLET | Freq: Every day | ORAL | 3 refills | Status: DC

## 2020-09-09 MED ORDER — EZETIMIBE 10 MG PO TABS: 10.0000 mg | ORAL_TABLET | Freq: Every day | ORAL | 3 refills | Status: AC

## 2020-09-09 NOTE — Patient Instructions (Addendum)
Labs done today. We will contact you only if your labs are abnormal.  STOP taking Lasix  START Torsemide '60mg'$  (3 tablets) by mouth daily.  RESTART Zetia '10mg'$  (1 tablet) by mouth daily.  No other medication changes were made. Please continue all current medications as prescribed.  Your physician recommends that you schedule a follow-up appointment in: 2 weeks with Dr. Aundra Dubin   YOU MUST HAVE PRE PROCEDURE COVID TESTING DONE TOMORROW MARCH 8TH BETWEEN 8AM-2PM. YOU MUST SELF QUARANTINE UNTIL THE DAY OF YOUR PROCEDURE. OUR DRIVE THRU TESTING LOCATION IS [address] [place], [place] [address]  If you have any questions or concerns before your next appointment please send [place] a message through [place] or call our office at [phone].    TO LEAVE A MESSAGE FOR THE NURSE SELECT OPTION 2, PLEASE LEAVE A MESSAGE INCLUDING: . YOUR NAME . DATE OF BIRTH . CALL BACK NUMBER . REASON FOR CALL**this is important as we prioritize the call backs  YOU WILL RECEIVE A CALL BACK THE SAME DAY AS LONG AS YOU CALL BEFORE 4:00 PM   Do the following things EVERYDAY: 1) Weigh yourself in the morning before breakfast. Write it down and keep it in a log. 2) Take your medicines as prescribed 3) Eat low salt foods--Limit salt (sodium) to 2000 mg per day.  4) Stay as active as you can everyday 5) Limit all fluids for the day to less than 2 liters   At the [facility], you and your health needs are our priority. As part of our continuing mission to provide you with exceptional heart care, we have created designated Provider Care Teams. These Care Teams include your primary Cardiologist (physician) and Advanced Practice Providers (APPs- Physician Assistants and Nurse Practitioners) who all work together to provide you with the care you need, when you need it.   You may see any of the following providers on your designated Care Team at your next follow up: Marland Kitchen Dr Glori Bickers . Dr [name] . Darrick Grinder, NP . Lyda Jester, PA . Audry Riles, PharmD   Please be sure to bring in all your medications bottles to every appointment.

## 2020-09-09 NOTE — Telephone Encounter (Signed)
Patient advised and verbalized understanding.med list updated to reflect changes

## 2020-09-09 NOTE — Telephone Encounter (Signed)
-----   Message from Anthony Rubio, [place] sent at 09/09/2020 12:09 PM EST ----- Kidney function significantly elevated, do NOT start Farxiga.

## 2020-09-10 ENCOUNTER — Other Ambulatory Visit (HOSPITAL_COMMUNITY)
Admission: RE | Admit: 2020-09-10 | Discharge: 2020-09-10 | Disposition: A | Discharge status: Home / Self Care | Payer: Medicare Other | Source: Ambulatory Visit | Attending: Cardiology | Admitting: Cardiology

## 2020-09-10 DIAGNOSIS — Z01812 Encounter for preprocedural laboratory examination: Secondary | ICD-10-CM | POA: Insufficient documentation

## 2020-09-10 DIAGNOSIS — Z20822 Contact with and (suspected) exposure to covid-19: Secondary | ICD-10-CM | POA: Diagnosis not present

## 2020-09-11 ENCOUNTER — Other Ambulatory Visit (HOSPITAL_COMMUNITY): Payer: Self-pay | Admitting: *Deleted

## 2020-09-11 LAB — SARS CORONAVIRUS 2 (TAT 6-24 HRS): SARS Coronavirus 2: NEGATIVE

## 2020-09-12 ENCOUNTER — Ambulatory Visit (HOSPITAL_COMMUNITY): Payer: Medicare Other | Admitting: Certified Registered"

## 2020-09-12 ENCOUNTER — Ambulatory Visit (HOSPITAL_COMMUNITY)
Admission: RE | Admit: 2020-09-12 | Discharge: 2020-09-12 | Disposition: A | Discharge status: Home / Self Care | Payer: Medicare Other | Attending: Cardiology | Admitting: Cardiology

## 2020-09-12 ENCOUNTER — Other Ambulatory Visit: Payer: Self-pay

## 2020-09-12 ENCOUNTER — Encounter (HOSPITAL_COMMUNITY)
Admission: RE | Disposition: A | Discharge status: Home / Self Care | Payer: Self-pay | Source: Home / Self Care | Attending: Cardiology

## 2020-09-12 ENCOUNTER — Encounter (HOSPITAL_COMMUNITY): Payer: Self-pay | Admitting: Cardiology

## 2020-09-12 DIAGNOSIS — I130 Hypertensive heart and chronic kidney disease with heart failure and stage 1 through stage 4 chronic kidney disease, or unspecified chronic kidney disease: Secondary | ICD-10-CM | POA: Diagnosis not present

## 2020-09-12 DIAGNOSIS — E1122 Type 2 diabetes mellitus with diabetic chronic kidney disease: Secondary | ICD-10-CM | POA: Diagnosis not present

## 2020-09-12 DIAGNOSIS — F17210 Nicotine dependence, cigarettes, uncomplicated: Secondary | ICD-10-CM | POA: Diagnosis not present

## 2020-09-12 DIAGNOSIS — I5022 Chronic systolic (congestive) heart failure: Secondary | ICD-10-CM | POA: Diagnosis not present

## 2020-09-12 DIAGNOSIS — N183 Chronic kidney disease, stage 3 unspecified: Secondary | ICD-10-CM | POA: Insufficient documentation

## 2020-09-12 DIAGNOSIS — I480 Paroxysmal atrial fibrillation: Secondary | ICD-10-CM | POA: Insufficient documentation

## 2020-09-12 DIAGNOSIS — Z7982 Long term (current) use of aspirin: Secondary | ICD-10-CM | POA: Insufficient documentation

## 2020-09-12 DIAGNOSIS — Z95810 Presence of automatic (implantable) cardiac defibrillator: Secondary | ICD-10-CM | POA: Diagnosis not present

## 2020-09-12 DIAGNOSIS — I428 Other cardiomyopathies: Secondary | ICD-10-CM | POA: Diagnosis not present

## 2020-09-12 DIAGNOSIS — Z7901 Long term (current) use of anticoagulants: Secondary | ICD-10-CM | POA: Diagnosis not present

## 2020-09-12 DIAGNOSIS — I2510 Atherosclerotic heart disease of native coronary artery without angina pectoris: Secondary | ICD-10-CM | POA: Insufficient documentation

## 2020-09-12 DIAGNOSIS — Z79899 Other long term (current) drug therapy: Secondary | ICD-10-CM | POA: Diagnosis not present

## 2020-09-12 DIAGNOSIS — Z833 Family history of diabetes mellitus: Secondary | ICD-10-CM | POA: Diagnosis not present

## 2020-09-12 DIAGNOSIS — Z7984 Long term (current) use of oral hypoglycemic drugs: Secondary | ICD-10-CM | POA: Insufficient documentation

## 2020-09-12 DIAGNOSIS — I4891 Unspecified atrial fibrillation: Secondary | ICD-10-CM | POA: Diagnosis not present

## 2020-09-12 HISTORY — PX: CARDIOVERSION: SHX1299

## 2020-09-12 LAB — GLUCOSE, CAPILLARY: Glucose-Capillary: 97 mg/dL (ref 70–99)

## 2020-09-12 LAB — PROTIME-INR
INR: 3.3 — ABNORMAL HIGH (ref 0.8–1.2)
Prothrombin Time: 32.6 seconds — ABNORMAL HIGH (ref 11.4–15.2)

## 2020-09-12 SURGERY — CARDIOVERSION
Anesthesia: General

## 2020-09-12 MED ORDER — LIDOCAINE 2% (20 MG/ML) 5 ML SYRINGE
INTRAMUSCULAR | Status: DC | PRN
  Administered 2020-09-12: 80 mg via INTRAVENOUS

## 2020-09-12 MED ORDER — SODIUM CHLORIDE 0.9 % IV SOLN: INTRAVENOUS | Status: DC | PRN

## 2020-09-12 MED ORDER — PROPOFOL 10 MG/ML IV BOLUS
INTRAVENOUS | Status: DC | PRN
  Administered 2020-09-12: 40 mg via INTRAVENOUS

## 2020-09-12 NOTE — Interval H&P Note (Signed)
History and Physical Interval Note:  09/12/2020 9:22 AM  Anthony Rubio.  has presented today for surgery, with the diagnosis of afib.  The various methods of treatment have been discussed with the patient and family. After consideration of risks, benefits and other options for treatment, the patient has consented to  Procedure(s) with comments: CARDIOVERSION (N/A) - STAT INR!!! as a surgical intervention.  The patient's history has been reviewed, patient examined, no change in status, stable for surgery.  I have reviewed the patient's chart and labs.  Questions were answered to the patient's satisfaction.     [name]

## 2020-09-12 NOTE — Discharge Instructions (Signed)
Electrical Cardioversion Electrical cardioversion is the delivery of a jolt of electricity to restore a normal rhythm to the heart. A rhythm that is too fast or is not regular keeps the heart from pumping well. In this procedure, sticky patches or metal paddles are placed on the chest to deliver electricity to the heart from a device. This procedure may be done in an emergency if:  There is low or no blood pressure as a result of the heart rhythm.  Normal rhythm must be restored as fast as possible to protect the brain and heart from further damage.  It may save a life. This may also be a scheduled procedure for irregular or fast heart rhythms that are not immediately life-threatening. Tell a health care provider about:  Any allergies you have.  All medicines you are taking, including vitamins, herbs, eye drops, creams, and over-the-counter medicines.  Any problems you or family members have had with anesthetic medicines.  Any blood disorders you have.  Any surgeries you have had.  Any medical conditions you have.  Whether you are pregnant or may be pregnant. What are the risks? Generally, this is a safe procedure. However, problems may occur, including:  Allergic reactions to medicines.  A blood clot that breaks free and travels to other parts of your body.  The possible return of an abnormal heart rhythm within hours or days after the procedure.  Your heart stopping (cardiac arrest). This is rare. What happens before the procedure? Medicines  Your health care provider may have you start taking: ? Blood-thinning medicines (anticoagulants) so your blood does not clot as easily. ? Medicines to help stabilize your heart rate and rhythm.  Ask your health care provider about: ? Changing or stopping your regular medicines. This is especially important if you are taking diabetes medicines or blood thinners. ? Taking medicines such as aspirin and ibuprofen. These medicines can  thin your blood. Do not take these medicines unless your health care provider tells you to take them. ? Taking over-the-counter medicines, vitamins, herbs, and supplements. General instructions  Follow instructions from your health care provider about eating or drinking restrictions.  Plan to have someone take you home from the hospital or clinic.  If you will be going home right after the procedure, plan to have someone with you for 24 hours.  Ask your health care provider what steps will be taken to help prevent infection. These may include washing your skin with a germ-killing soap. What happens during the procedure?  An IV will be inserted into one of your veins.  Sticky patches (electrodes) or metal paddles may be placed on your chest.  You will be given a medicine to help you relax (sedative).  An electrical shock will be delivered. The procedure may vary among health care providers and hospitals.   What can I expect after the procedure?  Your blood pressure, heart rate, breathing rate, and blood oxygen level will be monitored until you leave the hospital or clinic.  Your heart rhythm will be watched to make sure it does not change.  You may have some redness on the skin where the shocks were given. Follow these instructions at home:  Do not drive for 24 hours if you were given a sedative during your procedure.  Take over-the-counter and prescription medicines only as told by your health care provider.  Ask your health care provider how to check your pulse. Check it often.  Rest for 48 hours after the procedure   or as told by your health care provider.  Avoid or limit your caffeine use as told by your health care provider.  Keep all follow-up visits as told by your health care provider. This is important. Contact a health care provider if:  You feel like your heart is beating too quickly or your pulse is not regular.  You have a serious muscle cramp that does not go  away. Get help right away if:  You have discomfort in your chest.  You are dizzy or you feel faint.  You have trouble breathing or you are short of breath.  Your speech is slurred.  You have trouble moving an arm or leg on one side of your body.  Your fingers or toes turn cold or blue. Summary  Electrical cardioversion is the delivery of a jolt of electricity to restore a normal rhythm to the heart.  This procedure may be done right away in an emergency or may be a scheduled procedure if the condition is not an emergency.  Generally, this is a safe procedure.  After the procedure, check your pulse often as told by your health care provider. This information is not intended to replace advice given to you by your health care provider. Make sure you discuss any questions you have with your health care provider. Document Revised: 01/23/2019 Document Reviewed: 01/23/2019 Elsevier Patient Education  2021 [employer] Inc.  

## 2020-09-12 NOTE — Anesthesia Procedure Notes (Signed)
Procedure Name: General with mask airway Date/Time: 09/12/2020 9:29 AM Performed by: Imagene Riches, CRNA Pre-anesthesia Checklist: Patient identified, Emergency Drugs available, Suction available, Patient being monitored and Timeout performed Patient Re-evaluated:Patient Re-evaluated prior to induction Oxygen Delivery Method: Ambu bag Preoxygenation: Pre-oxygenation with 100% oxygen

## 2020-09-12 NOTE — Procedures (Signed)
Electrical Cardioversion Procedure Note Anthony Rubio [id number] 18-Jul-1950  Procedure: Electrical Cardioversion Indications:  Atrial Fibrillation  Procedure Details Consent: Risks of procedure as well as the alternatives and risks of each were explained to the (patient/caregiver).  Consent for procedure obtained. Time Out: Verified patient identification, verified procedure, site/side was marked, verified correct patient position, special equipment/implants available, medications/allergies/relevent history reviewed, required imaging and test results available.  Performed  Patient placed on cardiac monitor, pulse oximetry, supplemental oxygen as necessary.  Sedation given: Per anesthesiology Pacer pads placed anterior and posterior chest.  Cardioverted 1 time(s).  Cardioverted at [place].  Evaluation Findings: Post procedure EKG shows: NSR Complications: None Patient did tolerate procedure well.  Patient was V-pacing prior to DCCV, after DCCV minimal V-pacing noted.    Anthony Rubio 09/12/2020, 9:31 AM

## 2020-09-12 NOTE — Transfer of Care (Signed)
Immediate Anesthesia Transfer of Care Note  Patient: Anthony Rubio.  Procedure(s) Performed: CARDIOVERSION (N/A )  Patient Location: Endoscopy Unit  Anesthesia Type:General  Level of Consciousness: drowsy  Airway & Oxygen Therapy: Patient Spontanous Breathing  Post-op Assessment: Report given to RN and Post -op Vital signs reviewed and stable  Post vital signs: Reviewed and stable  Last Vitals:  Vitals Value Taken Time  BP    Temp    Pulse    Resp    SpO2      Last Pain:  Vitals:   09/12/20 0823  TempSrc: Oral  PainSc: 0-No pain         Complications: No complications documented.

## 2020-09-12 NOTE — Anesthesia Preprocedure Evaluation (Addendum)
Anesthesia Evaluation  Patient identified by MRN, date of birth, ID band Patient awake    Reviewed: Allergy & Precautions, NPO status , Patient's Chart, lab work & pertinent test results  Airway Mallampati: II  TM Distance: >3 FB Neck ROM: Full    Dental  (+) Poor Dentition, Missing   Pulmonary neg pulmonary ROS, Current Smoker,    Pulmonary exam normal breath sounds clear to auscultation       Cardiovascular hypertension, +CHF  Normal cardiovascular exam+ dysrhythmias Atrial Fibrillation + Cardiac Defibrillator  Rhythm:Irregular Rate:Abnormal  EKG 09/09/20: Atrial fibrillation with occasional ventricular-paced complexes Non-specific intra-ventricular conduction block Possible Anterolateral infarct , age undetermined Inferior infarct , age undetermined Abnormal ECG No significant change since last tracing Confirmed by Glori Bickers [phone]) on 09/10/2020 12:49:02 AM  ECHO 06/2020:  1. Left ventricular ejection fraction, by estimation, is <20%. The left  ventricle has severely decreased function. The left ventricle demonstrates  global hypokinesis. The left ventricular internal cavity size was severely  dilated. There is mild left  ventricular hypertrophy. Left ventricular diastolic parameters are  indeterminate.  2. Right ventricular systolic function is severely reduced. The right  ventricular size is moderately enlarged. There is moderately elevated  pulmonary artery systolic pressure. The estimated right ventricular  systolic pressure is 0000000 mmHg.  3. Left atrial size was severely dilated.  4. Right atrial size was severely dilated.  5. The mitral valve is grossly normal. Mild to moderate mitral valve  regurgitation.  6. Tricuspid valve regurgitation is mild to moderate.  7. The aortic valve is tricuspid. There is moderate calcification of the  aortic valve. There is moderate thickening of the aortic valve.  Aortic  valve regurgitation is mild.  8. Aortic dilatation noted. There is mild dilatation of the ascending  aorta, measuring 40 mm.    Neuro/Psych negative neurological ROS  negative psych ROS   GI/Hepatic Neg liver ROS, GERD  ,  Endo/Other  negative endocrine ROSdiabetes, Poorly Controlled, Type 2  Renal/GU Renal InsufficiencyRenal disease  negative genitourinary   Musculoskeletal  (+) Arthritis ,   Abdominal   Peds negative pediatric ROS (+)  Hematology negative hematology ROS (+)   Anesthesia Other Findings   Reproductive/Obstetrics negative OB ROS                            Anesthesia Physical Anesthesia Plan  ASA: IV  Anesthesia Plan: General   Post-op Pain Management:    Induction:   PONV Risk Score and Plan: Propofol infusion, TIVA and Treatment may vary due to age or medical condition  Airway Management Planned: Mask  Additional Equipment:   Intra-op Plan:   Post-operative Plan:   Informed Consent: I have reviewed the patients History and Physical, chart, labs and discussed the procedure including the risks, benefits and alternatives for the proposed anesthesia with the patient or authorized representative who has indicated his/her understanding and acceptance.       Plan Discussed with: Anesthesiologist and CRNA  Anesthesia Plan Comments:         Anesthesia Quick Evaluation

## 2020-09-12 NOTE — Anesthesia Postprocedure Evaluation (Signed)
Anesthesia Post Note  Patient: Anthony Rubio.  Procedure(s) Performed: CARDIOVERSION (N/A )     Patient location during evaluation: PACU Anesthesia Type: General Level of consciousness: awake and alert Pain management: pain level controlled Vital Signs Assessment: post-procedure vital signs reviewed and stable Respiratory status: spontaneous breathing, nonlabored ventilation and respiratory function stable Cardiovascular status: blood pressure returned to baseline and stable Postop Assessment: no apparent nausea or vomiting Anesthetic complications: no   No complications documented.  Last Vitals:  Vitals:   09/12/20 0823 09/12/20 0930  BP: 96/79 98/76  Pulse: 66 60  Resp: 16 20  Temp: 36.6 C (!) 36.3 C  SpO2: 96%     Last Pain:  Vitals:   09/12/20 0940  TempSrc:   PainSc: 0-No pain                 Merlinda Frederick

## 2020-09-15 ENCOUNTER — Encounter (HOSPITAL_COMMUNITY): Payer: Self-pay | Admitting: Cardiology

## 2020-09-16 ENCOUNTER — Other Ambulatory Visit (HOSPITAL_COMMUNITY): Payer: Self-pay | Admitting: Student

## 2020-09-16 ENCOUNTER — Other Ambulatory Visit: Payer: Self-pay | Admitting: Internal Medicine

## 2020-09-17 ENCOUNTER — Telehealth (HOSPITAL_COMMUNITY): Payer: Self-pay | Admitting: *Deleted

## 2020-09-17 ENCOUNTER — Ambulatory Visit (INDEPENDENT_AMBULATORY_CARE_PROVIDER_SITE_OTHER): Payer: Medicare Other

## 2020-09-17 ENCOUNTER — Encounter: Payer: Medicare Other | Admitting: Student

## 2020-09-17 ENCOUNTER — Other Ambulatory Visit: Payer: Self-pay

## 2020-09-17 DIAGNOSIS — Z5181 Encounter for therapeutic drug level monitoring: Secondary | ICD-10-CM

## 2020-09-17 DIAGNOSIS — I483 Typical atrial flutter: Secondary | ICD-10-CM | POA: Diagnosis not present

## 2020-09-17 LAB — POCT INR: INR: 5.6 — AB (ref 2.0–3.0)

## 2020-09-17 NOTE — Telephone Encounter (Signed)
-----   Message from Brynda Peon, RN sent at 09/17/2020 10:16 AM EDT ----- Anthony Rubio, This pt was seen in the [facility] today. He has an appt to see Dr Aundra Dubin on 09/23/20 in the CHF clinic.  He was recently cardioverted on 09/12/20. Pt wishes to discuss switching to Xarelto from Warfarin.  Will need INR drawn at lab prior to switching.  Pt was confused at last appt on 09/09/20 and thought the [place] he was rx and states he is awaiting rx from mail order (which I don't see on his med list) was a blood thinner that was going to replace the Warfarin(per pt and pt's wife's report).  Pt also states he is awaiting Torsemide rx as well.  Pt is also NOT taking the Zetia as he has been rx.  He worried about taking Zetia in combination with Statin drug and muscle weakness (or muscle destruction-as he likes to refer to it as) as a side effect of taking these drugs in combination, (which he read about), wishes to discuss further with Dr Aundra Dubin. Pt gets easily confused when discussing medications, probably taking them as well!  His INR has been elevated numerous times lately, because we change his dosage at [facility] appt, but pt does not change home dosage, resumes 1 tablet daily.

## 2020-09-17 NOTE — Telephone Encounter (Signed)
Spoke w/pt and wife. They state they were approved for Farixga assit but was then told not to take medication due to some blood work. Advised that was correct, elevated kidney fx so Dr Aundra Dubin did not want him to start that medication. Advised at appt on Monday 3/21 Dr Aundra Dubin can discuss switching pt to Xarelto, with no rx coverage should qualify for pt assist. Explained reason and importance of Zetia, pt's wife verbalized understanding and agreement but states pt is focused solely on it causing muscle damage and will not take medication until he discusses with Dr Aundra Dubin. Advised this can also be discussed at [facility] 3/21

## 2020-09-17 NOTE — Patient Instructions (Addendum)
Description   Hold warfarin x 3 dosages, then start taking 1 tablet daily excpet for 1/2 a tablet on Sundays, Tuesdays and Thursdays. Recheck INR in 1 week. Call Coumadin Clinic for any changes in medications or upcoming procedures. [phone].    Discuss switching to Xarelto (blood thinner) that would replace Warfarin.  Will need INR drawn at lab prior to switching.  Pt is worried about taking Zetia in combination with Statin drug and muscle weakness as a side effect of taking these drugs in combination, wishes to discuss further.

## 2020-09-23 ENCOUNTER — Other Ambulatory Visit: Payer: Self-pay

## 2020-09-23 ENCOUNTER — Encounter (HOSPITAL_COMMUNITY): Payer: Self-pay | Admitting: Cardiology

## 2020-09-23 ENCOUNTER — Ambulatory Visit (HOSPITAL_COMMUNITY)
Admission: RE | Admit: 2020-09-23 | Discharge: 2020-09-23 | Disposition: A | Discharge status: Home / Self Care | Payer: Medicare Other | Source: Ambulatory Visit | Attending: Cardiology | Admitting: Cardiology

## 2020-09-23 VITALS — BP 108/68 | HR 66 | Wt 222.0 lb

## 2020-09-23 DIAGNOSIS — I2510 Atherosclerotic heart disease of native coronary artery without angina pectoris: Secondary | ICD-10-CM | POA: Insufficient documentation

## 2020-09-23 DIAGNOSIS — E1122 Type 2 diabetes mellitus with diabetic chronic kidney disease: Secondary | ICD-10-CM | POA: Diagnosis not present

## 2020-09-23 DIAGNOSIS — I454 Nonspecific intraventricular block: Secondary | ICD-10-CM | POA: Insufficient documentation

## 2020-09-23 DIAGNOSIS — I130 Hypertensive heart and chronic kidney disease with heart failure and stage 1 through stage 4 chronic kidney disease, or unspecified chronic kidney disease: Secondary | ICD-10-CM | POA: Diagnosis not present

## 2020-09-23 DIAGNOSIS — F17210 Nicotine dependence, cigarettes, uncomplicated: Secondary | ICD-10-CM | POA: Diagnosis not present

## 2020-09-23 DIAGNOSIS — Z7984 Long term (current) use of oral hypoglycemic drugs: Secondary | ICD-10-CM | POA: Insufficient documentation

## 2020-09-23 DIAGNOSIS — I480 Paroxysmal atrial fibrillation: Secondary | ICD-10-CM | POA: Diagnosis not present

## 2020-09-23 DIAGNOSIS — E1140 Type 2 diabetes mellitus with diabetic neuropathy, unspecified: Secondary | ICD-10-CM | POA: Insufficient documentation

## 2020-09-23 DIAGNOSIS — Z7982 Long term (current) use of aspirin: Secondary | ICD-10-CM | POA: Insufficient documentation

## 2020-09-23 DIAGNOSIS — Z79899 Other long term (current) drug therapy: Secondary | ICD-10-CM | POA: Insufficient documentation

## 2020-09-23 DIAGNOSIS — I4439 Other atrioventricular block: Secondary | ICD-10-CM | POA: Insufficient documentation

## 2020-09-23 DIAGNOSIS — I428 Other cardiomyopathies: Secondary | ICD-10-CM | POA: Diagnosis not present

## 2020-09-23 DIAGNOSIS — I4892 Unspecified atrial flutter: Secondary | ICD-10-CM | POA: Insufficient documentation

## 2020-09-23 DIAGNOSIS — I5022 Chronic systolic (congestive) heart failure: Secondary | ICD-10-CM | POA: Diagnosis not present

## 2020-09-23 DIAGNOSIS — N183 Chronic kidney disease, stage 3 unspecified: Secondary | ICD-10-CM | POA: Insufficient documentation

## 2020-09-23 DIAGNOSIS — Z7901 Long term (current) use of anticoagulants: Secondary | ICD-10-CM | POA: Insufficient documentation

## 2020-09-23 DIAGNOSIS — Z8249 Family history of ischemic heart disease and other diseases of the circulatory system: Secondary | ICD-10-CM | POA: Diagnosis not present

## 2020-09-23 DIAGNOSIS — Z95810 Presence of automatic (implantable) cardiac defibrillator: Secondary | ICD-10-CM | POA: Diagnosis not present

## 2020-09-23 LAB — CBC
HCT: 50.3 % (ref 39.0–52.0)
Hemoglobin: 15.8 g/dL (ref 13.0–17.0)
MCH: 29.1 pg (ref 26.0–34.0)
MCHC: 31.4 g/dL (ref 30.0–36.0)
MCV: 92.6 fL (ref 80.0–100.0)
Platelets: 119 10*3/uL — ABNORMAL LOW (ref 150–400)
RBC: 5.43 MIL/uL (ref 4.22–5.81)
RDW: 18.9 % — ABNORMAL HIGH (ref 11.5–15.5)
WBC: 5.3 10*3/uL (ref 4.0–10.5)
nRBC: 0 % (ref 0.0–0.2)

## 2020-09-23 LAB — BASIC METABOLIC PANEL
Anion gap: 10 (ref 5–15)
BUN: 60 mg/dL — ABNORMAL HIGH (ref 8–23)
CO2: 25 mmol/L (ref 22–32)
Calcium: 9.3 mg/dL (ref 8.9–10.3)
Chloride: 105 mmol/L (ref 98–111)
Creatinine, Ser: 2.59 mg/dL — ABNORMAL HIGH (ref 0.61–1.24)
GFR, Estimated: 26 mL/min — ABNORMAL LOW (ref >60)
Glucose, Bld: 108 mg/dL — ABNORMAL HIGH (ref 70–99)
Potassium: 4.3 mmol/L (ref 3.5–5.1)
Sodium: 140 mmol/L (ref 135–145)

## 2020-09-23 LAB — PROTIME-INR
INR: 1.7 — ABNORMAL HIGH (ref 0.8–1.2)
Prothrombin Time: 19.3 seconds — ABNORMAL HIGH (ref 11.4–15.2)

## 2020-09-23 MED ORDER — AMIODARONE HCL 200 MG PO TABS: ORAL_TABLET | ORAL | 3 refills | Status: DC

## 2020-09-23 MED ORDER — APIXABAN 5 MG PO TABS: 5.0000 mg | ORAL_TABLET | Freq: Two times a day (BID) | ORAL | 6 refills | Status: DC

## 2020-09-23 MED ORDER — TORSEMIDE 20 MG PO TABS: 80.0000 mg | ORAL_TABLET | Freq: Every day | ORAL | 3 refills | Status: DC

## 2020-09-23 NOTE — Progress Notes (Signed)
Patient ID: Anthony Rubio., male   DOB: February 01, 1951, 70 y.o.   MRN: [id number] PCP: Dr. Jerline Pain Cardiology: Dr. Aundra Dubin  70 y.o. with history of CAD, mixed ischemic/nonischemic cardiomyopathy, and diabetes who presents for followup. He initially was hospitalized in 5/11 with acute decompensated CHF after several days of shortness of breath, orthopnea, and PND. He was diuresed and left/right heart catheterization was done. Left heart cath showed large, ectatic coronaries with slow flow and moderate to severe diffuse distal vessel disease (consistent with diabetes) without good interventional options. EF was 15-20% by echo. He was put on milrinone and further diuresed for several more days. He was titrated off milrinone and discharged on an oral medication regimen. He later developed atrial flutter requiring cardioversion. He remains in NSR.  Patient had Medtronic ICD placed in 11/11.  Repeat echo in 10/12 showed EF 25%.  Most recent echo in 1/19 showed EF 25% with severe LV dilation. He was noted to be in atrial fibrillation again in 2/19.  He had TEE-guided DCCV in 2/19 back to NSR. He has not been able tolerate even 1/2 tab tid of Bidil due to lightheadedness.   He went into atrial fibrillation in 2/22 with increased RV pacing and worsening CHF.  He had DCCV back to NSR in 3/22. However, it appears that a few days after DCCV, he went back into atrial fibrillation.   He returns for followup of CHF.  He remains in atrial fibrillation today.  He is not very active due to severe diabetic neuropathy, uses a wheelchair when he leaves the house, he uses a walker when walking around the house.  He feels worse when in atrial fibrillation, more fatigued and dyspneic.  He is short of breath walking around the house.  However, on torsemide, he has lost significant weight (13 lbs).  His appetite is poor.  No chest pain.  No orthopnea/PND.  Still smokes a pack every 3-4 days.    Medtronic device reviewed: Optivol with  fluid index > threshold, he is in atrial fibrillation with frequent V-pacing.  ECG (personally reviewed): atrial fibrillation at rate 62, IVCD with intermittent RV pacing.   Labs (9/11): LDL 61, HDL 26, K 4.8, creatinine 1.6  Labs (10/11): K 4.3, creatinine 1.3, BNP 165  Labs (11/11): K 4.9, creatinine 1.6  Labs (12/11): K 4.8, creatinine 1.7  Labs (2/12): LDL 110, HDL 28  Labs (3/12): K 4.1, creatinine 1.7 Labs (6/12): K 4.4, creatinine 1.4, BNP 125 Labs (10/12): K 4.8, creatinine 1.4 Labs (12/12): K 4.4, creatinine 1.5, BNP 20 Labs (3/13): K 5, creatinine 1.5, LDL 43, HDL 31 Labs (8/13): K 4.2, creatinine 1.3, BNP 158 Labs (05/03/14): K 4.6 Creatinine 1.4  Labs (9/16): K 4.4, creatinine 1.5 Labs (5/18): K 4.7, creatinine 1.63, pro-BNP 5249, hgb 14.4 Labs (12/18): K 4.5, creatinine 1.99, LDL 127, HDL 28, hgb 16.1 Labs (2/19): K 4.7, creatinine 2.46, LDL 78, HDL 26 Labs (11/20): K 4.9, creatinine 1.95 Labs (3/22): K 4.7, creatinine 2.9, BNP 2121  Allergies:  1) ! Erythromycin   Past Medical History:  1. CAD: LHC (5/11) with 50-60% mid LAD, diffuse moderate distal LAD up to 60-70%, small OM2 subtotally occluded, moderate to severe diffuse distal PLOM disease, severe diffuse distal RCA, PDA, and PLV disease. Coronaries were ectatic with slow flow. Patient was started on Plavix given concern for thrombus formation in the ectatic coronaries. He stopped Plavix after starting coumadin with atrial flutter.  2. Chronic systolic CHF: Likely mixed ischemic/nonischemic.  Echo (5/11) with EF 15-20%, severe global hypokinesis, mild MR, moderate diastolic dysfunction, mildly decreased RV systolic function. RHC (5/11) with mean RA 14 mmHg, PA 38/26, mean PCWP 25 mmHg, CI 1.8. Repeat echo (8/11) with EF 0000000, severe diastolic dysfunction, mild MR, RV normal size and systolic function. Per official read, cannot rule out apical clot but it does appear to be only trabeculations. TEE (9/11): EF 15% with  global hypokinesis, mild to moderate MR, no LV thrombus and no LAA thrombus.  Echo (10/12): EF 25%.  Echo (1/17) with EF 20-25%, severe LV dilation, mild aortic stenosis with AVA 1.5 cm^2, moderate MR, moderately dilated RV with moderately decreased RV systolic function, PASP 50 mmHg. Medtronic ICD.  - Echo (1/19): EF 25%, severe LV dilation, mild RV dilation with severely decreased systolic function, mild-moderate MR.  - TEE (2/19): EF 25%, moderately dilated LV, mildly dilated RV with moderately decreased systolic function, moderate LAE/RAE, moderate TR, peak RV-RA gradient 56 mmHg, functionally bicuspid aortic valve without aortic stenosis.  3. Type II diabetes 4. HTN  5. Smoker  6. Severe diabetic neuropathy with chronic gait dysfunction  7. History of left foot ulcer with poor healing  8. Atrial flutter s/p DCCV 9/11  9. Aortic stenosis: Mild by echo in 1/17. 10. Atrial fibrillation: Paroxysmal.  - DCCV to NSR in 2/19.  11. CKD: Stage 3.  Suspect diabetic nephropathy.  12. Functionally bicuspid aortic valve.   Family History:  Family History of Colon CA 1st degree relative <60 9other relative)  Heart disease (grandparent)  Stroke (other relative)  dm only in secondary relatives   Social History:  Pt lives in [place]. 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    Current Outpatient Medications  Medication Sig Dispense Refill  . amiodarone (PACERONE) 200 MG tablet Take 2 tablets (400 mg total) by mouth 2 (two) times daily for 7 days, THEN 1 tablet (200 mg total) 2 (two) times daily for 7 days, THEN 1 tablet (200 mg total) daily. 60 tablet 3  . apixaban (ELIQUIS) 5 MG TABS tablet Take 1 tablet (5 mg total) by mouth 2 (two) times daily. 60 tablet 6  . atorvastatin (LIPITOR) 80 MG tablet Take 1 tablet (80 mg total) by mouth daily. 90 tablet 1  .  carvedilol (COREG) 6.25 MG tablet TAKE 3 TABLETS BY MOUTH TWICE DAILY WITH MEALS 540 tablet 0  . ezetimibe (ZETIA) 10 MG tablet Take 1 tablet (10 mg total) by mouth daily. 90 tablet 3  . glipiZIDE (GLUCOTROL XL) 10 MG 24 hr tablet Take 1-2 tablets (10-20 mg total) by mouth daily with breakfast. Take 2 tablets if blood sugar > 200. 180 tablet 3  . isosorbide mononitrate (IMDUR) 30 MG 24 hr tablet Take 1/2 (one-half) tablet by mouth once daily 45 tablet 0  . Lancets (ACCU-CHEK SOFT TOUCH) lancets Use to test blood sugar once a day E11.9 100 each 3  . spironolactone (ALDACTONE) 25 MG tablet Take 1 tablet (25 mg total) by mouth daily. Please keep upcoming appt in March 2022 before anymore refills. Thank you 30 tablet 0  . torsemide (DEMADEX) 20 MG tablet Take 4 tablets (80 mg total) by mouth daily. 120 tablet 3   No current facility-administered medications for this encounter.    BP 108/68   Pulse 66   Wt 100.7 kg (222  lb)   SpO2 95%   BMI 27.75 kg/m  General: NAD Neck: JVP 10 cm, no thyromegaly or thyroid nodule.  Lungs: Clear to auscultation bilaterally with normal respiratory effort. CV: Nondisplaced PMI.  Heart irregular S1/S2, no S3/S4, no murmur.  1+ ankle edema.  No carotid bruit.  Normal pedal pulses.  Abdomen: Soft, nontender, no hepatosplenomegaly, no distention.  Skin: Intact without lesions or rashes.  Neurologic: Alert and oriented x 3.  Psych: Normal affect. Extremities: No clubbing or cyanosis.  HEENT: Normal.   Assessment/Plan:  1. Atrial fibrillation:  Prior atrial flutter in 2011.  He was noted to be in atrial fibrillation in 2/19 and underwent TEE-guided DCCV to NSR.  DCCV again in 3/22 to NSR. He was back in atrial fibrillation shortly after this.  He does not tolerate AF well with increased RV pacing percentage and worsening HF.  - I do not think that warfarin is a good anticoagulant for him (rarely therapeutic). We are going to try to get him on Eliquis today.   - I  do not think that he will stay in NSR long-term after another cardioversion given cardiomyopathy and failure to hold NSR after 3/22 cardioversion.  Renal dysfunction (and history of poor medication compliance) makes Tikosyn a poor option, ablation may not be an option either given his severely dilated LA.  Therefore, will plan to start him on amiodarone 200 mg bid x 2 wks then 200 mg daily.  Will need usual amiodarone screening.  Will plan TEE-guided DCCV in about 2 wks after amiodarone loaded. Discussed risks/benefits with patient and he agrees to procedure.  - He has appt with EP, but as above, doubt AF ablation will be a viable option.  2. CKD stage 3: Suspect diabetic nephropathy and neuropathy.  - BMET today.   - Refer for nephrology evaluation.  3. Chronic systolic heart failure: Mixed ischemic/nonischemic cardiomyopathy. Has Medtronic ICD.  Last echo in 1/19 with EF 25%, severely dilated LV, severely decreased RV systolic function. He has significant distal vessel CAD on prior cath as well as a history of poorly controlled diabetes. He has NYHA class III symptoms (worse in AF) though not very active due to severe peripheral neuropathy. Today, he is volume overloaded by exam and Optivol even though weight is down with switch to torsemide.  He has had a hard time tolerating medications due to orthostatic symptoms and also with baseline CKD stage 3.  Most recent creatinine up to 2.9.  - Continue Coreg 18.75 mg bid.  - Tolerates AF poorly and needs to get back into NSR as above.  - He was unable to tolerate even low dose losartan and creatinine has been elevated. Will not retry ARB or ARNI at this time.  - Unable to tolerate Bidil 1/2 tab tid due to lightheadedness.  - Continue spironolactone 25 mg daily for now, though may have to stop if K/creatinine rise higher.  - Increase torsemide to 80 mg daily with BMET today and in 1 week.   - GFR too low for SGL2 inhibitor.  - Reassess LV and RV function  with TEE as above.   4. CAD: Diffuse moderate to severe distal vessel disease on last cath with ectatic coronaries with slow flow. No interventional target.  No ischemic chest pain.  - Continue ASA 81 daily.  - Continue atorvastatin, add Zetia to get LDL < 70.   5. Smoking: I strongly encouraged him to quit smoking.   Followup in 3 wks.  [name] 09/23/2020

## 2020-09-23 NOTE — H&P (View-Only) (Signed)
Patient ID: Anthony Rubio., male   DOB: 10/12/1950, 70 y.o.   MRN: [id number] PCP: Dr. Jerline Pain Cardiology: Dr. Aundra Dubin  70 y.o. with history of CAD, mixed ischemic/nonischemic cardiomyopathy, and diabetes who presents for followup. He initially was hospitalized in 5/11 with acute decompensated CHF after several days of shortness of breath, orthopnea, and PND. He was diuresed and left/right heart catheterization was done. Left heart cath showed large, ectatic coronaries with slow flow and moderate to severe diffuse distal vessel disease (consistent with diabetes) without good interventional options. EF was 15-20% by echo. He was put on milrinone and further diuresed for several more days. He was titrated off milrinone and discharged on an oral medication regimen. He later developed atrial flutter requiring cardioversion. He remains in NSR.  Patient had Medtronic ICD placed in 11/11.  Repeat echo in 10/12 showed EF 25%.  Most recent echo in 1/19 showed EF 25% with severe LV dilation. He was noted to be in atrial fibrillation again in 2/19.  He had TEE-guided DCCV in 2/19 back to NSR. He has not been able tolerate even 1/2 tab tid of Bidil due to lightheadedness.   He went into atrial fibrillation in 2/22 with increased RV pacing and worsening CHF.  He had DCCV back to NSR in 3/22. However, it appears that a few days after DCCV, he went back into atrial fibrillation.   He returns for followup of CHF.  He remains in atrial fibrillation today.  He is not very active due to severe diabetic neuropathy, uses a wheelchair when he leaves the house, he uses a walker when walking around the house.  He feels worse when in atrial fibrillation, more fatigued and dyspneic.  He is short of breath walking around the house.  However, on torsemide, he has lost significant weight (13 lbs).  His appetite is poor.  No chest pain.  No orthopnea/PND.  Still smokes a pack every 3-4 days.    Medtronic device reviewed: Optivol with  fluid index > threshold, he is in atrial fibrillation with frequent V-pacing.  ECG (personally reviewed): atrial fibrillation at rate 62, IVCD with intermittent RV pacing.   Labs (9/11): LDL 61, HDL 26, K 4.8, creatinine 1.6  Labs (10/11): K 4.3, creatinine 1.3, BNP 165  Labs (11/11): K 4.9, creatinine 1.6  Labs (12/11): K 4.8, creatinine 1.7  Labs (2/12): LDL 110, HDL 28  Labs (3/12): K 4.1, creatinine 1.7 Labs (6/12): K 4.4, creatinine 1.4, BNP 125 Labs (10/12): K 4.8, creatinine 1.4 Labs (12/12): K 4.4, creatinine 1.5, BNP 20 Labs (3/13): K 5, creatinine 1.5, LDL 43, HDL 31 Labs (8/13): K 4.2, creatinine 1.3, BNP 158 Labs (05/03/14): K 4.6 Creatinine 1.4  Labs (9/16): K 4.4, creatinine 1.5 Labs (5/18): K 4.7, creatinine 1.63, pro-BNP 5249, hgb 14.4 Labs (12/18): K 4.5, creatinine 1.99, LDL 127, HDL 28, hgb 16.1 Labs (2/19): K 4.7, creatinine 2.46, LDL 78, HDL 26 Labs (11/20): K 4.9, creatinine 1.95 Labs (3/22): K 4.7, creatinine 2.9, BNP 2121  Allergies:  1) ! Erythromycin   Past Medical History:  1. CAD: LHC (5/11) with 50-60% mid LAD, diffuse moderate distal LAD up to 60-70%, small OM2 subtotally occluded, moderate to severe diffuse distal PLOM disease, severe diffuse distal RCA, PDA, and PLV disease. Coronaries were ectatic with slow flow. Patient was started on Plavix given concern for thrombus formation in the ectatic coronaries. He stopped Plavix after starting coumadin with atrial flutter.  2. Chronic systolic CHF: Likely mixed ischemic/nonischemic.  Echo (5/11) with EF 15-20%, severe global hypokinesis, mild MR, moderate diastolic dysfunction, mildly decreased RV systolic function. RHC (5/11) with mean RA 14 mmHg, PA 38/26, mean PCWP 25 mmHg, CI 1.8. Repeat echo (8/11) with EF 0000000, severe diastolic dysfunction, mild MR, RV normal size and systolic function. Per official read, cannot rule out apical clot but it does appear to be only trabeculations. TEE (9/11): EF 15% with  global hypokinesis, mild to moderate MR, no LV thrombus and no LAA thrombus.  Echo (10/12): EF 25%.  Echo (1/17) with EF 20-25%, severe LV dilation, mild aortic stenosis with AVA 1.5 cm^2, moderate MR, moderately dilated RV with moderately decreased RV systolic function, PASP 50 mmHg. Medtronic ICD.  - Echo (1/19): EF 25%, severe LV dilation, mild RV dilation with severely decreased systolic function, mild-moderate MR.  - TEE (2/19): EF 25%, moderately dilated LV, mildly dilated RV with moderately decreased systolic function, moderate LAE/RAE, moderate TR, peak RV-RA gradient 56 mmHg, functionally bicuspid aortic valve without aortic stenosis.  3. Type II diabetes 4. HTN  5. Smoker  6. Severe diabetic neuropathy with chronic gait dysfunction  7. History of left foot ulcer with poor healing  8. Atrial flutter s/p DCCV 9/11  9. Aortic stenosis: Mild by echo in 1/17. 10. Atrial fibrillation: Paroxysmal.  - DCCV to NSR in 2/19.  11. CKD: Stage 3.  Suspect diabetic nephropathy.  12. Functionally bicuspid aortic valve.   Family History:  Family History of Colon CA 1st degree relative <60 9other relative)  Heart disease (grandparent)  Stroke (other relative)  dm only in secondary relatives   Social History:  Pt lives in [place]. 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    Current Outpatient Medications  Medication Sig Dispense Refill  . amiodarone (PACERONE) 200 MG tablet Take 2 tablets (400 mg total) by mouth 2 (two) times daily for 7 days, THEN 1 tablet (200 mg total) 2 (two) times daily for 7 days, THEN 1 tablet (200 mg total) daily. 60 tablet 3  . apixaban (ELIQUIS) 5 MG TABS tablet Take 1 tablet (5 mg total) by mouth 2 (two) times daily. 60 tablet 6  . atorvastatin (LIPITOR) 80 MG tablet Take 1 tablet (80 mg total) by mouth daily. 90 tablet 1  .  carvedilol (COREG) 6.25 MG tablet TAKE 3 TABLETS BY MOUTH TWICE DAILY WITH MEALS 540 tablet 0  . ezetimibe (ZETIA) 10 MG tablet Take 1 tablet (10 mg total) by mouth daily. 90 tablet 3  . glipiZIDE (GLUCOTROL XL) 10 MG 24 hr tablet Take 1-2 tablets (10-20 mg total) by mouth daily with breakfast. Take 2 tablets if blood sugar > 200. 180 tablet 3  . isosorbide mononitrate (IMDUR) 30 MG 24 hr tablet Take 1/2 (one-half) tablet by mouth once daily 45 tablet 0  . Lancets (ACCU-CHEK SOFT TOUCH) lancets Use to test blood sugar once a day E11.9 100 each 3  . spironolactone (ALDACTONE) 25 MG tablet Take 1 tablet (25 mg total) by mouth daily. Please keep upcoming appt in March 2022 before anymore refills. Thank you 30 tablet 0  . torsemide (DEMADEX) 20 MG tablet Take 4 tablets (80 mg total) by mouth daily. 120 tablet 3   No current facility-administered medications for this encounter.    BP 108/68   Pulse 66   Wt 100.7 kg (222  lb)   SpO2 95%   BMI 27.75 kg/m  General: NAD Neck: JVP 10 cm, no thyromegaly or thyroid nodule.  Lungs: Clear to auscultation bilaterally with normal respiratory effort. CV: Nondisplaced PMI.  Heart irregular S1/S2, no S3/S4, no murmur.  1+ ankle edema.  No carotid bruit.  Normal pedal pulses.  Abdomen: Soft, nontender, no hepatosplenomegaly, no distention.  Skin: Intact without lesions or rashes.  Neurologic: Alert and oriented x 3.  Psych: Normal affect. Extremities: No clubbing or cyanosis.  HEENT: Normal.   Assessment/Plan:  1. Atrial fibrillation:  Prior atrial flutter in 2011.  He was noted to be in atrial fibrillation in 2/19 and underwent TEE-guided DCCV to NSR.  DCCV again in 3/22 to NSR. He was back in atrial fibrillation shortly after this.  He does not tolerate AF well with increased RV pacing percentage and worsening HF.  - I do not think that warfarin is a good anticoagulant for him (rarely therapeutic). We are going to try to get him on Eliquis today.   - I  do not think that he will stay in NSR long-term after another cardioversion given cardiomyopathy and failure to hold NSR after 3/22 cardioversion.  Renal dysfunction (and history of poor medication compliance) makes Tikosyn a poor option, ablation may not be an option either given his severely dilated LA.  Therefore, will plan to start him on amiodarone 200 mg bid x 2 wks then 200 mg daily.  Will need usual amiodarone screening.  Will plan TEE-guided DCCV in about 2 wks after amiodarone loaded. Discussed risks/benefits with patient and he agrees to procedure.  - He has appt with EP, but as above, doubt AF ablation will be a viable option.  2. CKD stage 3: Suspect diabetic nephropathy and neuropathy.  - BMET today.   - Refer for nephrology evaluation.  3. Chronic systolic heart failure: Mixed ischemic/nonischemic cardiomyopathy. Has Medtronic ICD.  Last echo in 1/19 with EF 25%, severely dilated LV, severely decreased RV systolic function. He has significant distal vessel CAD on prior cath as well as a history of poorly controlled diabetes. He has NYHA class III symptoms (worse in AF) though not very active due to severe peripheral neuropathy. Today, he is volume overloaded by exam and Optivol even though weight is down with switch to torsemide.  He has had a hard time tolerating medications due to orthostatic symptoms and also with baseline CKD stage 3.  Most recent creatinine up to 2.9.  - Continue Coreg 18.75 mg bid.  - Tolerates AF poorly and needs to get back into NSR as above.  - He was unable to tolerate even low dose losartan and creatinine has been elevated. Will not retry ARB or ARNI at this time.  - Unable to tolerate Bidil 1/2 tab tid due to lightheadedness.  - Continue spironolactone 25 mg daily for now, though may have to stop if K/creatinine rise higher.  - Increase torsemide to 80 mg daily with BMET today and in 1 week.   - GFR too low for SGL2 inhibitor.  - Reassess LV and RV function  with TEE as above.   4. CAD: Diffuse moderate to severe distal vessel disease on last cath with ectatic coronaries with slow flow. No interventional target.  No ischemic chest pain.  - Continue ASA 81 daily.  - Continue atorvastatin, add Zetia to get LDL < 70.   5. Smoking: I strongly encouraged him to quit smoking.   Followup in 3 wks.  [name] 09/23/2020

## 2020-09-23 NOTE — Patient Instructions (Addendum)
Start Amiodarone:   Take 2 tabs (400 mg) Twice daily FOR 1 WEEK ONLY, then  Take 1 tab (200 mg) Twice daily FOR 1 WEEK, then  Take 1 tab (200 mg) Daily  Increase Torsemide to 80 mg (4 tabs) Daily  Start Zetia 10 mg Daily  Stop Coumadin  We will call and let you know when to start the Eliquis 5 mg Twice daily   Labs done today, your results will be available in MyChart, we will contact you for abnormal readings.  Your physician recommends that you return for lab work in: 1 week  Your physician has requested that you have a TEE/Cardioversion. During a TEE, sound waves are used to create images of your heart. It provides your doctor with information about the size and shape of your heart and how well your heart's chambers and valves are working. In this test, a transducer is attached to the end of a flexible tube that is guided down you throat and into your esophagus (the tube leading from your mouth to your stomach) to get a more detailed image of your heart. Once the TEE has determined that a blood clot is not present, the cardioversion begins. Electrical Cardioversion uses a jolt of electricity to your heart either through paddles or wired patches attached to your chest. This is a controlled, usually prescheduled, procedure. This procedure is done at the hospital and you are not awake during the procedure. You usually go home the day of the procedure. Please see the instruction sheet given to you today for more information. SEE INSTRUCTIONS BELOW  You have been referred to [place], they will call you for an appointment  Your physician recommends that you schedule a follow-up appointment in: 3-4 weeks  If you have any questions or concerns before your next appointment please send [place] a message through mychart or call our office at [phone].    TO LEAVE A MESSAGE FOR THE NURSE SELECT OPTION 2, PLEASE LEAVE A MESSAGE INCLUDING: . YOUR NAME . DATE OF BIRTH . CALL BACK  NUMBER . REASON FOR CALL**this is important as we prioritize the call backs  [place] AS LONG AS YOU CALL BEFORE 4:00 PM  At the [facility], you and your health needs are our priority. As part of our continuing mission to provide you with exceptional heart care, we have created designated Provider Care Teams. These Care Teams include your primary Cardiologist (physician) and Advanced Practice Providers (APPs- Physician Assistants and Nurse Practitioners) who all work together to provide you with the care you need, when you need it.   You may see any of the following providers on your designated Care Team at your next follow up: Marland Kitchen Dr Glori Bickers . Dr [name] . Dr Vickki Muff . Darrick Grinder, NP . Lyda Jester, [place] . Audry Riles, PharmD   Please be sure to bring in all your medications bottles to every appointment.     CARDIOVERSION INSTRUCTIONS:   You are scheduled for a TEE Cardioversion on Thur 10/10/20 with Dr. Aundra Dubin.  Please arrive at the [facility] (Main Entrance A) at [facility]: [address] [place], [place] [address] at 6:30 am  DIET: Nothing to eat or drink after midnight except a sip of water with medications (see medication instructions below)  Medication Instructions: Hold Torsemide, Spironolactone, and Glipizide Thur 4/7 AM  Continue your anticoagulant: Eliquis   COVID  TEST: Tuesday 10/08/20 AT 8:15 AM, this is a drive thru test located at:   [address], [place] must have a responsible person to drive you home and stay in the waiting area during your procedure. Failure to do so could result in cancellation.  Bring your insurance cards.  *Special Note: Every effort is made to have your procedure done on time. Occasionally there are emergencies that occur at the hospital that may cause delays. Please be patient if a delay does occur.

## 2020-09-24 ENCOUNTER — Other Ambulatory Visit (HOSPITAL_COMMUNITY): Payer: Self-pay | Admitting: *Deleted

## 2020-09-24 ENCOUNTER — Telehealth (HOSPITAL_COMMUNITY): Payer: Self-pay | Admitting: Pharmacy Technician

## 2020-09-24 DIAGNOSIS — I480 Paroxysmal atrial fibrillation: Secondary | ICD-10-CM

## 2020-09-24 NOTE — Telephone Encounter (Signed)
Sent in [facility] application via fax.  Will follow up.

## 2020-09-29 NOTE — Progress Notes (Addendum)
Electrophysiology Office Note Date: 09/30/2020  ID:  Anthony Polak., DOB [birth year], MRN [id number]  PCP: Vivi Barrack, MD Primary Cardiologist: No primary care provider on file. Electrophysiologist: Anthony Grayer, MD   CC: Routine ICD follow-up  Anthony Mamone. is a 70 y.o. male seen today for Anthony Grayer, MD for routine electrophysiology followup.  Since last being seen in our clinic the patient reports doing well.   Pt had went into AF/AFL with increased RV pacing and worsening CHF 08/2020. He underwent DCCV 09/12/20 but went back out of rhythm several days after. He is now being loaded on Amiodarone. He has renal dysfunction and poor overall compliance and felt to be a poor candidate for Tikosyn.   he denies chest pain, palpitations, PND, orthopnea, nausea, vomiting, syncope, edema, weight gain, or early satiety. He has not had ICD shocks.   He was mildly lightheaded this am. Not able to be very active, mostly sedentary. ++fatigue.   Device History: Medtronic Dual Chamber ICD implanted 05/2010, with gen change 01/2018 for ERI for chronic systolic CHF History of appropriate therapy: No History of AAD therapy: No  Past Medical History:  Diagnosis Date  . AICD (automatic cardioverter/defibrillator) present   . Aortic stenosis    a. Mild by echo 04/2011.   . Arthritis   . Atrial flutter ([place])    s/p DCCV 9/11  . CAD (coronary artery disease)    a. LHC (5/11) with 50-60% mid LAD, diffuse mod distal LAD up to 60-70%, small OM2 subtotally occ, mod-severe diffuse distal PLOM, severe diffuse distal RCA, PDA, and PLV  disease. Started on Plavix given concern for thrombus formation in the ectatic cors with slow flow. b. Stopped Plavix after starting Coumadin for a-flutter.  . CHF (congestive heart failure) ([place])    a. Likely mixed ICM/NICM. Echo (5/11) with EF 15-20%, severe global HK, mild MR, mod d/d, mildly decreased RV fcn. RHC (5/11) with mean RA 14 mmHg, PA 38/26, mean PCWP 25  mmHg, CI 1.8.  b. TEE (9/11): EF 15% no LV thrombus, no LAA thrombus. c. s/p ICD 11/11. d. Last echo 04/2011: LV severely dilated, EF 25%, diffuse HK, mild AS, mildly dilated LA, trivial pericardial effusion.  . CKD (chronic kidney disease), stage III ([place])   . Complication of anesthesia    Slow to awaken  . Diabetic neuropathy (HCC)    severe; with chronic gait dysfunction  . Dysrhythmia   . Foot ulcer, left ([place])    poor healing  . GERD (gastroesophageal reflux disease)    H.pyloric  . History of echocardiogram 07/2015   Echo 1/17: EF 20-25%, inf-lat AK, restrictive physio, mild AS (mean 9 mmHg), mild AI, mod MR, severe LAE, mod reduced RVSF, mild RAE, mild TR, PASP 50 mmHg  . HTN (hypertension)   . ICD (implantable cardiac defibrillator) in place 11/11   Medtronic dual cha,ber ICD with Optivol  . Neuropathy associated with endocrine disorder (HCC)    feet anad finger tips  . Tobacco abuse   . Uncontrolled type II diabetes mellitus ([place])    Past Surgical History:  Procedure Laterality Date  . CARDIOVERSION N/A 08/23/2017   Procedure: CARDIOVERSION;  Surgeon: Larey Dresser, MD;  Location: [facility] ENDOSCOPY;  Service: Cardiovascular;  Laterality: N/A;  . CARDIOVERSION N/A 09/12/2020   Procedure: CARDIOVERSION;  Surgeon: Larey Dresser, MD;  Location[facility] END[facility]COPY;  Service: Cardiovascular;  Laterality: N/A;  STAT INR!!!  . COLONOSCOPY    .  COLONOSCOPY WITH PROPOFOL N/A 12/01/2017   Procedure: COLONOSCOPY WITH PROPOFOL;  Surgeon: Arta Silence, MD;  Location: [facility];  Service: Endoscopy;  Laterality: N/A;  . ESOPHAGOGASTRODUODENOSCOPY    . ICD GENERATOR CHANGEOUT N/A 02/01/2018   Procedure: ICD GENERATOR CHANGEOUT;  Surgeon: Anthony Grayer, MD;  Location: [facility] CV LAB;  Service: Cardiovascular;  Laterality: N/A;  . ICD implantation  2011   by JA  . TEE WITHOUT CARDIOVERSION N/A 08/23/2017   Procedure: TRANSESOPHAGEAL ECHOCARDIOGRAM (TEE);  Surgeon: Larey Dresser, MD;   Location: [facility] ENDOSCOPY;  Service: Cardiovascular;  Laterality: N/A;    Current Outpatient Medications  Medication Sig Dispense Refill  . amiodarone (PACERONE) 200 MG tablet Take 2 tablets (400 mg total) by mouth 2 (two) times daily for 7 days, THEN 1 tablet (200 mg total) 2 (two) times daily for 7 days, THEN 1 tablet (200 mg total) daily. 60 tablet 3  . apixaban (ELIQUIS) 5 MG TABS tablet Take 1 tablet (5 mg total) by mouth 2 (two) times daily. 60 tablet 6  . atorvastatin (LIPITOR) 80 MG tablet Take 1 tablet (80 mg total) by mouth daily. 90 tablet 1  . carvedilol (COREG) 6.25 MG tablet TAKE 3 TABLETS BY MOUTH TWICE DAILY WITH MEALS 540 tablet 0  . ezetimibe (ZETIA) 10 MG tablet Take 1 tablet (10 mg total) by mouth daily. 90 tablet 3  . glipiZIDE (GLUCOTROL XL) 10 MG 24 hr tablet Take 1-2 tablets (10-20 mg total) by mouth daily with breakfast. Take 2 tablets if blood sugar > 200. 180 tablet 3  . isosorbide mononitrate (IMDUR) 30 MG 24 hr tablet Take 1/2 (one-half) tablet by mouth once daily 45 tablet 0  . Lancets (ACCU-CHEK SOFT TOUCH) lancets Use to test blood sugar once a day E11.9 100 each 3  . spironolactone (ALDACTONE) 25 MG tablet Take 1 tablet (25 mg total) by mouth daily. Please keep upcoming appt in March 2022 before anymore refills. Thank you 30 tablet 0  . torsemide (DEMADEX) 20 MG tablet Take 4 tablets (80 mg total) by mouth daily. 120 tablet 3   No current facility-administered medications for this visit.    Allergies:   Erythromycin, Hydralazine, Lisinopril, and Losartan potassium   Social History: Social History   Socioeconomic History  . Marital status: Married    Spouse name: Not on file  . Number of children: Not on file  . Years of education: Not on file  . Highest education level: Not on file  Occupational History  . Occupation: Retired Barrister's clerk  . Occupation: Former Retail buyer  Tobacco Use  . Smoking status: Current Some Day Smoker    Packs/day: 0.25     Years: 33.00    Pack years: 8.25    Types: Cigarettes  . Smokeless tobacco: Never Used  . Tobacco comment: about 1/4 ppd. Married, lives with wife. Pt lives in [employer] [place]. Retired Brewing technologist  . Vaping Use: Never used  Substance and Sexual Activity  . Alcohol use: No  . Drug use: No  . Sexual activity: Not Currently  Other Topics Concern  . Not on file  Social History Narrative  . Not on file   Social Determinants of Health   Financial Resource Strain: Not on file  Food Insecurity: Not on file  Transportation Needs: Not on file  Physical Activity: Not on file  Stress: Not on file  Social Connections: Not on file  Intimate Partner Violence: Not on file  Family History: Family History  Problem Relation Age of Onset  . Stroke Mother   . Early death Father   . Congestive Heart Failure Maternal Grandmother   . Gout Maternal Grandfather   . Diabetes Paternal Grandfather   . Cancer Paternal Grandfather   . Heart attack Son   . Early death Son     Review of Systems: All other systems reviewed and are otherwise negative except as noted above.   Physical Exam: Vitals:   09/30/20 0816  BP: (!) 80/60  Pulse: 61  SpO2: 96%  Weight: 220 lb (99.8 kg)  Height: '6\' 3"'$  (1.905 m)     GEN- The patient is well appearing, alert and oriented x 3 today.   HEENT: normocephalic, atraumatic; sclera clear, conjunctiva pink; hearing intact; oropharynx clear; neck supple, no JVP Lymph- no cervical lymphadenopathy Lungs- Clear to ausculation bilaterally, normal work of breathing.  No wheezes, rales, rhonchi Heart- Regular rate and rhythm, no murmurs, rubs or gallops, PMI not laterally displaced GI- soft, non-tender, non-distended, bowel sounds present, no hepatosplenomegaly Extremities- no clubbing or cyanosis. No edema; DP/PT/radial pulses 2+ bilaterally MS- no significant deformity or atrophy Skin- warm and dry, no rash or lesion; ICD pocket well  healed Psych- euthymic mood, full affect Neuro- strength and sensation are intact  ICD interrogation- reviewed in detail today,  See PACEART report  EKG:  EKG is not ordered today.  Recent Labs: 09/09/2020: B Natriuretic Peptide 2,121.4 09/23/2020: BUN 60; Creatinine, Ser 2.59; Hemoglobin 15.8; Platelets 119; Potassium 4.3; Sodium 140   Wt Readings from Last 3 Encounters:  09/30/20 220 lb (99.8 kg)  09/23/20 222 lb (100.7 kg)  09/12/20 235 lb 9.6 oz (106.9 kg)     Other studies Reviewed: Additional studies/ records that were reviewed today include: Previous EP and HF notes   Assessment and Plan:  1.  Chronic systolic dysfunction s/p Medtronic dual chamber ICD  euvolemic to dry today. Hypotensive but NOT orthostatic (80 systolic sitting, 78 systolic standing.  Stable on an appropriate medical regimen Normal ICD function See Pace Art report No device changes today We have previously discussed barostim procedure. He is unable to weight bear for very long. He would be a poor candidate as would be unlikely to be able to show an improvement in functional status.   2. HTN Hypotensive today on current regiment.   3. CAD Denies ischemic symptoms  4. Atrial flutter/atrial fibrillation Pt on eliquis for CHA2DS2VASC of at least 5.   Had DCCV 09/12/20 but back out of rhythm within several days. Now on amiodarone and pending TEE/DCC.  He has severe bilateral atrial dilation, so likely a poor candidate for ablation. Will have him see Dr. Rayann Heman in 2-3 months to discuss.  Would perform amio surveillance labs in 6 weeks.   Current medicines are reviewed at length with the patient today.   The patient does not have concerns regarding his medicines.  The following changes were made today:  Decrease coreg and spiro. Hold torsemide today. If symptoms persist may need to cut back.   Labs/ tests ordered today include:  No orders of the defined types were placed in this  encounter.  Disposition:   Follow up with EP APP  6 months  Signed, Shirley Friar, PA-C  09/30/2020 8:45 AM  [facility] [address] [place] [address] [phone] (office) [phone] (fax)

## 2020-09-30 ENCOUNTER — Other Ambulatory Visit (HOSPITAL_COMMUNITY): Payer: Medicare Other

## 2020-09-30 ENCOUNTER — Encounter: Payer: Self-pay | Admitting: Student

## 2020-09-30 ENCOUNTER — Other Ambulatory Visit: Payer: Self-pay

## 2020-09-30 ENCOUNTER — Ambulatory Visit (INDEPENDENT_AMBULATORY_CARE_PROVIDER_SITE_OTHER): Payer: Medicare Other

## 2020-09-30 ENCOUNTER — Ambulatory Visit (INDEPENDENT_AMBULATORY_CARE_PROVIDER_SITE_OTHER): Payer: Medicare Other | Admitting: Student

## 2020-09-30 VITALS — BP 80/60 | HR 61 | Ht 75.0 in | Wt 220.0 lb

## 2020-09-30 DIAGNOSIS — I10 Essential (primary) hypertension: Secondary | ICD-10-CM | POA: Diagnosis not present

## 2020-09-30 DIAGNOSIS — I5022 Chronic systolic (congestive) heart failure: Secondary | ICD-10-CM

## 2020-09-30 DIAGNOSIS — I2510 Atherosclerotic heart disease of native coronary artery without angina pectoris: Secondary | ICD-10-CM

## 2020-09-30 DIAGNOSIS — Z95810 Presence of automatic (implantable) cardiac defibrillator: Secondary | ICD-10-CM | POA: Diagnosis not present

## 2020-09-30 DIAGNOSIS — I480 Paroxysmal atrial fibrillation: Secondary | ICD-10-CM

## 2020-09-30 LAB — CUP PACEART INCLINIC DEVICE CHECK
Battery Remaining Longevity: 88 mo
Battery Voltage: 2.97 V
Brady Statistic RA Percent Paced: 0 %
Brady Statistic RV Percent Paced: 94.47 %
Date Time Interrogation Session: 20220328084754
HighPow Impedance: 43 Ohm
HighPow Impedance: 50 Ohm
Implantable Lead Implant Date: 20111108
Implantable Lead Implant Date: 20111108
Implantable Lead Location: 753859
Implantable Lead Location: 753860
Implantable Lead Model: 5076
Implantable Lead Model: 6947
Implantable Pulse Generator Implant Date: 20190730
Lead Channel Impedance Value: 285 Ohm
Lead Channel Impedance Value: 342 Ohm
Lead Channel Impedance Value: 513 Ohm
Lead Channel Pacing Threshold Amplitude: 0.625 V
Lead Channel Pacing Threshold Amplitude: 0.875 V
Lead Channel Pacing Threshold Pulse Width: 0.4 ms
Lead Channel Pacing Threshold Pulse Width: 0.4 ms
Lead Channel Sensing Intrinsic Amplitude: 1.75 mV
Lead Channel Sensing Intrinsic Amplitude: 1.875 mV
Lead Channel Sensing Intrinsic Amplitude: 4 mV
Lead Channel Sensing Intrinsic Amplitude: 4.375 mV
Lead Channel Setting Pacing Amplitude: 2 V
Lead Channel Setting Pacing Amplitude: 2.5 V
Lead Channel Setting Pacing Pulse Width: 0.4 ms
Lead Channel Setting Sensing Sensitivity: 0.3 mV

## 2020-09-30 LAB — CBC
Hematocrit: 47.8 % (ref 37.5–51.0)
Hemoglobin: 15.2 g/dL (ref 13.0–17.7)
MCH: 28.8 pg (ref 26.6–33.0)
MCHC: 31.8 g/dL (ref 31.5–35.7)
MCV: 91 fL (ref 79–97)
Platelets: 140 10*3/uL — ABNORMAL LOW (ref 150–450)
RBC: 5.28 x10E6/uL (ref 4.14–5.80)
RDW: 16 % — ABNORMAL HIGH (ref 11.6–15.4)
WBC: 5 10*3/uL (ref 3.4–10.8)

## 2020-09-30 LAB — BASIC METABOLIC PANEL
BUN/Creatinine Ratio: 20 (ref 10–24)
BUN: 62 mg/dL — ABNORMAL HIGH (ref 8–27)
CO2: 20 mmol/L (ref 20–29)
Calcium: 9.1 mg/dL (ref 8.6–10.2)
Chloride: 101 mmol/L (ref 96–106)
Creatinine, Ser: 3.08 mg/dL — ABNORMAL HIGH (ref 0.76–1.27)
Glucose: 101 mg/dL — ABNORMAL HIGH (ref 65–99)
Potassium: 4.4 mmol/L (ref 3.5–5.2)
Sodium: 140 mmol/L (ref 134–144)
eGFR: 21 mL/min/{1.73_m2} — ABNORMAL LOW (ref >59)

## 2020-09-30 MED ORDER — SPIRONOLACTONE 25 MG PO TABS: 12.5000 mg | ORAL_TABLET | Freq: Every day | ORAL | 1 refills | Status: DC

## 2020-09-30 MED ORDER — CARVEDILOL 6.25 MG PO TABS: 12.5000 mg | ORAL_TABLET | Freq: Two times a day (BID) | ORAL | 1 refills | Status: DC

## 2020-09-30 NOTE — Patient Instructions (Signed)
Medication Instructions:  Your physician has recommended you make the following change in your medication:   DECREASE: Carvedilol to 12.'5mg'$  twice daily DECREASE: Spironolactone to 12.'5mg'$  daily DO NOT TAKE TORSEMIDE TODAY ONLY!! DO NOT TAKE CARVEDILOL THIS MORNING!!  *If you need a refill on your cardiac medications before your next appointment, please call your pharmacy*   Lab Work: TODAY: BMET, CBC  If you have labs (blood work) drawn today and your tests are completely normal, you will receive your results only by: Marland Kitchen MyChart Message (if you have MyChart) OR . A paper copy in the mail If you have any lab test that is abnormal or we need to change your treatment, we will call you to review the results.  Follow-Up: At [facility], you and your health needs are our priority.  As part of our continuing mission to provide you with exceptional heart care, we have created designated Provider Care Teams.  These Care Teams include your primary Cardiologist (physician) and Advanced Practice Providers (APPs -  Physician Assistants and Nurse Practitioners) who all work together to provide you with the care you need, when you need it.  Your next appointment:   As scheduled with Dr Rayann Heman

## 2020-10-01 ENCOUNTER — Telehealth (HOSPITAL_COMMUNITY): Payer: Self-pay | Admitting: Pharmacy Technician

## 2020-10-01 NOTE — Telephone Encounter (Signed)
Received communication from [place] that the patient was over the income for a household of one. Called and spoke with the patient, who is actually a household of 2. He is going to email me POI, will send that in to BMS for redetermination.

## 2020-10-02 ENCOUNTER — Telehealth (HOSPITAL_COMMUNITY): Payer: Self-pay

## 2020-10-02 DIAGNOSIS — I5022 Chronic systolic (congestive) heart failure: Secondary | ICD-10-CM

## 2020-10-02 MED ORDER — TORSEMIDE 20 MG PO TABS: 60.0000 mg | ORAL_TABLET | Freq: Every day | ORAL | 3 refills | Status: DC

## 2020-10-02 NOTE — Telephone Encounter (Signed)
Patient advised and verbalized understanding. Med list updated to reflect changes. Lab appt scheduled, lab order entered.  Orders Placed This Encounter  Procedures  . Basic metabolic panel    Standing Status:   Future    Standing Expiration Date:   10/02/2021    Order Specific Question:   Release to patient    Answer:   Immediate   Meds ordered this encounter  Medications  . torsemide (DEMADEX) 20 MG tablet    Sig: Take 3 tablets (60 mg total) by mouth daily.    Dispense:  90 tablet    Refill:  3    Please cancel all previous orders for current medication. Change in dosage or pill size.

## 2020-10-02 NOTE — Telephone Encounter (Signed)
-----   Message from Larey Dresser, MD sent at 10/01/2020 12:07 PM EDT ----- Hold torsemide for a day then decrease to 60 mg daily.  BMET Monday.

## 2020-10-02 NOTE — Progress Notes (Signed)
EPIC Encounter for ICM Monitoring  Patient Name: Anthony Rubio. is a 70 y.o. male Date: 10/02/2020 Primary Care Physican: Vivi Barrack, MD Primary Cardiologist:[place] Electrophysiologist: Allred 3/28/2022Weight:227- 228lbs  Clinical Status (23-Sep-2020 to 30-Sep-2020) Time in AT/AF 24.0 hr/day (100.0%) Longest AT/AF 15 days    Spoke with patient and reports feeling pretty good today.  Weight has returned to baseline   Pt uses wheelchair outside the home and walker inside the home.  Optivol thoracic impedancesuggestingfluid levels have returned to normal.  Per Dr Claris Gladden 3/21 office note, he had 09/12/2020 cardioversion but failed to hold NSR.  Prescribed: Furosemide to 40 mg take 4 tablets (80 mg total) tablet by mouth daily. Spironolactone 25 mg take 0.5 tablet (12.5 mg total) by mouth daily.   Labs: 09/30/2020 Creatinine 3.08, BUN 62, Potassium 4.4, Sodium 140 09/23/2020 Creatinine 2.59, BUN 60, Potassium 4.3, Sodium 140, GFR 26  09/09/2020 Creatinine 2.90, BUN 61, Potassium 4.7, Sodium 140, GFR 22  A complete set of results can be found in Results Review  Recommendations:  Encouraged to call if experiencing any fluid symptoms.   Follow-up plan: ICM clinic phone appointment on5/09/2020. 91 day device clinic remote transmission5/08/2020.   EP/Cardiology Office Visits:10/17/2020 with HF clinic.11/27/2020 with Dr Rayann Heman.  Copy of ICM check sent to Dr.Allred.   3 month ICM trend: 09/30/2020.    1 Year ICM trend:       [name], RN 10/02/2020 10:34 AM

## 2020-10-07 ENCOUNTER — Other Ambulatory Visit: Payer: Self-pay

## 2020-10-07 ENCOUNTER — Ambulatory Visit (HOSPITAL_COMMUNITY)
Admission: RE | Admit: 2020-10-07 | Discharge: 2020-10-07 | Disposition: A | Discharge status: Home / Self Care | Payer: Medicare Other | Source: Ambulatory Visit | Attending: Internal Medicine | Admitting: Internal Medicine

## 2020-10-07 DIAGNOSIS — I5022 Chronic systolic (congestive) heart failure: Secondary | ICD-10-CM | POA: Insufficient documentation

## 2020-10-07 LAB — HEPATIC FUNCTION PANEL
ALT: 30 U/L (ref 0–44)
AST: 24 U/L (ref 15–41)
Albumin: 3.1 g/dL — ABNORMAL LOW (ref 3.5–5.0)
Alkaline Phosphatase: 112 U/L (ref 38–126)
Bilirubin, Direct: 0.8 mg/dL — ABNORMAL HIGH (ref 0.0–0.2)
Indirect Bilirubin: 1.3 mg/dL — ABNORMAL HIGH (ref 0.3–0.9)
Total Bilirubin: 2.1 mg/dL — ABNORMAL HIGH (ref 0.3–1.2)
Total Protein: 6.3 g/dL — ABNORMAL LOW (ref 6.5–8.1)

## 2020-10-07 LAB — LIPID PANEL
Cholesterol: 58 mg/dL (ref 0–200)
HDL: 24 mg/dL — ABNORMAL LOW (ref >40)
LDL Cholesterol: 25 mg/dL (ref 0–99)
Total CHOL/HDL Ratio: 2.4 RATIO
Triglycerides: 45 mg/dL (ref <150)
VLDL: 9 mg/dL (ref 0–40)

## 2020-10-07 LAB — BASIC METABOLIC PANEL
Anion gap: 10 (ref 5–15)
BUN: 57 mg/dL — ABNORMAL HIGH (ref 8–23)
CO2: 26 mmol/L (ref 22–32)
Calcium: 8.9 mg/dL (ref 8.9–10.3)
Chloride: 101 mmol/L (ref 98–111)
Creatinine, Ser: 3.16 mg/dL — ABNORMAL HIGH (ref 0.61–1.24)
GFR, Estimated: 20 mL/min — ABNORMAL LOW (ref >60)
Glucose, Bld: 168 mg/dL — ABNORMAL HIGH (ref 70–99)
Potassium: 4.5 mmol/L (ref 3.5–5.1)
Sodium: 137 mmol/L (ref 135–145)

## 2020-10-08 ENCOUNTER — Other Ambulatory Visit (HOSPITAL_COMMUNITY)
Admission: RE | Admit: 2020-10-08 | Discharge: 2020-10-08 | Disposition: A | Discharge status: Home / Self Care | Payer: Medicare Other | Source: Ambulatory Visit | Attending: Cardiology | Admitting: Cardiology

## 2020-10-08 DIAGNOSIS — Z01812 Encounter for preprocedural laboratory examination: Secondary | ICD-10-CM | POA: Diagnosis not present

## 2020-10-08 DIAGNOSIS — Z20822 Contact with and (suspected) exposure to covid-19: Secondary | ICD-10-CM | POA: Diagnosis not present

## 2020-10-08 LAB — SARS CORONAVIRUS 2 (TAT 6-24 HRS): SARS Coronavirus 2: NEGATIVE

## 2020-10-09 ENCOUNTER — Telehealth (HOSPITAL_COMMUNITY): Payer: Self-pay

## 2020-10-09 MED ORDER — TORSEMIDE 20 MG PO TABS: ORAL_TABLET | ORAL | 3 refills | Status: DC

## 2020-10-09 NOTE — Anesthesia Preprocedure Evaluation (Addendum)
Anesthesia Evaluation  Patient identified by MRN, date of birth, ID band Patient awake    Reviewed: Allergy & Precautions, NPO status , Patient's Chart, lab work & pertinent test results, reviewed documented beta blocker date and time   History of Anesthesia Complications (+) PROLONGED EMERGENCE and history of anesthetic complications  Airway Mallampati: II  TM Distance: >3 FB Neck ROM: Full    Dental  (+) Dental Advisory Given, Missing   Pulmonary Current Smoker and Patient abstained from smoking.,    Pulmonary exam normal breath sounds clear to auscultation       Cardiovascular hypertension, Pt. on home beta blockers and Pt. on medications + CAD and +CHF  Normal cardiovascular exam+ dysrhythmias Atrial Fibrillation + Cardiac Defibrillator + Valvular Problems/Murmurs AS, AI and MR  Rhythm:Regular Rate:Normal  Echo 06/21/20: 1. Left ventricular ejection fraction, by estimation, is <20%. The left  ventricle has severely decreased function. The left ventricle demonstrates  global hypokinesis. The left ventricular internal cavity size was severely  dilated. There is mild left  ventricular hypertrophy. Left ventricular diastolic parameters are  indeterminate.  2. Right ventricular systolic function is severely reduced. The right  ventricular size is moderately enlarged. There is moderately elevated  pulmonary artery systolic pressure. The estimated right ventricular  systolic pressure is 0000000 mmHg.  3. Left atrial size was severely dilated.  4. Right atrial size was severely dilated.  5. The mitral valve is grossly normal. Mild to moderate mitral valve  regurgitation.  6. Tricuspid valve regurgitation is mild to moderate.  7. The aortic valve is tricuspid. There is moderate calcification of the  aortic valve. There is moderate thickening of the aortic valve. Aortic  valve regurgitation is mild.  8. Aortic dilatation  noted. There is mild dilatation of the ascending  aorta, measuring 40 mm.    Neuro/Psych  Neuromuscular disease    GI/Hepatic Neg liver ROS, GERD  ,  Endo/Other  diabetes, Type 2, Oral Hypoglycemic Agents  Renal/GU Renal InsufficiencyRenal disease     Musculoskeletal  (+) Arthritis ,   Abdominal   Peds  Hematology  (+) Blood dyscrasia (Eliquis), ,   Anesthesia Other Findings   Reproductive/Obstetrics                            Anesthesia Physical Anesthesia Plan  ASA: IV  Anesthesia Plan: General   Post-op Pain Management:    Induction: Intravenous  PONV Risk Score and Plan: 1 and Treatment may vary due to age or medical condition and Propofol infusion  Airway Management Planned: Mask  Additional Equipment:   Intra-op Plan:   Post-operative Plan:   Informed Consent: I have reviewed the patients History and Physical, chart, labs and discussed the procedure including the risks, benefits and alternatives for the proposed anesthesia with the patient or authorized representative who has indicated his/her understanding and acceptance.     Dental advisory given  Plan Discussed with: CRNA  Anesthesia Plan Comments:        Anesthesia Quick Evaluation

## 2020-10-09 NOTE — Telephone Encounter (Signed)
Pt aware of results. Pt will alternate '40mg'$  with '60mg'$  of torsemide every other day. Pt will repeat blood work at next office visit on 4/14. Verbalized understanding

## 2020-10-09 NOTE — Telephone Encounter (Signed)
-----   Message from Larey Dresser, MD sent at 10/07/2020 10:35 PM EDT ----- Creatinine still high, decrease torsemide to 60 mg daily alternating with 40 mg daily.  BMET 1 week.

## 2020-10-09 NOTE — Addendum Note (Signed)
Addended by: Valeda Malm on: 10/09/2020 12:50 PM   Modules accepted: Orders

## 2020-10-10 ENCOUNTER — Encounter (HOSPITAL_COMMUNITY): Payer: Self-pay | Admitting: Cardiology

## 2020-10-10 ENCOUNTER — Encounter (HOSPITAL_COMMUNITY)
Admission: RE | Disposition: A | Discharge status: Home / Self Care | Payer: Self-pay | Source: Home / Self Care | Attending: Cardiology

## 2020-10-10 ENCOUNTER — Ambulatory Visit (HOSPITAL_COMMUNITY): Payer: Medicare Other | Admitting: Anesthesiology

## 2020-10-10 ENCOUNTER — Other Ambulatory Visit: Payer: Self-pay

## 2020-10-10 ENCOUNTER — Ambulatory Visit (HOSPITAL_COMMUNITY)
Admission: RE | Admit: 2020-10-10 | Discharge: 2020-10-10 | Disposition: A | Discharge status: Home / Self Care | Payer: Medicare Other | Attending: Cardiology | Admitting: Cardiology

## 2020-10-10 ENCOUNTER — Ambulatory Visit (HOSPITAL_BASED_OUTPATIENT_CLINIC_OR_DEPARTMENT_OTHER): Payer: Medicare Other

## 2020-10-10 DIAGNOSIS — I130 Hypertensive heart and chronic kidney disease with heart failure and stage 1 through stage 4 chronic kidney disease, or unspecified chronic kidney disease: Secondary | ICD-10-CM | POA: Diagnosis not present

## 2020-10-10 DIAGNOSIS — I2510 Atherosclerotic heart disease of native coronary artery without angina pectoris: Secondary | ICD-10-CM | POA: Insufficient documentation

## 2020-10-10 DIAGNOSIS — I340 Nonrheumatic mitral (valve) insufficiency: Secondary | ICD-10-CM

## 2020-10-10 DIAGNOSIS — N183 Chronic kidney disease, stage 3 unspecified: Secondary | ICD-10-CM | POA: Diagnosis not present

## 2020-10-10 DIAGNOSIS — Z95810 Presence of automatic (implantable) cardiac defibrillator: Secondary | ICD-10-CM | POA: Diagnosis not present

## 2020-10-10 DIAGNOSIS — Z79899 Other long term (current) drug therapy: Secondary | ICD-10-CM | POA: Insufficient documentation

## 2020-10-10 DIAGNOSIS — I4891 Unspecified atrial fibrillation: Secondary | ICD-10-CM | POA: Diagnosis not present

## 2020-10-10 DIAGNOSIS — E1122 Type 2 diabetes mellitus with diabetic chronic kidney disease: Secondary | ICD-10-CM | POA: Insufficient documentation

## 2020-10-10 DIAGNOSIS — Z7982 Long term (current) use of aspirin: Secondary | ICD-10-CM | POA: Diagnosis not present

## 2020-10-10 DIAGNOSIS — I428 Other cardiomyopathies: Secondary | ICD-10-CM | POA: Insufficient documentation

## 2020-10-10 DIAGNOSIS — I480 Paroxysmal atrial fibrillation: Secondary | ICD-10-CM

## 2020-10-10 DIAGNOSIS — I5022 Chronic systolic (congestive) heart failure: Secondary | ICD-10-CM | POA: Diagnosis not present

## 2020-10-10 DIAGNOSIS — I083 Combined rheumatic disorders of mitral, aortic and tricuspid valves: Secondary | ICD-10-CM | POA: Insufficient documentation

## 2020-10-10 DIAGNOSIS — F17210 Nicotine dependence, cigarettes, uncomplicated: Secondary | ICD-10-CM | POA: Diagnosis not present

## 2020-10-10 DIAGNOSIS — Z7901 Long term (current) use of anticoagulants: Secondary | ICD-10-CM | POA: Diagnosis not present

## 2020-10-10 DIAGNOSIS — Z7984 Long term (current) use of oral hypoglycemic drugs: Secondary | ICD-10-CM | POA: Diagnosis not present

## 2020-10-10 HISTORY — PX: TEE WITHOUT CARDIOVERSION: SHX5443

## 2020-10-10 HISTORY — PX: CARDIOVERSION: SHX1299

## 2020-10-10 LAB — GLUCOSE, CAPILLARY: Glucose-Capillary: 82 mg/dL (ref 70–99)

## 2020-10-10 SURGERY — CARDIOVERSION
Anesthesia: General

## 2020-10-10 MED ORDER — PROPOFOL 500 MG/50ML IV EMUL
INTRAVENOUS | Status: DC | PRN
  Administered 2020-10-10: 100 ug/kg/min via INTRAVENOUS

## 2020-10-10 MED ORDER — SODIUM CHLORIDE 0.9 % IV SOLN: INTRAVENOUS | Status: DC

## 2020-10-10 MED ORDER — BUTAMBEN-TETRACAINE-BENZOCAINE 2-2-14 % EX AERO
INHALATION_SPRAY | CUTANEOUS | Status: DC | PRN
  Administered 2020-10-10: 2 via TOPICAL

## 2020-10-10 MED ORDER — PHENYLEPHRINE 40 MCG/ML (10ML) SYRINGE FOR IV PUSH (FOR BLOOD PRESSURE SUPPORT)
PREFILLED_SYRINGE | INTRAVENOUS | Status: DC | PRN
  Administered 2020-10-10 (×2): 120 ug via INTRAVENOUS

## 2020-10-10 MED ORDER — NALOXONE HCL 0.4 MG/ML IJ SOLN
INTRAMUSCULAR | Status: DC | PRN
  Administered 2020-10-10: .08 mg via INTRAVENOUS
  Administered 2020-10-10: .16 mg via INTRAVENOUS
  Administered 2020-10-10 (×2): .08 mg via INTRAVENOUS

## 2020-10-10 MED ORDER — EPHEDRINE SULFATE-NACL 50-0.9 MG/10ML-% IV SOSY
PREFILLED_SYRINGE | INTRAVENOUS | Status: DC | PRN
  Administered 2020-10-10: 15 mg via INTRAVENOUS

## 2020-10-10 MED ORDER — FENTANYL CITRATE (PF) 100 MCG/2ML IJ SOLN
INTRAMUSCULAR | Status: DC | PRN
  Administered 2020-10-10: 25 ug via INTRAVENOUS

## 2020-10-10 MED ORDER — LIDOCAINE 2% (20 MG/ML) 5 ML SYRINGE
INTRAMUSCULAR | Status: DC | PRN
  Administered 2020-10-10: 80 mg via INTRAVENOUS

## 2020-10-10 NOTE — Addendum Note (Signed)
Addendum  created 10/10/20 0938 by Imagene Riches, CRNA   Charge Capture section accepted

## 2020-10-10 NOTE — Progress Notes (Signed)
  Echocardiogram Echocardiogram Transesophageal has been performed.  Anthony Rubio 10/10/2020, 8:42 AM

## 2020-10-10 NOTE — CV Procedure (Signed)
Procedure: TEE  Sedation: Per anesthesiology  Findings: Please see echo section for full report.   Impression: Severely dilated LV with normal wall thickness.  EF < 20%, diffuse hypokinesis.  I did not see LV thrombus.  Moderately dilated RV with moderately decreased systolic function.  Mild right atrial enlargment.  Mild left atrial enlargement, no LA appendage thrombus.  No PFO/ASD by color doppler.  ICD in right heart.  Moderate TR, peak RV-RA gradient 38 mmHg.  Mild MR.  Functionally bicuspid aortic valve with fused left and right coronary cusps.  Mild AI, there does not appear to be significant aortic stenosis (mean gradient 3 mmHg).  Normal caliber thoracic aorta with mild plaque.   Impression: May proceed to DCCV.  Anthony Rubio 10/10/2020 8:24 AM

## 2020-10-10 NOTE — Anesthesia Postprocedure Evaluation (Signed)
Anesthesia Post Note  Patient: Anthony Rubio.  Procedure(s) Performed: CARDIOVERSION (N/A ) TRANSESOPHAGEAL ECHOCARDIOGRAM (TEE) (N/A )     Patient location during evaluation: Endoscopy Anesthesia Type: General Level of consciousness: awake and alert Pain management: pain level controlled Vital Signs Assessment: post-procedure vital signs reviewed and stable Respiratory status: spontaneous breathing, nonlabored ventilation, respiratory function stable and patient connected to nasal cannula oxygen Cardiovascular status: blood pressure returned to baseline and stable Postop Assessment: no apparent nausea or vomiting Anesthetic complications: no   No complications documented.  Last Vitals:  Vitals:   10/10/20 0849 10/10/20 0900  BP: 107/90 105/84  Pulse:  69  Resp:  (!) 21  Temp:    SpO2:  96%    Last Pain:  Vitals:   10/10/20 0849  TempSrc:   PainSc: 0-No pain                 Catalina Gravel

## 2020-10-10 NOTE — Anesthesia Procedure Notes (Signed)
Procedure Name: General with mask airway Date/Time: 10/10/2020 8:02 AM Performed by: Imagene Riches, CRNA Pre-anesthesia Checklist: Patient identified, Emergency Drugs available, Suction available, Patient being monitored and Timeout performed Patient Re-evaluated:Patient Re-evaluated prior to induction Oxygen Delivery Method: Nasal cannula

## 2020-10-10 NOTE — Interval H&P Note (Signed)
History and Physical Interval Note:  10/10/2020 8:03 AM  Anthony Rubio.  has presented today for surgery, with the diagnosis of AFIB.  The various methods of treatment have been discussed with the patient and family. After consideration of risks, benefits and other options for treatment, the patient has consented to  Procedure(s): CARDIOVERSION (N/A) TRANSESOPHAGEAL ECHOCARDIOGRAM (TEE) (N/A) as a surgical intervention.  The patient's history has been reviewed, patient examined, no change in status, stable for surgery.  I have reviewed the patient's chart and labs.  Questions were answered to the patient's satisfaction.     [name]

## 2020-10-10 NOTE — Discharge Instructions (Signed)
Transesophageal Echocardiogram Transesophageal echocardiogram (TEE) is a test that uses sound waves to take pictures of your heart. TEE is done by passing a small probe attached to a flexible tube down the part of the body that moves food from your mouth to your stomach (esophagus). The pictures give clear images of your heart. This can help your doctor see if there are problems with your heart. Tell a doctor about:  Any allergies you have.  All medicines you are taking. This includes vitamins, herbs, eye drops, creams, and over-the-counter medicines.  Any problems you or family members have had with anesthetic medicines.  Any blood disorders you have.  Any surgeries you have had.  Any medical conditions you have.  Any swallowing problems.  Whether you have or have had a blockage in the part of the body that moves food from your mouth to your stomach.  Whether you are pregnant or may be pregnant. What are the risks? In general, this is a safe procedure. But, problems may occur, such as:  Damage to nearby structures or organs.  A tear in the part of the body that moves food from your mouth to your stomach.  Irregular heartbeat.  Hoarse voice or trouble swallowing.  Bleeding. What happens before the procedure? Medicines  Ask your doctor about changing or stopping: ? Your normal medicines. ? Vitamins, herbs, and supplements. ? Over-the-counter medicines.  Do not take aspirin or ibuprofen unless you are told to. General instructions  Follow instructions from your doctor about what you cannot eat or drink.  You will take out any dentures or dental retainers.  Plan to have a responsible adult take you home from the hospital or clinic.  Plan to have a responsible adult care for you for the time you are told after you leave the hospital or clinic. This is important. What happens during the procedure?  An IV will be put into one of your veins.  You may be given: ? A  sedative. This medicine helps you relax. ? A medicine to numb the back of your throat. This may be sprayed or gargled.  Your blood pressure, heart rate, and breathing will be watched.  You may be asked to lie on your left side.  A bite block will be placed in your mouth. This keeps you from biting the tube.  The tip of the probe will be placed into the back of your mouth.  You will be asked to swallow.  Your doctor will take pictures of your heart.  The probe and bite block will be taken out after the test is done. The procedure may vary among doctors and hospitals.   What can I expect after the procedure?  You will be monitored until you leave the hospital or clinic. This includes checking your blood pressure, heart rate, breathing rate, and blood oxygen level.  Your throat may feel sore and numb. This will get better over time. You will not be allowed to eat or drink until the numbness has gone away.  It is common to have a sore throat for a day or two.  It is up to you to get the results of your procedure. Ask how to get your results when they are ready. Follow these instructions at home:  If you were given a sedative during your procedure, do not drive or use machines until your doctor says that it is safe.  Return to your normal activities when your doctor says that it is safe.    Keep all follow-up visits. Summary  TEE is a test that uses sound waves to take pictures of your heart.  You will be given a medicine to help you relax.  Do not drive or use machines until your doctor says that it is safe. This information is not intended to replace advice given to you by your health care provider. Make sure you discuss any questions you have with your health care provider. Document Revised: 02/13/2020 Document Reviewed: 02/13/2020 Elsevier Patient Education  2021 [place].   Hospital doctor cardioversion is the delivery of a jolt of electricity to  restore a normal rhythm to the heart. A rhythm that is too fast or is not regular keeps the heart from pumping well. In this procedure, sticky patches or metal paddles are placed on the chest to deliver electricity to the heart from a device. This procedure may be done in an emergency if:  There is low or no blood pressure as a result of the heart rhythm.  Normal rhythm must be restored as fast as possible to protect the brain and heart from further damage.  It may save a life. This may also be a scheduled procedure for irregular or fast heart rhythms that are not immediately life-threatening. Tell a health care provider about:  Any allergies you have.  All medicines you are taking, including vitamins, herbs, eye drops, creams, and over-the-counter medicines.  Any problems you or family members have had with anesthetic medicines.  Any blood disorders you have.  Any surgeries you have had.  Any medical conditions you have.  Whether you are pregnant or may be pregnant. What are the risks? Generally, this is a safe procedure. However, problems may occur, including:  Allergic reactions to medicines.  A blood clot that breaks free and travels to other parts of your body.  The possible return of an abnormal heart rhythm within hours or days after the procedure.  Your heart stopping (cardiac arrest). This is rare. What happens before the procedure? Medicines  Your health care provider may have you start taking: ? Blood-thinning medicines (anticoagulants) so your blood does not clot as easily. ? Medicines to help stabilize your heart rate and rhythm.  Ask your health care provider about: ? Changing or stopping your regular medicines. This is especially important if you are taking diabetes medicines or blood thinners. ? Taking medicines such as aspirin and ibuprofen. These medicines can thin your blood. Do not take these medicines unless your health care provider tells you to take  them. ? Taking over-the-counter medicines, vitamins, herbs, and supplements. General instructions  Follow instructions from your health care provider about eating or drinking restrictions.  Plan to have someone take you home from the hospital or clinic.  If you will be going home right after the procedure, plan to have someone with you for 24 hours.  Ask your health care provider what steps will be taken to help prevent infection. These may include washing your skin with a germ-killing soap. What happens during the procedure?  An IV will be inserted into one of your veins.  Sticky patches (electrodes) or metal paddles may be placed on your chest.  You will be given a medicine to help you relax (sedative).  An electrical shock will be delivered. The procedure may vary among health care providers and hospitals.   What can I expect after the procedure?  Your blood pressure, heart rate, breathing rate, and blood oxygen level will be monitored until  you leave the hospital or clinic.  Your heart rhythm will be watched to make sure it does not change.  You may have some redness on the skin where the shocks were given. Follow these instructions at home:  Do not drive for 24 hours if you were given a sedative during your procedure.  Take over-the-counter and prescription medicines only as told by your health care provider.  Ask your health care provider how to check your pulse. Check it often.  Rest for 48 hours after the procedure or as told by your health care provider.  Avoid or limit your caffeine use as told by your health care provider.  Keep all follow-up visits as told by your health care provider. This is important. Contact a health care provider if:  You feel like your heart is beating too quickly or your pulse is not regular.  You have a serious muscle cramp that does not go away. Get help right away if:  You have discomfort in your chest.  You are dizzy or you  feel faint.  You have trouble breathing or you are short of breath.  Your speech is slurred.  You have trouble moving an arm or leg on one side of your body.  Your fingers or toes turn cold or blue. Summary  Electrical cardioversion is the delivery of a jolt of electricity to restore a normal rhythm to the heart.  This procedure may be done right away in an emergency or may be a scheduled procedure if the condition is not an emergency.  Generally, this is a safe procedure.  After the procedure, check your pulse often as told by your health care provider. This information is not intended to replace advice given to you by your health care provider. Make sure you discuss any questions you have with your health care provider. Document Revised: 01/23/2019 Document Reviewed: 01/23/2019 Elsevier Patient Education  [place].

## 2020-10-10 NOTE — Procedures (Signed)
Electrical Cardioversion Procedure Note Jaedyn Demello DJ:[id number] Apr 07, 1951  Procedure: Electrical Cardioversion Indications:  Atrial Fibrillation  Procedure Details Consent: Risks of procedure as well as the alternatives and risks of each were explained to the (patient/caregiver).  Consent for procedure obtained. Time Out: Verified patient identification, verified procedure, site/side was marked, verified correct patient position, special equipment/implants available, medications/allergies/relevent history reviewed, required imaging and test results available.  Performed  Patient placed on cardiac monitor, pulse oximetry, supplemental oxygen as necessary.  Sedation given: Propofol per anesthesiology Pacer pads placed anterior and posterior chest.  Cardioverted 1 time(s).  Cardioverted at [place].  Evaluation Findings: Post procedure EKG shows: NSR Complications: None Patient did tolerate procedure well.   [name] 10/10/2020, 8:25 AM

## 2020-10-10 NOTE — Transfer of Care (Signed)
Immediate Anesthesia Transfer of Care Note  Patient: Anthony Rubio.  Procedure(s) Performed: CARDIOVERSION (N/A ) TRANSESOPHAGEAL ECHOCARDIOGRAM (TEE) (N/A )  Patient Location: Endoscopy Unit  Anesthesia Type:MAC  Level of Consciousness: drowsy  Airway & Oxygen Therapy: Patient Spontanous Breathing and Patient connected to face mask oxygen  Post-op Assessment: Report given to RN and Post -op Vital signs reviewed and stable  Post vital signs: Reviewed and stable  Last Vitals:  Vitals Value Taken Time  BP    Temp    Pulse    Resp    SpO2      Last Pain:  Vitals:   10/10/20 0718  TempSrc: Tympanic         Complications: No complications documented.

## 2020-10-13 ENCOUNTER — Encounter (HOSPITAL_COMMUNITY): Payer: Self-pay | Admitting: Cardiology

## 2020-10-16 ENCOUNTER — Other Ambulatory Visit (HOSPITAL_COMMUNITY): Payer: Self-pay | Admitting: Cardiology

## 2020-10-16 ENCOUNTER — Other Ambulatory Visit: Payer: Self-pay | Admitting: Internal Medicine

## 2020-10-17 ENCOUNTER — Other Ambulatory Visit: Payer: Self-pay

## 2020-10-17 ENCOUNTER — Encounter (HOSPITAL_COMMUNITY): Payer: Self-pay

## 2020-10-17 ENCOUNTER — Ambulatory Visit (HOSPITAL_COMMUNITY)
Admission: RE | Admit: 2020-10-17 | Discharge: 2020-10-17 | Disposition: A | Discharge status: Home / Self Care | Payer: Medicare Other | Source: Ambulatory Visit | Attending: Cardiology | Admitting: Cardiology

## 2020-10-17 VITALS — BP 108/60 | HR 70 | Wt 215.0 lb

## 2020-10-17 DIAGNOSIS — I5022 Chronic systolic (congestive) heart failure: Secondary | ICD-10-CM | POA: Insufficient documentation

## 2020-10-17 DIAGNOSIS — Z7901 Long term (current) use of anticoagulants: Secondary | ICD-10-CM | POA: Diagnosis not present

## 2020-10-17 DIAGNOSIS — N183 Chronic kidney disease, stage 3 unspecified: Secondary | ICD-10-CM | POA: Diagnosis not present

## 2020-10-17 DIAGNOSIS — Z7984 Long term (current) use of oral hypoglycemic drugs: Secondary | ICD-10-CM | POA: Diagnosis not present

## 2020-10-17 DIAGNOSIS — Z7982 Long term (current) use of aspirin: Secondary | ICD-10-CM | POA: Diagnosis not present

## 2020-10-17 DIAGNOSIS — I480 Paroxysmal atrial fibrillation: Secondary | ICD-10-CM | POA: Insufficient documentation

## 2020-10-17 DIAGNOSIS — Z79899 Other long term (current) drug therapy: Secondary | ICD-10-CM | POA: Insufficient documentation

## 2020-10-17 DIAGNOSIS — I2510 Atherosclerotic heart disease of native coronary artery without angina pectoris: Secondary | ICD-10-CM | POA: Insufficient documentation

## 2020-10-17 DIAGNOSIS — E1122 Type 2 diabetes mellitus with diabetic chronic kidney disease: Secondary | ICD-10-CM | POA: Diagnosis not present

## 2020-10-17 DIAGNOSIS — Z95810 Presence of automatic (implantable) cardiac defibrillator: Secondary | ICD-10-CM | POA: Diagnosis not present

## 2020-10-17 DIAGNOSIS — Z881 Allergy status to other antibiotic agents status: Secondary | ICD-10-CM | POA: Insufficient documentation

## 2020-10-17 DIAGNOSIS — F17210 Nicotine dependence, cigarettes, uncomplicated: Secondary | ICD-10-CM | POA: Diagnosis not present

## 2020-10-17 DIAGNOSIS — R634 Abnormal weight loss: Secondary | ICD-10-CM | POA: Insufficient documentation

## 2020-10-17 DIAGNOSIS — Z6826 Body mass index (BMI) 26.0-26.9, adult: Secondary | ICD-10-CM | POA: Diagnosis not present

## 2020-10-17 DIAGNOSIS — E1140 Type 2 diabetes mellitus with diabetic neuropathy, unspecified: Secondary | ICD-10-CM | POA: Diagnosis not present

## 2020-10-17 DIAGNOSIS — I428 Other cardiomyopathies: Secondary | ICD-10-CM | POA: Insufficient documentation

## 2020-10-17 DIAGNOSIS — I130 Hypertensive heart and chronic kidney disease with heart failure and stage 1 through stage 4 chronic kidney disease, or unspecified chronic kidney disease: Secondary | ICD-10-CM | POA: Insufficient documentation

## 2020-10-17 LAB — BASIC METABOLIC PANEL
Anion gap: 7 (ref 5–15)
BUN: 29 mg/dL — ABNORMAL HIGH (ref 8–23)
CO2: 24 mmol/L (ref 22–32)
Calcium: 8.7 mg/dL — ABNORMAL LOW (ref 8.9–10.3)
Chloride: 105 mmol/L (ref 98–111)
Creatinine, Ser: 1.93 mg/dL — ABNORMAL HIGH (ref 0.61–1.24)
GFR, Estimated: 37 mL/min — ABNORMAL LOW (ref >60)
Glucose, Bld: 140 mg/dL — ABNORMAL HIGH (ref 70–99)
Potassium: 4.4 mmol/L (ref 3.5–5.1)
Sodium: 136 mmol/L (ref 135–145)

## 2020-10-17 LAB — CBC
HCT: 50.5 % (ref 39.0–52.0)
Hemoglobin: 16.2 g/dL (ref 13.0–17.0)
MCH: 29.4 pg (ref 26.0–34.0)
MCHC: 32.1 g/dL (ref 30.0–36.0)
MCV: 91.7 fL (ref 80.0–100.0)
Platelets: 109 10*3/uL — ABNORMAL LOW (ref 150–400)
RBC: 5.51 MIL/uL (ref 4.22–5.81)
RDW: 18.2 % — ABNORMAL HIGH (ref 11.5–15.5)
WBC: 4.9 10*3/uL (ref 4.0–10.5)
nRBC: 0 % (ref 0.0–0.2)

## 2020-10-17 MED ORDER — SPIRONOLACTONE 25 MG PO TABS: ORAL_TABLET | ORAL | 3 refills | Status: DC

## 2020-10-17 NOTE — Addendum Note (Signed)
Addended by: Carter Kitten D on: 10/17/2020 10:14 AM   Modules accepted: Orders

## 2020-10-17 NOTE — Progress Notes (Signed)
Patient ID: Anthony Rubio., male   DOB: 1950-08-14, 70 y.o.   MRN: [id number] PCP: Dr. Jerline Pain Cardiology: Dr. Aundra Dubin  Advanced Heart Failure Clinic Note    Reason for Visit: F/u for Chronic Systolic Heart Failure and PAF s/p DCCV     70 y.o. with history of CAD, mixed ischemic/nonischemic cardiomyopathy, and diabetes who presents for followup. He initially was hospitalized in 5/11 with acute decompensated CHF after several days of shortness of breath, orthopnea, and PND. He was diuresed and left/right heart catheterization was done. Left heart cath showed large, ectatic coronaries with slow flow and moderate to severe diffuse distal vessel disease (consistent with diabetes) without good interventional options. EF was 15-20% by echo. He was put on milrinone and further diuresed for several more days. He was titrated off milrinone and discharged on an oral medication regimen. He later developed atrial flutter requiring cardioversion. He remains in NSR.  Patient had Medtronic ICD placed in 11/11.  Repeat echo in 10/12 showed EF 25%.  Most recent echo in 1/19 showed EF 25% with severe LV dilation. He was noted to be in atrial fibrillation again in 2/19.  He had TEE-guided DCCV in 2/19 back to NSR. He has not been able tolerate even 1/2 tab tid of Bidil due to lightheadedness.   He went into atrial fibrillation in 2/22 with increased RV pacing and worsening CHF.  He had DCCV back to NSR in 09/12/20. However, it appears that a few days after DCCV, he went back into atrial fibrillation.    Had return f/u w/ Dr. Aundra Dubin 3/21 and was back in Afib and felt poorly. Was also fluid overloaded on Optivol. Lasix was increased to 80 mg daily and amiodarone added 200 mg bid x 2 wks then 200 mg daily. Coumadin also discontinued (difficulties regulating INR). He was place on Eliquis and referred for TEE/DCCV. TEE showed LVEF < 20%, RV moderately reduced, mild bi atrial enlargement, no LAA or LV thrombus. Has successful  DCCV back to NSR.   Of note, he had repeat labs on 4/6 after lasix was increased and SCr had bumped from 2.6>>3.1>>3.2. He was instructed to decreased torsemide, alternating between 60 daily and 40 daily w/ recs to repeat BMP again in 7 days.   He presents back to clinic today for f/u. Here w/ wife. In wheel chair. He remains in NSR on EKG. No Afib recurrence since his last DCCV, based on device interrogation. His fluid index/ optivol is improved. Index now below threshold. He complains of continued wt loss/ poor appetite. He denies any significant dyspnea but he is not very active due to severe diabetic neuropathy, uses a wheelchair when he leaves the house, he uses a walker when walking around the house.   Still smokes a pack every 3-4 days.    Medtronic device reviewed: Optivol with fluid index < threshold, no further afib since DCCV on 10/10/20.  ECG (personally reviewed): Atrial-paced rhythm with prolonged AV conduction 70 bpm    Labs (9/11): LDL 61, HDL 26, K 4.8, creatinine 1.6  Labs (10/11): K 4.3, creatinine 1.3, BNP 165  Labs (11/11): K 4.9, creatinine 1.6  Labs (12/11): K 4.8, creatinine 1.7  Labs (2/12): LDL 110, HDL 28  Labs (3/12): K 4.1, creatinine 1.7 Labs (6/12): K 4.4, creatinine 1.4, BNP 125 Labs (10/12): K 4.8, creatinine 1.4 Labs (12/12): K 4.4, creatinine 1.5, BNP 20 Labs (3/13): K 5, creatinine 1.5, LDL 43, HDL 31 Labs (8/13): K 4.2, creatinine 1.3,  BNP 158 Labs (05/03/14): K 4.6 Creatinine 1.4  Labs (9/16): K 4.4, creatinine 1.5 Labs (5/18): K 4.7, creatinine 1.63, pro-BNP 5249, hgb 14.4 Labs (12/18): K 4.5, creatinine 1.99, LDL 127, HDL 28, hgb 16.1 Labs (2/19): K 4.7, creatinine 2.46, LDL 78, HDL 26 Labs (11/20): K 4.9, creatinine 1.95 Labs (3/22): K 4.7, creatinine 2.9, BNP 2121  Allergies:  1) ! Erythromycin   Past Medical History:  1. CAD: LHC (5/11) with 50-60% mid LAD, diffuse moderate distal LAD up to 60-70%, small OM2 subtotally occluded, moderate to  severe diffuse distal PLOM disease, severe diffuse distal RCA, PDA, and PLV disease. Coronaries were ectatic with slow flow. Patient was started on Plavix given concern for thrombus formation in the ectatic coronaries. He stopped Plavix after starting coumadin with atrial flutter.  2. Chronic systolic CHF: Likely mixed ischemic/nonischemic. Echo (5/11) with EF 15-20%, severe global hypokinesis, mild MR, moderate diastolic dysfunction, mildly decreased RV systolic function. RHC (5/11) with mean RA 14 mmHg, PA 38/26, mean PCWP 25 mmHg, CI 1.8. Repeat echo (8/11) with EF 99-83%, severe diastolic dysfunction, mild MR, RV normal size and systolic function. Per official read, cannot rule out apical clot but it does appear to be only trabeculations. TEE (9/11): EF 15% with global hypokinesis, mild to moderate MR, no LV thrombus and no LAA thrombus.  Echo (10/12): EF 25%.  Echo (1/17) with EF 20-25%, severe LV dilation, mild aortic stenosis with AVA 1.5 cm^2, moderate MR, moderately dilated RV with moderately decreased RV systolic function, PASP 50 mmHg. Medtronic ICD.  - Echo (1/19): EF 25%, severe LV dilation, mild RV dilation with severely decreased systolic function, mild-moderate MR.  - TEE (2/19): EF 25%, moderately dilated LV, mildly dilated RV with moderately decreased systolic function, moderate LAE/RAE, moderate TR, peak RV-RA gradient 56 mmHg, functionally bicuspid aortic valve without aortic stenosis.  3. Type II diabetes 4. HTN  5. Smoker  6. Severe diabetic neuropathy with chronic gait dysfunction  7. History of left foot ulcer with poor healing  8. Atrial flutter s/p DCCV 9/11  9. Aortic stenosis: Mild by echo in 1/17. 10. Atrial fibrillation: Paroxysmal.  - DCCV to NSR in 2/19.  11. CKD: Stage 3.  Suspect diabetic nephropathy.  12. Functionally bicuspid aortic valve.   Family History:  Family History of Colon CA 1st degree relative <60 9other relative)  Heart disease (grandparent)  Stroke  (other relative)  dm only in secondary relatives   Social History:  Pt lives in [place]. 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    Current Outpatient Medications  Medication Sig Dispense Refill  . amiodarone (PACERONE) 200 MG tablet Take 2 tablets (400 mg total) by mouth 2 (two) times daily for 7 days, THEN 1 tablet (200 mg total) 2 (two) times daily for 7 days, THEN 1 tablet (200 mg total) daily. 60 tablet 3  . apixaban (ELIQUIS) 5 MG TABS tablet Take 1 tablet (5 mg total) by mouth 2 (two) times daily. 60 tablet 6  . atorvastatin (LIPITOR) 80 MG tablet Take 1 tablet (80 mg total) by mouth daily. 90 tablet 1  . carvedilol (COREG) 6.25 MG tablet Take 2 tablets (12.5 mg total) by mouth 2 (two) times daily with a meal. 360 tablet 1  . ezetimibe (ZETIA) 10 MG tablet Take 1 tablet (10 mg total) by mouth  daily. 90 tablet 3  . glipiZIDE (GLUCOTROL XL) 10 MG 24 hr tablet Take 1-2 tablets (10-20 mg total) by mouth daily with breakfast. Take 2 tablets if blood sugar > 200. 180 tablet 3  . isosorbide mononitrate (IMDUR) 30 MG 24 hr tablet Take 1/2 (one-half) tablet by mouth once daily 45 tablet 0  . Lancets (ACCU-CHEK SOFT TOUCH) lancets Use to test blood sugar once a day E11.9 100 each 3  . torsemide (DEMADEX) 20 MG tablet 1 tablet by mouth every morning and 2 tablets by mouth every evening    . spironolactone (ALDACTONE) 25 MG tablet TAKE 1 TABLET BY MOUTH ONCE DAILY 90 tablet 3  . torsemide (DEMADEX) 20 MG tablet Take $RemoveBef'60mg'YVuZSbGzOa$  (3 tabs) daily every other day alternating with $RemoveBeforeDE'40mg'iASEucmrXOCZUes$  (2 tabs) daily every other day 90 tablet 3   No current facility-administered medications for this encounter.    BP 108/60   Pulse 70   Wt 97.5 kg   SpO2 96%   BMI 26.87 kg/m    PHYSICAL EXAM: General:  Mildly fatigued appearing male, in wheelchair. No respiratory  difficulty HEENT: normal Neck: supple. no JVD. Carotids 2+ bilat; no bruits. No lymphadenopathy or thyromegaly appreciated. Cor: PMI nondisplaced. Regular rate & rhythm. No rubs, gallops or murmurs. Lungs: clear Abdomen: soft, nontender, nondistended. No hepatosplenomegaly. No bruits or masses. Good bowel sounds. Extremities: no cyanosis, clubbing, rash, edema Neuro: alert & oriented x 3, cranial nerves grossly intact. moves all 4 extremities w/o difficulty. Affect pleasant.  Assessment/Plan:  1. Atrial fibrillation:  Prior atrial flutter in 2011.  He was noted to be in atrial fibrillation in 2/19 and underwent TEE-guided DCCV to NSR.  DCCV again in 3/22 to NSR. He was back in atrial fibrillation shortly after this.  He does not tolerate AF well with increased RV pacing percentage and worsening HF. He underwent another DCCV 10/10/20, suscessful. In NSR today, per device interrogation there has been no recurrent Afib since 4/7 - Continue Eliquis 5 mg bid   - I do not think that he will stay in NSR long-term after another cardioversion given cardiomyopathy and failure to hold NSR after his 3/22 cardioversion.  Renal dysfunction (and history of poor medication compliance) makes Tikosyn a poor option, ablation may not be an option either given his severely dilated LA.  Therefore, will continue amiodarone. He has completed load, now on maintenance dose of 200 mg daily.  Will need usual amiodarone screening w/ biannual HFTs and TFTs + annual eye exams.   - He has appt with EP, but as above, doubt AF ablation will be a viable option.  2. CKD stage 3: Suspect diabetic nephropathy and neuropathy.  - He has been referred to nephrology for evaluation - Check BMP today   3. Chronic systolic heart failure: Mixed ischemic/nonischemic cardiomyopathy. Has Medtronic ICD.  2D echo in 1/19 with EF 25%, severely dilated LV, severely decreased RV systolic function. He has significant distal vessel CAD on prior cath as  well as a history of poorly controlled diabetes. He has NYHA class III symptoms (worse in AF) though not very active due to severe peripheral neuropathy. He has had a hard time tolerating medications due to orthostatic symptoms and also with baseline CKD stage 3.  Most recent creatinine up to 3.2. Had recent TEE 4/22 showing LVEF <20%, RV moderately reduced, mod TR, mild MR.   - With chronic systolic heart failure, conduction disease w/ difficult to control Afib, worsening renal function, med intolerance due  to orthostatic hypotension, lifestyle limiting polyneuropathy and progressive unintentional wt loss,  I worry about cardiac amyloidosis and multiple myeloma. I discussed PYP scan but he would pefer to discuss further w/ Dr. Aundra Dubin at his next visit.  - Will obtain Multiple Myeloma panel  - Volume status good today on exam and by Optivol. C/w torsemide, alternating between 60 mg and 40 mg the next - Continue Coreg 18.75 mg bid.   - He was unable to tolerate even low dose losartan and creatinine has been elevated. Will not retry ARB or ARNI at this time.  - Unable to tolerate Bidil 1/2 tab tid due to lightheadedness.  - Continue spironolactone 25 mg daily for now  - GFR too low for SGL2 inhibitor.  4. CAD: Diffuse moderate to severe distal vessel disease on last cath with ectatic coronaries with slow flow. No interventional target.  He denies ischemic chest pain.  - Continue ASA 81 daily.  - Continue atorvastatin, Zetia recently added to get LDL < 70.  - Needs repeat FLP in 6-8 weeks   5. Smoking: I strongly encouraged him to quit smoking.   He was advised to keep his f/u w/ Dr. Rayann Heman in 4 weeks. F/u w/ Dr. Aundra Dubin in 6 weeks.   Lyda Jester, PA-C  10/17/2020

## 2020-10-17 NOTE — Patient Instructions (Addendum)
  It was great to see you today! No medication changes are needed at this time.  Labs today We will only contact you if something comes back abnormal or we need to make some changes. Otherwise no news is good news!  Your physician recommends that you schedule a follow-up appointment in: 6-8 weeks with Dr Aundra Dubin  At the [facility], you and your health needs are our priority. As part of our continuing mission to provide you with exceptional heart care, we have created designated Provider Care Teams. These Care Teams include your primary Cardiologist (physician) and Advanced Practice Providers (APPs- Physician Assistants and Nurse Practitioners) who all work together to provide you with the care you need, when you need it.   You may see any of the following providers on your designated Care Team at your next follow up: Marland Kitchen Dr Glori Bickers . Dr [name] . Dr Vickki Muff . Darrick Grinder, NP . Lyda Jester, [place] . Audry Riles, PharmD   Please be sure to bring in all your medications bottles to every appointment.   If you have any questions or concerns before your next appointment please send [place] a message through [place] or call our office at [phone].    TO LEAVE A MESSAGE FOR THE NURSE SELECT OPTION 2, PLEASE LEAVE A MESSAGE INCLUDING: . YOUR NAME . DATE OF BIRTH . CALL BACK NUMBER . REASON FOR CALL**this is important as we prioritize the call backs  YOU WILL RECEIVE A CALL BACK THE SAME DAY AS LONG AS YOU CALL BEFORE 4:00 PM

## 2020-10-18 ENCOUNTER — Other Ambulatory Visit: Payer: Self-pay

## 2020-10-18 NOTE — Telephone Encounter (Signed)
This is a CHF pt, Dr. Mclean 

## 2020-10-21 LAB — MULTIPLE MYELOMA PANEL, SERUM
Albumin SerPl Elph-Mcnc: 3.4 g/dL (ref 2.9–4.4)
Albumin/Glob SerPl: 1.1 (ref 0.7–1.7)
Alpha 1: 0.3 g/dL (ref 0.0–0.4)
Alpha2 Glob SerPl Elph-Mcnc: 0.9 g/dL (ref 0.4–1.0)
B-Globulin SerPl Elph-Mcnc: 0.9 g/dL (ref 0.7–1.3)
Gamma Glob SerPl Elph-Mcnc: 1.3 g/dL (ref 0.4–1.8)
Globulin, Total: 3.3 g/dL (ref 2.2–3.9)
IgA: 278 mg/dL (ref 61–437)
IgG (Immunoglobin G), Serum: 1185 mg/dL (ref 603–1613)
IgM (Immunoglobulin M), Srm: 127 mg/dL (ref 15–143)
Total Protein ELP: 6.7 g/dL (ref 6.0–8.5)

## 2020-10-30 ENCOUNTER — Telehealth (HOSPITAL_COMMUNITY): Payer: Self-pay | Admitting: Pharmacy Technician

## 2020-10-30 ENCOUNTER — Other Ambulatory Visit (HOSPITAL_COMMUNITY): Payer: Self-pay

## 2020-10-30 NOTE — Telephone Encounter (Signed)
Spoke with the patient and his wife this afternoon. She is going to bring in the POI and then I will send it to [place] for re-determination.   Samples provided 3 boxes LOT [id number] EXP 07/24

## 2020-10-31 LAB — ECHO TEE
AV Mean grad: 3 mmHg
AV Peak grad: 4.7 mmHg
Ao pk vel: 1.08 m/s

## 2020-10-31 NOTE — Telephone Encounter (Signed)
Sent in POI via fax. ° °Will follow up. °

## 2020-11-05 ENCOUNTER — Ambulatory Visit (INDEPENDENT_AMBULATORY_CARE_PROVIDER_SITE_OTHER): Payer: Medicare Other

## 2020-11-05 DIAGNOSIS — Z95810 Presence of automatic (implantable) cardiac defibrillator: Secondary | ICD-10-CM

## 2020-11-05 DIAGNOSIS — I5022 Chronic systolic (congestive) heart failure: Secondary | ICD-10-CM | POA: Diagnosis not present

## 2020-11-06 NOTE — Progress Notes (Signed)
EPIC Encounter for ICM Monitoring  Patient Name: Anthony Rubio. is a 70 y.o. male Date: 11/06/2020 Primary Care Physican: Vivi Barrack, MD Primary Cardiologist:McLean Electrophysiologist: Allred 5/4/2022Weight:222lbs  Since 17-Oct-2020 Time in AT/AF <0.1 hr/day (<0.1%)    Spoke with patient and reports feeling well at this time.  Denies fluid symptoms.    Pt uses wheelchair outside the home and walker inside the home.  Optivol thoracic impedancesuggestingnormal fluid levels  Prescribed: Torsemide 20 mg Take '60mg'$  (3 tabs) daily every other day alternating with '40mg'$  (2 tabs) daily every other day. Spironolactone 25 mg take 1 tablet by mouth daily.   Labs: 09/30/2020 Creatinine 3.08, BUN 62, Potassium 4.4, Sodium 140 09/23/2020 Creatinine 2.59, BUN 60, Potassium 4.3, Sodium 140, GFR 26  09/09/2020 Creatinine 2.90, BUN 61, Potassium 4.7, Sodium 140, GFR 22  A complete set of results can be found in Results Review  Recommendations: Encouraged to call if experiencing any fluid symptoms.   Follow-up plan: ICM clinic phone appointment on6/13/2022. 91 day device clinic remote [id number].   EP/Cardiology Office Visits:12/12/2020 with Dr McLean.11/27/2020 with Dr Rayann Heman.  Copy of ICM check sent to Dr.Allred.   3 month ICM trend: 11/04/2020.    1 Year ICM trend:       [name], RN 11/06/2020 3:58 PM

## 2020-11-26 ENCOUNTER — Encounter: Payer: Self-pay | Admitting: Family Medicine

## 2020-11-26 ENCOUNTER — Ambulatory Visit (INDEPENDENT_AMBULATORY_CARE_PROVIDER_SITE_OTHER): Payer: Medicare Other | Admitting: Family Medicine

## 2020-11-26 ENCOUNTER — Other Ambulatory Visit: Payer: Self-pay

## 2020-11-26 VITALS — BP 93/55 | HR 57 | Temp 97.2°F | Ht 75.0 in | Wt 202.0 lb

## 2020-11-26 DIAGNOSIS — E1140 Type 2 diabetes mellitus with diabetic neuropathy, unspecified: Secondary | ICD-10-CM

## 2020-11-26 DIAGNOSIS — E1165 Type 2 diabetes mellitus with hyperglycemia: Secondary | ICD-10-CM | POA: Diagnosis not present

## 2020-11-26 DIAGNOSIS — I152 Hypertension secondary to endocrine disorders: Secondary | ICD-10-CM

## 2020-11-26 DIAGNOSIS — IMO0002 Reserved for concepts with insufficient information to code with codable children: Secondary | ICD-10-CM

## 2020-11-26 DIAGNOSIS — E1159 Type 2 diabetes mellitus with other circulatory complications: Secondary | ICD-10-CM | POA: Diagnosis not present

## 2020-11-26 LAB — POCT GLYCOSYLATED HEMOGLOBIN (HGB A1C): Hemoglobin A1C: 8.1 % — AB (ref 4.0–5.6)

## 2020-11-26 NOTE — Patient Instructions (Signed)
It was very nice to see you today!  Your A1c today is stable.  We will continue current treatment plan.  I will see back in 6 months.  Come back to see me sooner if needed.  Take care, Dr Jerline Pain  PLEASE NOTE:  If you had any lab tests please let us know if you have not heard back within a few days. You may see your results on mychart before we have a chance to review them but we will give you a call once they are reviewed by [place]. If we ordered any referrals today, please let us know if you have not heard from their office within the next week.   Please try these tips to maintain a healthy lifestyle:   Eat at least 3 REAL meals and 1-2 snacks per day.  Aim for no more than 5 hours between eating.  If you eat breakfast, please do so within one hour of getting up.    Each meal should contain half fruits/vegetables, one quarter protein, and one quarter carbs (no bigger than a computer mouse)   Cut down on sweet beverages. This includes juice, soda, and sweet tea.     Drink at least 1 glass of water with each meal and aim for at least 8 glasses per day   Exercise at least 150 minutes every week.

## 2020-11-26 NOTE — Assessment & Plan Note (Signed)
A1c stable but slightly above goal at 8.1.  He does not wish to change medications at this point.  Not a candidate for metformin due to CKD.  We will recheck in 3 to 6 months.  He will continue lifestyle modifications.

## 2020-11-26 NOTE — Assessment & Plan Note (Signed)
At goal on Coreg 12.5 mg twice daily, spironolactone 25 mg daily, and Imdur 15 mg daily.

## 2020-11-26 NOTE — Progress Notes (Signed)
   Anthony Rubio. is a 70 y.o. male who presents today for an office visit.  Assessment/Plan:  Chronic Problems Addressed Today: Hypertension associated with diabetes ([place]) At goal on Coreg 12.5 mg twice daily, spironolactone 25 mg daily, and Imdur 15 mg daily.  Type 2 diabetes, uncontrolled, with neuropathy (HCC) A1c stable but slightly above goal at 8.1.  He does not wish to change medications at this point.  Not a candidate for metformin due to CKD.  We will recheck in 3 to 6 months.  He will continue lifestyle modifications.    Subjective:  HPI:  See A/p.         Objective:  Physical Exam: BP (!) 93/55   Pulse (!) 57   Temp (!) 97.2 F (36.2 C) (Temporal)   Ht '6\' 3"'$  (1.905 m)   Wt 202 lb (91.6 kg)   SpO2 94%   BMI 25.25 kg/m   Gen: No acute distress, resting comfortably CV: Regular rate and rhythm with no murmurs appreciated Pulm: Normal work of breathing, clear to auscultation bilaterally with no crackles, wheezes, or rhonchi Neuro: Grossly normal, moves all extremities Psych: Normal affect and thought content      Anthony Thebeau M. Jerline Pain, MD 11/26/2020 10:01 AM

## 2020-11-27 ENCOUNTER — Ambulatory Visit (INDEPENDENT_AMBULATORY_CARE_PROVIDER_SITE_OTHER): Payer: Medicare Other | Admitting: Internal Medicine

## 2020-11-27 ENCOUNTER — Encounter: Payer: Self-pay | Admitting: Internal Medicine

## 2020-11-27 VITALS — BP 90/60 | HR 70 | Ht 75.0 in | Wt 201.2 lb

## 2020-11-27 DIAGNOSIS — I10 Essential (primary) hypertension: Secondary | ICD-10-CM

## 2020-11-27 DIAGNOSIS — I2510 Atherosclerotic heart disease of native coronary artery without angina pectoris: Secondary | ICD-10-CM | POA: Diagnosis not present

## 2020-11-27 DIAGNOSIS — I5022 Chronic systolic (congestive) heart failure: Secondary | ICD-10-CM

## 2020-11-27 DIAGNOSIS — I483 Typical atrial flutter: Secondary | ICD-10-CM

## 2020-11-27 DIAGNOSIS — I480 Paroxysmal atrial fibrillation: Secondary | ICD-10-CM

## 2020-11-27 MED ORDER — TORSEMIDE 20 MG PO TABS: 20.0000 mg | ORAL_TABLET | Freq: Every day | ORAL | 3 refills | Status: DC

## 2020-11-27 NOTE — Patient Instructions (Addendum)
Medication Instructions:  Reduce Torsemide to 20 mg daily Your physician recommends that you continue on your current medications as directed. Please refer to the Current Medication list given to you today.  Labwork: None ordered.  Testing/Procedures: None ordered.  Follow-Up: Your physician wants you to follow-up in: 6 months with Anthony Grayer, MD or one of the following Advanced Practice Providers on your designated Care Team:      [name] "[name]" Chalmers Cater, [place]   You will receive a reminder letter in the mail two months in advance. If you don't receive a letter, please call our office to schedule the follow-up appointment.  Remote monitoring is used to monitor your ICD from home. This monitoring reduces the number of office visits required to check your device to one time per year. It allows [place] to keep an eye on the functioning of your device to ensure it is working properly. You are scheduled for a device check from home on 12/16/20. You may send your transmission at any time that day. If you have a wireless device, the transmission will be sent automatically. After your physician reviews your transmission, you will receive a postcard with your next transmission date.  Any Other Special Instructions Will Be Listed Below (If Applicable).  If you need a refill on your cardiac medications before your next appointment, please call your pharmacy.

## 2020-11-27 NOTE — Progress Notes (Signed)
PCP: Vivi Barrack, MD Primary Cardiologist: Dr Aundra Dubin Primary EP: Dr Rayann Heman  Anthony Rubio. is a 70 y.o. male who presents today for routine electrophysiology followup.  He has struggled with volume overload in the setting of AF in April.  Since cardioversion, he is much improved.  Now feels that his BP is low and he is dizzy.  He has reduced his torsemide to '20mg'$  BID on his own. Today, he denies symptoms of palpitations, chest pain, shortness of breath,  lower extremity edema,  presyncope, syncope, or ICD shocks.  The patient is otherwise without complaint today.   Past Medical History:  Diagnosis Date  . AICD (automatic cardioverter/defibrillator) present   . Aortic stenosis    a. Mild by echo 04/2011.   . Arthritis   . Atrial flutter ([place])    s/p DCCV 9/11  . CAD (coronary artery disease)    a. LHC (5/11) with 50-60% mid LAD, diffuse mod distal LAD up to 60-70%, small OM2 subtotally occ, mod-severe diffuse distal PLOM, severe diffuse distal RCA, PDA, and PLV  disease. Started on Plavix given concern for thrombus formation in the ectatic cors with slow flow. b. Stopped Plavix after starting Coumadin for a-flutter.  . CHF (congestive heart failure) ([place])    a. Likely mixed ICM/NICM. Echo (5/11) with EF 15-20%, severe global HK, mild MR, mod d/d, mildly decreased RV fcn. RHC (5/11) with mean RA 14 mmHg, PA 38/26, mean PCWP 25 mmHg, CI 1.8.  b. TEE (9/11): EF 15% no LV thrombus, no LAA thrombus. c. s/p ICD 11/11. d. Last echo 04/2011: LV severely dilated, EF 25%, diffuse HK, mild AS, mildly dilated LA, trivial pericardial effusion.  . CKD (chronic kidney disease), stage III ([place])   . Complication of anesthesia    Slow to awaken  . Diabetic neuropathy (HCC)    severe; with chronic gait dysfunction  . Dysrhythmia   . Foot ulcer, left ([place])    poor healing  . GERD (gastroesophageal reflux disease)    H.pyloric  . History of echocardiogram 07/2015   Echo 1/17: EF 20-25%, inf-lat AK,  restrictive physio, mild AS (mean 9 mmHg), mild AI, mod MR, severe LAE, mod reduced RVSF, mild RAE, mild TR, PASP 50 mmHg  . HTN (hypertension)   . ICD (implantable cardiac defibrillator) in place 11/11   Medtronic dual cha,ber ICD with Optivol  . Neuropathy associated with endocrine disorder (HCC)    feet anad finger tips  . Tobacco abuse   . Uncontrolled type II diabetes mellitus ([place])    Past Surgical History:  Procedure Laterality Date  . CARDIOVERSION N/A 08/23/2017   Procedure: CARDIOVERSION;  Surgeon: Larey Dresser, MD;  Location[facility] ENDOSCOPY;  Service: Cardiovascular;  Laterality: N/A;  . CARDIOVERSION N/A 09/12/2020   Procedure: CARDIOVERSION;  Surgeon: Larey Dresser, MD;  Location: [facility] ENDOSCOPY;  Service: Cardiovascular;  Laterality: N/A;  STAT INR!!!  . CARDIOVERSION N/A 10/10/2020   Procedure: CARDIOVERSION;  Surgeon: Larey Dresser, MD;  Location[facility] ENDOSCOPY;  Service: Cardiovascular;  Laterality: N/A;  . COLONOSCOPY    . COLONOSCOPY WITH PROPOFOL N/A 12/01/2017   Procedure: COLONOSCOPY WITH PROPOFOL;  Surgeon: Arta Silence, MD;  Location: [place];  Service: Endoscopy;  Laterality: N/A;  . ESOPHAGOGASTRODUODENOSCOPY    . ICD GENERATOR CHANGEOUT N/A 02/01/2018   Procedure: ICD GENERATOR CHANGEOUT;  Surgeon: Thompson Grayer, MD;  Location: [place] CV LAB;  Service: Cardiovascular;  Laterality: N/A;  . ICD implantation  2011  by JA  . TEE WITHOUT CARDIOVERSION N/A 08/23/2017   Procedure: TRANSESOPHAGEAL ECHOCARDIOGRAM (TEE);  Surgeon: Larey Dresser, MD;  Location: [facility] ENDOSCOPY;  Service: Cardiovascular;  Laterality: N/A;  . TEE WITHOUT CARDIOVERSION N/A 10/10/2020   Procedure: TRANSESOPHAGEAL ECHOCARDIOGRAM (TEE);  Surgeon: Larey Dresser, MD;  Location: [facility] ENDOSCOPY;  Service: Cardiovascular;  Laterality: N/A;    ROS- all systems are reviewed and negative except as per HPI above  Current Outpatient Medications  Medication Sig Dispense Refill  .  amiodarone (PACERONE) 200 MG tablet Take 2 tablets (400 mg total) by mouth 2 (two) times daily for 7 days, THEN 1 tablet (200 mg total) 2 (two) times daily for 7 days, THEN 1 tablet (200 mg total) daily. 60 tablet 3  . apixaban (ELIQUIS) 5 MG TABS tablet Take 1 tablet (5 mg total) by mouth 2 (two) times daily. 60 tablet 6  . atorvastatin (LIPITOR) 80 MG tablet Take 1 tablet (80 mg total) by mouth daily. 90 tablet 1  . carvedilol (COREG) 6.25 MG tablet Take 2 tablets (12.5 mg total) by mouth 2 (two) times daily with a meal. 360 tablet 1  . ezetimibe (ZETIA) 10 MG tablet Take 1 tablet (10 mg total) by mouth daily. 90 tablet 3  . glipiZIDE (GLUCOTROL XL) 10 MG 24 hr tablet Take 1-2 tablets (10-20 mg total) by mouth daily with breakfast. Take 2 tablets if blood sugar > 200. 180 tablet 3  . isosorbide mononitrate (IMDUR) 30 MG 24 hr tablet Take 1/2 (one-half) tablet by mouth once daily 45 tablet 3  . Lancets (ACCU-CHEK SOFT TOUCH) lancets Use to test blood sugar once a day E11.9 100 each 3  . spironolactone (ALDACTONE) 25 MG tablet TAKE 1 TABLET BY MOUTH ONCE DAILY 90 tablet 3  . torsemide (DEMADEX) 20 MG tablet Take '60mg'$  (3 tabs) daily every other day alternating with '40mg'$  (2 tabs) daily every other day 90 tablet 3   No current facility-administered medications for this visit.    Physical Exam: Vitals:   11/27/20 1501  BP: 90/60  Pulse: 70  SpO2: 99%  Weight: 201 lb 3.2 oz (91.3 kg)  Height: '6\' 3"'$  (1.905 m)    GEN- The patient is chronically ill appearing, alert and oriented x 3 today.   Head- normocephalic, atraumatic Eyes-  Sclera clear, conjunctiva pink Ears- hearing intact Oropharynx- clear Lungs- Clear to ausculation bilaterally, normal work of breathing Chest- ICD pocket is well healed Heart- Regular rate and rhythm, no murmurs, rubs or gallops, PMI not laterally displaced GI- soft, NT, ND, + BS Extremities- no clubbing, cyanosis, or edema  ICD interrogation- reviewed in detail  today,  See PACEART report  ekg tracing ordered today is personally reviewed and shows a paced with long first degree AV block and IVCD with QRS > 150 msec.  Wt Readings from Last 3 Encounters:  11/27/20 201 lb 3.2 oz (91.3 kg)  11/26/20 202 lb (91.6 kg)  10/17/20 215 lb (97.5 kg)    Assessment and Plan:  1.  Chronic systolic dysfunction/ CAD/ mixed ischemic and nonischemic CM Appears dry today EF  < 20 % Stable on an appropriate medical regimen Normal ICD function See Pace Art report I have turned on rate response today as he A paces > 90 % Reduce torsemide from '20mg'$  BID to '20mg'$  daily today he is not device dependant today followed in ICM device clinic Given long first degree AV block and QRS > 150 msec, I have advised upgrade to  CRT.  We discussed at length.  He states "I feel better right now.  I dont think I want to do this now".' Daily weights advised  2. HTN Stable No change required today  3. Afib/ atrial flutter S/p CTI ablation 2011 Last [facility] 4/22.  He is on eliquis. Also on amiodarone.  Poor candidate for AF ablation given severe LA enlargement and advanced CHF. He will need to follow closely to avoid toxicity with amiodarone.  Risks, benefits and potential toxicities for medications prescribed and/or refilled reviewed with patient today.   Follow-up with Dr Aundra Dubin as scheduled Return to see EP APP every 6 months  Very complicated patient.  At risk for decompensation and hospitalization.  A high level of decision making was required for the visit today.  Thompson Grayer MD, [facility] 11/27/2020 3:07 PM

## 2020-12-12 ENCOUNTER — Other Ambulatory Visit: Payer: Self-pay

## 2020-12-12 ENCOUNTER — Other Ambulatory Visit (HOSPITAL_COMMUNITY): Payer: Self-pay

## 2020-12-12 ENCOUNTER — Ambulatory Visit (HOSPITAL_COMMUNITY)
Admission: RE | Admit: 2020-12-12 | Discharge: 2020-12-12 | Disposition: A | Discharge status: Home / Self Care | Payer: Medicare Other | Source: Ambulatory Visit | Attending: Cardiology | Admitting: Cardiology

## 2020-12-12 ENCOUNTER — Telehealth (HOSPITAL_COMMUNITY): Payer: Self-pay | Admitting: Pharmacy Technician

## 2020-12-12 ENCOUNTER — Encounter (HOSPITAL_COMMUNITY): Payer: Self-pay | Admitting: Cardiology

## 2020-12-12 VITALS — Wt 196.8 lb

## 2020-12-12 DIAGNOSIS — I4892 Unspecified atrial flutter: Secondary | ICD-10-CM | POA: Insufficient documentation

## 2020-12-12 DIAGNOSIS — I428 Other cardiomyopathies: Secondary | ICD-10-CM | POA: Insufficient documentation

## 2020-12-12 DIAGNOSIS — I255 Ischemic cardiomyopathy: Secondary | ICD-10-CM | POA: Insufficient documentation

## 2020-12-12 DIAGNOSIS — Z7982 Long term (current) use of aspirin: Secondary | ICD-10-CM | POA: Insufficient documentation

## 2020-12-12 DIAGNOSIS — Z79899 Other long term (current) drug therapy: Secondary | ICD-10-CM | POA: Insufficient documentation

## 2020-12-12 DIAGNOSIS — N183 Chronic kidney disease, stage 3 unspecified: Secondary | ICD-10-CM | POA: Insufficient documentation

## 2020-12-12 DIAGNOSIS — I480 Paroxysmal atrial fibrillation: Secondary | ICD-10-CM | POA: Insufficient documentation

## 2020-12-12 DIAGNOSIS — E785 Hyperlipidemia, unspecified: Secondary | ICD-10-CM

## 2020-12-12 DIAGNOSIS — E1122 Type 2 diabetes mellitus with diabetic chronic kidney disease: Secondary | ICD-10-CM | POA: Diagnosis not present

## 2020-12-12 DIAGNOSIS — Z7984 Long term (current) use of oral hypoglycemic drugs: Secondary | ICD-10-CM | POA: Insufficient documentation

## 2020-12-12 DIAGNOSIS — Z7901 Long term (current) use of anticoagulants: Secondary | ICD-10-CM | POA: Insufficient documentation

## 2020-12-12 DIAGNOSIS — I350 Nonrheumatic aortic (valve) stenosis: Secondary | ICD-10-CM | POA: Diagnosis not present

## 2020-12-12 DIAGNOSIS — Z8249 Family history of ischemic heart disease and other diseases of the circulatory system: Secondary | ICD-10-CM | POA: Insufficient documentation

## 2020-12-12 DIAGNOSIS — I5022 Chronic systolic (congestive) heart failure: Secondary | ICD-10-CM | POA: Diagnosis not present

## 2020-12-12 DIAGNOSIS — I130 Hypertensive heart and chronic kidney disease with heart failure and stage 1 through stage 4 chronic kidney disease, or unspecified chronic kidney disease: Secondary | ICD-10-CM | POA: Insufficient documentation

## 2020-12-12 DIAGNOSIS — F17210 Nicotine dependence, cigarettes, uncomplicated: Secondary | ICD-10-CM | POA: Diagnosis not present

## 2020-12-12 DIAGNOSIS — I2510 Atherosclerotic heart disease of native coronary artery without angina pectoris: Secondary | ICD-10-CM | POA: Diagnosis not present

## 2020-12-12 DIAGNOSIS — E1169 Type 2 diabetes mellitus with other specified complication: Secondary | ICD-10-CM

## 2020-12-12 LAB — CBC
HCT: 49.7 % (ref 39.0–52.0)
Hemoglobin: 16.1 g/dL (ref 13.0–17.0)
MCH: 29.1 pg (ref 26.0–34.0)
MCHC: 32.4 g/dL (ref 30.0–36.0)
MCV: 89.7 fL (ref 80.0–100.0)
Platelets: 165 10*3/uL (ref 150–400)
RBC: 5.54 MIL/uL (ref 4.22–5.81)
RDW: 17.1 % — ABNORMAL HIGH (ref 11.5–15.5)
WBC: 7.5 10*3/uL (ref 4.0–10.5)
nRBC: 0 % (ref 0.0–0.2)

## 2020-12-12 LAB — COMPREHENSIVE METABOLIC PANEL
ALT: 43 U/L (ref 0–44)
AST: 25 U/L (ref 15–41)
Albumin: 3.4 g/dL — ABNORMAL LOW (ref 3.5–5.0)
Alkaline Phosphatase: 88 U/L (ref 38–126)
Anion gap: 10 (ref 5–15)
BUN: 41 mg/dL — ABNORMAL HIGH (ref 8–23)
CO2: 24 mmol/L (ref 22–32)
Calcium: 9.1 mg/dL (ref 8.9–10.3)
Chloride: 102 mmol/L (ref 98–111)
Creatinine, Ser: 2.15 mg/dL — ABNORMAL HIGH (ref 0.61–1.24)
GFR, Estimated: 32 mL/min — ABNORMAL LOW (ref >60)
Glucose, Bld: 135 mg/dL — ABNORMAL HIGH (ref 70–99)
Potassium: 4.7 mmol/L (ref 3.5–5.1)
Sodium: 136 mmol/L (ref 135–145)
Total Bilirubin: 1.3 mg/dL — ABNORMAL HIGH (ref 0.3–1.2)
Total Protein: 6.9 g/dL (ref 6.5–8.1)

## 2020-12-12 LAB — LIPID PANEL
Cholesterol: 99 mg/dL (ref 0–200)
HDL: 31 mg/dL — ABNORMAL LOW (ref >40)
LDL Cholesterol: 55 mg/dL (ref 0–99)
Total CHOL/HDL Ratio: 3.2 RATIO
Triglycerides: 66 mg/dL (ref <150)
VLDL: 13 mg/dL (ref 0–40)

## 2020-12-12 LAB — TSH: TSH: 3.141 u[IU]/mL (ref 0.350–4.500)

## 2020-12-12 MED ORDER — TORSEMIDE 20 MG PO TABS: 20.0000 mg | ORAL_TABLET | ORAL | 3 refills | Status: DC

## 2020-12-12 MED ORDER — DAPAGLIFLOZIN PROPANEDIOL 10 MG PO TABS: 10.0000 mg | ORAL_TABLET | Freq: Every day | ORAL | 11 refills | Status: DC

## 2020-12-12 NOTE — Patient Instructions (Signed)
Start Farxiga 10 mg Daily  Decrease Torsemide to 20 mg every other day   Labs done today, your results will be available in MyChart, we will contact you for abnormal readings.  Your physician recommends that you return for lab work in: 1-2 weeks  You have been referred back to Dr Rayann Heman, his office will call you for an appointment  Your physician recommends that you schedule a follow-up appointment in: 2 months  If you have any questions or concerns before your next appointment please send [place] a message through [place] or call our office at [phone].    TO LEAVE A MESSAGE FOR THE NURSE SELECT OPTION 2, PLEASE LEAVE A MESSAGE INCLUDING: YOUR NAME DATE OF BIRTH CALL BACK NUMBER REASON FOR CALL**this is important as we prioritize the call backs  YOU WILL RECEIVE A CALL BACK THE SAME DAY AS LONG AS YOU CALL BEFORE 4:00 PM  At the [facility], you and your health needs are our priority. As part of our continuing mission to provide you with exceptional heart care, we have created designated Provider Care Teams. These Care Teams include your primary Cardiologist (physician) and Advanced Practice Providers (APPs- Physician Assistants and Nurse Practitioners) who all work together to provide you with the care you need, when you need it.   You may see any of the following providers on your designated Care Team at your next follow up: Dr Glori Bickers Dr [name] Dr Patrice [place], NP Lyda Jester, [place] Ginnie Smart Audry Riles, PharmD   Please be sure to bring in all your medications bottles to every appointment.

## 2020-12-12 NOTE — Telephone Encounter (Signed)
Advanced Heart Failure Patient Advocate Encounter  Upon further review, the patient is eligible to receive medication from the [facility] in [place]. He will be getting Eliquis and Farxiga from there. No need for assistance at this time.  Will be here to assist in the future as needed.  Charlann Boxer, CPhT

## 2020-12-13 NOTE — Progress Notes (Signed)
Patient ID: [name], male   DOB: 1950/10/26, 70 y.o.   MRN: [id number] PCP: Dr. Jerline Pain Cardiology: Dr. Aundra Dubin  70 y.o. with history of CAD, mixed ischemic/nonischemic cardiomyopathy, and diabetes who presents for followup. He initially was hospitalized in 5/11 with acute decompensated CHF after several days of shortness of breath, orthopnea, and PND. He was diuresed and left/right heart catheterization was done. Left heart cath showed large, ectatic coronaries with slow flow and moderate to severe diffuse distal vessel disease (consistent with diabetes) without good interventional options. EF was 15-20% by echo. He was put on milrinone and further diuresed for several more days. He was titrated off milrinone and discharged on an oral medication regimen. He later developed atrial flutter requiring cardioversion. He remains in NSR.  Patient had Medtronic ICD placed in 11/11.  Repeat echo in 10/12 showed EF 25%.  Most recent echo in 1/19 showed EF 25% with severe LV dilation. He was noted to be in atrial fibrillation again in 2/19.  He had TEE-guided DCCV in 2/19 back to NSR. He has not been able tolerate even 1/2 tab tid of Bidil due to lightheadedness.   He went into atrial fibrillation in 2/22 with increased RV pacing and worsening CHF.  He had DCCV back to NSR in 3/22. However, it appears that a few days after DCCV, he went back into atrial fibrillation.  In 4/22, he had TEE-guided DCCV.  TEE in 4/22 showed EF < 20%, no thrombus, moderately decreased RV systolic function, moderate RV enlargement, moderate TR, functionally bicuspid aortic valve with mild AI (no AS).   He returns for followup of CHF.  He is still in NSR today. Very poor balance due to severe diabetic peripheral neuropathy.  He walks very little.  Uses wheelchair mostly.  Uses walker for very short distances.  He gets tired very easily with his walker but is not particularly short of breath.  No chest pain.  Not lightheaded.  Appetite is  better, feels better in NSR.   Medtronic device reviewed: Optivol with fluid index < threshold, he is in NSR.  ECG (5/22, personally reviewed): a-paced with IVCD QRS 162 msec  Labs (9/11): LDL 61, HDL 26, K 4.8, creatinine 1.6  Labs (10/11): K 4.3, creatinine 1.3, BNP 165  Labs (11/11): K 4.9, creatinine 1.6  Labs (12/11): K 4.8, creatinine 1.7  Labs (2/12): LDL 110, HDL 28  Labs (3/12): K 4.1, creatinine 1.7 Labs (6/12): K 4.4, creatinine 1.4, BNP 125 Labs (10/12): K 4.8, creatinine 1.4 Labs (12/12): K 4.4, creatinine 1.5, BNP 20 Labs (3/13): K 5, creatinine 1.5, LDL 43, HDL 31 Labs (8/13): K 4.2, creatinine 1.3, BNP 158 Labs (05/03/14): K 4.6 Creatinine 1.4  Labs (9/16): K 4.4, creatinine 1.5 Labs (5/18): K 4.7, creatinine 1.63, pro-BNP 5249, hgb 14.4 Labs (12/18): K 4.5, creatinine 1.99, LDL 127, HDL 28, hgb 16.1 Labs (2/19): K 4.7, creatinine 2.46, LDL 78, HDL 26 Labs (11/20): K 4.9, creatinine 1.95 Labs (3/22): K 4.7, creatinine 2.9, BNP 2121 Labs (4/22): myeloma panel negative, K 4.4, creatinine 1.93  Allergies:  1) ! Erythromycin   Past Medical History:  1. CAD: LHC (5/11) with 50-60% mid LAD, diffuse moderate distal LAD up to 60-70%, small OM2 subtotally occluded, moderate to severe diffuse distal PLOM disease, severe diffuse distal RCA, PDA, and PLV disease. Coronaries were ectatic with slow flow. Patient was started on Plavix given concern for thrombus formation in the ectatic coronaries. He stopped Plavix after starting coumadin  with atrial flutter.  2. Chronic systolic CHF: Likely mixed ischemic/nonischemic. Echo (5/11) with EF 15-20%, severe global hypokinesis, mild MR, moderate diastolic dysfunction, mildly decreased RV systolic function. RHC (5/11) with mean RA 14 mmHg, PA 38/26, mean PCWP 25 mmHg, CI 1.8. Repeat echo (8/11) with EF 0000000, severe diastolic dysfunction, mild MR, RV normal size and systolic function. Per official read, cannot rule out apical clot but it  does appear to be only trabeculations. TEE (9/11): EF 15% with global hypokinesis, mild to moderate MR, no LV thrombus and no LAA thrombus.  Echo (10/12): EF 25%.  Echo (1/17) with EF 20-25%, severe LV dilation, mild aortic stenosis with AVA 1.5 cm^2, moderate MR, moderately dilated RV with moderately decreased RV systolic function, PASP 50 mmHg. Medtronic ICD.  - Echo (1/19): EF 25%, severe LV dilation, mild RV dilation with severely decreased systolic function, mild-moderate MR.  - TEE (2/19): EF 25%, moderately dilated LV, mildly dilated RV with moderately decreased systolic function, moderate LAE/RAE, moderate TR, peak RV-RA gradient 56 mmHg, functionally bicuspid aortic valve without aortic stenosis.  - TEE (4/22): EF < 20%, no thrombus, moderately decreased RV systolic function, moderate RV enlargement, moderate TR, functionally bicuspid aortic valve with mild AI (no AS). 3. Type II diabetes 4. HTN  5. Smoker  6. Severe diabetic neuropathy with chronic gait dysfunction  7. History of left foot ulcer with poor healing  8. Atrial flutter s/p DCCV 9/11  9. Aortic stenosis: Mild by echo in 1/17. 10. Atrial fibrillation: Paroxysmal.  - DCCV to NSR in 2/19.  - DCCV to NSR in 4/22.  11. CKD: Stage 3.  Suspect diabetic nephropathy.  12. Functionally bicuspid aortic valve.   Family History:  Family History of Colon CA 1st degree relative <60 9other relative)  Heart disease (grandparent)  Stroke (other relative)  dm only in secondary relatives   Social History:  Pt lives in [place]. 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    Current Outpatient Medications  Medication Sig Dispense Refill   amiodarone (PACERONE) 200 MG tablet Take 200 mg by mouth daily.     apixaban (ELIQUIS) 5 MG TABS tablet Take 1 tablet (5 mg total) by mouth 2 (two) times daily.  60 tablet 6   atorvastatin (LIPITOR) 80 MG tablet Take 80 mg by mouth daily.     carvedilol (COREG) 6.25 MG tablet Take 2 tablets (12.5 mg total) by mouth 2 (two) times daily with a meal. 360 tablet 1   dapagliflozin propanediol (FARXIGA) 10 MG TABS tablet Take 1 tablet (10 mg total) by mouth daily before breakfast. 30 tablet 11   ezetimibe (ZETIA) 10 MG tablet Take 1 tablet (10 mg total) by mouth daily. 90 tablet 3   glipiZIDE (GLUCOTROL XL) 10 MG 24 hr tablet Take 1-2 tablets (10-20 mg total) by mouth daily with breakfast. Take 2 tablets if blood sugar > 200. 180 tablet 3   isosorbide mononitrate (IMDUR) 30 MG 24 hr tablet Take 1/2 (one-half) tablet by mouth once daily 45 tablet 3   Lancets (ACCU-CHEK SOFT TOUCH) lancets Use to test blood sugar once a day E11.9 100 each 3   spironolactone (ALDACTONE) 25 MG tablet TAKE 1 TABLET BY MOUTH ONCE DAILY 90 tablet 3   torsemide (DEMADEX) 20 MG tablet Take 1 tablet (20 mg total) by mouth every other  day. 90 tablet 3   No current facility-administered medications for this encounter.    Wt 89.3 kg (196 lb 12.8 oz)   BMI 24.60 kg/m  General: NAD Neck: No JVD, no thyromegaly or thyroid nodule.  Lungs: Clear to auscultation bilaterally with normal respiratory effort. CV: Nondisplaced PMI.  Heart regular S1/S2, no S3/S4, no murmur.  No peripheral edema.  No carotid bruit.  Weak pedal pulses.  Abdomen: Soft, nontender, no hepatosplenomegaly, no distention.  Skin: Intact without lesions or rashes.  Neurologic: Alert and oriented x 3.  Psych: Normal affect. Extremities: No clubbing or cyanosis.  HEENT: Normal.   Assessment/Plan:  1. Atrial fibrillation:  Prior atrial flutter in 2011.  He was noted to be in atrial fibrillation in 2/19 and underwent TEE-guided DCCV to NSR.  DCCV again in 3/22 to NSR. TEE-guided DCCV again in 4/22.   He does not tolerate AF well with increased RV pacing percentage and worsening HF.  He is in NSR today.  - Continue  Eliquis 5 mg bid.  - He has seen EP, not a good ablation candidate.  - Continue amiodarone.  Check LFTs/TSH today, will need regular eye exam.   2. CKD stage 3: Suspect diabetic nephropathy.  - BMET today.   - Has been referred to nephrology.  3. Chronic systolic heart failure: Mixed ischemic/nonischemic cardiomyopathy. Has Medtronic ICD.  Last echo in 1/19 with EF 25%, severely dilated LV, severely decreased RV systolic function. He has significant distal vessel CAD on prior cath as well as a history of poorly controlled diabetes. He has NYHA class III symptoms (worse in AF) though not very active due to severe peripheral neuropathy. Today, he is not volume overloaded by exam or Optivol.  He has had a hard time tolerating medications due to orthostatic symptoms and also with baseline CKD stage 3. Symptoms are worse when he is in atrial fibrillation.  Most recent creatinine 1.93.   - Continue Coreg 12.5 mg bid.  - He was unable to tolerate even low dose losartan and creatinine has been elevated. Will not retry ARB or ARNI at this time.  - Unable to tolerate Bidil 1/2 tab tid due to lightheadedness.  - Continue spironolactone 25 mg daily for now, though may have to stop if K/creatinine rise higher.  - Decrease torsemide to 20 mg every other day and add Farxiga 10 mg daily. BMET today and in 10 days.   - He has IVCD with QRS 162 msec, he is interested in CRT upgrade.  Will message Dr. Rayann Heman.  4. CAD: Diffuse moderate to severe distal vessel disease on last cath with ectatic coronaries with slow flow. No interventional target.  No ischemic chest pain.  - Continue ASA 81 daily.  - Continue atorvastatin and Zetia, check lipids/LFTs today.   5. Smoking: I strongly encouraged him to quit smoking.    [name] 12/13/2020

## 2020-12-16 ENCOUNTER — Ambulatory Visit (INDEPENDENT_AMBULATORY_CARE_PROVIDER_SITE_OTHER): Payer: Medicare Other

## 2020-12-16 DIAGNOSIS — Z95810 Presence of automatic (implantable) cardiac defibrillator: Secondary | ICD-10-CM | POA: Diagnosis not present

## 2020-12-16 DIAGNOSIS — I5022 Chronic systolic (congestive) heart failure: Secondary | ICD-10-CM

## 2020-12-18 NOTE — Progress Notes (Signed)
EPIC Encounter for ICM Monitoring  Patient Name: Anthony Rubio. is a 70 y.o. male Date: 12/18/2020 Primary Care Physican: Vivi Barrack, MD Primary Cardiologist: Aundra Dubin Electrophysiologist: Allred 12/18/2020 Weight: 203 lbs   Time in AT/AF  <0.1 hr/day (<0.1%)         Spoke with patient and reports feeling well at this time.  Denies fluid symptoms.      Optivol thoracic impedance suggesting normal fluid levels   Prescribed:  Torsemide 20 mg Take 1 tablet ('20mg'$  tota) by mouth every other day. Spironolactone 25 mg take 1 tablet by mouth daily.    Labs: 12/12/2020 Creatinine 2.15, BUN 41, Potassium 4.7, Sodium 136, GFR 32 10/17/2020 Creatinine 1.93, BUN 29, Potassium 4.4, Sodium 136, GFR 37  10/07/2020 Creatinine 3.16, BUN 57, Potassium 4.5, Sodium 137, GFR 20  09/30/2020 Creatinine 3.08, BUN 62, Potassium 4.4, Sodium 140 09/23/2020 Creatinine 2.59, BUN 60, Potassium 4.3, Sodium 140, GFR 26 09/09/2020 Creatinine 2.90, BUN 61, Potassium 4.7, Sodium 140, GFR 22  A complete set of results can be found in Results Review   Recommendations:   Encouraged to call if experiencing any fluid symptoms.   Follow-up plan: ICM clinic phone appointment on 01/20/2021.   91 day device clinic remote transmission 02/23/2021.      EP/Cardiology Office Visits:  02/11/2021 with [facility] PA/NP.  01/30/2021 with Dr Rayann Heman.   Copy of ICM check sent to Dr. Rayann Heman.   3 month ICM trend: 12/16/2020.    1 Year ICM trend:       [name], RN 12/18/2020 4:16 PM

## 2020-12-23 ENCOUNTER — Other Ambulatory Visit: Payer: Self-pay

## 2020-12-23 ENCOUNTER — Ambulatory Visit (HOSPITAL_COMMUNITY)
Admission: RE | Admit: 2020-12-23 | Discharge: 2020-12-23 | Disposition: A | Discharge status: Home / Self Care | Payer: Medicare Other | Source: Ambulatory Visit | Attending: Internal Medicine | Admitting: Internal Medicine

## 2020-12-23 DIAGNOSIS — I5022 Chronic systolic (congestive) heart failure: Secondary | ICD-10-CM | POA: Diagnosis not present

## 2020-12-23 LAB — BASIC METABOLIC PANEL
Anion gap: 8 (ref 5–15)
BUN: 35 mg/dL — ABNORMAL HIGH (ref 8–23)
CO2: 26 mmol/L (ref 22–32)
Calcium: 8.8 mg/dL — ABNORMAL LOW (ref 8.9–10.3)
Chloride: 106 mmol/L (ref 98–111)
Creatinine, Ser: 2.39 mg/dL — ABNORMAL HIGH (ref 0.61–1.24)
GFR, Estimated: 28 mL/min — ABNORMAL LOW (ref >60)
Glucose, Bld: 78 mg/dL (ref 70–99)
Potassium: 4.1 mmol/L (ref 3.5–5.1)
Sodium: 140 mmol/L (ref 135–145)

## 2021-01-03 ENCOUNTER — Telehealth: Payer: Self-pay | Admitting: Internal Medicine

## 2021-01-03 MED ORDER — AMIODARONE HCL 200 MG PO TABS: 200.0000 mg | ORAL_TABLET | Freq: Every day | ORAL | 3 refills | Status: AC

## 2021-01-03 NOTE — Telephone Encounter (Signed)
Patient called to ask that Dr. Rayann Heman fax over prescription for these two medication  amiodarone (PACERONE) 200 MG tablet and dapagliflozin propanediol (FARXIGA) 10 MG TABS tablet. [facility] so that Patients can get the medication cheaper. Dr Helene Kelp [facility] hospital in [place]

## 2021-01-03 NOTE — Telephone Encounter (Signed)
Placed Amiodarone prescription in medical records to be faxed.   Forwarded message to [facility] for [place].

## 2021-01-07 MED ORDER — DAPAGLIFLOZIN PROPANEDIOL 10 MG PO TABS: 10.0000 mg | ORAL_TABLET | Freq: Every day | ORAL | 11 refills | Status: DC

## 2021-01-07 NOTE — Telephone Encounter (Signed)
Farxiga sent to [facility]

## 2021-01-08 ENCOUNTER — Telehealth: Payer: Self-pay | Admitting: Internal Medicine

## 2021-01-08 NOTE — Telephone Encounter (Signed)
Pt c/o medication issue:  1. Name of Medication: dapagliflozin propanediol (FARXIGA) 10 MG TABS tablet  2. How are you currently taking this medication (dosage and times per day)? Not currently taking  3. Are you having a reaction (difficulty breathing--STAT)? NO  4. What is your medication issue? [facility] because they did not send him to [place].They stated they need reasons why he is on this medication

## 2021-01-14 MED ORDER — DAPAGLIFLOZIN PROPANEDIOL 10 MG PO TABS: 10.0000 mg | ORAL_TABLET | Freq: Every day | ORAL | 11 refills | Status: DC

## 2021-01-14 NOTE — Telephone Encounter (Signed)
Have been unable to reach pt via phone to discuss, n/a and VM is full, last OV note and new RX with dx all faxed to [place]

## 2021-01-20 ENCOUNTER — Ambulatory Visit (INDEPENDENT_AMBULATORY_CARE_PROVIDER_SITE_OTHER): Payer: Medicare Other

## 2021-01-20 DIAGNOSIS — I5022 Chronic systolic (congestive) heart failure: Secondary | ICD-10-CM

## 2021-01-20 DIAGNOSIS — Z95810 Presence of automatic (implantable) cardiac defibrillator: Secondary | ICD-10-CM

## 2021-01-24 ENCOUNTER — Telehealth: Payer: Self-pay

## 2021-01-24 NOTE — Telephone Encounter (Signed)
Remote ICM transmission received.  Attempted call to patient regarding ICM remote transmission and left detailed message per DPR.  Advised to return call for any fluid symptoms or questions. Next ICM remote transmission scheduled 02/24/2021.    

## 2021-01-24 NOTE — Progress Notes (Signed)
EPIC Encounter for ICM Monitoring  Patient Name: Anthony Rubio. is a 70 y.o. male Date: 01/24/2021 Primary Care Physican: Vivi Barrack, MD Primary Cardiologist: Aundra Dubin Electrophysiologist: Allred 01/24/2021 Weight: 210 lbs (previously 203 lbs)   Time in AT/AF  <0.1 hr/day (<0.1%)         Spoke with patient and reports weight gain over the last month.   He self adjusted Torsemide to 10 mg daily instead of 20 mg every other day for the last month because he thought his weight was getting too low.   Optivol thoracic impedance suggesting fluid levels trending just below baseline.   Prescribed:  Torsemide 20 mg Take 1 tablet ('20mg'$  tota) by mouth every other day. Spironolactone 25 mg take 1 tablet by mouth daily.  Farxiga 10 mg take 1 tablet daily.    Labs: 12/23/2020 Creatinine 2.39, BUN 35, Potassium 4.1, Sodium 140, GFR 28 12/12/2020 Creatinine 2.15, BUN 41, Potassium 4.7, Sodium 136, GFR 32 10/17/2020 Creatinine 1.93, BUN 29, Potassium 4.4, Sodium 136, GFR 37 10/07/2020 Creatinine 3.16, BUN 57, Potassium 4.5, Sodium 137, GFR 20  09/30/2020 Creatinine 3.08, BUN 62, Potassium 4.4, Sodium 140 09/23/2020 Creatinine 2.59, BUN 60, Potassium 4.3, Sodium 140, GFR 26 09/09/2020 Creatinine 2.90, BUN 61, Potassium 4.7, Sodium 140, GFR 22  A complete set of results can be found in Results Review   Recommendations:   Advised to take Furosemide 20 mg every other day as prescribed.    Follow-up plan: ICM clinic phone appointment on 02/06/2021 to recheck fluid levels.   91 day device clinic remote transmission 02/23/2021.      EP/Cardiology Office Visits:  02/11/2021 with [facility] PA/NP.  01/30/2021 with Dr Rayann Heman.   Copy of ICM check sent to Dr. Rayann Heman.   3 month ICM trend: 01/20/2021.    1 Year ICM trend:       [name], RN 01/24/2021 3:24 PM

## 2021-01-30 ENCOUNTER — Encounter: Payer: Medicare Other | Admitting: Internal Medicine

## 2021-02-03 ENCOUNTER — Encounter: Payer: Self-pay | Admitting: *Deleted

## 2021-02-03 ENCOUNTER — Ambulatory Visit (INDEPENDENT_AMBULATORY_CARE_PROVIDER_SITE_OTHER): Payer: Medicare Other | Admitting: Internal Medicine

## 2021-02-03 ENCOUNTER — Encounter: Payer: Self-pay | Admitting: Internal Medicine

## 2021-02-03 ENCOUNTER — Ambulatory Visit (INDEPENDENT_AMBULATORY_CARE_PROVIDER_SITE_OTHER): Payer: Medicare Other

## 2021-02-03 ENCOUNTER — Other Ambulatory Visit: Payer: Self-pay

## 2021-02-03 VITALS — BP 80/54 | HR 77 | Ht 75.0 in | Wt 199.4 lb

## 2021-02-03 DIAGNOSIS — I2510 Atherosclerotic heart disease of native coronary artery without angina pectoris: Secondary | ICD-10-CM | POA: Diagnosis not present

## 2021-02-03 DIAGNOSIS — I480 Paroxysmal atrial fibrillation: Secondary | ICD-10-CM | POA: Diagnosis not present

## 2021-02-03 DIAGNOSIS — I483 Typical atrial flutter: Secondary | ICD-10-CM

## 2021-02-03 DIAGNOSIS — I10 Essential (primary) hypertension: Secondary | ICD-10-CM

## 2021-02-03 DIAGNOSIS — I429 Cardiomyopathy, unspecified: Secondary | ICD-10-CM

## 2021-02-03 DIAGNOSIS — I5022 Chronic systolic (congestive) heart failure: Secondary | ICD-10-CM

## 2021-02-03 NOTE — Patient Instructions (Signed)
Medication Instructions:  Your physician recommends that you continue on your current medications as directed. Please refer to the Current Medication list given to you today.  Labwork: None ordered.  Testing/Procedures: Your physician has recommended that you have a defibrillator inserted. An implantable cardioverter defibrillator (ICD) is a small device that is placed in your chest or, in rare cases, your abdomen. This device uses electrical pulses or shocks to help control life-threatening, irregular heartbeats that could lead the heart to suddenly stop beating (sudden cardiac arrest). Leads are attached to the ICD that goes into your heart. This is done in the hospital and usually requires an overnight stay. Please see the instruction sheet given to you today for more information.   Remote monitoring is used to monitor your ICD from home. This monitoring reduces the number of office visits required to check your device to one time per year. It allows [place] to keep an eye on the functioning of your device to ensure it is working properly. You are scheduled for a device check from home on 05/05/21. You may send your transmission at any time that day. If you have a wireless device, the transmission will be sent automatically. After your physician reviews your transmission, you will receive a postcard with your next transmission date.  Any Other Special Instructions Will Be Listed Below (If Applicable).  If you need a refill on your cardiac medications before your next appointment, please call your pharmacy.   Cardioverter Defibrillator Implantation An implantable cardioverter defibrillator (ICD) is a device that identifies and corrects abnormal heart rhythms. Cardioverter defibrillator implantation is a surgery to place an ICD under the skin in the chest or abdomen. An ICD has a battery, a small computer (pulse generator), and wires (leads) that go into the heart. The ICD detects and corrects two types  of dangerous irregular heart rhythms (arrhythmias): A rapid heart rhythm in the lower chambers of the heart (ventricles). This is called ventricular tachycardia. The ventricles contracting in an uncoordinated way. This is called ventricular fibrillation. There are different types of ICDs, and the electrical signals from the ICD can be programmed differently based on the condition being treated. The electrical signals from the ICD can be low-energy pulses, high-energy shocks, or a combination of the two. The low-energy pulses are generally used to restore the heartbeat to normal when it is either too slow (bradycardia) or too fast. These pulses are painless. The high-energy shocks are used to treat abnormal rhythms such as ventricular tachycardia or ventricularfibrillation. This shock may feel like a strong jolt in the chest. Your health care provider may recommend an ICD if you have: Had a ventricular arrhythmia in the past. A damaged heart because of a disease or heart condition. A weakened heart muscle from a heart attack or cardiac arrest. A congenital heart defect. Long QT syndrome, which is a disorder of the heart's electrical system. Brugada syndrome, which is a condition that causes a disruption of the heart's normal rhythm. Tell a health care provider about: Any allergies you have. All medicines you are taking, including vitamins, herbs, eye drops, creams, and over-the-counter medicines. Any problems you or family members have had with anesthetic medicines. Any blood disorders you have. Any surgeries you have had. Any medical conditions you have. Whether you are pregnant or may be pregnant. What are the risks? Generally, this is a safe procedure. However, problems may occur, including: Infection. Bleeding. Allergic reactions to medicines used during the procedure. Blood clots. Swelling or bruising.  Damage to nearby structures or organs, such as nerves, lungs, blood vessels, or the  heart where the ICD leads or pulse generator is implanted. What happens before the procedure? Staying hydrated Follow instructions from your health care provider about hydration, which may include: Up to 2 hours before the procedure - you may continue to drink clear liquids, such as water, clear fruit juice, black coffee, and plain tea.  Eating and drinking restrictions Follow instructions from your health care provider about eating and drinking, which may include: 8 hours before the procedure - stop eating heavy meals or foods, such as meat, fried foods, or fatty foods. 6 hours before the procedure - stop eating light meals or foods, such as toast or cereal. 6 hours before the procedure - stop drinking milk or drinks that contain milk. 2 hours before the procedure - stop drinking clear liquids. Medicines Ask your health care provider about: Changing or stopping your regular medicines. This is especially important if you are taking diabetes medicines or blood thinners. Taking medicines such as aspirin and ibuprofen. These medicines can thin your blood. Do not take these medicines unless your health care provider tells you to take them. Taking over-the-counter medicines, vitamins, herbs, and supplements. Tests You may have an exam or testing. These may include: Blood tests. A test to check the electrical signals in your heart (electrocardiogram, ECG). Imaging tests, such as a chest X-ray. Echocardiogram. This is an ultrasound of your heart to evaluate your heart structures and function. An event monitor or Holter monitor to wear at home. General instructions Do not use any products that contain nicotine or tobacco for at least 4 weeks before the procedure. These products include cigarettes, chewing tobacco, and vaping devices, such as e-cigarettes. If you need help quitting, ask your health care provider. Ask your health care provider: How your procedure site will be marked. What steps  will be taken to help prevent infection. These may include: Removing hair at the surgery site. Washing skin with a germ-killing soap. Taking antibiotic medicine. You may be asked to shower with a germ-killing soap. Plan to have a responsible adult take you home from the hospital or clinic. What happens during the procedure?  Small monitors will be put on your body. They will be used to check your heart rate, blood pressure, and oxygen level. A pair of sticky pads (defibrillator pads) may be placed on your back and chest. These pads are able to pace your heart as needed during the procedure. An IV will be inserted into one of your veins. You will be given one or more of the following: A medicine to help you relax (sedative). A medicine to numb the area (local anesthetic). A medicine to make you fall asleep(general anesthetic). A small incision will be made to create a deep pocket under the skin of your chest or abdomen. Leads will be guided through a blood vessel into your heart and attached to your heart muscles. Depending on the ICD, the leads may go into one ventricle, or they may go into both ventricles and into an upper chamber of the heart. An X-ray machine (fluoroscope) will be used to help guide the leads. The other end of the leads will be attached to the pulse generator. The pulse generator will be placed into the pocket under the skin. The ICD will be tested, and your health care provider will program the ICD for the condition being treated. The incision will be closed with stitches (sutures), skin  glue, adhesive strips, or staples. A bandage (dressing) will be placed over the incision. The procedure may vary among health care providers and hospitals. What happens after the procedure? Your blood pressure, heart rate, breathing rate, and blood oxygen level will be monitored until you leave the hospital or clinic. Your health care provider will also monitor your ICD to make sure it is  working properly. A chest X-ray will be taken to check that the ICD is in the right place. Do not raise the arm on the side of your procedure higher than your shoulder for as long as told by your health care provider. This is usually at least 6 weeks. You may be given an identification card explaining that you have an ICD. You will be given a remote home monitoring device to use with your ICD to allow your device to communicate with your clinic. Summary An implantable cardioverter defibrillator (ICD) is a device that identifies and corrects abnormal heart rhythms. Cardioverter defibrillator implantation is a surgery to place an ICD under the skin in the chest or abdomen. An ICD consists of a battery, a small computer (pulse generator), and wires (leads) that go into the heart. During the procedure, the ICD will be tested, and your health care provider will program the ICD for the condition being treated. After the procedure, a chest X-ray will be taken to check that the ICD is in the right place. This information is not intended to replace advice given to you by your health care provider. Make sure you discuss any questions you have with your healthcare provider. Document Revised: 12/20/2019 Document Reviewed: 12/20/2019 Elsevier Patient Education  [employer].

## 2021-02-03 NOTE — Progress Notes (Signed)
PCP: Vivi Barrack, MD Primary Cardiologist: Dr Aundra Dubin Primary EP: Dr Rayann Heman  Anthony Rubio. is a 70 y.o. male who presents today for routine electrophysiology followup.  Since last being seen in our clinic, the patient reports doing very well.  Today, he denies symptoms of palpitations, chest pain, shortness of breath,  lower extremity edema, dizziness, presyncope, syncope, or ICD shocks.  The patient is otherwise without complaint today.   Past Medical History:  Diagnosis Date   AICD (automatic cardioverter/defibrillator) present    Aortic stenosis    a. Mild by echo 04/2011.    Arthritis    Atrial flutter ([place])    s/p DCCV 9/11   CAD (coronary artery disease)    a. LHC (5/11) with 50-60% mid LAD, diffuse mod distal LAD up to 60-70%, small OM2 subtotally occ, mod-severe diffuse distal PLOM, severe diffuse distal RCA, PDA, and PLV  disease. Started on Plavix given concern for thrombus formation in the ectatic cors with slow flow. b. Stopped Plavix after starting Coumadin for a-flutter.   CHF (congestive heart failure) ([place])    a. Likely mixed ICM/NICM. Echo (5/11) with EF 15-20%, severe global HK, mild MR, mod d/d, mildly decreased RV fcn. RHC (5/11) with mean RA 14 mmHg, PA 38/26, mean PCWP 25 mmHg, CI 1.8.  b. TEE (9/11): EF 15% no LV thrombus, no LAA thrombus. c. s/p ICD 11/11. d. Last echo 04/2011: LV severely dilated, EF 25%, diffuse HK, mild AS, mildly dilated LA, trivial pericardial effusion.   CKD (chronic kidney disease), stage III (HCC)    Complication of anesthesia    Slow to awaken   Diabetic neuropathy (HCC)    severe; with chronic gait dysfunction   Dysrhythmia    Foot ulcer, left (HCC)    poor healing   GERD (gastroesophageal reflux disease)    H.pyloric   History of echocardiogram 07/2015   Echo 1/17: EF 20-25%, inf-lat AK, restrictive physio, mild AS (mean 9 mmHg), mild AI, mod MR, severe LAE, mod reduced RVSF, mild RAE, mild TR, PASP 50 mmHg   HTN (hypertension)     ICD (implantable cardiac defibrillator) in place 11/11   Medtronic dual cha,ber ICD with Optivol   Neuropathy associated with endocrine disorder ([place])    feet anad finger tips   Tobacco abuse    Uncontrolled type II diabetes mellitus ([place])    Past Surgical History:  Procedure Laterality Date   CARDIOVERSION N/A 08/23/2017   Procedure: CARDIOVERSION;  Surgeon: Larey Dresser, MD;  Location: [place];  Service: Cardiovascular;  Laterality: N/A;   CARDIOVERSION N/A 09/12/2020   Procedure: CARDIOVERSION;  Surgeon: Larey Dresser, MD;  Location: [place];  Service: Cardiovascular;  Laterality: N/A;  STAT INR!!!   CARDIOVERSION N/A 10/10/2020   Procedure: CARDIOVERSION;  Surgeon: Larey Dresser, MD;  Location: [place];  Service: Cardiovascular;  Laterality: N/A;   COLONOSCOPY     COLONOSCOPY WITH PROPOFOL N/A 12/01/2017   Procedure: COLONOSCOPY WITH PROPOFOL;  Surgeon: Arta Silence, MD;  Location: [facility] ENDOSCOPY;  Service: Endoscopy;  Laterality: N/A;   ESOPHAGOGASTRODUODENOSCOPY     ICD GENERATOR CHANGEOUT N/A 02/01/2018   Procedure: ICD GENERATOR CHANGEOUT;  Surgeon: Thompson Grayer, MD;  Location: [place] CV LAB;  Service: Cardiovascular;  Laterality: N/A;   ICD implantation  2011   by JA   TEE WITHOUT CARDIOVERSION N/A 08/23/2017   Procedure: TRANSESOPHAGEAL ECHOCARDIOGRAM (TEE);  Surgeon: Larey Dresser, MD;  Location: [facility] ENDOSCOPY;  Service: Cardiovascular;  Laterality: N/A;   TEE WITHOUT CARDIOVERSION N/A 10/10/2020   Procedure: TRANSESOPHAGEAL ECHOCARDIOGRAM (TEE);  Surgeon: Larey Dresser, MD;  Location: [facility] ENDOSCOPY;  Service: Cardiovascular;  Laterality: N/A;    ROS- all systems are reviewed and negative except as per HPI above  Current Outpatient Medications  Medication Sig Dispense Refill   amiodarone (PACERONE) 200 MG tablet Take 1 tablet (200 mg total) by mouth daily. 90 tablet 3   apixaban (ELIQUIS) 5 MG TABS tablet Take 1 tablet (5 mg total) by mouth  2 (two) times daily. 60 tablet 6   atorvastatin (LIPITOR) 80 MG tablet Take 80 mg by mouth daily.     carvedilol (COREG) 6.25 MG tablet Take 2 tablets (12.5 mg total) by mouth 2 (two) times daily with a meal. 360 tablet 1   dapagliflozin propanediol (FARXIGA) 10 MG TABS tablet Take 1 tablet (10 mg total) by mouth daily before breakfast. 30 tablet 11   ezetimibe (ZETIA) 10 MG tablet Take 1 tablet (10 mg total) by mouth daily. 90 tablet 3   glipiZIDE (GLUCOTROL XL) 10 MG 24 hr tablet Take 1-2 tablets (10-20 mg total) by mouth daily with breakfast. Take 2 tablets if blood sugar > 200. 180 tablet 3   isosorbide mononitrate (IMDUR) 30 MG 24 hr tablet Take 1/2 (one-half) tablet by mouth once daily 45 tablet 3   Lancets (ACCU-CHEK SOFT TOUCH) lancets Use to test blood sugar once a day E11.9 100 each 3   spironolactone (ALDACTONE) 25 MG tablet TAKE 1 TABLET BY MOUTH ONCE DAILY 90 tablet 3   torsemide (DEMADEX) 20 MG tablet Take 1 tablet (20 mg total) by mouth every other day. 90 tablet 3   No current facility-administered medications for this visit.    Physical Exam: There were no vitals filed for this visit.  GEN- The patient is well appearing, alert and oriented x 3 today.   Head- normocephalic, atraumatic Eyes-  Sclera clear, conjunctiva pink Ears- hearing intact Oropharynx- clear Lungs- Clear to ausculation bilaterally, normal work of breathing Chest- ICD pocket is well healed Heart- Regular rate and rhythm, no murmurs, rubs or gallops, PMI not laterally displaced GI- soft, NT, ND, + BS Extremities- no clubbing, cyanosis, or edema  ICD interrogation- reviewed in detail today,  See PACEART report  ekg tracing ordered today is personally reviewed and shows atrial paced, first degree AV block, IVCD with QRS 160 msec  Wt Readings from Last 3 Encounters:  12/12/20 196 lb 12.8 oz (89.3 kg)  11/27/20 201 lb 3.2 oz (91.3 kg)  11/26/20 202 lb (91.6 kg)    Assessment and Plan:  1.  Chronic  systolic dysfunction/ mixed ischemic and nonischemic CM euvolemic today Stable on an appropriate medical regimen Normal ICD function EF <20% See Pace Art report No changes today he is not device dependant today followed in ICM device clinic He has a long first degree AV block and QRS > 150 msec.  I have advised CRT upgrade.  I agree with Dr Oleh Genin thoughts on this. Risks, benefits, and alternatives to BiV ICD upgrade were discussed in detail today with patient and spouse.  The patient understands that risks include but are not limited to bleeding, infection, pneumothorax, perforation, tamponade, vascular damage, renal failure, MI, stroke, death, inappropriate shocks, damage to his existing leads, and lead dislodgement and wishes to proceed.  We will therefore schedule the procedure at the next available time.  Given limitations of scheduling, I will ask Dr Lovena Le to assist with  the procedure at the next available time. Hold eliquis 24 hours prior to the procedure.  2. HTN Continue coreg 6.'25mg'$  BID  3. Afib/ atrial flutter Doing well with eliquis He is on amiodarone He has severe LA enlargement and is a poor candidate for ablation  Risks, benefits and potential toxicities for medications prescribed and/or refilled reviewed with patient today.   Thompson Grayer MD, [facility] 02/03/2021 8:35 AM

## 2021-02-04 LAB — CUP PACEART REMOTE DEVICE CHECK
Battery Remaining Longevity: 83 mo
Battery Voltage: 2.98 V
Brady Statistic AP VP Percent: 4.89 %
Brady Statistic AP VS Percent: 94.9 %
Brady Statistic AS VP Percent: 0.02 %
Brady Statistic AS VS Percent: 0.2 %
Brady Statistic RA Percent Paced: 99.56 %
Brady Statistic RV Percent Paced: 5.72 %
Date Time Interrogation Session: 20220801170308
HighPow Impedance: 43 Ohm
HighPow Impedance: 52 Ohm
Implantable Lead Implant Date: 20111108
Implantable Lead Implant Date: 20111108
Implantable Lead Location: 753859
Implantable Lead Location: 753860
Implantable Lead Model: 5076
Implantable Lead Model: 6947
Implantable Pulse Generator Implant Date: 20190730
Lead Channel Impedance Value: 285 Ohm
Lead Channel Impedance Value: 342 Ohm
Lead Channel Impedance Value: 475 Ohm
Lead Channel Pacing Threshold Amplitude: 0.625 V
Lead Channel Pacing Threshold Amplitude: 1 V
Lead Channel Pacing Threshold Pulse Width: 0.4 ms
Lead Channel Pacing Threshold Pulse Width: 0.4 ms
Lead Channel Sensing Intrinsic Amplitude: 3 mV
Lead Channel Sensing Intrinsic Amplitude: 3.25 mV
Lead Channel Sensing Intrinsic Amplitude: 5.625 mV
Lead Channel Sensing Intrinsic Amplitude: 6.625 mV
Lead Channel Setting Pacing Amplitude: 2 V
Lead Channel Setting Pacing Amplitude: 2.5 V
Lead Channel Setting Pacing Pulse Width: 0.4 ms
Lead Channel Setting Sensing Sensitivity: 0.3 mV

## 2021-02-06 ENCOUNTER — Ambulatory Visit (INDEPENDENT_AMBULATORY_CARE_PROVIDER_SITE_OTHER): Payer: Medicare Other

## 2021-02-06 DIAGNOSIS — Z95810 Presence of automatic (implantable) cardiac defibrillator: Secondary | ICD-10-CM

## 2021-02-06 DIAGNOSIS — I5022 Chronic systolic (congestive) heart failure: Secondary | ICD-10-CM

## 2021-02-07 NOTE — Progress Notes (Signed)
EPIC Encounter for ICM Monitoring  Patient Name: Anthony Rubio. is a 70 y.o. male Date: 02/07/2021 Primary Care Physican: Vivi Barrack, MD Primary Cardiologist: Aundra Dubin Electrophysiologist: Allred 02/07/2021 Weight: 196 lbs   Time in AT/AF  0.0 hr/day (0.0%)         Spoke with patient and reports weight returned to baseline.   9/8 procedure scheduled to add a lead to defibrillator.   Optivol thoracic impedance suggesting fluid levels returned normal after taking prescribed Furosemide 20 mg every other day.   Prescribed:  Torsemide 20 mg Take 1 tablet ('20mg'$  tota) by mouth every other day. Spironolactone 25 mg take 1 tablet by mouth daily.  Farxiga 10 mg take 1 tablet daily.    Labs: 12/23/2020 Creatinine 2.39, BUN 35, Potassium 4.1, Sodium 140, GFR 28 12/12/2020 Creatinine 2.15, BUN 41, Potassium 4.7, Sodium 136, GFR 32 10/17/2020 Creatinine 1.93, BUN 29, Potassium 4.4, Sodium 136, GFR 37 10/07/2020 Creatinine 3.16, BUN 57, Potassium 4.5, Sodium 137, GFR 20  09/30/2020 Creatinine 3.08, BUN 62, Potassium 4.4, Sodium 140 09/23/2020 Creatinine 2.59, BUN 60, Potassium 4.3, Sodium 140, GFR 26 09/09/2020 Creatinine 2.90, BUN 61, Potassium 4.7, Sodium 140, GFR 22  A complete set of results can be found in Results Review   Recommendations:   No changes and encouraged to call if experiencing any fluid symptoms.   Follow-up plan: ICM clinic phone appointment on 03/03/2021.   91 day device clinic remote transmission 02/23/2021.      EP/Cardiology Office Visits:  02/11/2021 with [facility] PA/NP.     Copy of ICM check sent to Dr. Rayann Heman.   3 month ICM trend: 02/06/2021.    1 Year ICM trend:       [name], RN 02/07/2021 4:32 PM

## 2021-02-10 NOTE — Progress Notes (Signed)
Patient ID: Anthony Rubio., male   DOB: 05-16-1951, 70 y.o.   MRN: [id number] PCP: Dr. Jerline Pain Cardiology: Dr. Aundra Dubin  70 y.o. with history of CAD, mixed ischemic/nonischemic cardiomyopathy, and diabetes who presents for followup. He initially was hospitalized in 5/11 with acute decompensated CHF after several days of shortness of breath, orthopnea, and PND. He was diuresed and left/right heart catheterization was done. Left heart cath showed large, ectatic coronaries with slow flow and moderate to severe diffuse distal vessel disease (consistent with diabetes) without good interventional options. EF was 15-20% by echo. He was put on milrinone and further diuresed for several more days. He was titrated off milrinone and discharged on an oral medication regimen. He later developed atrial flutter requiring cardioversion. He remains in NSR.  Patient had Medtronic ICD placed in 11/11.  Repeat echo in 10/12 showed EF 25%.  Most recent echo in 1/19 showed EF 25% with severe LV dilation. He was noted to be in atrial fibrillation again in 2/19.  He had TEE-guided DCCV in 2/19 back to NSR. He has not been able tolerate even 1/2 tab tid of Bidil due to lightheadedness.   He went into atrial fibrillation in 2/22 with increased RV pacing and worsening CHF.  He had DCCV back to NSR in 3/22. However, it appears that a few days after DCCV, he went back into atrial fibrillation.  In 4/22, he had TEE-guided DCCV.  TEE in 4/22 showed EF < 20%, no thrombus, moderately decreased RV systolic function, moderate RV enlargement, moderate TR, functionally bicuspid aortic valve with mild AI (no AS).   Today he returns for HF follow up. Overall feeling fine. Very poor balance due to severe diabetic peripheral neuropathy.  He walks very little.  Uses wheelchair mostly.  Uses walker for very short distances.  He is not particularly short of breath. He denies CP, dizziness, edema, or PND/Orthopnea. Appetite fair. No fever or chills. Weight  at home 196 pounds.  Planning on Bi-V upgrade in September with Dr. Lovena Le. MD at [facility] took him off spironolactone 2 days ago.  Medtronic device (Personally reviewed): Optivol with fluid index below threshold, stable thoracic impedence, no recent atrial fibrillation, no VT/VF, <1 hr daily activity.  ECG : none ordered today.   Labs (9/11): LDL 61, HDL 26, K 4.8, creatinine 1.6  Labs (10/11): K 4.3, creatinine 1.3, BNP 165  Labs (11/11): K 4.9, creatinine 1.6  Labs (12/11): K 4.8, creatinine 1.7  Labs (2/12): LDL 110, HDL 28  Labs (3/12): K 4.1, creatinine 1.7 Labs (6/12): K 4.4, creatinine 1.4, BNP 125 Labs (10/12): K 4.8, creatinine 1.4 Labs (12/12): K 4.4, creatinine 1.5, BNP 20 Labs (3/13): K 5, creatinine 1.5, LDL 43, HDL 31 Labs (8/13): K 4.2, creatinine 1.3, BNP 158 Labs (05/03/14): K 4.6 Creatinine 1.4  Labs (9/16): K 4.4, creatinine 1.5 Labs (5/18): K 4.7, creatinine 1.63, pro-BNP 5249, hgb 14.4 Labs (12/18): K 4.5, creatinine 1.99, LDL 127, HDL 28, hgb 16.1 Labs (2/19): K 4.7, creatinine 2.46, LDL 78, HDL 26 Labs (11/20): K 4.9, creatinine 1.95 Labs (3/22): K 4.7, creatinine 2.9, BNP 2121 Labs (4/22): myeloma panel negative, K 4.4, creatinine 1.93 Labs (6/22): K 4.7, creatinine 2.15, LDL 55, HDL 31  Allergies:  1) ! Erythromycin   Past Medical History:  1. CAD: LHC (5/11) with 50-60% mid LAD, diffuse moderate distal LAD up to 60-70%, small OM2 subtotally occluded, moderate to severe diffuse distal PLOM disease, severe diffuse distal RCA, PDA, and PLV  disease. Coronaries were ectatic with slow flow. Patient was started on Plavix given concern for thrombus formation in the ectatic coronaries. He stopped Plavix after starting coumadin with atrial flutter.  2. Chronic systolic CHF: Likely mixed ischemic/nonischemic. Echo (5/11) with EF 15-20%, severe global hypokinesis, mild MR, moderate diastolic dysfunction, mildly decreased RV systolic function. RHC (5/11) with mean RA 14 mmHg,  PA 38/26, mean PCWP 25 mmHg, CI 1.8. Repeat echo (8/11) with EF 0000000, severe diastolic dysfunction, mild MR, RV normal size and systolic function. Per official read, cannot rule out apical clot but it does appear to be only trabeculations. TEE (9/11): EF 15% with global hypokinesis, mild to moderate MR, no LV thrombus and no LAA thrombus.  Echo (10/12): EF 25%.  Echo (1/17) with EF 20-25%, severe LV dilation, mild aortic stenosis with AVA 1.5 cm^2, moderate MR, moderately dilated RV with moderately decreased RV systolic function, PASP 50 mmHg. Medtronic ICD.  - Echo (1/19): EF 25%, severe LV dilation, mild RV dilation with severely decreased systolic function, mild-moderate MR.  - TEE (2/19): EF 25%, moderately dilated LV, mildly dilated RV with moderately decreased systolic function, moderate LAE/RAE, moderate TR, peak RV-RA gradient 56 mmHg, functionally bicuspid aortic valve without aortic stenosis.  - TEE (4/22): EF < 20%, no thrombus, moderately decreased RV systolic function, moderate RV enlargement, moderate TR, functionally bicuspid aortic valve with mild AI (no AS). 3. Type II diabetes 4. HTN  5. Smoker  6. Severe diabetic neuropathy with chronic gait dysfunction  7. History of left foot ulcer with poor healing  8. Atrial flutter s/p DCCV 9/11  9. Aortic stenosis: Mild by echo in 1/17. 10. Atrial fibrillation: Paroxysmal.  - DCCV to NSR in 2/19.  - DCCV to NSR in 4/22.  11. CKD: Stage 3.  Suspect diabetic nephropathy.  12. Functionally bicuspid aortic valve.   Family History:  Family History of Colon CA 1st degree relative <60 9other relative)  Heart disease (grandparent)  Stroke (other relative)  dm only in secondary relatives   Social History:  Pt lives in [place]. 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    Current Outpatient  Medications  Medication Sig Dispense Refill   amiodarone (PACERONE) 200 MG tablet Take 1 tablet (200 mg total) by mouth daily. 90 tablet 3   apixaban (ELIQUIS) 5 MG TABS tablet Take 1 tablet (5 mg total) by mouth 2 (two) times daily. 60 tablet 6   atorvastatin (LIPITOR) 80 MG tablet Take 80 mg by mouth daily.     carvedilol (COREG) 6.25 MG tablet Take 2 tablets (12.5 mg total) by mouth 2 (two) times daily with a meal. 360 tablet 1   dapagliflozin propanediol (FARXIGA) 10 MG TABS tablet Take 1 tablet (10 mg total) by mouth daily before breakfast. 30 tablet 11   ezetimibe (ZETIA) 10 MG tablet Take 1 tablet (10 mg total) by mouth daily. 90 tablet 3   glipiZIDE (GLUCOTROL XL) 10 MG 24 hr tablet Take 1-2 tablets (10-20 mg total) by mouth daily with breakfast. Take 2 tablets if blood sugar > 200. 180 tablet 3   isosorbide mononitrate (IMDUR) 30 MG 24 hr tablet Take 1/2 (one-half) tablet by mouth once daily 45 tablet 3   Lancets (ACCU-CHEK SOFT TOUCH) lancets Use to test blood sugar once a day E11.9 100 each 3   torsemide (  DEMADEX) 20 MG tablet Take 1 tablet (20 mg total) by mouth every other day. 90 tablet 3   No current facility-administered medications for this encounter.   Wt Readings from Last 3 Encounters:  02/11/21 89.5 kg (197 lb 6.4 oz)  02/03/21 90.4 kg (199 lb 6.4 oz)  12/12/20 89.3 kg (196 lb 12.8 oz)    BP 100/70   Pulse 70   Wt 89.5 kg (197 lb 6.4 oz)   SpO2 94%   BMI 24.67 kg/m  General:  NAD. No resp difficulty, thin, arrived in [facility] HEENT: Normal Neck: Supple. No JVD. Carotids 2+ bilat; no bruits. No lymphadenopathy or thryomegaly appreciated. Cor: PMI nondisplaced. Regular rate & rhythm. No rubs, gallops or murmurs. Lungs: Clear Abdomen: Soft, nontender, nondistended. No hepatosplenomegaly. No bruits or masses. Good bowel sounds. Extremities: No cyanosis, clubbing, rash, edema Neuro: Alert & oriented x 3, cranial nerves grossly intact. Moves all 4 extremities w/o  difficulty. Affect pleasant.  Assessment/Plan:  1. Atrial fibrillation:  Prior atrial flutter in 2011.  He was noted to be in atrial fibrillation in 2/19 and underwent TEE-guided DCCV to NSR.  DCCV again in 3/22 to NSR. TEE-guided DCCV again in 4/22.   He does not tolerate AF well with increased RV pacing percentage and worsening HF.  He is regular on exam today, no recent atrial fibrillation on device interrogation. - Continue Eliquis 5 mg bid.  - He has seen EP, not a good ablation candidate.  - Continue amiodarone 200 mg daily.  LFT/TSH ok 6/22, will need regular eye exam.   2. CKD stage 3: Suspect diabetic nephropathy.  - BMET today.   - Has been referred to nephrology. He tells me he has not received call from them. We will reach back out to referral. 3. Chronic systolic heart failure: Mixed ischemic/nonischemic cardiomyopathy. Has Medtronic ICD.  Last echo in 1/19 with EF 25%, severely dilated LV, severely decreased RV systolic function. He has significant distal vessel CAD on prior cath as well as a history of poorly controlled diabetes. He has NYHA class III symptoms (worse in AF) though not very active due to severe peripheral neuropathy. Today, he is not volume overloaded by exam or Optivol.  He has had a hard time tolerating medications due to orthostatic symptoms and also with baseline CKD stage 3. Symptoms are worse when he is in atrial fibrillation.  Most recent creatinine 1.93.   - Continue Coreg 12.5 mg bid.  - He was taken off spiro 2 days ago by MD at [facility], presumably for elevated SCr. - Continue Farxiga 10 mg daily. - Continue torsemide 20 mg every other day. He was unable to tolerate even low dose losartan and creatinine has been elevated. Will not retry ARB or ARNI at this time.  - Unable to tolerate Bidil 1/2 tab tid due to lightheadedness.  - He is scheduled for CRT upgrade next month w/ Dr. Mordecai Maes.   4. CAD: Diffuse moderate to severe distal vessel disease on last cath with  ectatic coronaries with slow flow. No interventional target.  No ischemic chest pain.  - Continue ASA 81 daily.  - Continue atorvastatin and Zetia, lipids/LFTs 6/22 ok  5. Smoking: Smoking 5 cigarettes a day. I strongly encouraged him to quit smoking.   Follow up with Dr. Aundra Dubin in 2 months.  [place] FNP 02/11/2021

## 2021-02-11 ENCOUNTER — Encounter (HOSPITAL_COMMUNITY): Payer: Self-pay

## 2021-02-11 ENCOUNTER — Other Ambulatory Visit: Payer: Self-pay

## 2021-02-11 ENCOUNTER — Ambulatory Visit (HOSPITAL_COMMUNITY)
Admission: RE | Admit: 2021-02-11 | Discharge: 2021-02-11 | Disposition: A | Discharge status: Home / Self Care | Payer: Medicare Other | Source: Ambulatory Visit | Attending: Family Medicine | Admitting: Family Medicine

## 2021-02-11 VITALS — BP 100/70 | HR 70 | Wt 197.4 lb

## 2021-02-11 DIAGNOSIS — N183 Chronic kidney disease, stage 3 unspecified: Secondary | ICD-10-CM | POA: Diagnosis not present

## 2021-02-11 DIAGNOSIS — Z95810 Presence of automatic (implantable) cardiac defibrillator: Secondary | ICD-10-CM

## 2021-02-11 DIAGNOSIS — Z7901 Long term (current) use of anticoagulants: Secondary | ICD-10-CM | POA: Diagnosis not present

## 2021-02-11 DIAGNOSIS — IMO0001 Reserved for inherently not codable concepts without codable children: Secondary | ICD-10-CM

## 2021-02-11 DIAGNOSIS — F17210 Nicotine dependence, cigarettes, uncomplicated: Secondary | ICD-10-CM | POA: Insufficient documentation

## 2021-02-11 DIAGNOSIS — I428 Other cardiomyopathies: Secondary | ICD-10-CM | POA: Diagnosis not present

## 2021-02-11 DIAGNOSIS — I2510 Atherosclerotic heart disease of native coronary artery without angina pectoris: Secondary | ICD-10-CM

## 2021-02-11 DIAGNOSIS — I480 Paroxysmal atrial fibrillation: Secondary | ICD-10-CM | POA: Diagnosis not present

## 2021-02-11 DIAGNOSIS — I5022 Chronic systolic (congestive) heart failure: Secondary | ICD-10-CM

## 2021-02-11 DIAGNOSIS — E1140 Type 2 diabetes mellitus with diabetic neuropathy, unspecified: Secondary | ICD-10-CM | POA: Diagnosis not present

## 2021-02-11 DIAGNOSIS — I130 Hypertensive heart and chronic kidney disease with heart failure and stage 1 through stage 4 chronic kidney disease, or unspecified chronic kidney disease: Secondary | ICD-10-CM | POA: Diagnosis not present

## 2021-02-11 DIAGNOSIS — Z79899 Other long term (current) drug therapy: Secondary | ICD-10-CM | POA: Insufficient documentation

## 2021-02-11 DIAGNOSIS — E1122 Type 2 diabetes mellitus with diabetic chronic kidney disease: Secondary | ICD-10-CM | POA: Insufficient documentation

## 2021-02-11 DIAGNOSIS — F17200 Nicotine dependence, unspecified, uncomplicated: Secondary | ICD-10-CM

## 2021-02-11 DIAGNOSIS — I350 Nonrheumatic aortic (valve) stenosis: Secondary | ICD-10-CM | POA: Diagnosis not present

## 2021-02-11 DIAGNOSIS — Z7984 Long term (current) use of oral hypoglycemic drugs: Secondary | ICD-10-CM | POA: Insufficient documentation

## 2021-02-11 DIAGNOSIS — N1832 Chronic kidney disease, stage 3b: Secondary | ICD-10-CM | POA: Diagnosis not present

## 2021-02-11 DIAGNOSIS — Z8249 Family history of ischemic heart disease and other diseases of the circulatory system: Secondary | ICD-10-CM | POA: Insufficient documentation

## 2021-02-11 DIAGNOSIS — Z7982 Long term (current) use of aspirin: Secondary | ICD-10-CM | POA: Insufficient documentation

## 2021-02-11 LAB — BASIC METABOLIC PANEL
Anion gap: 9 (ref 5–15)
BUN: 52 mg/dL — ABNORMAL HIGH (ref 8–23)
CO2: 24 mmol/L (ref 22–32)
Calcium: 9.2 mg/dL (ref 8.9–10.3)
Chloride: 103 mmol/L (ref 98–111)
Creatinine, Ser: 2.5 mg/dL — ABNORMAL HIGH (ref 0.61–1.24)
GFR, Estimated: 27 mL/min — ABNORMAL LOW (ref >60)
Glucose, Bld: 111 mg/dL — ABNORMAL HIGH (ref 70–99)
Potassium: 4.2 mmol/L (ref 3.5–5.1)
Sodium: 136 mmol/L (ref 135–145)

## 2021-02-11 NOTE — Patient Instructions (Addendum)
Labs done today. We will contact you only if your labs are abnormal.  No medication changes were made. Please continue all current medications as prescribed.  Your physician recommends that you schedule a follow-up appointment in: 2 months with Dr. Aundra Dubin  If you have any questions or concerns before your next appointment please send [place] a message through [facility] or call our office at [phone].    TO LEAVE A MESSAGE FOR THE NURSE SELECT OPTION 2, PLEASE LEAVE A MESSAGE INCLUDING: YOUR NAME DATE OF BIRTH CALL BACK NUMBER REASON FOR CALL**this is important as we prioritize the call backs  YOU WILL RECEIVE A CALL BACK THE SAME DAY AS LONG AS YOU CALL BEFORE 4:00 PM   Do the following things EVERYDAY: Weigh yourself in the morning before breakfast. Write it down and keep it in a log. Take your medicines as prescribed Eat low salt foods--Limit salt (sodium) to 2000 mg per day.  Stay as active as you can everyday Limit all fluids for the day to less than 2 liters   At the [facility], you and your health needs are our priority. As part of our continuing mission to provide you with exceptional heart care, we have created designated Provider Care Teams. These Care Teams include your primary Cardiologist (physician) and Advanced Practice Providers (APPs- Physician Assistants and Nurse Practitioners) who all work together to provide you with the care you need, when you need it.   You may see any of the following providers on your designated Care Team at your next follow up: Dr Glori Bickers Dr Haynes Kerns, NP Lyda Jester, [place] Audry Riles, PharmD   Please be sure to bring in all your medications bottles to every appointment.

## 2021-02-14 ENCOUNTER — Telehealth (HOSPITAL_COMMUNITY): Payer: Self-pay | Admitting: *Deleted

## 2021-02-14 MED ORDER — EMPAGLIFLOZIN 10 MG PO TABS: 10.0000 mg | ORAL_TABLET | Freq: Every day | ORAL | 6 refills | Status: DC

## 2021-02-14 NOTE — Telephone Encounter (Signed)
Received fax from [place], pt does not have community care consult for cardiology on file so they can not fill RX for [place]. Wilder Glade is also not on their formulary as Vania Rea is. Pt will either have to get med from local pharmacy or RX and records will have to be sent to pt's PCP, Dr Edison Nasuti at the [facility] for him to approve and order med. RX and OV note faxed to him at [phone]

## 2021-02-18 ENCOUNTER — Telehealth (HOSPITAL_COMMUNITY): Payer: Self-pay

## 2021-02-18 NOTE — Telephone Encounter (Signed)
Referral to [facility] faxed successfully to [phone]

## 2021-02-20 ENCOUNTER — Ambulatory Visit: Payer: Medicare Other

## 2021-02-21 NOTE — Telephone Encounter (Signed)
Received fax from [facility] to let us know pt is sch for a new pt appt on 02/28/21 at 11 am

## 2021-02-27 NOTE — Progress Notes (Signed)
Remote ICD transmission.   

## 2021-03-07 ENCOUNTER — Other Ambulatory Visit: Payer: Medicare PPO | Admitting: *Deleted

## 2021-03-07 ENCOUNTER — Other Ambulatory Visit: Payer: Self-pay

## 2021-03-07 DIAGNOSIS — I10 Essential (primary) hypertension: Secondary | ICD-10-CM

## 2021-03-07 DIAGNOSIS — I5022 Chronic systolic (congestive) heart failure: Secondary | ICD-10-CM

## 2021-03-07 DIAGNOSIS — I483 Typical atrial flutter: Secondary | ICD-10-CM

## 2021-03-07 DIAGNOSIS — I2510 Atherosclerotic heart disease of native coronary artery without angina pectoris: Secondary | ICD-10-CM

## 2021-03-07 DIAGNOSIS — I480 Paroxysmal atrial fibrillation: Secondary | ICD-10-CM

## 2021-03-07 LAB — BASIC METABOLIC PANEL
BUN/Creatinine Ratio: 16 (ref 10–24)
BUN: 40 mg/dL — ABNORMAL HIGH (ref 8–27)
CO2: 23 mmol/L (ref 20–29)
Calcium: 9 mg/dL (ref 8.6–10.2)
Chloride: 101 mmol/L (ref 96–106)
Creatinine, Ser: 2.55 mg/dL — ABNORMAL HIGH (ref 0.76–1.27)
Glucose: 98 mg/dL (ref 65–99)
Potassium: 4.7 mmol/L (ref 3.5–5.2)
Sodium: 140 mmol/L (ref 134–144)
eGFR: 26 mL/min/{1.73_m2} — ABNORMAL LOW (ref >59)

## 2021-03-07 LAB — CBC WITH DIFFERENTIAL/PLATELET
Basophils Absolute: 0 10*3/uL (ref 0.0–0.2)
Basos: 1 %
EOS (ABSOLUTE): 0.1 10*3/uL (ref 0.0–0.4)
Eos: 1 %
Hematocrit: 47.3 % (ref 37.5–51.0)
Hemoglobin: 15.1 g/dL (ref 13.0–17.7)
Immature Grans (Abs): 0 10*3/uL (ref 0.0–0.1)
Immature Granulocytes: 0 %
Lymphocytes Absolute: 1.4 10*3/uL (ref 0.7–3.1)
Lymphs: 25 %
MCH: 30.1 pg (ref 26.6–33.0)
MCHC: 31.9 g/dL (ref 31.5–35.7)
MCV: 94 fL (ref 79–97)
Monocytes Absolute: 0.6 10*3/uL (ref 0.1–0.9)
Monocytes: 11 %
Neutrophils Absolute: 3.5 10*3/uL (ref 1.4–7.0)
Neutrophils: 62 %
Platelets: 156 10*3/uL (ref 150–450)
RBC: 5.02 x10E6/uL (ref 4.14–5.80)
RDW: 13.9 % (ref 11.6–15.4)
WBC: 5.6 10*3/uL (ref 3.4–10.8)

## 2021-03-11 ENCOUNTER — Telehealth: Payer: Self-pay | Admitting: Internal Medicine

## 2021-03-11 NOTE — Telephone Encounter (Signed)
Returned call to Pt.  Advised ok to use lidocaine only for procedure per Dr. Lovena Le.

## 2021-03-11 NOTE — Telephone Encounter (Signed)
   Pt is calling regarding his upcoming procedure, he wants to request if her can just have a local anesthesia because he doesn't want to put to sleep during the procedure

## 2021-03-11 NOTE — Progress Notes (Signed)
No ICM remote transmission received for 03/03/2021 and next ICM transmission scheduled for 04/28/2021.   Pt having device battery change 03/13/2021.

## 2021-03-12 NOTE — Pre-Procedure Instructions (Signed)
Attempted to call patient regarding procedure tomorrow.  No answer, voice mail box full unable to leave a message

## 2021-03-13 ENCOUNTER — Ambulatory Visit (HOSPITAL_COMMUNITY)
Admission: RE | Admit: 2021-03-13 | Discharge: 2021-03-13 | Disposition: A | Discharge status: Home / Self Care | Payer: Medicare PPO | Attending: Internal Medicine | Admitting: Internal Medicine

## 2021-03-13 ENCOUNTER — Ambulatory Visit (HOSPITAL_COMMUNITY): Payer: Medicare PPO

## 2021-03-13 ENCOUNTER — Other Ambulatory Visit: Payer: Self-pay

## 2021-03-13 ENCOUNTER — Encounter (HOSPITAL_COMMUNITY)
Admission: RE | Disposition: A | Discharge status: Home / Self Care | Payer: No Typology Code available for payment source | Source: Home / Self Care | Attending: Internal Medicine

## 2021-03-13 DIAGNOSIS — I255 Ischemic cardiomyopathy: Secondary | ICD-10-CM

## 2021-03-13 DIAGNOSIS — Z79899 Other long term (current) drug therapy: Secondary | ICD-10-CM | POA: Diagnosis not present

## 2021-03-13 DIAGNOSIS — I5022 Chronic systolic (congestive) heart failure: Secondary | ICD-10-CM | POA: Diagnosis not present

## 2021-03-13 DIAGNOSIS — N183 Chronic kidney disease, stage 3 unspecified: Secondary | ICD-10-CM | POA: Diagnosis not present

## 2021-03-13 DIAGNOSIS — I428 Other cardiomyopathies: Secondary | ICD-10-CM | POA: Diagnosis not present

## 2021-03-13 DIAGNOSIS — I4891 Unspecified atrial fibrillation: Secondary | ICD-10-CM | POA: Diagnosis not present

## 2021-03-13 DIAGNOSIS — I130 Hypertensive heart and chronic kidney disease with heart failure and stage 1 through stage 4 chronic kidney disease, or unspecified chronic kidney disease: Secondary | ICD-10-CM | POA: Diagnosis not present

## 2021-03-13 DIAGNOSIS — I4892 Unspecified atrial flutter: Secondary | ICD-10-CM | POA: Diagnosis not present

## 2021-03-13 DIAGNOSIS — Z4502 Encounter for adjustment and management of automatic implantable cardiac defibrillator: Secondary | ICD-10-CM | POA: Diagnosis present

## 2021-03-13 DIAGNOSIS — I447 Left bundle-branch block, unspecified: Secondary | ICD-10-CM | POA: Diagnosis not present

## 2021-03-13 DIAGNOSIS — I350 Nonrheumatic aortic (valve) stenosis: Secondary | ICD-10-CM | POA: Diagnosis not present

## 2021-03-13 DIAGNOSIS — Z95810 Presence of automatic (implantable) cardiac defibrillator: Secondary | ICD-10-CM

## 2021-03-13 DIAGNOSIS — Z7984 Long term (current) use of oral hypoglycemic drugs: Secondary | ICD-10-CM | POA: Diagnosis not present

## 2021-03-13 DIAGNOSIS — Z7901 Long term (current) use of anticoagulants: Secondary | ICD-10-CM | POA: Diagnosis not present

## 2021-03-13 DIAGNOSIS — I480 Paroxysmal atrial fibrillation: Secondary | ICD-10-CM | POA: Diagnosis not present

## 2021-03-13 DIAGNOSIS — E1122 Type 2 diabetes mellitus with diabetic chronic kidney disease: Secondary | ICD-10-CM | POA: Diagnosis not present

## 2021-03-13 HISTORY — PX: BIV UPGRADE: EP1202

## 2021-03-13 LAB — GLUCOSE, CAPILLARY: Glucose-Capillary: 105 mg/dL — ABNORMAL HIGH (ref 70–99)

## 2021-03-13 SURGERY — BIV UPGRADE

## 2021-03-13 MED ORDER — CEFAZOLIN SODIUM-DEXTROSE 2-4 GM/100ML-% IV SOLN
INTRAVENOUS | Status: AC
  Filled 2021-03-13: qty 100

## 2021-03-13 MED ORDER — CHLORHEXIDINE GLUCONATE 4 % EX LIQD: 4.0000 "application " | Freq: Once | CUTANEOUS | Status: DC

## 2021-03-13 MED ORDER — LIDOCAINE HCL (PF) 1 % IJ SOLN
INTRAMUSCULAR | Status: DC | PRN
  Administered 2021-03-13: 50 mL

## 2021-03-13 MED ORDER — CEFAZOLIN SODIUM-DEXTROSE 1-4 GM/50ML-% IV SOLN: 1.0000 g | Freq: Once | INTRAVENOUS | Status: AC

## 2021-03-13 MED ORDER — HEPARIN (PORCINE) IN NACL 1000-0.9 UT/500ML-% IV SOLN
INTRAVENOUS | Status: AC
  Filled 2021-03-13: qty 500

## 2021-03-13 MED ORDER — SODIUM CHLORIDE 0.9 % IV SOLN: INTRAVENOUS | Status: DC

## 2021-03-13 MED ORDER — MIDAZOLAM HCL 5 MG/5ML IJ SOLN
INTRAMUSCULAR | Status: AC
  Filled 2021-03-13: qty 5

## 2021-03-13 MED ORDER — CEFAZOLIN SODIUM-DEXTROSE 2-4 GM/100ML-% IV SOLN
2.0000 g | INTRAVENOUS | Status: AC
  Administered 2021-03-13: 2 g via INTRAVENOUS

## 2021-03-13 MED ORDER — ONDANSETRON HCL 4 MG/2ML IJ SOLN: 4.0000 mg | Freq: Four times a day (QID) | INTRAMUSCULAR | Status: DC | PRN

## 2021-03-13 MED ORDER — MIDAZOLAM HCL 5 MG/5ML IJ SOLN
INTRAMUSCULAR | Status: DC | PRN
  Administered 2021-03-13: 1 mg via INTRAVENOUS
  Administered 2021-03-13: 2 mg via INTRAVENOUS

## 2021-03-13 MED ORDER — CEFAZOLIN SODIUM-DEXTROSE 1-4 GM/50ML-% IV SOLN
INTRAVENOUS | Status: AC
  Administered 2021-03-13: 1000 mg
  Filled 2021-03-13: qty 50

## 2021-03-13 MED ORDER — SODIUM CHLORIDE 0.9 % IV SOLN
INTRAVENOUS | Status: AC
  Filled 2021-03-13: qty 2

## 2021-03-13 MED ORDER — LIDOCAINE HCL (PF) 1 % IJ SOLN
INTRAMUSCULAR | Status: AC
  Filled 2021-03-13: qty 60

## 2021-03-13 MED ORDER — FENTANYL CITRATE (PF) 100 MCG/2ML IJ SOLN
INTRAMUSCULAR | Status: DC | PRN
  Administered 2021-03-13: 12.5 ug via INTRAVENOUS
  Administered 2021-03-13: 25 ug via INTRAVENOUS

## 2021-03-13 MED ORDER — HEPARIN (PORCINE) IN NACL 1000-0.9 UT/500ML-% IV SOLN
INTRAVENOUS | Status: DC | PRN
  Administered 2021-03-13: 500 mL

## 2021-03-13 MED ORDER — POVIDONE-IODINE 10 % EX SWAB
2.0000 "application " | Freq: Once | CUTANEOUS | Status: AC
  Administered 2021-03-13: 2 via TOPICAL

## 2021-03-13 MED ORDER — CEFAZOLIN SODIUM-DEXTROSE 1-4 GM/50ML-% IV SOLN
INTRAVENOUS | Status: AC
  Administered 2021-03-13: 1 g via INTRAVENOUS
  Filled 2021-03-13: qty 50

## 2021-03-13 MED ORDER — IOHEXOL 350 MG/ML SOLN
INTRAVENOUS | Status: DC | PRN
  Administered 2021-03-13: 30 mL

## 2021-03-13 MED ORDER — FENTANYL CITRATE (PF) 100 MCG/2ML IJ SOLN
INTRAMUSCULAR | Status: AC
  Filled 2021-03-13: qty 2

## 2021-03-13 MED ORDER — SODIUM CHLORIDE 0.9 % IV SOLN
80.0000 mg | INTRAVENOUS | Status: AC
  Administered 2021-03-13: 80 mg

## 2021-03-13 MED ORDER — ACETAMINOPHEN 325 MG PO TABS: 325.0000 mg | ORAL_TABLET | ORAL | Status: DC | PRN

## 2021-03-13 SURGICAL SUPPLY — 20 items
CABLE SURGICAL S-101-97-12 (CABLE) ×2 IMPLANT
CATH ATTAIN COM SUR 6250V-MB2X (CATHETERS) ×1 IMPLANT
CATH ATTAIN COM SURV 6250V-EH (CATHETERS) ×1 IMPLANT
CATH ATTAIN COM SURV 6250V-MB2 (CATHETERS) ×1 IMPLANT
CATH CPS DIRECT XW DS2C029 (CATHETERS) ×1 IMPLANT
CATH HEX JOS 2-5-2 65CM 6F REP (CATHETERS) ×1 IMPLANT
CATH JOSEPH QUAD ALLRED 6F REP (CATHETERS) ×1 IMPLANT
CATH RIGHTSITE C315HIS02 (CATHETERS) ×1 IMPLANT
GUIDEWIRE ANGLED .035X150CM (WIRE) ×1 IMPLANT
ICD CLARIA MRI DTMA1D1 (ICD Generator) ×1 IMPLANT
LEAD SELECT SECURE 3830 383069 (Lead) IMPLANT
PAD PRO RADIOLUCENT 2001M-C (PAD) ×2 IMPLANT
SELECT SECURE 3830 383069 (Lead) ×2 IMPLANT
SHEATH 9.5FR PRELUDE SNAP 13 (SHEATH) ×1 IMPLANT
SHEATH 9FR PRELUDE SNAP 13 (SHEATH) ×1 IMPLANT
SHEATH WORLEY JUMBO 9FR 50CM (SHEATH) ×1 IMPLANT
SLITTER 6232ADJ (MISCELLANEOUS) ×1 IMPLANT
TRAY PACEMAKER INSERTION (PACKS) ×2 IMPLANT
WIRE ACUITY WHISPER EDS 4648 (WIRE) ×1 IMPLANT
WIRE HITORQ VERSACORE ST 145CM (WIRE) ×1 IMPLANT

## 2021-03-13 NOTE — H&P (Signed)
PCP: Vivi Barrack, MD Primary Cardiologist: Dr Aundra Dubin Primary EP: Dr Rayann Heman   Anthony Rubio. is a 70 y.o. male who presents today for routine electrophysiology followup.  Since last being seen in our clinic, the patient reports doing very well.  Today, he denies symptoms of palpitations, chest pain, shortness of breath,  lower extremity edema, dizziness, presyncope, syncope, or ICD shocks.  The patient is otherwise without complaint today.        Past Medical History:  Diagnosis Date   AICD (automatic cardioverter/defibrillator) present     Aortic stenosis      a. Mild by echo 04/2011.    Arthritis     Atrial flutter ([place])      s/p DCCV 9/11   CAD (coronary artery disease)      a. LHC (5/11) with 50-60% mid LAD, diffuse mod distal LAD up to 60-70%, small OM2 subtotally occ, mod-severe diffuse distal PLOM, severe diffuse distal RCA, PDA, and PLV  disease. Started on Plavix given concern for thrombus formation in the ectatic cors with slow flow. b. Stopped Plavix after starting Coumadin for a-flutter.   CHF (congestive heart failure) ([place])      a. Likely mixed ICM/NICM. Echo (5/11) with EF 15-20%, severe global HK, mild MR, mod d/d, mildly decreased RV fcn. RHC (5/11) with mean RA 14 mmHg, PA 38/26, mean PCWP 25 mmHg, CI 1.8.  b. TEE (9/11): EF 15% no LV thrombus, no LAA thrombus. c. s/p ICD 11/11. d. Last echo 04/2011: LV severely dilated, EF 25%, diffuse HK, mild AS, mildly dilated LA, trivial pericardial effusion.   CKD (chronic kidney disease), stage III (HCC)     Complication of anesthesia      Slow to awaken   Diabetic neuropathy (HCC)      severe; with chronic gait dysfunction   Dysrhythmia     Foot ulcer, left (HCC)      poor healing   GERD (gastroesophageal reflux disease)      H.pyloric   History of echocardiogram 07/2015    Echo 1/17: EF 20-25%, inf-lat AK, restrictive physio, mild AS (mean 9 mmHg), mild AI, mod MR, severe LAE, mod reduced RVSF, mild RAE, mild TR, PASP 50  mmHg   HTN (hypertension)     ICD (implantable cardiac defibrillator) in place 11/11    Medtronic dual cha,ber ICD with Optivol   Neuropathy associated with endocrine disorder ([place])      feet anad finger tips   Tobacco abuse     Uncontrolled type II diabetes mellitus ([place])           Past Surgical History:  Procedure Laterality Date   CARDIOVERSION N/A 08/23/2017    Procedure: CARDIOVERSION;  Surgeon: Larey Dresser, MD;  Location: [place];  Service: Cardiovascular;  Laterality: N/A;   CARDIOVERSION N/A 09/12/2020    Procedure: CARDIOVERSION;  Surgeon: Larey Dresser, MD;  Location: [place];  Service: Cardiovascular;  Laterality: N/A;  STAT INR!!!   CARDIOVERSION N/A 10/10/2020    Procedure: CARDIOVERSION;  Surgeon: Larey Dresser, MD;  Location: [place];  Service: Cardiovascular;  Laterality: N/A;   COLONOSCOPY       COLONOSCOPY WITH PROPOFOL N/A 12/01/2017    Procedure: COLONOSCOPY WITH PROPOFOL;  Surgeon: Arta Silence, MD;  Location: [facility] ENDOSCOPY;  Service: Endoscopy;  Laterality: N/A;   ESOPHAGOGASTRODUODENOSCOPY       ICD GENERATOR CHANGEOUT N/A 02/01/2018    Procedure: ICD GENERATOR CHANGEOUT;  Surgeon: Thompson Grayer, MD;  Location: [place] CV LAB;  Service: Cardiovascular;  Laterality: N/A;   ICD implantation   2011    by JA   TEE WITHOUT CARDIOVERSION N/A 08/23/2017    Procedure: TRANSESOPHAGEAL ECHOCARDIOGRAM (TEE);  Surgeon: Larey Dresser, MD;  Location: [facility] ENDOSCOPY;  Service: Cardiovascular;  Laterality: N/A;   TEE WITHOUT CARDIOVERSION N/A 10/10/2020    Procedure: TRANSESOPHAGEAL ECHOCARDIOGRAM (TEE);  Surgeon: Larey Dresser, MD;  Location: [facility] ENDOSCOPY;  Service: Cardiovascular;  Laterality: N/A;      ROS- all systems are reviewed and negative except as per HPI above         Current Outpatient Medications  Medication Sig Dispense Refill   amiodarone (PACERONE) 200 MG tablet Take 1 tablet (200 mg total) by mouth daily. 90 tablet 3   apixaban  (ELIQUIS) 5 MG TABS tablet Take 1 tablet (5 mg total) by mouth 2 (two) times daily. 60 tablet 6   atorvastatin (LIPITOR) 80 MG tablet Take 80 mg by mouth daily.       carvedilol (COREG) 6.25 MG tablet Take 2 tablets (12.5 mg total) by mouth 2 (two) times daily with a meal. 360 tablet 1   dapagliflozin propanediol (FARXIGA) 10 MG TABS tablet Take 1 tablet (10 mg total) by mouth daily before breakfast. 30 tablet 11   ezetimibe (ZETIA) 10 MG tablet Take 1 tablet (10 mg total) by mouth daily. 90 tablet 3   glipiZIDE (GLUCOTROL XL) 10 MG 24 hr tablet Take 1-2 tablets (10-20 mg total) by mouth daily with breakfast. Take 2 tablets if blood sugar > 200. 180 tablet 3   isosorbide mononitrate (IMDUR) 30 MG 24 hr tablet Take 1/2 (one-half) tablet by mouth once daily 45 tablet 3   Lancets (ACCU-CHEK SOFT TOUCH) lancets Use to test blood sugar once a day E11.9 100 each 3   spironolactone (ALDACTONE) 25 MG tablet TAKE 1 TABLET BY MOUTH ONCE DAILY 90 tablet 3   torsemide (DEMADEX) 20 MG tablet Take 1 tablet (20 mg total) by mouth every other day. 90 tablet 3    No current facility-administered medications for this visit.      Physical Exam: There were no vitals filed for this visit.   GEN- The patient is well appearing, alert and oriented x 3 today.   Head- normocephalic, atraumatic Eyes-  Sclera clear, conjunctiva pink Ears- hearing intact Oropharynx- clear Lungs- Clear to ausculation bilaterally, normal work of breathing Chest- ICD pocket is well healed Heart- Regular rate and rhythm, no murmurs, rubs or gallops, PMI not laterally displaced GI- soft, NT, ND, + BS Extremities- no clubbing, cyanosis, or edema   ICD interrogation- reviewed in detail today,  See PACEART report   ekg tracing ordered today is personally reviewed and shows atrial paced, first degree AV block, IVCD with QRS 160 msec      Wt Readings from Last 3 Encounters:  12/12/20 196 lb 12.8 oz (89.3 kg)  11/27/20 201 lb 3.2 oz  (91.3 kg)  11/26/20 202 lb (91.6 kg)      Assessment and Plan:   1.  Chronic systolic dysfunction/ mixed ischemic and nonischemic CM euvolemic today Stable on an appropriate medical regimen Normal ICD function EF <20% See [facility] report No changes today he is not device dependant today followed in ICM device clinic He has a long first degree AV block and QRS > 150 msec.  I have advised CRT upgrade.  I agree with Dr Oleh Genin thoughts on this. Risks, benefits, and alternatives  to BiV ICD upgrade were discussed in detail today with patient and spouse.  The patient understands that risks include but are not limited to bleeding, infection, pneumothorax, perforation, tamponade, vascular damage, renal failure, MI, stroke, death, inappropriate shocks, damage to his existing leads, and lead dislodgement and wishes to proceed.  We will therefore schedule the procedure at the next available time.  Given limitations of scheduling, I will ask Dr Lovena Le to assist with the procedure at the next available time. Hold eliquis 24 hours prior to the procedure.   2. HTN Continue coreg 6.'25mg'$  BID   3. Afib/ atrial flutter Doing well with eliquis He is on amiodarone He has severe LA enlargement and is a poor candidate for ablation   Risks, benefits and potential toxicities for medications prescribed and/or refilled reviewed with patient today.    Thompson Grayer MD, [facility] 02/03/2021 8:35 AM  EP Attending  Patient seen and examined. Agree with above. The patient presents today for Biv ICD upgrade. I have reviewed the findings with the patient and recommended proceeding with insertion of a new LV lead and removal of the old device and insertion of a new device and he wishes to proceed. The risks/benefits/goals/expectations of the procedure were reviewed.   Carleene Overlie Oskar Cretella,MD

## 2021-03-13 NOTE — Progress Notes (Signed)
Right forearm IV infiltrated after giving IV ancef. Heat and manual pressure applied to site. Dr. Lovena Le notified and asked RN to give another bag of ancef. New IV started and additional bag of ancef given. Pt denies any pain at the site and VSS.

## 2021-03-13 NOTE — Discharge Instructions (Signed)
    Supplemental Discharge Instructions for  Pacemaker/Defibrillator Patients  Tomorrow, 03/14/21, send in a device transmission  Activity No heavy lifting or vigorous activity with your left/right arm for 6 to 8 weeks.  Do not raise your left/right arm above your head for one week.  Gradually raise your affected arm as drawn below.             03/18/21                     03/19/21                   03/20/21                   03/21/21 __  NO DRIVING for  1 week   ; you may begin driving on   [id number]  .  WOUND CARE Keep the wound area clean and dry.  Do not get this area wet , no showers for one week; you may shower on   03/21/21  . Tomorrow, 03/14/21, remove the arm sling Tomorrow, 03/14/21 remove the outer plastic bandage.  Underneath the plastic bandage there are steri strips (paper tapes), DO NOT remove these. The tape/steri-strips on your wound will fall off; do not pull them off.  No bandage is needed on the site.  DO  NOT apply any creams, oils, or ointments to the wound area. If you notice any drainage or discharge from the wound, any swelling or bruising at the site, or you develop a fever > 101? F after you are discharged home, call the office at once.  Special Instructions You are still able to use cellular telephones; use the ear opposite the side where you have your pacemaker/defibrillator.  Avoid carrying your cellular phone near your device. When traveling through airports, show security personnel your identification card to avoid being screened in the metal detectors.  Ask the security personnel to use the hand wand. Avoid arc welding equipment, MRI testing (magnetic resonance imaging), TENS units (transcutaneous nerve stimulators).  Call the office for questions about other devices. Avoid electrical appliances that are in poor condition or are not properly grounded. Microwave ovens are safe to be near or to operate.  Additional information for defibrillator patients should your  device go off: If your device goes off ONCE and you feel fine afterward, notify the device clinic nurses. If your device goes off ONCE and you do not feel well afterward, call 911. If your device goes off TWICE, call 911. If your device goes off THREE times in one day, call 911.  DO NOT DRIVE YOURSELF OR A FAMILY MEMBER WITH A DEFIBRILLATOR TO THE HOSPITAL--CALL 911.

## 2021-03-14 ENCOUNTER — Telehealth: Payer: Self-pay | Admitting: Internal Medicine

## 2021-03-14 ENCOUNTER — Telehealth: Payer: Self-pay

## 2021-03-14 ENCOUNTER — Encounter (HOSPITAL_COMMUNITY): Payer: Self-pay | Admitting: Internal Medicine

## 2021-03-14 NOTE — Telephone Encounter (Signed)
Returned call to Pt.  Pt was confused-his discharge paperwork had 2 different times for his wound check appt on 03/26/21.  Advised his current time is 9/21 at 2:40 pm.  Per Pt that is too late in the day.  Rescheduled Pt for 03/27/2021 at 9:20 am.  Pt thanked nurse for assistance.

## 2021-03-14 NOTE — Telephone Encounter (Signed)
-----   Message from [place], [place] sent at 03/13/2021  4:54 PM EDT ----- Same day d/c  GT MDT CRT upgrade

## 2021-03-14 NOTE — Telephone Encounter (Signed)
Follow-up after same day discharge: Implant date: 03/13/21 MD: Cristopher Peru, MD Device: MDT CRT-D Location: Left chest   Wound check visit: 03/27/21 9:20 am  90 day MD follow-up: 06/25/21  Remote Transmission received:No- Carelink updated today, patient will send manual transmission today, we need to verify receipt on 9/12  Dressing removed: Not yet, pt to remove today.  Wound care and activity restrictions reviewed.

## 2021-03-14 NOTE — Telephone Encounter (Signed)
Pt states that he has two appts on 9/21 '@8'$ :40 and 9/21 '@11'$ :45 for a wound check. Edu pt that he is only scheduled for one appt which is 9/21 '@2'$ :40pm no other appts listed. Pt would like to speak w/ nurse [name] to clarify... please advise.

## 2021-03-17 NOTE — Telephone Encounter (Signed)
Manual transmission received 03/16/21.

## 2021-03-26 ENCOUNTER — Ambulatory Visit: Payer: Medicare Other

## 2021-03-27 ENCOUNTER — Other Ambulatory Visit: Payer: Self-pay

## 2021-03-27 ENCOUNTER — Ambulatory Visit (INDEPENDENT_AMBULATORY_CARE_PROVIDER_SITE_OTHER): Payer: Medicare PPO

## 2021-03-27 DIAGNOSIS — I429 Cardiomyopathy, unspecified: Secondary | ICD-10-CM

## 2021-03-27 NOTE — Patient Instructions (Signed)
   After Your ICD (Implantable Cardiac Defibrillator)    Monitor your defibrillator site for redness, swelling, and drainage. Call the device clinic at [phone] if you experience these symptoms or fever/chills.  Your incision was closed with Steri-strips or staples:  You may shower 7 days after your procedure and wash your incision with soap and water. Avoid lotions, ointments, or perfumes over your incision until it is well-healed.    You may use a hot tub or a pool after your wound check appointment if the incision is completely closed.  Do not lift, push or pull greater than 10 pounds with the affected arm until 6 weeks after your procedure. There are no other restrictions in arm movement after your wound check appointment.  Your ICD is MRI compatible.  Your ICD is designed to protect you from life threatening heart rhythms. Because of this, you may receive a shock.   1 shock with no symptoms:  Call the office during business hours. 1 shock with symptoms (chest pain, chest pressure, dizziness, lightheadedness, shortness of breath, overall feeling unwell):  Call 911. If you experience 2 or more shocks in 24 hours:  Call 911. If you receive a shock, you should not drive.  [place] DMV - no driving for 6 months if you receive appropriate therapy from your ICD.   ICD Alerts:  Some alerts are vibratory and others beep. These are NOT emergencies. Please call our office to let us know. If this occurs at night or on weekends, it can wait until the next business day. Send a remote transmission.  If your device is capable of reading fluid status (for heart failure), you will be offered monthly monitoring to review this with you.   Remote monitoring is used to monitor your ICD from home. This monitoring is scheduled every 91 days by our office. It allows us to keep an eye on the functioning of your device to ensure it is working properly. You will routinely see your Electrophysiologist annually  (more often if necessary).   

## 2021-03-28 LAB — CUP PACEART INCLINIC DEVICE CHECK
Battery Remaining Longevity: 81 mo
Battery Voltage: 3.06 V
Brady Statistic AP VP Percent: 99.45 %
Brady Statistic AP VS Percent: 0.11 %
Brady Statistic AS VP Percent: 0.44 %
Brady Statistic AS VS Percent: 0 %
Brady Statistic RA Percent Paced: 99.34 %
Brady Statistic RV Percent Paced: 99.78 %
Date Time Interrogation Session: 20220922094800
HighPow Impedance: 38 Ohm
HighPow Impedance: 46 Ohm
Implantable Lead Implant Date: 20111108
Implantable Lead Implant Date: 20111108
Implantable Lead Implant Date: 20220908
Implantable Lead Location: 753858
Implantable Lead Location: 753859
Implantable Lead Location: 753860
Implantable Lead Model: 3830
Implantable Lead Model: 5076
Implantable Lead Model: 6947
Implantable Pulse Generator Implant Date: 20220908
Lead Channel Impedance Value: 247 Ohm
Lead Channel Impedance Value: 266 Ohm
Lead Channel Impedance Value: 342 Ohm
Lead Channel Impedance Value: 342 Ohm
Lead Channel Impedance Value: 437 Ohm
Lead Channel Impedance Value: 532 Ohm
Lead Channel Pacing Threshold Amplitude: 0.75 V
Lead Channel Pacing Threshold Amplitude: 0.75 V
Lead Channel Pacing Threshold Amplitude: 1 V
Lead Channel Pacing Threshold Pulse Width: 0.4 ms
Lead Channel Pacing Threshold Pulse Width: 0.4 ms
Lead Channel Pacing Threshold Pulse Width: 0.4 ms
Lead Channel Sensing Intrinsic Amplitude: 2.625 mV
Lead Channel Sensing Intrinsic Amplitude: 4.625 mV
Lead Channel Setting Pacing Amplitude: 2 V
Lead Channel Setting Pacing Amplitude: 2 V
Lead Channel Setting Pacing Amplitude: 2.25 V
Lead Channel Setting Pacing Pulse Width: 0.4 ms
Lead Channel Setting Pacing Pulse Width: 0.4 ms
Lead Channel Setting Sensing Sensitivity: 0.3 mV

## 2021-03-28 NOTE — Progress Notes (Signed)
Wound check appointment s/p ICD gen change/upgrade to Bi-V with new LV lead implant. Steri-strips removed. Wound without redness or edema. Incision edges approximated, wound well healed. Normal device function. Thresholds, sensing, and impedances consistent with implant measurements. Histogram distribution appropriate for patient and level of activity. No mode switches or ventricular arrhythmias noted. Patient educated about wound care, arm mobility, lifting restrictions, shock plan. Patient enrolled in remote monitoring with next transmission scheduled 04/28/21. 91 day follow up with Dr. Lovena Le 06/25/21. Of note patient states he has not felt well post gen change. No abnormalities on device. Patient is enrolled in [facility] clinic and active with the HF team. States he has felt extremely weak recently. Encouraged patient to check BP at home and discuss with HF team. BP today 100/67.

## 2021-04-08 ENCOUNTER — Encounter (HOSPITAL_COMMUNITY): Payer: Self-pay | Admitting: Emergency Medicine

## 2021-04-08 ENCOUNTER — Inpatient Hospital Stay (HOSPITAL_COMMUNITY)
Admission: EM | Admit: 2021-04-08 | Discharge: 2021-04-17 | DRG: 871 | Disposition: A | Discharge status: Home Health | Payer: No Typology Code available for payment source | Attending: Family Medicine | Admitting: Family Medicine

## 2021-04-08 ENCOUNTER — Emergency Department (HOSPITAL_COMMUNITY): Payer: No Typology Code available for payment source

## 2021-04-08 ENCOUNTER — Encounter (HOSPITAL_COMMUNITY): Payer: Self-pay | Admitting: Certified Registered"

## 2021-04-08 ENCOUNTER — Inpatient Hospital Stay (HOSPITAL_COMMUNITY): Payer: No Typology Code available for payment source

## 2021-04-08 ENCOUNTER — Other Ambulatory Visit: Payer: Self-pay

## 2021-04-08 ENCOUNTER — Encounter (HOSPITAL_COMMUNITY)
Admission: EM | Disposition: A | Discharge status: Home Health | Payer: Self-pay | Source: Home / Self Care | Attending: Internal Medicine

## 2021-04-08 DIAGNOSIS — Z79899 Other long term (current) drug therapy: Secondary | ICD-10-CM

## 2021-04-08 DIAGNOSIS — K252 Acute gastric ulcer with both hemorrhage and perforation: Secondary | ICD-10-CM | POA: Diagnosis not present

## 2021-04-08 DIAGNOSIS — Z95810 Presence of automatic (implantable) cardiac defibrillator: Secondary | ICD-10-CM | POA: Diagnosis not present

## 2021-04-08 DIAGNOSIS — Z7901 Long term (current) use of anticoagulants: Secondary | ICD-10-CM

## 2021-04-08 DIAGNOSIS — I350 Nonrheumatic aortic (valve) stenosis: Secondary | ICD-10-CM | POA: Diagnosis present

## 2021-04-08 DIAGNOSIS — K219 Gastro-esophageal reflux disease without esophagitis: Secondary | ICD-10-CM | POA: Diagnosis present

## 2021-04-08 DIAGNOSIS — R579 Shock, unspecified: Secondary | ICD-10-CM | POA: Insufficient documentation

## 2021-04-08 DIAGNOSIS — I255 Ischemic cardiomyopathy: Secondary | ICD-10-CM | POA: Diagnosis present

## 2021-04-08 DIAGNOSIS — R778 Other specified abnormalities of plasma proteins: Secondary | ICD-10-CM

## 2021-04-08 DIAGNOSIS — I5043 Acute on chronic combined systolic (congestive) and diastolic (congestive) heart failure: Secondary | ICD-10-CM | POA: Diagnosis not present

## 2021-04-08 DIAGNOSIS — N184 Chronic kidney disease, stage 4 (severe): Secondary | ICD-10-CM | POA: Diagnosis not present

## 2021-04-08 DIAGNOSIS — I5023 Acute on chronic systolic (congestive) heart failure: Secondary | ICD-10-CM | POA: Diagnosis not present

## 2021-04-08 DIAGNOSIS — J810 Acute pulmonary edema: Secondary | ICD-10-CM | POA: Diagnosis not present

## 2021-04-08 DIAGNOSIS — E11610 Type 2 diabetes mellitus with diabetic neuropathic arthropathy: Secondary | ICD-10-CM | POA: Diagnosis present

## 2021-04-08 DIAGNOSIS — E1122 Type 2 diabetes mellitus with diabetic chronic kidney disease: Secondary | ICD-10-CM | POA: Diagnosis present

## 2021-04-08 DIAGNOSIS — R570 Cardiogenic shock: Secondary | ICD-10-CM | POA: Diagnosis not present

## 2021-04-08 DIAGNOSIS — Z888 Allergy status to other drugs, medicaments and biological substances status: Secondary | ICD-10-CM

## 2021-04-08 DIAGNOSIS — J9811 Atelectasis: Secondary | ICD-10-CM | POA: Diagnosis present

## 2021-04-08 DIAGNOSIS — Z7984 Long term (current) use of oral hypoglycemic drugs: Secondary | ICD-10-CM

## 2021-04-08 DIAGNOSIS — F17210 Nicotine dependence, cigarettes, uncomplicated: Secondary | ICD-10-CM | POA: Diagnosis present

## 2021-04-08 DIAGNOSIS — E860 Dehydration: Secondary | ICD-10-CM | POA: Insufficient documentation

## 2021-04-08 DIAGNOSIS — Z881 Allergy status to other antibiotic agents status: Secondary | ICD-10-CM

## 2021-04-08 DIAGNOSIS — Z8249 Family history of ischemic heart disease and other diseases of the circulatory system: Secondary | ICD-10-CM

## 2021-04-08 DIAGNOSIS — E1140 Type 2 diabetes mellitus with diabetic neuropathy, unspecified: Secondary | ICD-10-CM | POA: Diagnosis present

## 2021-04-08 DIAGNOSIS — R6521 Severe sepsis with septic shock: Secondary | ICD-10-CM | POA: Diagnosis not present

## 2021-04-08 DIAGNOSIS — L97529 Non-pressure chronic ulcer of other part of left foot with unspecified severity: Secondary | ICD-10-CM | POA: Diagnosis present

## 2021-04-08 DIAGNOSIS — E11621 Type 2 diabetes mellitus with foot ulcer: Secondary | ICD-10-CM | POA: Diagnosis present

## 2021-04-08 DIAGNOSIS — A419 Sepsis, unspecified organism: Secondary | ICD-10-CM | POA: Diagnosis not present

## 2021-04-08 DIAGNOSIS — J690 Pneumonitis due to inhalation of food and vomit: Secondary | ICD-10-CM

## 2021-04-08 DIAGNOSIS — R197 Diarrhea, unspecified: Secondary | ICD-10-CM

## 2021-04-08 DIAGNOSIS — Z20822 Contact with and (suspected) exposure to covid-19: Secondary | ICD-10-CM | POA: Diagnosis present

## 2021-04-08 DIAGNOSIS — I509 Heart failure, unspecified: Secondary | ICD-10-CM | POA: Diagnosis not present

## 2021-04-08 DIAGNOSIS — N179 Acute kidney failure, unspecified: Secondary | ICD-10-CM | POA: Diagnosis not present

## 2021-04-08 DIAGNOSIS — I428 Other cardiomyopathies: Secondary | ICD-10-CM | POA: Diagnosis present

## 2021-04-08 DIAGNOSIS — E870 Hyperosmolality and hypernatremia: Secondary | ICD-10-CM | POA: Diagnosis present

## 2021-04-08 DIAGNOSIS — I130 Hypertensive heart and chronic kidney disease with heart failure and stage 1 through stage 4 chronic kidney disease, or unspecified chronic kidney disease: Secondary | ICD-10-CM | POA: Diagnosis present

## 2021-04-08 DIAGNOSIS — I480 Paroxysmal atrial fibrillation: Secondary | ICD-10-CM | POA: Diagnosis present

## 2021-04-08 DIAGNOSIS — I2510 Atherosclerotic heart disease of native coronary artery without angina pectoris: Secondary | ICD-10-CM | POA: Diagnosis present

## 2021-04-08 DIAGNOSIS — I4892 Unspecified atrial flutter: Secondary | ICD-10-CM | POA: Diagnosis present

## 2021-04-08 DIAGNOSIS — Z452 Encounter for adjustment and management of vascular access device: Secondary | ICD-10-CM

## 2021-04-08 DIAGNOSIS — I5082 Biventricular heart failure: Secondary | ICD-10-CM | POA: Diagnosis present

## 2021-04-08 DIAGNOSIS — R112 Nausea with vomiting, unspecified: Secondary | ICD-10-CM | POA: Insufficient documentation

## 2021-04-08 DIAGNOSIS — I071 Rheumatic tricuspid insufficiency: Secondary | ICD-10-CM | POA: Diagnosis present

## 2021-04-08 DIAGNOSIS — R6520 Severe sepsis without septic shock: Secondary | ICD-10-CM

## 2021-04-08 DIAGNOSIS — J9601 Acute respiratory failure with hypoxia: Secondary | ICD-10-CM | POA: Diagnosis not present

## 2021-04-08 DIAGNOSIS — E785 Hyperlipidemia, unspecified: Secondary | ICD-10-CM | POA: Diagnosis present

## 2021-04-08 DIAGNOSIS — K631 Perforation of intestine (nontraumatic): Secondary | ICD-10-CM | POA: Diagnosis present

## 2021-04-08 DIAGNOSIS — K255 Chronic or unspecified gastric ulcer with perforation: Secondary | ICD-10-CM | POA: Diagnosis not present

## 2021-04-08 DIAGNOSIS — I5022 Chronic systolic (congestive) heart failure: Secondary | ICD-10-CM | POA: Diagnosis not present

## 2021-04-08 DIAGNOSIS — Z833 Family history of diabetes mellitus: Secondary | ICD-10-CM

## 2021-04-08 DIAGNOSIS — K668 Other specified disorders of peritoneum: Secondary | ICD-10-CM

## 2021-04-08 DIAGNOSIS — R1115 Cyclical vomiting syndrome unrelated to migraine: Secondary | ICD-10-CM | POA: Diagnosis present

## 2021-04-08 LAB — CBC WITH DIFFERENTIAL/PLATELET
Abs Immature Granulocytes: 0.03 10*3/uL (ref 0.00–0.07)
Basophils Absolute: 0 10*3/uL (ref 0.0–0.1)
Basophils Relative: 0 %
Eosinophils Absolute: 0 10*3/uL (ref 0.0–0.5)
Eosinophils Relative: 0 %
HCT: 57.7 % — ABNORMAL HIGH (ref 39.0–52.0)
Hemoglobin: 17.8 g/dL — ABNORMAL HIGH (ref 13.0–17.0)
Immature Granulocytes: 1 %
Lymphocytes Relative: 15 %
Lymphs Abs: 0.8 10*3/uL (ref 0.7–4.0)
MCH: 30.2 pg (ref 26.0–34.0)
MCHC: 30.8 g/dL (ref 30.0–36.0)
MCV: 97.8 fL (ref 80.0–100.0)
Monocytes Absolute: 0.4 10*3/uL (ref 0.1–1.0)
Monocytes Relative: 7 %
Neutro Abs: 4.1 10*3/uL (ref 1.7–7.7)
Neutrophils Relative %: 77 %
Platelets: 150 10*3/uL (ref 150–400)
RBC: 5.9 MIL/uL — ABNORMAL HIGH (ref 4.22–5.81)
RDW: 17 % — ABNORMAL HIGH (ref 11.5–15.5)
WBC: 5.3 10*3/uL (ref 4.0–10.5)
nRBC: 0 % (ref 0.0–0.2)

## 2021-04-08 LAB — LACTIC ACID, PLASMA
Lactic Acid, Venous: 2.1 mmol/L (ref 0.5–1.9)
Lactic Acid, Venous: 2.1 mmol/L (ref 0.5–1.9)

## 2021-04-08 LAB — COMPREHENSIVE METABOLIC PANEL
ALT: 27 U/L (ref 0–44)
AST: 28 U/L (ref 15–41)
Albumin: 3 g/dL — ABNORMAL LOW (ref 3.5–5.0)
Alkaline Phosphatase: 81 U/L (ref 38–126)
Anion gap: 13 (ref 5–15)
BUN: 35 mg/dL — ABNORMAL HIGH (ref 8–23)
CO2: 26 mmol/L (ref 22–32)
Calcium: 8.8 mg/dL — ABNORMAL LOW (ref 8.9–10.3)
Chloride: 104 mmol/L (ref 98–111)
Creatinine, Ser: 2.76 mg/dL — ABNORMAL HIGH (ref 0.61–1.24)
GFR, Estimated: 24 mL/min — ABNORMAL LOW (ref >60)
Glucose, Bld: 157 mg/dL — ABNORMAL HIGH (ref 70–99)
Potassium: 4.3 mmol/L (ref 3.5–5.1)
Sodium: 143 mmol/L (ref 135–145)
Total Bilirubin: 1.9 mg/dL — ABNORMAL HIGH (ref 0.3–1.2)
Total Protein: 6.1 g/dL — ABNORMAL LOW (ref 6.5–8.1)

## 2021-04-08 LAB — LIPASE, BLOOD: Lipase: 25 U/L (ref 11–51)

## 2021-04-08 LAB — PROTIME-INR
INR: 1.3 — ABNORMAL HIGH (ref 0.8–1.2)
Prothrombin Time: 16.1 seconds — ABNORMAL HIGH (ref 11.4–15.2)

## 2021-04-08 LAB — GLUCOSE, CAPILLARY
Glucose-Capillary: 110 mg/dL — ABNORMAL HIGH (ref 70–99)
Glucose-Capillary: 156 mg/dL — ABNORMAL HIGH (ref 70–99)
Glucose-Capillary: 194 mg/dL — ABNORMAL HIGH (ref 70–99)

## 2021-04-08 LAB — RESP PANEL BY RT-PCR (FLU A&B, COVID) ARPGX2
Influenza A by PCR: NEGATIVE
Influenza B by PCR: NEGATIVE
SARS Coronavirus 2 by RT PCR: NEGATIVE

## 2021-04-08 LAB — I-STAT CHEM 8, ED
BUN: 36 mg/dL — ABNORMAL HIGH (ref 8–23)
Calcium, Ion: 0.73 mmol/L — CL (ref 1.15–1.40)
Chloride: 112 mmol/L — ABNORMAL HIGH (ref 98–111)
Creatinine, Ser: 2.5 mg/dL — ABNORMAL HIGH (ref 0.61–1.24)
Glucose, Bld: 128 mg/dL — ABNORMAL HIGH (ref 70–99)
HCT: 54 % — ABNORMAL HIGH (ref 39.0–52.0)
Hemoglobin: 18.4 g/dL — ABNORMAL HIGH (ref 13.0–17.0)
Potassium: 4 mmol/L (ref 3.5–5.1)
Sodium: 140 mmol/L (ref 135–145)
TCO2: 21 mmol/L — ABNORMAL LOW (ref 22–32)

## 2021-04-08 LAB — APTT: aPTT: 39 seconds — ABNORMAL HIGH (ref 24–36)

## 2021-04-08 LAB — TROPONIN I (HIGH SENSITIVITY)
Troponin I (High Sensitivity): 29 ng/L — ABNORMAL HIGH (ref <18)
Troponin I (High Sensitivity): 22 ng/L — ABNORMAL HIGH (ref <18)

## 2021-04-08 LAB — TYPE AND SCREEN
ABO/RH(D): A POS
Antibody Screen: NEGATIVE

## 2021-04-08 LAB — MAGNESIUM: Magnesium: 2.5 mg/dL — ABNORMAL HIGH (ref 1.7–2.4)

## 2021-04-08 LAB — MRSA NEXT GEN BY PCR, NASAL: MRSA by PCR Next Gen: NOT DETECTED

## 2021-04-08 LAB — BRAIN NATRIURETIC PEPTIDE: B Natriuretic Peptide: 2679.7 pg/mL — ABNORMAL HIGH (ref 0.0–100.0)

## 2021-04-08 LAB — POC OCCULT BLOOD, ED: Fecal Occult Bld: NEGATIVE

## 2021-04-08 SURGERY — LAPAROTOMY, EXPLORATORY
Anesthesia: General

## 2021-04-08 MED ORDER — POLYETHYLENE GLYCOL 3350 17 G PO PACK: 17.0000 g | PACK | Freq: Every day | ORAL | Status: DC | PRN

## 2021-04-08 MED ORDER — SODIUM CHLORIDE 0.9 % IV SOLN
2.0000 g | Freq: Once | INTRAVENOUS | Status: AC
  Administered 2021-04-08: 2 g via INTRAVENOUS
  Filled 2021-04-08: qty 2

## 2021-04-08 MED ORDER — CHLORHEXIDINE GLUCONATE CLOTH 2 % EX PADS
6.0000 | MEDICATED_PAD | Freq: Every day | CUTANEOUS | Status: DC
  Administered 2021-04-09 – 2021-04-17 (×9): 6 via TOPICAL

## 2021-04-08 MED ORDER — METRONIDAZOLE 500 MG/100ML IV SOLN
500.0000 mg | Freq: Two times a day (BID) | INTRAVENOUS | Status: DC
  Administered 2021-04-08 – 2021-04-09 (×3): 500 mg via INTRAVENOUS
  Filled 2021-04-08 (×3): qty 100

## 2021-04-08 MED ORDER — SODIUM CHLORIDE 0.9 % IV BOLUS: 1000.0000 mL | Freq: Once | INTRAVENOUS | Status: DC

## 2021-04-08 MED ORDER — METRONIDAZOLE 500 MG/100ML IV SOLN
500.0000 mg | Freq: Once | INTRAVENOUS | Status: AC
  Administered 2021-04-08: 500 mg via INTRAVENOUS
  Filled 2021-04-08: qty 100

## 2021-04-08 MED ORDER — VANCOMYCIN HCL 1750 MG/350ML IV SOLN
1750.0000 mg | Freq: Once | INTRAVENOUS | Status: AC
  Administered 2021-04-08: 1750 mg via INTRAVENOUS
  Filled 2021-04-08: qty 350

## 2021-04-08 MED ORDER — WHITE PETROLATUM EX OINT
TOPICAL_OINTMENT | CUTANEOUS | Status: AC
  Filled 2021-04-08: qty 28.35

## 2021-04-08 MED ORDER — DOCUSATE SODIUM 100 MG PO CAPS: 100.0000 mg | ORAL_CAPSULE | Freq: Two times a day (BID) | ORAL | Status: DC | PRN

## 2021-04-08 MED ORDER — SODIUM CHLORIDE 0.9 % IV SOLN
250.0000 mL | INTRAVENOUS | Status: DC
  Administered 2021-04-08: 250 mL via INTRAVENOUS

## 2021-04-08 MED ORDER — NOREPINEPHRINE 4 MG/250ML-% IV SOLN
2.0000 ug/min | INTRAVENOUS | Status: DC
  Administered 2021-04-08: 2 ug/min via INTRAVENOUS
  Administered 2021-04-08: 8 ug/min via INTRAVENOUS
  Administered 2021-04-09: 9 ug/min via INTRAVENOUS
  Administered 2021-04-09: 3 ug/min via INTRAVENOUS
  Filled 2021-04-08 (×4): qty 250

## 2021-04-08 MED ORDER — PANTOPRAZOLE INFUSION (NEW) - SIMPLE MED
8.0000 mg/h | INTRAVENOUS | Status: DC
  Administered 2021-04-08 – 2021-04-09 (×3): 8 mg/h via INTRAVENOUS
  Filled 2021-04-08 (×3): qty 80

## 2021-04-08 MED ORDER — LACTATED RINGERS IV SOLN: INTRAVENOUS | Status: DC

## 2021-04-08 MED ORDER — PANTOPRAZOLE SODIUM 40 MG IV SOLR: 40.0000 mg | Freq: Two times a day (BID) | INTRAVENOUS | Status: DC

## 2021-04-08 MED ORDER — SODIUM CHLORIDE 0.9 % IV BOLUS
500.0000 mL | Freq: Once | INTRAVENOUS | Status: AC
  Administered 2021-04-08: 500 mL via INTRAVENOUS

## 2021-04-08 MED ORDER — PANTOPRAZOLE 80MG IVPB - SIMPLE MED
80.0000 mg | Freq: Once | INTRAVENOUS | Status: AC
  Administered 2021-04-08: 80 mg via INTRAVENOUS
  Filled 2021-04-08: qty 80

## 2021-04-08 MED ORDER — SODIUM CHLORIDE 0.9 % IV SOLN
2.0000 g | Freq: Two times a day (BID) | INTRAVENOUS | Status: DC
  Administered 2021-04-08 (×2): 2 g via INTRAVENOUS
  Filled 2021-04-08 (×3): qty 2

## 2021-04-08 MED ORDER — MUPIROCIN 2 % EX OINT
TOPICAL_OINTMENT | Freq: Two times a day (BID) | CUTANEOUS | Status: DC
  Administered 2021-04-08 – 2021-04-16 (×7): 1 via TOPICAL
  Filled 2021-04-08 (×3): qty 22

## 2021-04-08 MED ORDER — VANCOMYCIN HCL IN DEXTROSE 1-5 GM/200ML-% IV SOLN
1000.0000 mg | INTRAVENOUS | Status: DC
  Administered 2021-04-09: 1000 mg via INTRAVENOUS
  Filled 2021-04-08: qty 200

## 2021-04-08 MED ORDER — ONDANSETRON HCL 4 MG/2ML IJ SOLN
4.0000 mg | Freq: Once | INTRAMUSCULAR | Status: AC
  Administered 2021-04-08: 4 mg via INTRAVENOUS
  Filled 2021-04-08: qty 2

## 2021-04-08 NOTE — Progress Notes (Signed)
RT called to bring BIPAP. Upon arrival pt on NRB, 5L [place] Spo2 96%. No distress noted at this time. BIPAP on standby at bedside.

## 2021-04-08 NOTE — Progress Notes (Signed)
04-08-21 HF CSW completed online 72-hour notification for VA Authorization- #[id number]. Kholton Coate P, MSW, [employer] Social Worker [place] notification ID [id number] submitted online through [place] portal.   [place], MSW, [place] Heart Failure Social Worker

## 2021-04-08 NOTE — ED Notes (Signed)
Dr Toth at bedside. 

## 2021-04-08 NOTE — Sepsis Progress Note (Signed)
Communication took place with Dr Randal Buba, aware of SBP < 90 x 2, due to EF 20% there will not be plans for additional fluids at this time

## 2021-04-08 NOTE — Progress Notes (Signed)
Daytime Note  Seen and examined.  Consultations appreciated; still deciding on if surgery can be offered or if a more conservative approach is warranted.  Patient stable through day.  Erskine Emery MD PCCM

## 2021-04-08 NOTE — ED Notes (Signed)
Patient cleaned for moderate amount of diarrhea and semi formed stool.

## 2021-04-08 NOTE — ED Triage Notes (Signed)
Patient here from home, with vomiting for 5 days.  No diarrhea.  Patient with little intake due to vomiting every time he eats.  He denies any frank blood in stool.  Patient is pale upon arrival, hypotensive.

## 2021-04-08 NOTE — ED Provider Notes (Addendum)
[facility] EMERGENCY DEPARTMENT Provider Note   CSN: [id number] Arrival date & time: 04/08/21  0216     History Chief Complaint  Patient presents with   Emesis    Anthony Rubio. is a 70 y.o. male.  The history is provided by the patient and the spouse. The history is limited by the condition of the patient.  Emesis Severity:  Severe Duration:  5 weeks Timing:  Intermittent Number of daily episodes:  Many Quality:  Stomach contents Progression:  Unchanged Chronicity:  New Context: not post-tussive   Relieved by:  Nothing Worsened by:  Nothing Ineffective treatments:  None tried Associated symptoms: cough and diarrhea   Associated symptoms: no abdominal pain, no chills, no fever, no headaches, no myalgias and no sore throat   Associated symptoms comment:  "Hasn't felt well since I had my defibrillator placed" Risk factors: diabetes   Risk factors: no prior abdominal surgery       Past Medical History:  Diagnosis Date   AICD (automatic cardioverter/defibrillator) present    Aortic stenosis    a. Mild by echo 04/2011.    Arthritis    Atrial flutter ([place])    s/p DCCV 9/11   CAD (coronary artery disease)    a. LHC (5/11) with 50-60% mid LAD, diffuse mod distal LAD up to 60-70%, small OM2 subtotally occ, mod-severe diffuse distal PLOM, severe diffuse distal RCA, PDA, and PLV  disease. Started on Plavix given concern for thrombus formation in the ectatic cors with slow flow. b. Stopped Plavix after starting Coumadin for a-flutter.   CHF (congestive heart failure) ([place])    a. Likely mixed ICM/NICM. Echo (5/11) with EF 15-20%, severe global HK, mild MR, mod d/d, mildly decreased RV fcn. RHC (5/11) with mean RA 14 mmHg, PA 38/26, mean PCWP 25 mmHg, CI 1.8.  b. TEE (9/11): EF 15% no LV thrombus, no LAA thrombus. c. s/p ICD 11/11. d. Last echo 04/2011: LV severely dilated, EF 25%, diffuse HK, mild AS, mildly dilated LA, trivial pericardial effusion.   CKD  (chronic kidney disease), stage III (HCC)    Complication of anesthesia    Slow to awaken   Diabetic neuropathy (HCC)    severe; with chronic gait dysfunction   Dysrhythmia    Foot ulcer, left (HCC)    poor healing   GERD (gastroesophageal reflux disease)    H.pyloric   History of echocardiogram 07/2015   Echo 1/17: EF 20-25%, inf-lat AK, restrictive physio, mild AS (mean 9 mmHg), mild AI, mod MR, severe LAE, mod reduced RVSF, mild RAE, mild TR, PASP 50 mmHg   HTN (hypertension)    ICD (implantable cardiac defibrillator) in place 11/11   Medtronic dual cha,ber ICD with Optivol   Neuropathy associated with endocrine disorder ([place])    feet anad finger tips   Tobacco abuse    Uncontrolled type II diabetes mellitus     Patient Active Problem List   Diagnosis Date Noted   Cigarette smoker 08/10/2018   GERD (gastroesophageal reflux disease) 08/10/2018   Dyslipidemia associated with type 2 diabetes mellitus ([place]) 06/01/2017   Cardiomyopathy- Mixed Ischemic and Non-Ischemic 03/07/2015   ICD (implantable cardioverter-defibrillator), dual, in situ 03/07/2015   Aortic stenosis 05/03/2014   Atrial flutter ([place]) [id number]   Chronic systolic heart failure ([place]) 12/19/2009   CERVICAL SPONDYLOSIS WITH MYELOPATHY 11/22/2009   GAIT DISTURBANCE 11/22/2009   Type 2 diabetes, uncontrolled, with neuropathy ([place]) 11/21/2009   SMOKER 11/21/2009   CAD (coronary  artery disease) 11/21/2009   Chronic kidney disease, stage III (moderate) ([place]) 11/21/2009   Hypertension associated with diabetes ([place]) 11/14/2009    Past Surgical History:  Procedure Laterality Date   BIV UPGRADE N/A 03/13/2021   Procedure: UPGRADE TO BIVI ICD;  Surgeon: Evans Lance, MD;  Location: [place] CV LAB;  Service: Cardiovascular;  Laterality: N/A;   CARDIOVERSION N/A 08/23/2017   Procedure: CARDIOVERSION;  Surgeon: Larey Dresser, MD;  Location: [facility] ENDOSCOPY;  Service: Cardiovascular;  Laterality: N/A;   CARDIOVERSION  N/A 09/12/2020   Procedure: CARDIOVERSION;  Surgeon: Larey Dresser, MD;  Location: [facility] ENDOSCOPY;  Service: Cardiovascular;  Laterality: N/A;  STAT INR!!!   CARDIOVERSION N/A 10/10/2020   Procedure: CARDIOVERSION;  Surgeon: Larey Dresser, MD;  Location: [facility] ENDOSCOPY;  Service: Cardiovascular;  Laterality: N/A;   COLONOSCOPY     COLONOSCOPY WITH PROPOFOL N/A 12/01/2017   Procedure: COLONOSCOPY WITH PROPOFOL;  Surgeon: Arta Silence, MD;  Location: [place];  Service: Endoscopy;  Laterality: N/A;   ESOPHAGOGASTRODUODENOSCOPY     ICD GENERATOR CHANGEOUT N/A 02/01/2018   Procedure: ICD GENERATOR CHANGEOUT;  Surgeon: Thompson Grayer, MD;  Location: [place] CV LAB;  Service: Cardiovascular;  Laterality: N/A;   ICD implantation  2011   by JA   TEE WITHOUT CARDIOVERSION N/A 08/23/2017   Procedure: TRANSESOPHAGEAL ECHOCARDIOGRAM (TEE);  Surgeon: Larey Dresser, MD;  Location: [facility] ENDOSCOPY;  Service: Cardiovascular;  Laterality: N/A;   TEE WITHOUT CARDIOVERSION N/A 10/10/2020   Procedure: TRANSESOPHAGEAL ECHOCARDIOGRAM (TEE);  Surgeon: Larey Dresser, MD;  Location: [facility] ENDOSCOPY;  Service: Cardiovascular;  Laterality: N/A;       Family History  Problem Relation Age of Onset   Stroke Mother    Early death Father    Congestive Heart Failure Maternal Grandmother    Gout Maternal Grandfather    Diabetes Paternal Grandfather    Cancer Paternal Grandfather    Heart attack Son    Early death Son     Social History   Tobacco Use   Smoking status: Some Days    Packs/day: 0.25    Years: 33.00    Pack years: 8.25    Types: Cigarettes   Smokeless tobacco: Never   Tobacco comments:    about 1/4 ppd. Married, lives with wife. 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 Authorizing  Provider  amiodarone (PACERONE) 200 MG tablet Take 1 tablet (200 mg total) by mouth daily. 01/03/21   Allred, Jeneen Rinks, MD  apixaban (ELIQUIS) 5 MG TABS tablet Take 1 tablet (5 mg total) by mouth 2 (two) times daily. 09/23/20   Larey Dresser, MD  atorvastatin (LIPITOR) 80 MG tablet Take 80 mg by mouth daily.    [provider]  carvedilol (COREG) 6.25 MG tablet Take 2 tablets (12.5 mg total) by mouth 2 (two) times daily with a meal. Patient taking differently: Take 18.75 mg by mouth 2 (two) times daily with a meal. 09/30/20   Tillery, Satira Mccallum, PA-C  dapagliflozin propanediol (FARXIGA) 10 MG TABS tablet Take 10 mg by mouth daily.    [provider]  empagliflozin (JARDIANCE) 10 MG TABS tablet Take 1 tablet (10 mg total) by mouth daily before breakfast. Patient not taking: Reported on 03/06/2021  02/14/21   Larey Dresser, MD  ezetimibe (ZETIA) 10 MG tablet Take 1 tablet (10 mg total) by mouth daily. 09/09/20   Milford, Maricela Bo, FNP  glipiZIDE (GLUCOTROL XL) 10 MG 24 hr tablet Take 1-2 tablets (10-20 mg total) by mouth daily with breakfast. Take 2 tablets if blood sugar > 200. 02/20/19   Vivi Barrack, MD  isosorbide mononitrate (IMDUR) 30 MG 24 hr tablet Take 1/2 (one-half) tablet by mouth once daily Patient taking differently: Take 15 mg by mouth daily. 10/18/20   Larey Dresser, MD  Lancets (ACCU-CHEK SOFT [facility]) lancets Use to test blood sugar once a day E11.9 08/04/18   Vivi Barrack, MD  torsemide (DEMADEX) 20 MG tablet Take 1 tablet (20 mg total) by mouth every other day. Patient taking differently: Take 40 mg by mouth every other day. 12/12/20   Larey Dresser, MD    Allergies    Erythromycin, Hydralazine, Lisinopril, and Losartan potassium  Review of Systems   Review of Systems  Unable to perform ROS: Acuity of condition  Constitutional:  Negative for chills and fever.  HENT:  Negative for facial swelling and sore throat.   Eyes:  Negative for redness.   Respiratory:  Positive for cough. Negative for wheezing and stridor.   Gastrointestinal:  Positive for diarrhea and vomiting. Negative for abdominal pain.  Musculoskeletal:  Negative for myalgias.  Skin:  Positive for rash.  Neurological:  Negative for headaches.  Psychiatric/Behavioral:  Negative for agitation.    Physical Exam Updated Vital Signs BP 94/71   Pulse 70   Temp 99.1 F (37.3 C)   Resp 15   Ht '6\' 3"'$  (1.905 m)   Wt 90.7 kg   SpO2 96%   BMI 25.00 kg/m   Physical Exam Vitals and nursing note reviewed. Exam conducted with a chaperone present.  Constitutional:      General: He is in acute distress.     Appearance: He is not diaphoretic.  HENT:     Head: Normocephalic and atraumatic.     Nose: Nose normal.  Eyes:     Extraocular Movements: Extraocular movements intact.     Pupils: Pupils are equal, round, and reactive to light.  Cardiovascular:     Rate and Rhythm: Normal rate and regular rhythm.     Pulses: Normal pulses.     Heart sounds: Normal heart sounds.  Pulmonary:     Effort: Tachypnea present.     Breath sounds: Rales present.  Abdominal:     General: Abdomen is flat. Bowel sounds are normal.     Palpations: Abdomen is soft.     Tenderness: There is no abdominal tenderness. There is no guarding or rebound.  Genitourinary:    Rectum: Guaiac result negative.     Comments: Stage 1 large pressure ulcer  Musculoskeletal:        General: No tenderness.     Cervical back: Normal range of motion and neck supple.       Legs:  Lymphadenopathy:     Cervical: No cervical adenopathy.  Skin:    General: Skin is warm and dry.     Capillary Refill: Capillary refill takes less than 2 seconds.  Neurological:     General: No focal deficit present.     Mental Status: He is alert.     Deep Tendon Reflexes: Reflexes normal.  Psychiatric:        Mood and Affect: Mood normal.  Behavior: Behavior normal.    ED Results / Procedures / Treatments    Labs (all labs ordered are listed, but only abnormal results are displayed) Results for orders placed or performed during the hospital encounter of 04/08/21  Resp Panel by RT-PCR (Flu A&B, Covid) Nasopharyngeal Swab   Specimen: Nasopharyngeal Swab; Nasopharyngeal(NP) swabs in vial transport medium  Result Value Ref Range   SARS Coronavirus 2 by RT PCR NEGATIVE NEGATIVE   Influenza A by PCR NEGATIVE NEGATIVE   Influenza B by PCR NEGATIVE NEGATIVE  Comprehensive metabolic panel  Result Value Ref Range   Sodium 143 135 - 145 mmol/L   Potassium 4.3 3.5 - 5.1 mmol/L   Chloride 104 98 - 111 mmol/L   CO2 26 22 - 32 mmol/L   Glucose, Bld 157 (H) 70 - 99 mg/dL   BUN 35 (H) 8 - 23 mg/dL   Creatinine, Ser 2.76 (H) 0.61 - 1.24 mg/dL   Calcium 8.8 (L) 8.9 - 10.3 mg/dL   Total Protein 6.1 (L) 6.5 - 8.1 g/dL   Albumin 3.0 (L) 3.5 - 5.0 g/dL   AST 28 15 - 41 U/L   ALT 27 0 - 44 U/L   Alkaline Phosphatase 81 38 - 126 U/L   Total Bilirubin 1.9 (H) 0.3 - 1.2 mg/dL   GFR, Estimated 24 (L) >60 mL/min   Anion gap 13 5 - 15  Lipase, blood  Result Value Ref Range   Lipase 25 11 - 51 U/L  CBC with Diff  Result Value Ref Range   WBC 5.3 4.0 - 10.5 K/uL   RBC 5.90 (H) 4.22 - 5.81 MIL/uL   Hemoglobin 17.8 (H) 13.0 - 17.0 g/dL   HCT 57.7 (H) 39.0 - 52.0 %   MCV 97.8 80.0 - 100.0 fL   MCH 30.2 26.0 - 34.0 pg   MCHC 30.8 30.0 - 36.0 g/dL   RDW 17.0 (H) 11.5 - 15.5 %   Platelets 150 150 - 400 K/uL   nRBC 0.0 0.0 - 0.2 %   Neutrophils Relative % 77 %   Neutro Abs 4.1 1.7 - 7.7 K/uL   Lymphocytes Relative 15 %   Lymphs Abs 0.8 0.7 - 4.0 K/uL   Monocytes Relative 7 %   Monocytes Absolute 0.4 0.1 - 1.0 K/uL   Eosinophils Relative 0 %   Eosinophils Absolute 0.0 0.0 - 0.5 K/uL   Basophils Relative 0 %   Basophils Absolute 0.0 0.0 - 0.1 K/uL   Immature Granulocytes 1 %   Abs Immature Granulocytes 0.03 0.00 - 0.07 K/uL  POC occult blood, ED  Result Value Ref Range   Fecal Occult Bld NEGATIVE  NEGATIVE  Troponin I (High Sensitivity)  Result Value Ref Range   Troponin I (High Sensitivity) 29 (H) <18 ng/L   DG Chest 2 View  Result Date: 03/13/2021 CLINICAL DATA:  Status post defibrillator placement EXAM: CHEST - 2 VIEW COMPARISON:  10/23/2013 FINDINGS: The lungs are symmetrically well expanded. There is mild diffuse interstitial pulmonary edema, likely cardiogenic in nature. No pneumothorax or pleural effusion. Mild cardiomegaly is present, progressive since prior examination. Left subclavian 3 lead pacemaker defibrillator is in place. No acute bone abnormality. IMPRESSION: Progressive cardiomegaly. Mild diffuse interstitial pulmonary edema most in keeping with mild cardiogenic failure. Electronically Signed   By: Fidela Salisbury M.D.   On: 03/13/2021 19:07   EP PPM/ICD IMPLANT  Result Date: 03/13/2021 Conclusion: Successful upgrade of a dual-chamber ICD to a biventricular ICD in a patient  with class III heart failure and pacing induced left bundle branch block. Cristopher Peru, MD   DG Chest Portable 1 View  Result Date: 04/08/2021 CLINICAL DATA:  Vomiting for several days EXAM: PORTABLE CHEST 1 VIEW COMPARISON:  03/13/2021 FINDINGS: Check shadow is enlarged. Defibrillator is again noted and stable. The lungs are well aerated bilaterally with the exception of the left base with left retrocardiac density and small left effusion. IMPRESSION: Left basilar opacity with small effusion. Electronically Signed   By: Inez Catalina M.D.   On: 04/08/2021 03:49   CUP PACEART INCLINIC DEVICE CHECK  Result Date: 03/28/2021 Wound check appointment s/p ICD gen change/upgrade to Bi-V with new LV lead implant. Steri-strips removed. Wound without redness or edema. Incision edges approximated, wound well healed. Normal device function. Thresholds, sensing, and impedances consistent with implant measurements. Histogram distribution appropriate for patient and level of activity. No mode switches or ventricular  arrhythmias noted. Patient educated about wound care, arm mobility, lifting restrictions, shock plan. Patient enrolled in remote monitoring with next transmission scheduled 04/28/21. 91 day follow up with Dr. Lovena Le 06/25/21. Of note patient states he has not felt well post gen change. No abnormalities on device. Patient is enrolled in [facility] clinic and active with  the HF team. States he has felt extremely weak recently. Encouraged patient to check BP at home and discuss with HF team. BP today 100/67.Portia E. Rollene Rotunda, BSN, RN   EKG None  Radiology DG Chest Portable 1 View  Result Date: 04/08/2021 CLINICAL DATA:  Vomiting for several days EXAM: PORTABLE CHEST 1 VIEW COMPARISON:  03/13/2021 FINDINGS: Check shadow is enlarged. Defibrillator is again noted and stable. The lungs are well aerated bilaterally with the exception of the left base with left retrocardiac density and small left effusion. IMPRESSION: Left basilar opacity with small effusion. Electronically Signed   By: Inez Catalina M.D.   On: 04/08/2021 03:49    Procedures Procedures   Medications Ordered in ED Medications  0.9 %  sodium chloride infusion (250 mLs Intravenous New Bag/Given 04/08/21 0608)  norepinephrine (LEVOPHED) '4mg'$  in 235m premix infusion (7.5 mcg/min Intravenous Rate/Dose Change 04/08/21 0622)  pantoprazole (PROTONIX) 80 mg /NS 100 mL IVPB (has no administration in time range)  pantoprozole (PROTONIX) 80 mg /NS 100 mL infusion (has no administration in time range)  pantoprazole (PROTONIX) injection 40 mg (has no administration in time range)  ondansetron (ZOFRAN) injection 4 mg (4 mg Intravenous Given 04/08/21 0334)  sodium chloride 0.9 % bolus 500 mL (0 mLs Intravenous Stopped 04/08/21 0430)  ceFEPIme (MAXIPIME) 2 g in sodium chloride 0.9 % 100 mL IVPB (0 g Intravenous Stopped 04/08/21 0416)  metroNIDAZOLE (FLAGYL) IVPB 500 mg (0 mg Intravenous Stopped 04/08/21 0541)  vancomycin (VANCOREADY) IVPB 1750 mg/350 mL (0 mg  Intravenous Stopped 04/08/21 0622)     MDM Number of Diagnoses or Management Options Dehydration: new, needed workup Nausea vomiting and diarrhea: new, needed workup   Amount and/or Complexity of Data Reviewed Clinical lab tests: ordered and reviewed Decide to obtain previous medical records or to obtain history from someone other than the patient: yes Obtain history from someone other than the patient: yes Review and summarize past medical records: yes Discuss the patient with other providers: yes Independent visualization of images, tracings, or specimens: yes  Risk of Complications, Morbidity, and/or Mortality Presenting problems: high Diagnostic procedures: high Management options: high  Critical Care Total time providing critical care: 75-105 minutes (sepsis bundle antibiotics and bipap)  MDM Reviewed: previous  chart, nursing note and vitals Reviewed previous: x-ray and labs Interpretation: labs, x-ray, CT scan and ECG (elevated hemoglobin consistent with hemoconcentration. Elevated troponin and BUN and Cr,) Total time providing critical care: 75-105 minutes (sepsis bundle antibiotics and bipap). This excludes time spent performing separately reportable procedures and services. Consults: critical care and general surgery (case d/w Dr. Redmond Pulling of [place] who will see the patient, 640 am)    CRITICAL CARE Performed by: Loron Weimer K Selam Pietsch-Rasch Total critical care time: 75 minutes Critical care time was exclusive of separately billable procedures and treating other patients. Critical care was necessary to treat or prevent imminent or life-threatening deterioration. Critical care was time spent personally by me on the following activities: development of treatment plan with patient and/or surrogate as well as nursing, discussions with consultants, evaluation of patient's response to treatment, examination of patient, obtaining history from patient or surrogate, ordering and performing  treatments and interventions, ordering and review of laboratory studies, ordering and review of radiographic studies, pulse oximetry and re-evaluation of patient's condition.  No 30 cc/kg bolus due to lungs already fluid overloaded and has EF of 20%,  Cannot due bolus  ED Course  I have reviewed the triage vital signs and the nursing notes.  Pertinent labs & imaging results that were available during my care of the patient were reviewed by me and considered in my medical decision making (see chart for details).   Christoph Spaulding. was evaluated in Emergency Department on 04/08/2021 for the symptoms described in the history of present illness. He was evaluated in the context of the global COVID-19 pandemic, which necessitated consideration that the patient might be at risk for infection with the SARS-CoV-2 virus that causes COVID-19. Institutional protocols and algorithms that pertain to the evaluation of patients at risk for COVID-19 are in a state of rapid change based on information released by regulatory bodies including the CDC and federal and state organizations. These policies and algorithms were followed during the patient's care in the ED.  Final Clinical Impression(s) / ED Diagnoses Final diagnoses:  None   Admit to ICU    Surgery to consult     Matalyn Nawaz, MD 04/08/21 [id number]

## 2021-04-08 NOTE — ED Notes (Signed)
Attempted report 

## 2021-04-08 NOTE — ED Notes (Signed)
Taken to CT with RN

## 2021-04-08 NOTE — Consult Note (Signed)
[place] Nurse Consult Note: Patient receiving care in [facility] 3M8. Reason for Consult: right foot wounds, left ankle wound, buttock area Wound type: right lateral malleoli wound very likely related to arterial impairment. Scab to left heel. NO other wounds found on right foot, left ankle, or buttocks Pressure Injury POA: Yes/No/NA Measurement: right ankle wound measures 2 cm x 1.5 cm x 0.2 cm. Patient tells me the right ankle wound has been present intermittently for quite a few years. Wound bed: pink Drainage (amount, consistency, odor) none Periwound: darkened Dressing procedure/placement/frequency: Apply mupirocin ointment to right lateral ankle wound, cover with bandage. This is a BID treatment.  I have also ordered bilateral Prevalon heel lift boots.  Monitor the wound area(s) for worsening of condition such as: Signs/symptoms of infection,  Increase in size,  Development of or worsening of odor, Development of pain, or increased pain at the affected locations.  Notify the medical team if any of these develop.  Thank you for the consult. [place] nurse will not follow at this time.  Please re-consult the [place] team if needed.  Val Riles, RN, MSN, CWOCN, CNS-BC, pager [phone]

## 2021-04-08 NOTE — Anesthesia Preprocedure Evaluation (Deleted)
Anesthesia Evaluation    Airway        Dental   Pulmonary Current Smoker,           Cardiovascular hypertension, + CAD and +CHF  + dysrhythmias (last dose of eliquis 10/3) Atrial Fibrillation + Cardiac Defibrillator   On Levophed   Neuro/Psych    GI/Hepatic GERD  ,  Endo/Other  diabetes  Renal/GU CRF, Renal Insufficiency and ARFRenal disease     Musculoskeletal  (+) Arthritis ,   Abdominal   Peds  Hematology   Anesthesia Other Findings   Reproductive/Obstetrics                             Anesthesia Physical Anesthesia Plan  ASA: 4  Anesthesia Plan: General   Post-op Pain Management:    Induction: Intravenous  PONV Risk Score and Plan: 1 and Ondansetron and Treatment may vary due to age or medical condition  Airway Management Planned: Oral ETT  Additional Equipment: Arterial line and CVP  Intra-op Plan:   Post-operative Plan: Post-operative intubation/ventilation  Informed Consent:   Plan Discussed with: CRNA, Anesthesiologist and Surgeon  Anesthesia Plan Comments: (Labs indicate patient very dehydrated. Already fluid overloaded. Will need to remain intubated postoperatively. Needs Type and screen. Norton Blizzard, MD  )        Anesthesia Quick Evaluation

## 2021-04-08 NOTE — Consult Note (Signed)
Consult Note  Anthony Rubio. 18-Feb-1951  [id number].    Requesting MD: Dr. Freda Jackson Chief Complaint/Reason for Consult: Perforated viscus  HPI:  Patient is a 70 year old male who presented to [facility] with 5 days of intractable vomiting. He denied hematemesis and denied significant abdominal pain. He reports abdomen has been a little sore. He developed diarrhea in the ED. He denies fever, chills. His wife reports he had been taking Mylanta at home up until Friday last week when the vomiting started. She tried giving him medication to help with the vomiting but it did not work. Patient denies abdominal pain other than with coughing. He reports he had a defibrillator replaced and he hasn't really felt well since that. PMH significant for CHF with EF <20% s/p AICD, Hx of HTN, A. Fib on eliquis, CKD stage III, T2DM, and tobacco use. Patient reports last dose eliquis was yesterday around noon but he only took half the dose. He denies past abdominal surgery.   He was hypotensive in the ED and improved marginally with IV fluids. CCM was consulted and patient was started on pressor. CT abdomen pelvis with concern for perforated bowel and possible gastric ulcer. Patient was started on PPI gtt and abx. General surgery asked to see.   ROS: Review of Systems  Constitutional:  Positive for malaise/fatigue. Negative for chills and fever.  Respiratory:  Positive for cough and shortness of breath.   Cardiovascular:  Negative for chest pain.  Gastrointestinal:  Positive for diarrhea, nausea and vomiting. Negative for abdominal pain, blood in stool and melena.  Genitourinary:  Negative for dysuria, frequency and urgency.  Neurological:  Positive for weakness.  All other systems reviewed and are negative.  Family History  Problem Relation Age of Onset   Stroke Mother    Early death Father    Congestive Heart Failure Maternal Grandmother    Gout Maternal Grandfather    Diabetes Paternal  Grandfather    Cancer Paternal Grandfather    Heart attack Son    Early death Son     Past Medical History:  Diagnosis Date   AICD (automatic cardioverter/defibrillator) present    Aortic stenosis    a. Mild by echo 04/2011.    Arthritis    Atrial flutter ([place])    s/p DCCV 9/11   CAD (coronary artery disease)    a. LHC (5/11) with 50-60% mid LAD, diffuse mod distal LAD up to 60-70%, small OM2 subtotally occ, mod-severe diffuse distal PLOM, severe diffuse distal RCA, PDA, and PLV  disease. Started on Plavix given concern for thrombus formation in the ectatic cors with slow flow. b. Stopped Plavix after starting Coumadin for a-flutter.   CHF (congestive heart failure) ([place])    a. Likely mixed ICM/NICM. Echo (5/11) with EF 15-20%, severe global HK, mild MR, mod d/d, mildly decreased RV fcn. RHC (5/11) with mean RA 14 mmHg, PA 38/26, mean PCWP 25 mmHg, CI 1.8.  b. TEE (9/11): EF 15% no LV thrombus, no LAA thrombus. c. s/p ICD 11/11. d. Last echo 04/2011: LV severely dilated, EF 25%, diffuse HK, mild AS, mildly dilated LA, trivial pericardial effusion.   CKD (chronic kidney disease), stage III (HCC)    Complication of anesthesia    Slow to awaken   Diabetic neuropathy (HCC)    severe; with chronic gait dysfunction   Dysrhythmia    Foot ulcer, left (HCC)    poor healing   GERD (gastroesophageal reflux disease)  H.pyloric   History of echocardiogram 07/2015   Echo 1/17: EF 20-25%, inf-lat AK, restrictive physio, mild AS (mean 9 mmHg), mild AI, mod MR, severe LAE, mod reduced RVSF, mild RAE, mild TR, PASP 50 mmHg   HTN (hypertension)    ICD (implantable cardiac defibrillator) in place 11/11   Medtronic dual cha,ber ICD with Optivol   Neuropathy associated with endocrine disorder ([place])    feet anad finger tips   Tobacco abuse    Uncontrolled type II diabetes mellitus     Past Surgical History:  Procedure Laterality Date   BIV UPGRADE N/A 03/13/2021   Procedure: UPGRADE TO BIVI ICD;   Surgeon: Evans Lance, MD;  Location: [facility];  Service: Cardiovascular;  Laterality: N/A;   CARDIOVERSION N/A 08/23/2017   Procedure: CARDIOVERSION;  Surgeon: Larey Dresser, MD;  Location: [facility] ENDOSCOPY;  Service: Cardiovascular;  Laterality: N/A;   CARDIOVERSION N/A 09/12/2020   Procedure: CARDIOVERSION;  Surgeon: Larey Dresser, MD;  Location: [facility] ENDOSCOPY;  Service: Cardiovascular;  Laterality: N/A;  STAT INR!!!   CARDIOVERSION N/A 10/10/2020   Procedure: CARDIOVERSION;  Surgeon: Larey Dresser, MD;  Location: [facility] ENDOSCOPY;  Service: Cardiovascular;  Laterality: N/A;   COLONOSCOPY     COLONOSCOPY WITH PROPOFOL N/A 12/01/2017   Procedure: COLONOSCOPY WITH PROPOFOL;  Surgeon: Arta Silence, MD;  Location: [facility];  Service: Endoscopy;  Laterality: N/A;   ESOPHAGOGASTRODUODENOSCOPY     ICD GENERATOR CHANGEOUT N/A 02/01/2018   Procedure: ICD GENERATOR CHANGEOUT;  Surgeon: Thompson Grayer, MD;  Location: [facility];  Service: Cardiovascular;  Laterality: N/A;   ICD implantation  2011   by JA   TEE WITHOUT CARDIOVERSION N/A 08/23/2017   Procedure: TRANSESOPHAGEAL ECHOCARDIOGRAM (TEE);  Surgeon: Larey Dresser, MD;  Location: [facility] ENDOSCOPY;  Service: Cardiovascular;  Laterality: N/A;   TEE WITHOUT CARDIOVERSION N/A 10/10/2020   Procedure: TRANSESOPHAGEAL ECHOCARDIOGRAM (TEE);  Surgeon: Larey Dresser, MD;  Location: [facility] ENDOSCOPY;  Service: Cardiovascular;  Laterality: N/A;    Social History:  reports that he has been smoking cigarettes. He has a 8.25 pack-year smoking history. He has never used smokeless tobacco. He reports that he does not drink alcohol and does not use drugs.  Allergies:  Allergies  Allergen Reactions   Erythromycin Other (See Comments)    Toes/finger tingling   Hydralazine Other (See Comments)    Fatigue--felt terrible   Lisinopril Other (See Comments)    Higher doses=lightheadness/dizziness/feeling faint   Losartan Potassium Other (See  Comments)    Extreme fatigue    (Not in a hospital admission)   Blood pressure (!) 88/76, pulse 72, temperature (!) 96.4 F (35.8 C), temperature source Axillary, resp. rate (!) 21, height '6\' 3"'$  (1.905 m), weight 90.7 kg, SpO2 98 %. Physical Exam:  General: pleasant, WD, normal weight male who is laying in bed in NAD HEENT: head is normocephalic, atraumatic.  Sclera are noninjected. Ears and nose without any masses or lesions.  Mouth is pink and moist Heart: regular, rate, and rhythm.  Normal s1,s2. No obvious murmurs, gallops, or rubs noted.  Palpable radial and pedal pulses bilaterally Lungs: rales in bases bilaterally, on NRB mask  Abd: soft, NT, ND, +BS, no masses, hernias, or organomegaly MS: no edema noted in hands BL, trace edema in BL feet Skin: warm and dry with no masses, lesions, or rashes Neuro: Cranial nerves 2-12 grossly intact, sensation is normal throughout Psych: A&Ox3 with an appropriate affect.   Results for orders  placed or performed during the hospital encounter of 04/08/21 (from the past 48 hour(s))  Resp Panel by RT-PCR (Flu A&B, Covid) Nasopharyngeal Swab     Status: None   Collection Time: 04/08/21  3:00 AM   Specimen: Nasopharyngeal Swab; Nasopharyngeal(NP) swabs in vial transport medium  Result Value Ref Range   SARS Coronavirus 2 by RT PCR NEGATIVE NEGATIVE    Comment: (NOTE) SARS-CoV-2 target nucleic acids are NOT DETECTED.  The SARS-CoV-2 RNA is generally detectable in upper respiratory specimens during the acute phase of infection. The lowest concentration of SARS-CoV-2 viral copies this assay can detect is 138 copies/mL. A negative result does not preclude SARS-Cov-2 infection and should not be used as the sole basis for treatment or other patient management decisions. A negative result may occur with  improper specimen collection/handling, submission of specimen other than nasopharyngeal swab, presence of viral mutation(s) within the areas  targeted by this assay, and inadequate number of viral copies(<138 copies/mL). A negative result must be combined with clinical observations, patient history, and epidemiological information. The expected result is Negative.  Fact Sheet for Patients:  EntrepreneurPulse.com.au  Fact Sheet for Healthcare Providers:  IncredibleEmployment.be  This test is no t yet approved or cleared by the [place] FDA and  has been authorized for detection and/or diagnosis of SARS-CoV-2 by FDA under an Emergency Use Authorization (EUA). This EUA will remain  in effect (meaning this test can be used) for the duration of the COVID-19 declaration under Section 564(b)(1) of the Act, 21 U.S.C.section 360bbb-3(b)(1), unless the authorization is terminated  or revoked sooner.       Influenza A by PCR NEGATIVE NEGATIVE   Influenza B by PCR NEGATIVE NEGATIVE    Comment: (NOTE) The Xpert Xpress SARS-CoV-2/FLU/RSV plus assay is intended as an aid in the diagnosis of influenza from Nasopharyngeal swab specimens and should not be used as a sole basis for treatment. Nasal washings and aspirates are unacceptable for Xpert Xpress SARS-CoV-2/FLU/RSV testing.  Fact Sheet for Patients: EntrepreneurPulse.com.au  Fact Sheet for Healthcare Providers: IncredibleEmployment.be  This test is not yet approved or cleared by the [place] FDA and has been authorized for detection and/or diagnosis of SARS-CoV-2 by FDA under an Emergency Use Authorization (EUA). This EUA will remain in effect (meaning this test can be used) for the duration of the COVID-19 declaration under Section 564(b)(1) of the Act, 21 U.S.C. section 360bbb-3(b)(1), unless the authorization is terminated or revoked.  Performed at [facility], [place] [address], [place], [place] [address]   Comprehensive metabolic panel     Status: Abnormal   Collection Time:  04/08/21  3:13 AM  Result Value Ref Range   Sodium 143 135 - 145 mmol/L   Potassium 4.3 3.5 - 5.1 mmol/L   Chloride 104 98 - 111 mmol/L   CO2 26 22 - 32 mmol/L   Glucose, Bld 157 (H) 70 - 99 mg/dL    Comment: Glucose reference range applies only to samples taken after fasting for at least 8 hours.   BUN 35 (H) 8 - 23 mg/dL   Creatinine, Ser 2.76 (H) 0.61 - 1.24 mg/dL   Calcium 8.8 (L) 8.9 - 10.3 mg/dL   Total Protein 6.1 (L) 6.5 - 8.1 g/dL   Albumin 3.0 (L) 3.5 - 5.0 g/dL   AST 28 15 - 41 U/L   ALT 27 0 - 44 U/L   Alkaline Phosphatase 81 38 - 126 U/L   Total Bilirubin 1.9 (H) 0.3 -  1.2 mg/dL   GFR, Estimated 24 (L) >60 mL/min    Comment: (NOTE) Calculated using the CKD-EPI Creatinine Equation (2021)    Anion gap 13 5 - 15    Comment: Performed at [facility], [place] [address], [place], [place] [address]  Lipase, blood     Status: None   Collection Time: 04/08/21  3:13 AM  Result Value Ref Range   Lipase 25 11 - 51 U/L    Comment: Performed at [facility] [address], [place], [place] [address]  CBC with Diff     Status: Abnormal   Collection Time: 04/08/21  3:13 AM  Result Value Ref Range   WBC 5.3 4.0 - 10.5 K/uL   RBC 5.90 (H) 4.22 - 5.81 MIL/uL   Hemoglobin 17.8 (H) 13.0 - 17.0 g/dL   HCT 57.7 (H) 39.0 - 52.0 %   MCV 97.8 80.0 - 100.0 fL   MCH 30.2 26.0 - 34.0 pg   MCHC 30.8 30.0 - 36.0 g/dL   RDW 17.0 (H) 11.5 - 15.5 %   Platelets 150 150 - 400 K/uL   nRBC 0.0 0.0 - 0.2 %   Neutrophils Relative % 77 %   Neutro Abs 4.1 1.7 - 7.7 K/uL   Lymphocytes Relative 15 %   Lymphs Abs 0.8 0.7 - 4.0 K/uL   Monocytes Relative 7 %   Monocytes Absolute 0.4 0.1 - 1.0 K/uL   Eosinophils Relative 0 %   Eosinophils Absolute 0.0 0.0 - 0.5 K/uL   Basophils Relative 0 %   Basophils Absolute 0.0 0.0 - 0.1 K/uL   Immature Granulocytes 1 %   Abs Immature Granulocytes 0.03 0.00 - 0.07 K/uL    Comment: Performed at [facility] [address], [place], [place]  [address]  Troponin I (High Sensitivity)     Status: Abnormal   Collection Time: 04/08/21  3:13 AM  Result Value Ref Range   Troponin I (High Sensitivity) 29 (H) <18 ng/L    Comment: (NOTE) Elevated high sensitivity troponin I (hsTnI) values and significant  changes across serial measurements may suggest ACS but many other  chronic and acute conditions are known to elevate hsTnI results.  Refer to the "Links" section for chest pain algorithms and additional  guidance. Performed at [facility], [place] [address] [place], [place] [address]   Protime-INR     Status: Abnormal   Collection Time: 04/08/21  3:24 AM  Result Value Ref Range   Prothrombin Time 16.1 (H) 11.4 - 15.2 seconds    Comment: SPECIMEN COLLECTED IN ANTICOAGULANT ADJUSTED TUBE DUE TO ELEVATED HEMATOCRIT   INR 1.3 (H) 0.8 - 1.2    Comment: (NOTE) INR goal varies based on device and disease states. Performed at [facility], [place] [address], [place], [place] [address]   APTT     Status: Abnormal   Collection Time: 04/08/21  3:24 AM  Result Value Ref Range   aPTT 39 (H) 24 - 36 seconds    Comment:        IF BASELINE aPTT IS ELEVATED, SUGGEST PATIENT RISK ASSESSMENT BE USED TO DETERMINE APPROPRIATE ANTICOAGULANT THERAPY. Performed at [facility], [place] [address], [place], [place] [address]   Brain natriuretic peptide     Status: Abnormal   Collection Time: 04/08/21  3:25 AM  Result Value Ref Range   B Natriuretic Peptide 2,679.7 (H) 0.0 - 100.0 pg/mL    Comment:  Performed at [facility], [place] [address], [place], [place] [address]  Lactic acid, plasma     Status: Abnormal   Collection Time: 04/08/21  3:40 AM  Result Value Ref Range   Lactic Acid, Venous 2.1 (HH) 0.5 - 1.9 mmol/L    Comment: CRITICAL RESULT CALLED TO, READ BACK BY AND VERIFIED WITHGari Crown RN [id number] 04/08/21 Wiliam Ke Performed at [facility], [place] [address], [place], [place] [address]   POC occult blood, ED      Status: None   Collection Time: 04/08/21  4:00 AM  Result Value Ref Range   Fecal Occult Bld NEGATIVE NEGATIVE  Lactic acid, plasma     Status: Abnormal   Collection Time: 04/08/21  5:11 AM  Result Value Ref Range   Lactic Acid, Venous 2.1 (HH) 0.5 - 1.9 mmol/L    Comment: CRITICAL VALUE NOTED.  VALUE IS CONSISTENT WITH PREVIOUSLY REPORTED AND CALLED VALUE. Performed at [facility], [place] [address], [place], [place] [address]   Troponin I (High Sensitivity)     Status: Abnormal   Collection Time: 04/08/21  5:11 AM  Result Value Ref Range   Troponin I (High Sensitivity) 22 (H) <18 ng/L    Comment: (NOTE) Elevated high sensitivity troponin I (hsTnI) values and significant  changes across serial measurements may suggest ACS but many other  chronic and acute conditions are known to elevate hsTnI results.  Refer to the "Links" section for chest pain algorithms and additional  guidance. Performed at [facility], [place] [address], [place], [place] [address]   I-stat chem 8, ED (not at [facility] or [facility])     Status: Abnormal   Collection Time: 04/08/21  5:19 AM  Result Value Ref Range   Sodium 140 135 - 145 mmol/L   Potassium 4.0 3.5 - 5.1 mmol/L   Chloride 112 (H) 98 - 111 mmol/L   BUN 36 (H) 8 - 23 mg/dL   Creatinine, Ser 2.50 (H) 0.61 - 1.24 mg/dL   Glucose, Bld 128 (H) 70 - 99 mg/dL    Comment: Glucose reference range applies only to samples taken after fasting for at least 8 hours.   Calcium, Ion 0.73 (LL) 1.15 - 1.40 mmol/L   TCO2 21 (L) 22 - 32 mmol/L   Hemoglobin 18.4 (H) 13.0 - 17.0 g/dL   HCT 54.0 (H) 39.0 - 52.0 %   Comment NOTIFIED PHYSICIAN    CT ABDOMEN PELVIS WO CONTRAST  Addendum Date: 04/08/2021   ADDENDUM REPORT: 04/08/2021 06:32 ADDENDUM: As mentioned in the body of the report in the upper pole of the left kidney (axial image 75 of series 3) there is a 2.7 x 2.6 cm low-attenuation lesion which has a focus of internal calcification, incompletely  characterized on today's noncontrast CT examination. This should be further characterized with follow-up nonemergent abdominal MRI with and without IV gadolinium at some point in the near future (but after resolution of the patient's acute illness such that the patient will be adequately able to remain still and hold breath during the MRI examination). Electronically Signed   By: Vinnie Langton M.D.   On: 04/08/2021 06:32   Result Date: 04/08/2021 CLINICAL DATA:  70 year old male with history of chest pain and shortness of breath. Flank pain. Suspected kidney stone. EXAM: CT CHEST, ABDOMEN AND PELVIS WITHOUT CONTRAST TECHNIQUE: Multidetector CT imaging of the chest, abdomen and pelvis was performed following the standard protocol without IV contrast. COMPARISON:  No priors. FINDINGS: CT CHEST FINDINGS Cardiovascular: Heart size is mildly enlarged. There is no significant pericardial fluid, thickening or pericardial calcification. There is aortic atherosclerosis, as well as atherosclerosis of the great vessels of the mediastinum and the coronary arteries, including calcified atherosclerotic plaque in the left main, left anterior descending, left circumflex and right coronary arteries. Severe calcifications of the aortic valve. Left-sided pacemaker/AICD with lead tips terminating in the right atrium, proximal right ventricle and right ventricular apex. Mediastinum/Nodes: No pathologically enlarged mediastinal or hilar lymph nodes. Esophagus is unremarkable in appearance. No axillary lymphadenopathy. Lungs/Pleura: Diffuse interlobular septal thickening is noted throughout the lungs bilaterally, suggesting a background of mild interstitial pulmonary edema. There are dependent areas of atelectasis and airspace consolidation in the lower lobes of the lungs bilaterally. Trace bilateral pleural effusions. Musculoskeletal: There are no aggressive appearing lytic or blastic lesions noted in the visualized portions of the  skeleton. CT ABDOMEN PELVIS FINDINGS Hepatobiliary: Mild diffuse low attenuation throughout the hepatic parenchyma, indicative of a background of mild hepatic steatosis. Unenhanced appearance of the gallbladder is normal. Pancreas: No definite pancreatic mass or peripancreatic fluid collections or inflammatory changes are noted on today's noncontrast CT examination. Spleen: Unremarkable. Adrenals/Urinary Tract: No calcifications are identified within the collecting system of either kidney, along the course of either ureter, or within the lumen of the urinary bladder. In the upper pole of the left kidney (axial image 75 of series 3) there is a 2.7 x 2.6 cm low-attenuation lesion which has a focus of internal calcification, incompletely characterized on today's noncontrast CT examination. 2.2 cm low-attenuation lesion in the posterior aspect of the interpolar region of the left kidney is also not characterized, but statistically likely to represent a cyst. Right kidney and bilateral adrenal glands are normal in appearance. No hydroureteronephrosis. Urinary bladder is normal in appearance. Stomach/Bowel: Along the lesser curvature of the stomach in the region of the antrum (axial image 78 of series 3, coronal image 22 of series 5 and sagittal image 63 of series 6) there is a tiny locule of gas which appears to extend into the gastric wall, which could suggest an ulcer. There is also some fluid adjacent to the greater curvature of the proximal stomach, where there is also a small internal locule of gas which appears to be extraluminal (axial image 62 of series 3 and coronal image 46 of series 5). No pathologic dilatation of small bowel or colon. Normal appendix. Vascular/Lymphatic: Aortic atherosclerosis. No lymphadenopathy noted in the abdomen or pelvis. Reproductive: Prostate gland and seminal vesicles are unremarkable in appearance. Other: Small volume of ascites most evident adjacent to the liver and spleen. Small  amount of pneumoperitoneum. This includes the small amount of gas adjacent to the greater curvature of the stomach, as well as a small amount of gas in the subhepatic regions in the region of the hepatic hilum best appreciated on axial images 62-65 of series 3. Musculoskeletal: There are no aggressive appearing lytic or blastic lesions noted in the visualized portions of the skeleton. IMPRESSION: 1. Findings are highly concerning for perforated bowel. Specifically, the stomach appears abnormal, with potential gastric ulcer in the region of the antrum of the stomach along the lesser curvature superiorly. There is a small amount of perigastric fluid and trace volume of pneumoperitoneum in the upper abdomen, as detailed above. Correlation with endoscopy is suggested. 2. Dependent areas of atelectasis and consolidation in the lower lobes of the lungs bilaterally, the appearance of which suggests sequela of mild  aspiration. 3. Small volume of ascites. 4. Hepatic steatosis. 5. Mild cardiomegaly. The appearance of the lungs suggests mild interstitial pulmonary edema, which may suggest mild congestive heart failure. 6. Aortic atherosclerosis, in addition to left main and 3 vessel coronary artery disease. Assessment for potential risk factor modification, dietary therapy or pharmacologic therapy may be warranted, if clinically indicated. 7. There are calcifications of the aortic valve. Echocardiographic correlation for evaluation of potential valvular dysfunction may be warranted if clinically indicated. 8. Additional incidental findings, as above. Critical Value/emergent results were called by telephone at the time of interpretation on 04/08/2021 at 6:22 am to provider [facility], who verbally acknowledged these results. Electronically Signed: By: Vinnie Langton M.D. On: 04/08/2021 06:27   CT Chest Wo Contrast  Addendum Date: 04/08/2021   ADDENDUM REPORT: 04/08/2021 06:32 ADDENDUM: As mentioned in the body of the  report in the upper pole of the left kidney (axial image 75 of series 3) there is a 2.7 x 2.6 cm low-attenuation lesion which has a focus of internal calcification, incompletely characterized on today's noncontrast CT examination. This should be further characterized with follow-up nonemergent abdominal MRI with and without IV gadolinium at some point in the near future (but after resolution of the patient's acute illness such that the patient will be adequately able to remain still and hold breath during the MRI examination). Electronically Signed   By: Vinnie Langton M.D.   On: 04/08/2021 06:32   Result Date: 04/08/2021 CLINICAL DATA:  70 year old male with history of chest pain and shortness of breath. Flank pain. Suspected kidney stone. EXAM: CT CHEST, ABDOMEN AND PELVIS WITHOUT CONTRAST TECHNIQUE: Multidetector CT imaging of the chest, abdomen and pelvis was performed following the standard protocol without IV contrast. COMPARISON:  No priors. FINDINGS: CT CHEST FINDINGS Cardiovascular: Heart size is mildly enlarged. There is no significant pericardial fluid, thickening or pericardial calcification. There is aortic atherosclerosis, as well as atherosclerosis of the great vessels of the mediastinum and the coronary arteries, including calcified atherosclerotic plaque in the left main, left anterior descending, left circumflex and right coronary arteries. Severe calcifications of the aortic valve. Left-sided pacemaker/AICD with lead tips terminating in the right atrium, proximal right ventricle and right ventricular apex. Mediastinum/Nodes: No pathologically enlarged mediastinal or hilar lymph nodes. Esophagus is unremarkable in appearance. No axillary lymphadenopathy. Lungs/Pleura: Diffuse interlobular septal thickening is noted throughout the lungs bilaterally, suggesting a background of mild interstitial pulmonary edema. There are dependent areas of atelectasis and airspace consolidation in the lower lobes  of the lungs bilaterally. Trace bilateral pleural effusions. Musculoskeletal: There are no aggressive appearing lytic or blastic lesions noted in the visualized portions of the skeleton. CT ABDOMEN PELVIS FINDINGS Hepatobiliary: Mild diffuse low attenuation throughout the hepatic parenchyma, indicative of a background of mild hepatic steatosis. Unenhanced appearance of the gallbladder is normal. Pancreas: No definite pancreatic mass or peripancreatic fluid collections or inflammatory changes are noted on today's noncontrast CT examination. Spleen: Unremarkable. Adrenals/Urinary Tract: No calcifications are identified within the collecting system of either kidney, along the course of either ureter, or within the lumen of the urinary bladder. In the upper pole of the left kidney (axial image 75 of series 3) there is a 2.7 x 2.6 cm low-attenuation lesion which has a focus of internal calcification, incompletely characterized on today's noncontrast CT examination. 2.2 cm low-attenuation lesion in the posterior aspect of the interpolar region of the left kidney is also not characterized, but statistically likely to represent a cyst. Right  kidney and bilateral adrenal glands are normal in appearance. No hydroureteronephrosis. Urinary bladder is normal in appearance. Stomach/Bowel: Along the lesser curvature of the stomach in the region of the antrum (axial image 78 of series 3, coronal image 22 of series 5 and sagittal image 63 of series 6) there is a tiny locule of gas which appears to extend into the gastric wall, which could suggest an ulcer. There is also some fluid adjacent to the greater curvature of the proximal stomach, where there is also a small internal locule of gas which appears to be extraluminal (axial image 62 of series 3 and coronal image 46 of series 5). No pathologic dilatation of small bowel or colon. Normal appendix. Vascular/Lymphatic: Aortic atherosclerosis. No lymphadenopathy noted in the abdomen  or pelvis. Reproductive: Prostate gland and seminal vesicles are unremarkable in appearance. Other: Small volume of ascites most evident adjacent to the liver and spleen. Small amount of pneumoperitoneum. This includes the small amount of gas adjacent to the greater curvature of the stomach, as well as a small amount of gas in the subhepatic regions in the region of the hepatic hilum best appreciated on axial images 62-65 of series 3. Musculoskeletal: There are no aggressive appearing lytic or blastic lesions noted in the visualized portions of the skeleton. IMPRESSION: 1. Findings are highly concerning for perforated bowel. Specifically, the stomach appears abnormal, with potential gastric ulcer in the region of the antrum of the stomach along the lesser curvature superiorly. There is a small amount of perigastric fluid and trace volume of pneumoperitoneum in the upper abdomen, as detailed above. Correlation with endoscopy is suggested. 2. Dependent areas of atelectasis and consolidation in the lower lobes of the lungs bilaterally, the appearance of which suggests sequela of mild aspiration. 3. Small volume of ascites. 4. Hepatic steatosis. 5. Mild cardiomegaly. The appearance of the lungs suggests mild interstitial pulmonary edema, which may suggest mild congestive heart failure. 6. Aortic atherosclerosis, in addition to left main and 3 vessel coronary artery disease. Assessment for potential risk factor modification, dietary therapy or pharmacologic therapy may be warranted, if clinically indicated. 7. There are calcifications of the aortic valve. Echocardiographic correlation for evaluation of potential valvular dysfunction may be warranted if clinically indicated. 8. Additional incidental findings, as above. Critical Value/emergent results were called by telephone at the time of interpretation on 04/08/2021 at 6:22 am to provider [facility], who verbally acknowledged these results. Electronically Signed: By:  Vinnie Langton M.D. On: 04/08/2021 06:27   DG Chest Portable 1 View  Result Date: 04/08/2021 CLINICAL DATA:  Vomiting for several days EXAM: PORTABLE CHEST 1 VIEW COMPARISON:  03/13/2021 FINDINGS: Check shadow is enlarged. Defibrillator is again noted and stable. The lungs are well aerated bilaterally with the exception of the left base with left retrocardiac density and small left effusion. IMPRESSION: Left basilar opacity with small effusion. Electronically Signed   By: Inez Catalina M.D.   On: 04/08/2021 03:49      Assessment/Plan Pneumoperitoneum, possible gastric perforation  - CT with concern for above - patient exam actually very benign with no abdominal ttp and no distention  - pt is on levo but is alert and oriented  - will follow very closely but no indication for emergent surgical intervention this morning  - if patient looking stable later this AM will consider UGI to evaluate for possible contained perforation. If looking less stable or having more abdominal pain, may require operative exploration  - continue PPI gtt and IV  abx, continue NPO Septic shock - secondary to above, on levophed, BP remains soft  Dehydration - appears hemo-concentrated, rehydration per primary/cards  FEN: NPO, PPI gtt VTE: SCDs ID: [email or web address]  CKD stage III HFrEF s/p AICD - EF <20% 10/10/20 Atrial Fibrillation on Eliquis - LD yesterday around 12 PM, took a half dose T2DM Hx of HTN Tobacco abuse  Norm Parcel, [facility] 04/08/2021, 8:16 AM Please see Amion for pager number during day hours 7:00am-4:30pm

## 2021-04-08 NOTE — Sepsis Progress Note (Signed)
[facility] following for Sepsis Protocol 

## 2021-04-08 NOTE — Consult Note (Addendum)
Advanced Heart Failure Team Consult Note   Primary Physician: Vivi Barrack, MD PCP-Cardiologist:  Dr. Aundra Dubin  EP: Dr. Rayann Heman  Reason for Consultation: Pre-operative Evaluation for Surgical Clearance; Chronic Systolic Heart Failure   HPI:    Anthony Rubio. is seen today for evaluation of Pre-operative Evaluation for Surgical Clearance; Chronic Systolic Heart, at the request of Dr. Tamala Julian, PCCM.   70 y/o male w/ CAD, mixed ischemic/nonischemic cardiomyopathy, atrial fibrillation on Eliqius, and type 2 diabetes.   Last LHC here at [facility] was in 2011, which showed large, ectatic coronaries with slow flow and moderate to severe diffuse distal vessel disease (consistent with diabetes) without good interventional options. Treated medically.   He went into atrial fibrillation in 2/22 with increased RV pacing and worsening CHF.  Underwent TEE guided DCCV 4/22. TEE showed EF < 20%, no thrombus, moderately decreased RV systolic function, moderate RV enlargement, moderate TR, functionally bicuspid aortic valve with mild AI (no AS). Underwent successful upgrade dual-chamber ICD to a biventricular ICD on 03/13/21.   Now admitted w/ septic shock in the setting of perforated bowel. Needs surgery. On NE 9 + cefepime, Vanc and Flagyl. Blood cultures pending. AF. WBC 5.3. Lactic acid 2.1. No central access. NE running peripherally.   Hs trop flat and low level, 29>>22. EKG shows dual paced rhythm, 70 bpm. QT/QTcB 564/609 ms.  He denies any recent CP but poor functional status at baseline. Chronically NYHA Class IIIb.   N/V and abdominal improved, currently pain free.    Echo (TEE) 4/22  1. Left ventricular ejection fraction, by estimation, is <20%. The left ventricle has severely decreased function. The left ventricle demonstrates global hypokinesis. The left ventricular internal cavity size was severely dilated. No LV thrombus noted. 2. Right ventricular systolic function is moderately reduced. The  right ventricular size is moderately enlarged. 3. Left atrial size was mildly dilated. No left atrial/left atrial appendage thrombus was detected. 4. Right atrial size was mildly dilated. 5. No PFO/ASD by color doppler. 6. Peak RV-RA gradient 38 mmHg. Tricuspid valve regurgitation is moderate. 7. The mitral valve is normal in structure. Mild mitral valve regurgitation. No evidence of mitral stenosis. 8. Functionally bicuspid aortic valve with fused left and right coronary cusps. Mild AI, there does not appear to be significant aortic stenosis (mean gradient 3 mmHg). 9. Normal caliber thoracic aorta with mild plaque.  Review of Systems: [y] = yes, '[ ]'$  = no   General: Weight gain '[ ]'$ ; Weight loss '[ ]'$ ; Anorexia '[ ]'$ ; Fatigue [Y ]; Fever '[ ]'$ ; Chills '[ ]'$ ; Weakness [ Y]  Cardiac: Chest pain/pressure '[ ]'$ ; Resting SOB '[ ]'$ ; Exertional SOB [Y ]; Orthopnea '[ ]'$ ; Pedal Edema '[ ]'$ ; Palpitations '[ ]'$ ; Syncope '[ ]'$ ; Presyncope '[ ]'$ ; Paroxysmal nocturnal dyspnea'[ ]'$   Pulmonary: Cough '[ ]'$ ; Wheezing'[ ]'$ ; Hemoptysis'[ ]'$ ; Sputum '[ ]'$ ; Snoring '[ ]'$   GI: Vomiting[Y ]; Dysphagia'[ ]'$ ; Melena'[ ]'$ ; Hematochezia '[ ]'$ ; Heartburn'[ ]'$ ; Abdominal pain [Y ]; Constipation '[ ]'$ ; Diarrhea '[ ]'$ ; BRBPR '[ ]'$   GU: Hematuria'[ ]'$ ; Dysuria '[ ]'$ ; Nocturia'[ ]'$   Vascular: Pain in legs with walking '[ ]'$ ; Pain in feet with lying flat '[ ]'$ ; Non-healing sores '[ ]'$ ; Stroke '[ ]'$ ; TIA '[ ]'$ ; Slurred speech '[ ]'$ ;  Neuro: Headaches'[ ]'$ ; Vertigo'[ ]'$ ; Seizures'[ ]'$ ; Paresthesias'[ ]'$ ;Blurred vision '[ ]'$ ; Diplopia '[ ]'$ ; Vision changes '[ ]'$   Ortho/Skin: Arthritis '[ ]'$ ; Joint pain '[ ]'$ ; Muscle pain '[ ]'$ ; Joint swelling '[ ]'$ ;  Back Pain '[ ]'$ ; Rash '[ ]'$   Psych: Depression'[ ]'$ ; Anxiety'[ ]'$   Heme: Bleeding problems '[ ]'$ ; Clotting disorders '[ ]'$ ; Anemia '[ ]'$   Endocrine: Diabetes [ Y]; Thyroid dysfunction'[ ]'$   Home Medications Prior to Admission medications   Medication Sig Start Date End Date Taking? Authorizing Provider  amiodarone (PACERONE) 200 MG tablet Take 1 tablet (200 mg total) by mouth  daily. 01/03/21  Yes Allred, Jeneen Rinks, MD  apixaban (ELIQUIS) 5 MG TABS tablet Take 1 tablet (5 mg total) by mouth 2 (two) times daily. 09/23/20  Yes Larey Dresser, MD  atorvastatin (LIPITOR) 80 MG tablet Take 80 mg by mouth daily.   Yes [provider]  carvedilol (COREG) 6.25 MG tablet Take 2 tablets (12.5 mg total) by mouth 2 (two) times daily with a meal. Patient taking differently: Take 18.75 mg by mouth 2 (two) times daily with a meal. 09/30/20  Yes Tillery, Satira Mccallum, PA-C  dapagliflozin propanediol (FARXIGA) 10 MG TABS tablet Take 10 mg by mouth daily.   Yes [provider]  ezetimibe (ZETIA) 10 MG tablet Take 1 tablet (10 mg total) by mouth daily. 09/09/20  Yes Milford, Maricela Bo, FNP  isosorbide mononitrate (IMDUR) 30 MG 24 hr tablet Take 1/2 (one-half) tablet by mouth once daily Patient taking differently: Take 15 mg by mouth daily. 10/18/20  Yes Larey Dresser, MD  Lancets (ACCU-CHEK SOFT [facility]) lancets Use to test blood sugar once a day E11.9 Patient taking differently: Check blood sugar as needed 08/04/18  Yes Vivi Barrack, MD  torsemide (DEMADEX) 20 MG tablet Take 1 tablet (20 mg total) by mouth every other day. Patient taking differently: Take 40 mg by mouth every other day. 12/12/20  Yes Larey Dresser, MD  empagliflozin (JARDIANCE) 10 MG TABS tablet Take 1 tablet (10 mg total) by mouth daily before breakfast. Patient not taking: No sig reported 02/14/21   Larey Dresser, MD  glipiZIDE (GLUCOTROL XL) 10 MG 24 hr tablet Take 1-2 tablets (10-20 mg total) by mouth daily with breakfast. Take 2 tablets if blood sugar > 200. Patient not taking: No sig reported 02/20/19   Vivi Barrack, MD    Past Medical History: Past Medical History:  Diagnosis Date   AICD (automatic cardioverter/defibrillator) present    Aortic stenosis    a. Mild by echo 04/2011.    Arthritis    Atrial flutter ([place])    s/p DCCV 9/11   CAD (coronary artery disease)    a. LHC (5/11) with  50-60% mid LAD, diffuse mod distal LAD up to 60-70%, small OM2 subtotally occ, mod-severe diffuse distal PLOM, severe diffuse distal RCA, PDA, and PLV  disease. Started on Plavix given concern for thrombus formation in the ectatic cors with slow flow. b. Stopped Plavix after starting Coumadin for a-flutter.   CHF (congestive heart failure) ([place])    a. Likely mixed ICM/NICM. Echo (5/11) with EF 15-20%, severe global HK, mild MR, mod d/d, mildly decreased RV fcn. RHC (5/11) with mean RA 14 mmHg, PA 38/26, mean PCWP 25 mmHg, CI 1.8.  b. TEE (9/11): EF 15% no LV thrombus, no LAA thrombus. c. s/p ICD 11/11. d. Last echo 04/2011: LV severely dilated, EF 25%, diffuse HK, mild AS, mildly dilated LA, trivial pericardial effusion.   CKD (chronic kidney disease), stage III (HCC)    Complication of anesthesia    Slow to awaken   Diabetic neuropathy (HCC)    severe; with chronic gait dysfunction  Dysrhythmia    Foot ulcer, left ([place])    poor healing   GERD (gastroesophageal reflux disease)    H.pyloric   History of echocardiogram 07/2015   Echo 1/17: EF 20-25%, inf-lat AK, restrictive physio, mild AS (mean 9 mmHg), mild AI, mod MR, severe LAE, mod reduced RVSF, mild RAE, mild TR, PASP 50 mmHg   HTN (hypertension)    ICD (implantable cardiac defibrillator) in place 11/11   Medtronic dual cha,ber ICD with Optivol   Neuropathy associated with endocrine disorder ([place])    feet anad finger tips   Tobacco abuse    Uncontrolled type II diabetes mellitus     Past Surgical History: Past Surgical History:  Procedure Laterality Date   BIV UPGRADE N/A 03/13/2021   Procedure: UPGRADE TO BIVI ICD;  Surgeon: Evans Lance, MD;  Location: [place] CV LAB;  Service: Cardiovascular;  Laterality: N/A;   CARDIOVERSION N/A 08/23/2017   Procedure: CARDIOVERSION;  Surgeon: Larey Dresser, MD;  Location: [facility] ENDOSCOPY;  Service: Cardiovascular;  Laterality: N/A;   CARDIOVERSION N/A 09/12/2020   Procedure: CARDIOVERSION;   Surgeon: Larey Dresser, MD;  Location: [facility] ENDOSCOPY;  Service: Cardiovascular;  Laterality: N/A;  STAT INR!!!   CARDIOVERSION N/A 10/10/2020   Procedure: CARDIOVERSION;  Surgeon: Larey Dresser, MD;  Location: [facility] ENDOSCOPY;  Service: Cardiovascular;  Laterality: N/A;   COLONOSCOPY     COLONOSCOPY WITH PROPOFOL N/A 12/01/2017   Procedure: COLONOSCOPY WITH PROPOFOL;  Surgeon: Arta Silence, MD;  Location: [place];  Service: Endoscopy;  Laterality: N/A;   ESOPHAGOGASTRODUODENOSCOPY     ICD GENERATOR CHANGEOUT N/A 02/01/2018   Procedure: ICD GENERATOR CHANGEOUT;  Surgeon: Thompson Grayer, MD;  Location: [place] CV LAB;  Service: Cardiovascular;  Laterality: N/A;   ICD implantation  2011   by JA   TEE WITHOUT CARDIOVERSION N/A 08/23/2017   Procedure: TRANSESOPHAGEAL ECHOCARDIOGRAM (TEE);  Surgeon: Larey Dresser, MD;  Location: [facility] ENDOSCOPY;  Service: Cardiovascular;  Laterality: N/A;   TEE WITHOUT CARDIOVERSION N/A 10/10/2020   Procedure: TRANSESOPHAGEAL ECHOCARDIOGRAM (TEE);  Surgeon: Larey Dresser, MD;  Location: [facility] ENDOSCOPY;  Service: Cardiovascular;  Laterality: N/A;    Family History: Family History  Problem Relation Age of Onset   Stroke Mother    Early death Father    Congestive Heart Failure Maternal Grandmother    Gout Maternal Grandfather    Diabetes Paternal Grandfather    Cancer Paternal Grandfather    Heart attack Son    Early death Son     Social History: Social History   Socioeconomic History   Marital status: Married    Spouse name: Not on file   Number of children: Not on file   Years of education: Not on file   Highest education level: Not on file  Occupational History   Occupation: Retired Barrister's clerk   Occupation: Former Retail buyer  Tobacco Use   Smoking status: Some Days    Packs/day: 0.25    Years: 33.00    Pack years: 8.25    Types: Cigarettes   Smokeless tobacco: Never   Tobacco comments:    about 1/4 ppd. Married, lives with wife.  Pt lives in [employer] [place]. Retired Special educational needs teacher Use: Never used  Substance and Sexual Activity   Alcohol use: No   Drug use: No   Sexual activity: Not Currently  Other Topics Concern   Not on file  Social History Narrative   Not on  file   Social Determinants of Health   Financial Resource Strain: Not on file  Food Insecurity: Not on file  Transportation Needs: Not on file  Physical Activity: Not on file  Stress: Not on file  Social Connections: Not on file    Allergies:  Allergies  Allergen Reactions   Erythromycin Other (See Comments)    Toes/finger tingling   Hydralazine Other (See Comments)    Fatigue--felt terrible   Lisinopril Other (See Comments)    Higher doses=lightheadness/dizziness/feeling faint   Losartan Potassium Other (See Comments)    Extreme fatigue    Objective:    Vital Signs:   Temp:  [96.4 F (35.8 C)-99.1 F (37.3 C)] 98.1 F (36.7 C) (10/04 0851) Pulse Rate:  [68-73] 73 (10/04 0810) Resp:  [0-29] 20 (10/04 0810) BP: (65-98)/(54-81) 93/77 (10/04 0810) SpO2:  [84 %-100 %] 99 % (10/04 0810) Weight:  [90.7 kg] 90.7 kg (10/04 0331)    Weight change: Filed Weights   04/08/21 0331  Weight: 90.7 kg    Intake/Output:   Intake/Output Summary (Last 24 hours) at 04/08/2021 1052 Last data filed at 04/08/2021 0808 Gross per 24 hour  Intake 1250 ml  Output --  Net 1250 ml      Physical Exam    General:  thin and fatigued appearing elderly AAM. No resp difficulty HEENT: normal Neck: supple. JVP . Carotids 2+ bilat; no bruits. No lymphadenopathy or thyromegaly appreciated. Cor: PMI nondisplaced. Regular rate & rhythm. No rubs, gallops or murmurs. Lungs: clear Abdomen: soft, nontender, nondistended. No hepatosplenomegaly. No bruits or masses. Good bowel sounds. Extremities: no cyanosis, clubbing, rash, edema Neuro: alert & orientedx3, cranial nerves grossly intact. moves all 4 extremities w/o difficulty. Affect  pleasant   Telemetry   NSR 70s  EKG    Dual paced rhythm, 70 bpm. QT/QTcB 564/609 ms.  Labs   Basic Metabolic Panel: Recent Labs  Lab 04/08/21 0313 04/08/21 0519  NA 143 140  K 4.3 4.0  CL 104 112*  CO2 26  --   GLUCOSE 157* 128*  BUN 35* 36*  CREATININE 2.76* 2.50*  CALCIUM 8.8*  --     Liver Function Tests: Recent Labs  Lab 04/08/21 0313  AST 28  ALT 27  ALKPHOS 81  BILITOT 1.9*  PROT 6.1*  ALBUMIN 3.0*   Recent Labs  Lab 04/08/21 0313  LIPASE 25   No results for input(s): AMMONIA in the last 168 hours.  CBC: Recent Labs  Lab 04/08/21 0313 04/08/21 0519  WBC 5.3  --   NEUTROABS 4.1  --   HGB 17.8* 18.4*  HCT 57.7* 54.0*  MCV 97.8  --   PLT 150  --     Cardiac Enzymes: No results for input(s): CKTOTAL, CKMB, CKMBINDEX, TROPONINI in the last 168 hours.  BNP: BNP (last 3 results) Recent Labs    09/09/20 0952 04/08/21 0325  BNP 2,121.4* 2,679.7*    ProBNP (last 3 results) No results for input(s): PROBNP in the last 8760 hours.   CBG: Recent Labs  Lab 04/08/21 0843  GLUCAP 110*    Coagulation Studies: Recent Labs    04/08/21 0324  LABPROT 16.1*  INR 1.3*     Imaging   CT ABDOMEN PELVIS WO CONTRAST  Addendum Date: 04/08/2021   ADDENDUM REPORT: 04/08/2021 06:32 ADDENDUM: As mentioned in the body of the report in the upper pole of the left kidney (axial image 75 of series 3) there is a 2.7 x 2.6 cm low-attenuation  lesion which has a focus of internal calcification, incompletely characterized on today's noncontrast CT examination. This should be further characterized with follow-up nonemergent abdominal MRI with and without IV gadolinium at some point in the near future (but after resolution of the patient's acute illness such that the patient will be adequately able to remain still and hold breath during the MRI examination). Electronically Signed   By: Vinnie Langton M.D.   On: 04/08/2021 06:32   Result Date:  04/08/2021 CLINICAL DATA:  70 year old male with history of chest pain and shortness of breath. Flank pain. Suspected kidney stone. EXAM: CT CHEST, ABDOMEN AND PELVIS WITHOUT CONTRAST TECHNIQUE: Multidetector CT imaging of the chest, abdomen and pelvis was performed following the standard protocol without IV contrast. COMPARISON:  No priors. FINDINGS: CT CHEST FINDINGS Cardiovascular: Heart size is mildly enlarged. There is no significant pericardial fluid, thickening or pericardial calcification. There is aortic atherosclerosis, as well as atherosclerosis of the great vessels of the mediastinum and the coronary arteries, including calcified atherosclerotic plaque in the left main, left anterior descending, left circumflex and right coronary arteries. Severe calcifications of the aortic valve. Left-sided pacemaker/AICD with lead tips terminating in the right atrium, proximal right ventricle and right ventricular apex. Mediastinum/Nodes: No pathologically enlarged mediastinal or hilar lymph nodes. Esophagus is unremarkable in appearance. No axillary lymphadenopathy. Lungs/Pleura: Diffuse interlobular septal thickening is noted throughout the lungs bilaterally, suggesting a background of mild interstitial pulmonary edema. There are dependent areas of atelectasis and airspace consolidation in the lower lobes of the lungs bilaterally. Trace bilateral pleural effusions. Musculoskeletal: There are no aggressive appearing lytic or blastic lesions noted in the visualized portions of the skeleton. CT ABDOMEN PELVIS FINDINGS Hepatobiliary: Mild diffuse low attenuation throughout the hepatic parenchyma, indicative of a background of mild hepatic steatosis. Unenhanced appearance of the gallbladder is normal. Pancreas: No definite pancreatic mass or peripancreatic fluid collections or inflammatory changes are noted on today's noncontrast CT examination. Spleen: Unremarkable. Adrenals/Urinary Tract: No calcifications are  identified within the collecting system of either kidney, along the course of either ureter, or within the lumen of the urinary bladder. In the upper pole of the left kidney (axial image 75 of series 3) there is a 2.7 x 2.6 cm low-attenuation lesion which has a focus of internal calcification, incompletely characterized on today's noncontrast CT examination. 2.2 cm low-attenuation lesion in the posterior aspect of the interpolar region of the left kidney is also not characterized, but statistically likely to represent a cyst. Right kidney and bilateral adrenal glands are normal in appearance. No hydroureteronephrosis. Urinary bladder is normal in appearance. Stomach/Bowel: Along the lesser curvature of the stomach in the region of the antrum (axial image 78 of series 3, coronal image 22 of series 5 and sagittal image 63 of series 6) there is a tiny locule of gas which appears to extend into the gastric wall, which could suggest an ulcer. There is also some fluid adjacent to the greater curvature of the proximal stomach, where there is also a small internal locule of gas which appears to be extraluminal (axial image 62 of series 3 and coronal image 46 of series 5). No pathologic dilatation of small bowel or colon. Normal appendix. Vascular/Lymphatic: Aortic atherosclerosis. No lymphadenopathy noted in the abdomen or pelvis. Reproductive: Prostate gland and seminal vesicles are unremarkable in appearance. Other: Small volume of ascites most evident adjacent to the liver and spleen. Small amount of pneumoperitoneum. This includes the small amount of gas adjacent to the greater  curvature of the stomach, as well as a small amount of gas in the subhepatic regions in the region of the hepatic hilum best appreciated on axial images 62-65 of series 3. Musculoskeletal: There are no aggressive appearing lytic or blastic lesions noted in the visualized portions of the skeleton. IMPRESSION: 1. Findings are highly concerning for  perforated bowel. Specifically, the stomach appears abnormal, with potential gastric ulcer in the region of the antrum of the stomach along the lesser curvature superiorly. There is a small amount of perigastric fluid and trace volume of pneumoperitoneum in the upper abdomen, as detailed above. Correlation with endoscopy is suggested. 2. Dependent areas of atelectasis and consolidation in the lower lobes of the lungs bilaterally, the appearance of which suggests sequela of mild aspiration. 3. Small volume of ascites. 4. Hepatic steatosis. 5. Mild cardiomegaly. The appearance of the lungs suggests mild interstitial pulmonary edema, which may suggest mild congestive heart failure. 6. Aortic atherosclerosis, in addition to left main and 3 vessel coronary artery disease. Assessment for potential risk factor modification, dietary therapy or pharmacologic therapy may be warranted, if clinically indicated. 7. There are calcifications of the aortic valve. Echocardiographic correlation for evaluation of potential valvular dysfunction may be warranted if clinically indicated. 8. Additional incidental findings, as above. Critical Value/emergent results were called by telephone at the time of interpretation on 04/08/2021 at 6:22 am to provider [facility], who verbally acknowledged these results. Electronically Signed: By: Vinnie Langton M.D. On: 04/08/2021 06:27   CT Chest Wo Contrast  Addendum Date: 04/08/2021   ADDENDUM REPORT: 04/08/2021 06:32 ADDENDUM: As mentioned in the body of the report in the upper pole of the left kidney (axial image 75 of series 3) there is a 2.7 x 2.6 cm low-attenuation lesion which has a focus of internal calcification, incompletely characterized on today's noncontrast CT examination. This should be further characterized with follow-up nonemergent abdominal MRI with and without IV gadolinium at some point in the near future (but after resolution of the patient's acute illness such that the  patient will be adequately able to remain still and hold breath during the MRI examination). Electronically Signed   By: Vinnie Langton M.D.   On: 04/08/2021 06:32   Result Date: 04/08/2021 CLINICAL DATA:  70 year old male with history of chest pain and shortness of breath. Flank pain. Suspected kidney stone. EXAM: CT CHEST, ABDOMEN AND PELVIS WITHOUT CONTRAST TECHNIQUE: Multidetector CT imaging of the chest, abdomen and pelvis was performed following the standard protocol without IV contrast. COMPARISON:  No priors. FINDINGS: CT CHEST FINDINGS Cardiovascular: Heart size is mildly enlarged. There is no significant pericardial fluid, thickening or pericardial calcification. There is aortic atherosclerosis, as well as atherosclerosis of the great vessels of the mediastinum and the coronary arteries, including calcified atherosclerotic plaque in the left main, left anterior descending, left circumflex and right coronary arteries. Severe calcifications of the aortic valve. Left-sided pacemaker/AICD with lead tips terminating in the right atrium, proximal right ventricle and right ventricular apex. Mediastinum/Nodes: No pathologically enlarged mediastinal or hilar lymph nodes. Esophagus is unremarkable in appearance. No axillary lymphadenopathy. Lungs/Pleura: Diffuse interlobular septal thickening is noted throughout the lungs bilaterally, suggesting a background of mild interstitial pulmonary edema. There are dependent areas of atelectasis and airspace consolidation in the lower lobes of the lungs bilaterally. Trace bilateral pleural effusions. Musculoskeletal: There are no aggressive appearing lytic or blastic lesions noted in the visualized portions of the skeleton. CT ABDOMEN PELVIS FINDINGS Hepatobiliary: Mild diffuse low attenuation throughout the  hepatic parenchyma, indicative of a background of mild hepatic steatosis. Unenhanced appearance of the gallbladder is normal. Pancreas: No definite pancreatic mass or  peripancreatic fluid collections or inflammatory changes are noted on today's noncontrast CT examination. Spleen: Unremarkable. Adrenals/Urinary Tract: No calcifications are identified within the collecting system of either kidney, along the course of either ureter, or within the lumen of the urinary bladder. In the upper pole of the left kidney (axial image 75 of series 3) there is a 2.7 x 2.6 cm low-attenuation lesion which has a focus of internal calcification, incompletely characterized on today's noncontrast CT examination. 2.2 cm low-attenuation lesion in the posterior aspect of the interpolar region of the left kidney is also not characterized, but statistically likely to represent a cyst. Right kidney and bilateral adrenal glands are normal in appearance. No hydroureteronephrosis. Urinary bladder is normal in appearance. Stomach/Bowel: Along the lesser curvature of the stomach in the region of the antrum (axial image 78 of series 3, coronal image 22 of series 5 and sagittal image 63 of series 6) there is a tiny locule of gas which appears to extend into the gastric wall, which could suggest an ulcer. There is also some fluid adjacent to the greater curvature of the proximal stomach, where there is also a small internal locule of gas which appears to be extraluminal (axial image 62 of series 3 and coronal image 46 of series 5). No pathologic dilatation of small bowel or colon. Normal appendix. Vascular/Lymphatic: Aortic atherosclerosis. No lymphadenopathy noted in the abdomen or pelvis. Reproductive: Prostate gland and seminal vesicles are unremarkable in appearance. Other: Small volume of ascites most evident adjacent to the liver and spleen. Small amount of pneumoperitoneum. This includes the small amount of gas adjacent to the greater curvature of the stomach, as well as a small amount of gas in the subhepatic regions in the region of the hepatic hilum best appreciated on axial images 62-65 of series 3.  Musculoskeletal: There are no aggressive appearing lytic or blastic lesions noted in the visualized portions of the skeleton. IMPRESSION: 1. Findings are highly concerning for perforated bowel. Specifically, the stomach appears abnormal, with potential gastric ulcer in the region of the antrum of the stomach along the lesser curvature superiorly. There is a small amount of perigastric fluid and trace volume of pneumoperitoneum in the upper abdomen, as detailed above. Correlation with endoscopy is suggested. 2. Dependent areas of atelectasis and consolidation in the lower lobes of the lungs bilaterally, the appearance of which suggests sequela of mild aspiration. 3. Small volume of ascites. 4. Hepatic steatosis. 5. Mild cardiomegaly. The appearance of the lungs suggests mild interstitial pulmonary edema, which may suggest mild congestive heart failure. 6. Aortic atherosclerosis, in addition to left main and 3 vessel coronary artery disease. Assessment for potential risk factor modification, dietary therapy or pharmacologic therapy may be warranted, if clinically indicated. 7. There are calcifications of the aortic valve. Echocardiographic correlation for evaluation of potential valvular dysfunction may be warranted if clinically indicated. 8. Additional incidental findings, as above. Critical Value/emergent results were called by telephone at the time of interpretation on 04/08/2021 at 6:22 am to provider [facility], who verbally acknowledged these results. Electronically Signed: By: Vinnie Langton M.D. On: 04/08/2021 06:27   DG Chest Portable 1 View  Result Date: 04/08/2021 CLINICAL DATA:  Vomiting for several days EXAM: PORTABLE CHEST 1 VIEW COMPARISON:  03/13/2021 FINDINGS: Check shadow is enlarged. Defibrillator is again noted and stable. The lungs are well aerated bilaterally  with the exception of the left base with left retrocardiac density and small left effusion. IMPRESSION: Left basilar opacity with  small effusion. Electronically Signed   By: Inez Catalina M.D.   On: 04/08/2021 03:49     Medications:     Current Medications:  [START ON 04/11/2021] pantoprazole  40 mg Intravenous Q12H    Infusions:  sodium chloride Stopped (04/08/21 0713)   ceFEPime (MAXIPIME) IV 2 g (04/08/21 1029)   metronidazole Stopped (04/08/21 0917)   norepinephrine (LEVOPHED) Adult infusion 10 mcg/min (04/08/21 [id number])   pantoprazole 8 mg/hr (04/08/21 0728)   [START ON 04/09/2021] vancomycin        Patient Profile   70 y/o male w/ CAD, mixed ischemic/nonischemic cardiomyopathy, atrial fibrillation on Eliqius, type 2 diabetes, recent worsening of HF due to increase in RV pacing, s/p recent upgrade to CRT-D 03/13/21, admitted w/ septic shock in setting of perforated bowel. [facility] consulted for preoperative evaluation/surgical clearance.   TEE 4/22: LVEF <20%, RV moderately reduced   Assessment/Plan   Septic Shock/ Perforated Bowel  - ? If 2/2 low output from severe biventricular failure  - GS following for potential surgery  - on Vanc + Flagyl + Cefepime.  - pain improving, AF. WBC nl 5.3, BCx pending  - NE for BP support  - If +BCx, will need TEE given new device lead   2. Chronic Biventricular Heart Failure  - mixed ischemic/nonischemic cardiomyopathy - recent worsening HF w/ increase in RV pacing s/p recent device upgrade to CRT-D 9/8 - TEE 4/22 EF <20%, RV moderately reduced  - Overall poor functional status, chronically NYHA Class IIIb at baseline  - He is not volume overloaded on exam. Suspect he is dry, Hold diuretics  - Update echo  - Holding HF meds/GDMT w/ hypotension requiring NE   3. CAD:  - LHC in 2011 showed large, ectatic coronaries with slow flow and moderate to severe diffuse distal vessel disease (consistent with diabetes) without good interventional options. Treated medically.  - denies any recent ischemic like CP, though poor functional status at baseline  - Not on ASA w/ Eliquis  use for PAF - hold ? blocker w/ hypotension/shock  - holding statin given NPO for perforated bowel   3. PAF:  - maintaining NSR, HR controlled   4. Pre-operative Assessment  - no recent ischemic chest pain, though poor functional status at baseline. Unable to complete > 4METs of physical activity, limited by chronic HF symptoms  - Would be high risk for perioperative complications given severe biventricular HF  - with recent device upgrade, will repeat echo to see if any improvement in LVEF  - consider PICC line placement to check co-ox to help assess for low-output/ need for further optimization w/ inotropes, if surgery cannot be avoided   5. Stage IV CKD  - SCr 2.50, c/w baseline  - monitor   Consider palliative care consult to discuss [place].   Length of Stay: 0  Lyda Jester, PA-C  04/08/2021, 10:52 AM  Advanced Heart Failure Team Pager [phone] (M-F; 7a - 5p)  Please contact [facility] for night-coverage after hours (4p -7a ) and weekends on [email or web address]  Agree with above  70 y/o male with CKD IV, CAD, severe systolic HF EF < [id number] s/p recent CRT upgrade admitted with septic shock due to possible perforated stomach.   At baseline NYHA IIIB.    On admit severe ab pain and nausea.  CT concerning for gastric perforation but WBC normal. Lactate  2.1. Was seen by GSU and exam improved rapidly. Now being treated with broad spectrum abx and watchful waiting. Pending possible UGI. We are asked to see regarding pre-op risk stratification.   Now on NE 9. SBP 90-95. Denies ab pain. N/v. No recent melena.  General:  Elderly appearing. No resp difficulty HEENT: normal Neck: supple. no JVD. Carotids 2+ bilat; no bruits. No lymphadenopathy or thryomegaly appreciated. Cor: PMI nondisplaced. Regular rate & rhythm. No rubs, gallops or murmurs. Lungs: clear Abdomen: soft, nontender, nondistended. No hepatosplenomegaly. No bruits or masses. Good bowel sounds. Extremities: no cyanosis,  clubbing, rash, edema Neuro: alert & orientedx3, cranial nerves grossly intact. moves all 4 extremities w/o difficulty. Affect pleasant  At baseline he is quite tenuous from HF standpoint with severe biventricular dysfunction and NYHA IIB symptoms. Currently well compensated from HF perspective on NE but would be at extremely high-risk for peri-op CV complications/ESRD/death if he were to require a laparotomy. That said, if he needs emergent surgery to save his life, he should go and we will be on standby to support. We will repeat echo.   CRITICAL CARE Performed by: Glori Bickers  Total critical care time: 35 minutes  Critical care time was exclusive of separately billable procedures and treating other patients.  Critical care was necessary to treat or prevent imminent or life-threatening deterioration.  Critical care was time spent personally by me (independent of midlevel providers or residents) on the following activities: development of treatment plan with patient and/or surrogate as well as nursing, discussions with consultants, evaluation of patient's response to treatment, examination of patient, obtaining history from patient or surrogate, ordering and performing treatments and interventions, ordering and review of laboratory studies, ordering and review of radiographic studies, pulse oximetry and re-evaluation of patient's condition.  Glori Bickers, MD  3:25 PM

## 2021-04-08 NOTE — Progress Notes (Signed)
Pharmacy Antibiotic Note  Anthony Rubio. is a 70 y.o. male admitted on 04/08/2021 with  septic shock d/t bowel perforation .  Pharmacy has been consulted for vancomycin and cefepime dosing.  Plan: Vancomycin '1750mg'$  given x1 in the ED; continue with '1000mg'$  IV Q24H. Goal AUC 400-550.  Expected AUC 490.  SCr 2.5.  Cefepime 2g IV Q12H.  Height: '6\' 3"'$  (190.5 cm) Weight: 90.7 kg (200 lb) IBW/kg (Calculated) : 84.5  Temp (24hrs), Avg:99.1 F (37.3 C), Min:99.1 F (37.3 C), Max:99.1 F (37.3 C)  Recent Labs  Lab 04/08/21 0313 04/08/21 0340 04/08/21 0511 04/08/21 0519  WBC 5.3  --   --   --   CREATININE 2.76*  --   --  2.50*  LATICACIDVEN  --  2.1* 2.1*  --     Estimated Creatinine Clearance: 32.4 mL/min (A) (by C-G formula based on SCr of 2.5 mg/dL (H)).    Allergies  Allergen Reactions   Erythromycin Other (See Comments)    Toes/finger tingling   Hydralazine Other (See Comments)    Fatigue--felt terrible   Lisinopril Other (See Comments)    Higher doses=lightheadness/dizziness/feeling faint   Losartan Potassium Other (See Comments)    Extreme fatigue    Thank you for allowing pharmacy to be a part of this patient's care.  Wynona Neat, PharmD, BCPS  04/08/2021 7:20 AM

## 2021-04-08 NOTE — Progress Notes (Signed)
RT Note:  CCM at bedside.  Plan to trial patient off NRB. Hold BIPAP at this time. NO distress noted.

## 2021-04-08 NOTE — H&P (Addendum)
NAME:  Anthony Rubio., MRN:  [id number], DOB:  11-19-1950, LOS: 0 ADMISSION DATE:  04/08/2021, CONSULTATION DATE:  04/08/21 REFERRING MD:  EDP, CHIEF COMPLAINT:   vomiting  History of Present Illness:  Anthony Rubio is a 70 year old male with past medical history of HFrEF with EF <20%, atrial flutter, CAD, AICD, CKD stage III, type 2 diabetes who presented to the ED with approximately 5 days of persistent vomiting.  He reports no hematemesis, His stomach has felt "sore" but no severe abdominal pain, started developing diarrhea since arrival to the ED. He denies fevers, cough, or known ill contacts.   In the ED, he was initially hypotensive which improved marginally with IV fluid.  Labs are significant for lactic acid of 2.1, no leukocytosis, creatinine at baseline.  PCCM consulted for persistent hypotension after 2 L IV fluid.   CT abdomen/pelvis concerning for perforated bowel with potential gastric ulcer in the antrum of the stomach.   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   Significant Hospital Events: Including procedures, antibiotic start and stop dates in addition to other pertinent events   10/24 admit to PCCM, concern for perforated bowel CT  Interim History / Subjective:   Patient is awake and alert, persistently hypotensive but mentating well.  Objective   Blood pressure (!) 73/63, pulse 68,  temperature 99.1 F (37.3 C), resp. rate 19, height '6\' 3"'$  (1.905 m), weight 90.7 kg, SpO2 96 %.       No intake or output data in the 24 hours ending 04/08/21 0636 Filed Weights   04/08/21 0331  Weight: 90.7 kg   General: Thin, elderly male, resting in bed in no severe distress HEENT: MM pink/moist Neuro: Awake and alert, oriented x3, moving all extremities without any focal deficits CV: s1s2 rrr, no m/r/g PULM: Seen on nonrebreather with oxygen saturations 97%, slightly decreased air movement in bilateral bases without rhonchi or wheezing GI: soft, bsx4 active  Extremities: warm/dry, no edema  Skin: no rashes or lesions   Resolved Hospital Problem list     Assessment & Plan:    Perforated bowel with likely septic shock Surgery consulted and will see patient P: -Admit to ICU, give additional 500 cc IV fluid, continue peripheral Levophed as needed, may need central line -N.p.o. -Continue Flagyl, Cefepime, Vancomycin -trend lactic acid     CKD stage III -Creatinine 2.7, near baseline -Continue to monitor renal indices, urine output, electrolytes and avoid nephrotoxins    History of HFrEF, hyperlipidemia, atrial flutter on Eliquis Most recent EF less than 20% P: -Hold Coreg, amiodarone, Farxiga and torsemide in the setting of hypotension -Hold anticoagulation as surgery consulted and may require OR    Type 2 diabetes -Hold home medications and start SSI     Best Practice (right click and "Reselect all SmartList Selections" daily)   Diet/type: NPO DVT prophylaxis: SCD GI prophylaxis: PPI Lines: N/A Foley:  N/A Code Status:  full code Last date of multidisciplinary goals of care discussion '[]'$   Labs   CBC: Recent Labs  Lab 04/08/21 0313 04/08/21  0519  WBC 5.3  --   NEUTROABS 4.1  --   HGB 17.8* 18.4*  HCT 57.7* 54.0*  MCV 97.8  --   PLT 150  --     Basic Metabolic Panel: Recent Labs  Lab 04/08/21 0313 04/08/21 0519  NA 143 140  K 4.3 4.0   CL 104 112*  CO2 26  --   GLUCOSE 157* 128*  BUN 35* 36*  CREATININE 2.76* 2.50*  CALCIUM 8.8*  --    GFR: Estimated Creatinine Clearance: 32.4 mL/min (A) (by C-G formula based on SCr of 2.5 mg/dL (H)). Recent Labs  Lab 04/08/21 0313 04/08/21 0340 04/08/21 0511  WBC 5.3  --   --   LATICACIDVEN  --  2.1* 2.1*    Liver Function Tests: Recent Labs  Lab 04/08/21 0313  AST 28  ALT 27  ALKPHOS 81  BILITOT 1.9*  PROT 6.1*  ALBUMIN 3.0*   Recent Labs  Lab 04/08/21 0313  LIPASE 25   No results for input(s): AMMONIA in the last 168 hours.  ABG    Component Value Date/Time   PHART 7.432 11/06/2009 1343   PCO2ART 35.3 11/06/2009 1343   PO2ART 75.0 (L) 11/06/2009 1343   HCO3 23.6 11/06/2009 1343   TCO2 21 (L) 04/08/2021 0519   O2SAT 95.0 11/06/2009 1343     Coagulation Profile: Recent Labs  Lab 04/08/21 0324  INR 1.3*    Cardiac Enzymes: No results for input(s): CKTOTAL, CKMB, CKMBINDEX, TROPONINI in the last 168 hours.  HbA1C: Hemoglobin A1C  Date/Time Value Ref Range Status  11/26/2020 09:40 AM 8.1 (A) 4.0 - 5.6 % Final  05/28/2020 02:58 PM 8.1 (A) 4.0 - 5.6 % Final   Hgb A1c MFr Bld  Date/Time Value Ref Range Status  02/13/2016 09:49 AM 7.7 (H) 4.6 - 6.5 % Final    Comment:    Glycemic Control Guidelines for People with Diabetes:Non Diabetic:  <6%Goal of Therapy: <7%Additional Action Suggested:  >8%   05/09/2014 10:44 AM 7.4 (H) 4.6 - 6.5 % Final    Comment:    Glycemic Control Guidelines for People with Diabetes:Non Diabetic:  <6%Goal of Therapy: <7%Additional Action Suggested:  >8%     CBG: No results for input(s): GLUCAP in the last 168 hours.  Review of Systems:   Negative except as noted in HPI  Past Medical History:  He,  has a past medical history of AICD (automatic cardioverter/defibrillator) present, Aortic stenosis, Arthritis, Atrial flutter (H[facility]), CAD (coronary artery disease), CHF (congestive heart failure) ([place]), CKD (chronic kidney  disease), stage III ([place]), Complication of anesthesia, Diabetic neuropathy ([place]), Dysrhythmia, Foot ulcer, left ([place]), GERD (gastroesophageal reflux disease), History of echocardiogram (07/2015), HTN (hypertension), ICD (implantable cardiac defibrillator) in place (11/11), Neuropathy associated with endocrine disorder ([place]), Tobacco abuse, and Uncontrolled type II diabetes mellitus.   Surgical History:   Past Surgical History:  Procedure Laterality Date   BIV UPGRADE N/A 03/13/2021   Procedure: UPGRADE TO BIVI ICD;  Surgeon: Evans Lance, MD;  Location: [place] [facility];  Service: Cardiovascular;  Laterality: N/A;   CARDIOVERSION N/A 08/23/2017   Procedure: CARDIOVERSION;  Surgeon: Larey Dresser, MD;  Location[facility] ENDOSCOPY;  Service: Cardiovascular;  Laterality: N/A;   CARDIOVERSION N/A 09/12/2020   Procedure: CARDIOVERSION;  Surgeon: Larey Dresser, MD;  Location: [facility] ENDOSCOPY;  Service: Cardiovascular;  Laterality: N/A;  STAT INR!!!   CARDIOVERSION N/A 10/10/2020   Procedure: CARDIOVERSION;  Surgeon: Larey Dresser, MD;  Location: [facility]  ENDOSCOPY;  Service: Cardiovascular;  Laterality: N/A;   COLONOSCOPY     COLONOSCOPY WITH PROPOFOL N/A 12/01/2017   Procedure: COLONOSCOPY WITH PROPOFOL;  Surgeon: Arta Silence, MD;  Location: [place];  Service: Endoscopy;  Laterality: N/A;   ESOPHAGOGASTRODUODENOSCOPY     ICD GENERATOR CHANGEOUT N/A 02/01/2018   Procedure: ICD GENERATOR CHANGEOUT;  Surgeon: Thompson Grayer, MD;  Location: [place] [facility];  Service: Cardiovascular;  Laterality: N/A;   ICD implantation  2011   by JA   TEE WITHOUT CARDIOVERSION N/A 08/23/2017   Procedure: TRANSESOPHAGEAL ECHOCARDIOGRAM (TEE);  Surgeon: Larey Dresser, MD;  Location: [facility] ENDOSCOPY;  Service: Cardiovascular;  Laterality: N/A;   TEE WITHOUT CARDIOVERSION N/A 10/10/2020   Procedure: TRANSESOPHAGEAL ECHOCARDIOGRAM (TEE);  Surgeon: Larey Dresser, MD;  Location: [facility] ENDOSCOPY;  Service: Cardiovascular;   Laterality: N/A;     Social History:   reports that he has been smoking cigarettes. He has a 8.25 pack-year smoking history. He has never used smokeless tobacco. He reports that he does not drink alcohol and does not use drugs.   Family History:  His family history includes Cancer in his paternal grandfather; Congestive Heart Failure in his maternal grandmother; Diabetes in his paternal grandfather; Early death in his father and son; Gout in his maternal grandfather; Heart attack in his son; Stroke in his mother.   Allergies Allergies  Allergen Reactions   Erythromycin Other (See Comments)    Toes/finger tingling   Hydralazine Other (See Comments)    Fatigue--felt terrible   Lisinopril Other (See Comments)    Higher doses=lightheadness/dizziness/feeling faint   Losartan Potassium Other (See Comments)    Extreme fatigue     Home Medications  Prior to Admission medications   Medication Sig Start Date End Date Taking? Authorizing Provider  amiodarone (PACERONE) 200 MG tablet Take 1 tablet (200 mg total) by mouth daily. 01/03/21  Yes Allred, Jeneen Rinks, MD  apixaban (ELIQUIS) 5 MG TABS tablet Take 1 tablet (5 mg total) by mouth 2 (two) times daily. 09/23/20  Yes Larey Dresser, MD  atorvastatin (LIPITOR) 80 MG tablet Take 80 mg by mouth daily.   Yes [provider]  carvedilol (COREG) 6.25 MG tablet Take 2 tablets (12.5 mg total) by mouth 2 (two) times daily with a meal. Patient taking differently: Take 18.75 mg by mouth 2 (two) times daily with a meal. 09/30/20  Yes Tillery, Satira Mccallum, PA-C  dapagliflozin propanediol (FARXIGA) 10 MG TABS tablet Take 10 mg by mouth daily.   Yes [provider]  ezetimibe (ZETIA) 10 MG tablet Take 1 tablet (10 mg total) by mouth daily. 09/09/20  Yes [place], Maricela Bo, FNP  isosorbide mononitrate (IMDUR) 30 MG 24 hr tablet Take 1/2 (one-half) tablet by mouth once daily Patient taking differently: Take 15 mg by mouth daily. 10/18/20  Yes  Larey Dresser, MD  Lancets (ACCU-CHEK SOFT [facility]) lancets Use to test blood sugar once a day E11.9 Patient taking differently: Check blood sugar as needed 08/04/18  Yes Vivi Barrack, MD  torsemide (DEMADEX) 20 MG tablet Take 1 tablet (20 mg total) by mouth every other day. Patient taking differently: Take 40 mg by mouth every other day. 12/12/20  Yes Larey Dresser, MD  empagliflozin (JARDIANCE) 10 MG TABS tablet Take 1 tablet (10 mg total) by mouth daily before breakfast. Patient not taking: No sig reported 02/14/21   Larey Dresser, MD  glipiZIDE (GLUCOTROL XL) 10 MG 24 hr tablet Take 1-2 tablets (10-20  mg total) by mouth daily with breakfast. Take 2 tablets if blood sugar > 200. Patient not taking: No sig reported 02/20/19   Vivi Barrack, MD     Critical care time: 45 minutes    CRITICAL CARE Performed by: Otilio Carpen Emillee Talsma   Total critical care time: 45 minutes  Critical care time was exclusive of separately billable procedures and treating other patients.  Critical care was necessary to treat or prevent imminent or life-threatening deterioration.  Critical care was time spent personally by me on the following activities: development of treatment plan with patient and/or surrogate as well as nursing, discussions with consultants, evaluation of patient's response to treatment, examination of patient, obtaining history from patient or surrogate, ordering and performing treatments and interventions, ordering and review of laboratory studies, ordering and review of radiographic studies, pulse oximetry and re-evaluation of patient's condition.  Otilio Carpen Chick Cousins, PA-C [facility] See Amion for pager If no response to pager , please call [phone] until 7pm After 7:00 pm call Elink  [phone]?[place]

## 2021-04-09 ENCOUNTER — Inpatient Hospital Stay (HOSPITAL_COMMUNITY): Payer: No Typology Code available for payment source

## 2021-04-09 ENCOUNTER — Other Ambulatory Visit (HOSPITAL_COMMUNITY): Payer: No Typology Code available for payment source

## 2021-04-09 DIAGNOSIS — A419 Sepsis, unspecified organism: Principal | ICD-10-CM

## 2021-04-09 DIAGNOSIS — J9601 Acute respiratory failure with hypoxia: Secondary | ICD-10-CM | POA: Diagnosis not present

## 2021-04-09 DIAGNOSIS — K631 Perforation of intestine (nontraumatic): Secondary | ICD-10-CM | POA: Diagnosis not present

## 2021-04-09 DIAGNOSIS — I5022 Chronic systolic (congestive) heart failure: Secondary | ICD-10-CM

## 2021-04-09 DIAGNOSIS — R6520 Severe sepsis without septic shock: Secondary | ICD-10-CM

## 2021-04-09 DIAGNOSIS — R6521 Severe sepsis with septic shock: Secondary | ICD-10-CM

## 2021-04-09 LAB — CBC
HCT: 59.6 % — ABNORMAL HIGH (ref 39.0–52.0)
Hemoglobin: 18.7 g/dL — ABNORMAL HIGH (ref 13.0–17.0)
MCH: 30.4 pg (ref 26.0–34.0)
MCHC: 31.4 g/dL (ref 30.0–36.0)
MCV: 96.8 fL (ref 80.0–100.0)
Platelets: 150 10*3/uL (ref 150–400)
RBC: 6.16 MIL/uL — ABNORMAL HIGH (ref 4.22–5.81)
RDW: 16.9 % — ABNORMAL HIGH (ref 11.5–15.5)
WBC: 12.7 10*3/uL — ABNORMAL HIGH (ref 4.0–10.5)
nRBC: 0 % (ref 0.0–0.2)

## 2021-04-09 LAB — ECHOCARDIOGRAM COMPLETE
AR max vel: 1.52 cm2
AV Area VTI: 1.52 cm2
AV Area mean vel: 1.45 cm2
AV Mean grad: 5 mmHg
AV Peak grad: 9.7 mmHg
Ao pk vel: 1.56 m/s
Area-P 1/2: 5.13 cm2
Calc EF: 18.5 %
Height: 76 in
MV M vel: 3.39 m/s
MV Peak grad: 45.8 mmHg
S' Lateral: 6.9 cm
Single Plane A2C EF: 22.1 %
Single Plane A4C EF: 15.2 %
Weight: 3291.03 oz

## 2021-04-09 LAB — PHOSPHORUS: Phosphorus: 5.7 mg/dL — ABNORMAL HIGH (ref 2.5–4.6)

## 2021-04-09 LAB — BASIC METABOLIC PANEL
Anion gap: 14 (ref 5–15)
BUN: 43 mg/dL — ABNORMAL HIGH (ref 8–23)
CO2: 23 mmol/L (ref 22–32)
Calcium: 8.6 mg/dL — ABNORMAL LOW (ref 8.9–10.3)
Chloride: 104 mmol/L (ref 98–111)
Creatinine, Ser: 2.87 mg/dL — ABNORMAL HIGH (ref 0.61–1.24)
GFR, Estimated: 23 mL/min — ABNORMAL LOW (ref >60)
Glucose, Bld: 150 mg/dL — ABNORMAL HIGH (ref 70–99)
Potassium: 4.3 mmol/L (ref 3.5–5.1)
Sodium: 141 mmol/L (ref 135–145)

## 2021-04-09 LAB — MAGNESIUM: Magnesium: 2.6 mg/dL — ABNORMAL HIGH (ref 1.7–2.4)

## 2021-04-09 LAB — COOXEMETRY PANEL
Carboxyhemoglobin: 1.3 % (ref 0.5–1.5)
Methemoglobin: 0.7 % (ref 0.0–1.5)
O2 Saturation: 49 %
Total hemoglobin: 17.3 g/dL — ABNORMAL HIGH (ref 12.0–16.0)

## 2021-04-09 LAB — GLUCOSE, CAPILLARY: Glucose-Capillary: 117 mg/dL — ABNORMAL HIGH (ref 70–99)

## 2021-04-09 LAB — LACTIC ACID, PLASMA: Lactic Acid, Venous: 2.3 mmol/L (ref 0.5–1.9)

## 2021-04-09 MED ORDER — IOHEXOL 300 MG/ML  SOLN
100.0000 mL | Freq: Once | INTRAMUSCULAR | Status: AC | PRN
  Administered 2021-04-09: 100 mL via ORAL

## 2021-04-09 MED ORDER — PIPERACILLIN-TAZOBACTAM 3.375 G IVPB
3.3750 g | Freq: Once | INTRAVENOUS | Status: AC
  Administered 2021-04-09: 3.375 g via INTRAVENOUS
  Filled 2021-04-09: qty 50

## 2021-04-09 MED ORDER — DOBUTAMINE IN D5W 4-5 MG/ML-% IV SOLN
1.5000 ug/kg/min | INTRAVENOUS | Status: DC
  Administered 2021-04-09: 2.5 ug/kg/min via INTRAVENOUS
  Filled 2021-04-09 (×2): qty 250

## 2021-04-09 MED ORDER — PIPERACILLIN-TAZOBACTAM 3.375 G IVPB
3.3750 g | Freq: Three times a day (TID) | INTRAVENOUS | Status: DC
  Administered 2021-04-09 – 2021-04-13 (×11): 3.375 g via INTRAVENOUS
  Filled 2021-04-09 (×12): qty 50

## 2021-04-09 MED ORDER — PANTOPRAZOLE SODIUM 40 MG IV SOLR
40.0000 mg | Freq: Two times a day (BID) | INTRAVENOUS | Status: DC
  Administered 2021-04-09 – 2021-04-12 (×7): 40 mg via INTRAVENOUS
  Filled 2021-04-09 (×7): qty 40

## 2021-04-09 NOTE — Progress Notes (Signed)
PHARMACY - PHYSICIAN COMMUNICATION CRITICAL VALUE ALERT - BLOOD CULTURE IDENTIFICATION (BCID)  Anthony Rubio. is an 70 y.o. male who presented to [facility] on 04/08/2021 with a chief complaint of bowel perforation.  Assessment:   Bcx: 1/4 with GPR in aerobic bottle only  Name of physician (or Provider) Contacted: Dr. Tamala Julian  Current antibiotics:  Zosyn 3.375g IV q8h  Changes to prescribed antibiotics recommended:  No changes recommended to current antibiotic regimen. Will follow up with identification.    Lestine Box, PharmD PGY2 Infectious Diseases Pharmacy Resident   Please check [email or web address] for unit-specific pharmacy phone numbers

## 2021-04-09 NOTE — Progress Notes (Signed)
1 Day Post-Op   Subjective/Chief Complaint: Denies abd pain. He is tired of being here and having people care for him. He states he is leaving today   Objective: Vital signs in last 24 hours: Temp:  [98.1 F (36.7 C)-100.2 F (37.9 C)] 98.7 F (37.1 C) (10/05 0736) Pulse Rate:  [64-78] 70 (10/05 0815) Resp:  [9-31] 19 (10/05 0815) BP: (69-159)/(48-141) 98/78 (10/05 0815) SpO2:  [85 %-100 %] 91 % (10/05 0815) FiO2 (%):  [100 %] 100 % (10/04 1000) Weight:  [93.3 kg-94.7 kg] 93.3 kg (10/05 0334) Last BM Date: 04/08/21  Intake/Output from previous day: 10/04 0701 - 10/05 0700 In: 2833.9 [I.V.:1735; IV Piggyback:1098.9] Out: 755 [Urine:755] Intake/Output this shift: No intake/output data recorded.  General appearance: alert and cooperative Resp: clear to auscultation bilaterally Cardio: regular rate and rhythm GI: soft, nontender  Lab Results:  Recent Labs    04/08/21 0313 04/08/21 0519 04/09/21 0300  WBC 5.3  --  12.7*  HGB 17.8* 18.4* 18.7*  HCT 57.7* 54.0* 59.6*  PLT 150  --  150   BMET Recent Labs    04/08/21 0313 04/08/21 0519 04/09/21 0300  NA 143 140 141  K 4.3 4.0 4.3  CL 104 112* 104  CO2 26  --  23  GLUCOSE 157* 128* 150*  BUN 35* 36* 43*  CREATININE 2.76* 2.50* 2.87*  CALCIUM 8.8*  --  8.6*   PT/INR Recent Labs    04/08/21 0324  LABPROT 16.1*  INR 1.3*   ABG No results for input(s): PHART, HCO3 in the last 72 hours.  Invalid input(s): PCO2, PO2  Studies/Results: CT ABDOMEN PELVIS WO CONTRAST  Addendum Date: 04/08/2021   ADDENDUM REPORT: 04/08/2021 06:32 ADDENDUM: As mentioned in the body of the report in the upper pole of the left kidney (axial image 75 of series 3) there is a 2.7 x 2.6 cm low-attenuation lesion which has a focus of internal calcification, incompletely characterized on today's noncontrast CT examination. This should be further characterized with follow-up nonemergent abdominal MRI with and without IV gadolinium at some  point in the near future (but after resolution of the patient's acute illness such that the patient will be adequately able to remain still and hold breath during the MRI examination). Electronically Signed   By: Vinnie Langton M.D.   On: 04/08/2021 06:32   Result Date: 04/08/2021 CLINICAL DATA:  70 year old male with history of chest pain and shortness of breath. Flank pain. Suspected kidney stone. EXAM: CT CHEST, ABDOMEN AND PELVIS WITHOUT CONTRAST TECHNIQUE: Multidetector CT imaging of the chest, abdomen and pelvis was performed following the standard protocol without IV contrast. COMPARISON:  No priors. FINDINGS: CT CHEST FINDINGS Cardiovascular: Heart size is mildly enlarged. There is no significant pericardial fluid, thickening or pericardial calcification. There is aortic atherosclerosis, as well as atherosclerosis of the great vessels of the mediastinum and the coronary arteries, including calcified atherosclerotic plaque in the left main, left anterior descending, left circumflex and right coronary arteries. Severe calcifications of the aortic valve. Left-sided pacemaker/AICD with lead tips terminating in the right atrium, proximal right ventricle and right ventricular apex. Mediastinum/Nodes: No pathologically enlarged mediastinal or hilar lymph nodes. Esophagus is unremarkable in appearance. No axillary lymphadenopathy. Lungs/Pleura: Diffuse interlobular septal thickening is noted throughout the lungs bilaterally, suggesting a background of mild interstitial pulmonary edema. There are dependent areas of atelectasis and airspace consolidation in the lower lobes of the lungs bilaterally. Trace bilateral pleural effusions. Musculoskeletal: There are no aggressive appearing  lytic or blastic lesions noted in the visualized portions of the skeleton. CT ABDOMEN PELVIS FINDINGS Hepatobiliary: Mild diffuse low attenuation throughout the hepatic parenchyma, indicative of a background of mild hepatic steatosis.  Unenhanced appearance of the gallbladder is normal. Pancreas: No definite pancreatic mass or peripancreatic fluid collections or inflammatory changes are noted on today's noncontrast CT examination. Spleen: Unremarkable. Adrenals/Urinary Tract: No calcifications are identified within the collecting system of either kidney, along the course of either ureter, or within the lumen of the urinary bladder. In the upper pole of the left kidney (axial image 75 of series 3) there is a 2.7 x 2.6 cm low-attenuation lesion which has a focus of internal calcification, incompletely characterized on today's noncontrast CT examination. 2.2 cm low-attenuation lesion in the posterior aspect of the interpolar region of the left kidney is also not characterized, but statistically likely to represent a cyst. Right kidney and bilateral adrenal glands are normal in appearance. No hydroureteronephrosis. Urinary bladder is normal in appearance. Stomach/Bowel: Along the lesser curvature of the stomach in the region of the antrum (axial image 78 of series 3, coronal image 22 of series 5 and sagittal image 63 of series 6) there is a tiny locule of gas which appears to extend into the gastric wall, which could suggest an ulcer. There is also some fluid adjacent to the greater curvature of the proximal stomach, where there is also a small internal locule of gas which appears to be extraluminal (axial image 62 of series 3 and coronal image 46 of series 5). No pathologic dilatation of small bowel or colon. Normal appendix. Vascular/Lymphatic: Aortic atherosclerosis. No lymphadenopathy noted in the abdomen or pelvis. Reproductive: Prostate gland and seminal vesicles are unremarkable in appearance. Other: Small volume of ascites most evident adjacent to the liver and spleen. Small amount of pneumoperitoneum. This includes the small amount of gas adjacent to the greater curvature of the stomach, as well as a small amount of gas in the subhepatic  regions in the region of the hepatic hilum best appreciated on axial images 62-65 of series 3. Musculoskeletal: There are no aggressive appearing lytic or blastic lesions noted in the visualized portions of the skeleton. IMPRESSION: 1. Findings are highly concerning for perforated bowel. Specifically, the stomach appears abnormal, with potential gastric ulcer in the region of the antrum of the stomach along the lesser curvature superiorly. There is a small amount of perigastric fluid and trace volume of pneumoperitoneum in the upper abdomen, as detailed above. Correlation with endoscopy is suggested. 2. Dependent areas of atelectasis and consolidation in the lower lobes of the lungs bilaterally, the appearance of which suggests sequela of mild aspiration. 3. Small volume of ascites. 4. Hepatic steatosis. 5. Mild cardiomegaly. The appearance of the lungs suggests mild interstitial pulmonary edema, which may suggest mild congestive heart failure. 6. Aortic atherosclerosis, in addition to left main and 3 vessel coronary artery disease. Assessment for potential risk factor modification, dietary therapy or pharmacologic therapy may be warranted, if clinically indicated. 7. There are calcifications of the aortic valve. Echocardiographic correlation for evaluation of potential valvular dysfunction may be warranted if clinically indicated. 8. Additional incidental findings, as above. Critical Value/emergent results were called by telephone at the time of interpretation on 04/08/2021 at 6:22 am to provider [facility], who verbally acknowledged these results. Electronically Signed: By: Vinnie Langton M.D. On: 04/08/2021 06:27   CT Chest Wo Contrast  Addendum Date: 04/08/2021   ADDENDUM REPORT: 04/08/2021 06:32 ADDENDUM: As mentioned in  the body of the report in the upper pole of the left kidney (axial image 75 of series 3) there is a 2.7 x 2.6 cm low-attenuation lesion which has a focus of internal calcification,  incompletely characterized on today's noncontrast CT examination. This should be further characterized with follow-up nonemergent abdominal MRI with and without IV gadolinium at some point in the near future (but after resolution of the patient's acute illness such that the patient will be adequately able to remain still and hold breath during the MRI examination). Electronically Signed   By: Vinnie Langton M.D.   On: 04/08/2021 06:32   Result Date: 04/08/2021 CLINICAL DATA:  70 year old male with history of chest pain and shortness of breath. Flank pain. Suspected kidney stone. EXAM: CT CHEST, ABDOMEN AND PELVIS WITHOUT CONTRAST TECHNIQUE: Multidetector CT imaging of the chest, abdomen and pelvis was performed following the standard protocol without IV contrast. COMPARISON:  No priors. FINDINGS: CT CHEST FINDINGS Cardiovascular: Heart size is mildly enlarged. There is no significant pericardial fluid, thickening or pericardial calcification. There is aortic atherosclerosis, as well as atherosclerosis of the great vessels of the mediastinum and the coronary arteries, including calcified atherosclerotic plaque in the left main, left anterior descending, left circumflex and right coronary arteries. Severe calcifications of the aortic valve. Left-sided pacemaker/AICD with lead tips terminating in the right atrium, proximal right ventricle and right ventricular apex. Mediastinum/Nodes: No pathologically enlarged mediastinal or hilar lymph nodes. Esophagus is unremarkable in appearance. No axillary lymphadenopathy. Lungs/Pleura: Diffuse interlobular septal thickening is noted throughout the lungs bilaterally, suggesting a background of mild interstitial pulmonary edema. There are dependent areas of atelectasis and airspace consolidation in the lower lobes of the lungs bilaterally. Trace bilateral pleural effusions. Musculoskeletal: There are no aggressive appearing lytic or blastic lesions noted in the visualized  portions of the skeleton. CT ABDOMEN PELVIS FINDINGS Hepatobiliary: Mild diffuse low attenuation throughout the hepatic parenchyma, indicative of a background of mild hepatic steatosis. Unenhanced appearance of the gallbladder is normal. Pancreas: No definite pancreatic mass or peripancreatic fluid collections or inflammatory changes are noted on today's noncontrast CT examination. Spleen: Unremarkable. Adrenals/Urinary Tract: No calcifications are identified within the collecting system of either kidney, along the course of either ureter, or within the lumen of the urinary bladder. In the upper pole of the left kidney (axial image 75 of series 3) there is a 2.7 x 2.6 cm low-attenuation lesion which has a focus of internal calcification, incompletely characterized on today's noncontrast CT examination. 2.2 cm low-attenuation lesion in the posterior aspect of the interpolar region of the left kidney is also not characterized, but statistically likely to represent a cyst. Right kidney and bilateral adrenal glands are normal in appearance. No hydroureteronephrosis. Urinary bladder is normal in appearance. Stomach/Bowel: Along the lesser curvature of the stomach in the region of the antrum (axial image 78 of series 3, coronal image 22 of series 5 and sagittal image 63 of series 6) there is a tiny locule of gas which appears to extend into the gastric wall, which could suggest an ulcer. There is also some fluid adjacent to the greater curvature of the proximal stomach, where there is also a small internal locule of gas which appears to be extraluminal (axial image 62 of series 3 and coronal image 46 of series 5). No pathologic dilatation of small bowel or colon. Normal appendix. Vascular/Lymphatic: Aortic atherosclerosis. No lymphadenopathy noted in the abdomen or pelvis. Reproductive: Prostate gland and seminal vesicles are unremarkable in appearance. Other:  Small volume of ascites most evident adjacent to the liver and  spleen. Small amount of pneumoperitoneum. This includes the small amount of gas adjacent to the greater curvature of the stomach, as well as a small amount of gas in the subhepatic regions in the region of the hepatic hilum best appreciated on axial images 62-65 of series 3. Musculoskeletal: There are no aggressive appearing lytic or blastic lesions noted in the visualized portions of the skeleton. IMPRESSION: 1. Findings are highly concerning for perforated bowel. Specifically, the stomach appears abnormal, with potential gastric ulcer in the region of the antrum of the stomach along the lesser curvature superiorly. There is a small amount of perigastric fluid and trace volume of pneumoperitoneum in the upper abdomen, as detailed above. Correlation with endoscopy is suggested. 2. Dependent areas of atelectasis and consolidation in the lower lobes of the lungs bilaterally, the appearance of which suggests sequela of mild aspiration. 3. Small volume of ascites. 4. Hepatic steatosis. 5. Mild cardiomegaly. The appearance of the lungs suggests mild interstitial pulmonary edema, which may suggest mild congestive heart failure. 6. Aortic atherosclerosis, in addition to left main and 3 vessel coronary artery disease. Assessment for potential risk factor modification, dietary therapy or pharmacologic therapy may be warranted, if clinically indicated. 7. There are calcifications of the aortic valve. Echocardiographic correlation for evaluation of potential valvular dysfunction may be warranted if clinically indicated. 8. Additional incidental findings, as above. Critical Value/emergent results were called by telephone at the time of interpretation on 04/08/2021 at 6:22 am to provider [facility], who verbally acknowledged these results. Electronically Signed: By: Vinnie Langton M.D. On: 04/08/2021 06:27   DG Chest Portable 1 View  Result Date: 04/08/2021 CLINICAL DATA:  Vomiting for several days EXAM: PORTABLE CHEST 1  VIEW COMPARISON:  03/13/2021 FINDINGS: Check shadow is enlarged. Defibrillator is again noted and stable. The lungs are well aerated bilaterally with the exception of the left base with left retrocardiac density and small left effusion. IMPRESSION: Left basilar opacity with small effusion. Electronically Signed   By: Inez Catalina M.D.   On: 04/08/2021 03:49    Anti-infectives: Anti-infectives (From admission, onward)    Start     Dose/Rate Route Frequency Ordered Stop   04/09/21 0400  vancomycin (VANCOCIN) IVPB 1000 mg/200 mL premix        1,000 mg 200 mL/hr over 60 Minutes Intravenous Every 24 hours 04/08/21 0724     04/08/21 1000  ceFEPIme (MAXIPIME) 2 g in sodium chloride 0.9 % 100 mL IVPB        2 g 200 mL/hr over 30 Minutes Intravenous Every 12 hours 04/08/21 0724     04/08/21 0800  metroNIDAZOLE (FLAGYL) IVPB 500 mg        500 mg 100 mL/hr over 60 Minutes Intravenous Every 12 hours 04/08/21 0704 08/06/23 0759   04/08/21 0345  vancomycin (VANCOREADY) IVPB 1750 mg/350 mL        1,750 mg 175 mL/hr over 120 Minutes Intravenous  Once 04/08/21 0324 04/08/21 0622   04/08/21 0330  ceFEPIme (MAXIPIME) 2 g in sodium chloride 0.9 % 100 mL IVPB        2 g 200 mL/hr over 30 Minutes Intravenous  Once 04/08/21 0324 04/08/21 0416   04/08/21 0330  metroNIDAZOLE (FLAGYL) IVPB 500 mg        500 mg 100 mL/hr over 60 Minutes Intravenous  Once 04/08/21 0324 04/08/21 0541       Assessment/Plan: s/p Procedure(s): EXPLORATORY LAPAROTOMY (  N/A) Continue bowel rest Possible small contained perforation of stomach. As long as he has no abd pain will plan for treat him with IV abx and full support. With his cardiac function he is at very high risk for complication with surgery.  He states that he absolutely refuses any surgery and is going to sign out AMA today Pneumoperitoneum, possible gastric perforation  - CT with concern for above - patient exam actually very benign with no abdominal ttp and no  distention  - pt is on levo but is alert and oriented  - will follow very closely but no indication for emergent surgical intervention this morning  - if patient looking stable later this AM will consider UGI to evaluate for possible contained perforation. If looking less stable or having more abdominal pain, may require operative exploration  - continue PPI gtt and IV abx, continue NPO Septic shock - secondary to above, on levophed, BP remains soft  Dehydration - appears hemo-concentrated, rehydration per primary/cards   FEN: NPO, PPI gtt VTE: SCDs ID: [email or web address]   CKD stage Rubio HFrEF s/p AICD - EF <20% 10/10/20 Atrial Fibrillation on Eliquis - LD yesterday around 12 PM, took a half dose T2DM Hx of HTN Tobacco abuse  LOS: 1 day    Anthony Rubio 04/09/2021

## 2021-04-09 NOTE — Progress Notes (Signed)
Co-ox low at 49%. Dr Aundra Dubin notified. Start DBA 2.5 mcg/kg/min given low BP. Try to wean NE as tolerated. CVP monitoring ordered. RN working on set up.   Lyda Jester, PA-C

## 2021-04-09 NOTE — Progress Notes (Signed)
2D echocardiogram completed. Refer to "CV Proc" under chart review to view preliminary results.  04/09/2021 1:48 PM Kelby Aline., MHA, RVT, RDCS, RDMS

## 2021-04-09 NOTE — Progress Notes (Addendum)
Advanced Heart Failure Rounding Note  PCP-Cardiologist: Dr. Aundra Dubin     Patient Profile    70 y/o male w/ CAD, mixed ischemic/nonischemic cardiomyopathy, atrial fibrillation on Eliqius, type 2 diabetes, recent worsening of HF due to increase in RV pacing, s/p recent upgrade to CRT-D 03/13/21, admitted w/ septic shock in setting of perforated bowel. AHF team consulted for preoperative evaluation/surgical clearance.    TEE 4/22: LVEF <20%, RV moderately reduced   Subjective:    Denies abdominal pain. No n/v but bump in WBC from 5.3>>12.7. AF. Abx changed to Zosyn. NPO and on Protonix gtt. He is frustrated and wants to go home.  Does not want any further interventions and considering leaving AMA. GS would like to complete UGI to evaluate for possible contained perforation if he decides to stay.   BCx NGTD   Remains on NE 3. Cuff MAPs low 70s. SBPs 90s. Distal extremities are cool. Volume status ok. No dyspnea. F/u 2D echo pending. AV-paced on tele, HR 70s.   Objective:   Weight Range: 93.3 kg Body mass index is 25.04 kg/m.   Vital Signs:   Temp:  [98.2 F (36.8 C)-100.2 F (37.9 C)] 98.7 F (37.1 C) (10/05 0736) Pulse Rate:  [64-78] 70 (10/05 0815) Resp:  [9-31] 19 (10/05 0815) BP: (69-159)/(48-141) 98/78 (10/05 0815) SpO2:  [85 %-100 %] 91 % (10/05 0815) FiO2 (%):  [100 %] 100 % (10/04 1000) Weight:  [93.3 kg-94.7 kg] 93.3 kg (10/05 0334) Last BM Date: 04/08/21  Weight change: Filed Weights   04/08/21 0331 04/08/21 1000 04/09/21 0334  Weight: 90.7 kg 94.7 kg 93.3 kg    Intake/Output:   Intake/Output Summary (Last 24 hours) at 04/09/2021 0852 Last data filed at 04/09/2021 0700 Gross per 24 hour  Intake 1583.91 ml  Output 755 ml  Net 828.91 ml      Physical Exam    General:  thin, fatigued appearing AAM No resp difficulty HEENT: Normal Neck: Supple. JVP . Carotids 2+ bilat; no bruits. No lymphadenopathy or thyromegaly appreciated. Cor: PMI nondisplaced.  Regular rate & rhythm. No rubs, gallops or murmurs. Lungs: Clear Abdomen: Soft, nontender, nondistended. No hepatosplenomegaly. No bruits or masses. Hypoactive bowel sounds. Extremities: No cyanosis, clubbing, rash, edema, bilateral LEs cool to touch  Neuro: Alert & orientedx3, cranial nerves grossly intact. moves all 4 extremities w/o difficulty. Affect pleasant   Telemetry   A-V paced, 70s   EKG    No new EKG to review   Labs    CBC Recent Labs    04/08/21 0313 04/08/21 0519 04/09/21 0300  WBC 5.3  --  12.7*  NEUTROABS 4.1  --   --   HGB 17.8* 18.4* 18.7*  HCT 57.7* 54.0* 59.6*  MCV 97.8  --  96.8  PLT 150  --  [id number]   Basic Metabolic Panel Recent Labs    04/08/21 0313 04/08/21 0511 04/08/21 0519 04/09/21 0300  NA 143  --  140 141  K 4.3  --  4.0 4.3  CL 104  --  112* 104  CO2 26  --   --  23  GLUCOSE 157*  --  128* 150*  BUN 35*  --  36* 43*  CREATININE 2.76*  --  2.50* 2.87*  CALCIUM 8.8*  --   --  8.6*  MG  --  2.5*  --  2.6*  PHOS  --   --   --  5.7*   Liver Function Tests Recent Labs  04/08/21 0313  AST 28  ALT 27  ALKPHOS 81  BILITOT 1.9*  PROT 6.1*  ALBUMIN 3.0*   Recent Labs    04/08/21 0313  LIPASE 25   Cardiac Enzymes No results for input(s): CKTOTAL, CKMB, CKMBINDEX, TROPONINI in the last 72 hours.  BNP: BNP (last 3 results) Recent Labs    09/09/20 0952 04/08/21 0325  BNP 2,121.4* 2,679.7*    ProBNP (last 3 results) No results for input(s): PROBNP in the last 8760 hours.   D-Dimer No results for input(s): DDIMER in the last 72 hours. Hemoglobin A1C No results for input(s): HGBA1C in the last 72 hours. Fasting Lipid Panel No results for input(s): CHOL, HDL, LDLCALC, TRIG, CHOLHDL, LDLDIRECT in the last 72 hours. Thyroid Function Tests No results for input(s): TSH, T4TOTAL, T3FREE, THYROIDAB in the last 72 hours.  Invalid input(s): FREET3  Other results:   Imaging    No results found.   Medications:      Scheduled Medications:  Chlorhexidine Gluconate Cloth  6 each Topical Daily   mupirocin ointment   Topical BID   [START ON 04/11/2021] pantoprazole  40 mg Intravenous Q12H    Infusions:  sodium chloride Stopped (04/08/21 0713)   ceFEPime (MAXIPIME) IV Stopped (04/08/21 2229)   metronidazole 500 mg (04/09/21 0804)   norepinephrine (LEVOPHED) Adult infusion 3 mcg/min (04/09/21 0700)   pantoprazole 8 mg/hr (04/09/21 0700)   vancomycin Stopped (04/09/21 0428)    PRN Medications: docusate sodium, polyethylene glycol   Assessment/Plan   1. Septic Shock/ Perforated Bowel  - ? If 2/2 low output from severe biventricular failure  - GS following for potential surgery, though would be high risk  - improving w/ conservative management w/ abx and bowel rest, though WBC is up slightly. No indication for emergent surgical intervention at this point  - GS consider UGI to evaluate for possible contained perforation if pt willing to proceed  - BCx NGTD  - NE for BP support  - If +BCx, will need TEE given new device lead    2. Chronic Biventricular Heart Failure  - mixed ischemic/nonischemic cardiomyopathy - recent worsening HF w/ increase in RV pacing s/p recent device upgrade to CRT-D 9/8 - TEE 4/22 EF <20%, RV moderately reduced  - Overall poor functional status, chronically NYHA Class IIIb at baseline  - He is not volume overloaded on exam. Suspect he is dry, Hold diuretics  - Distal extremities cool to touch concerning for low output. No central access. Continue on NE. Recheck Lactate  - Update echo  - Holding HF meds/GDMT w/ hypotension requiring NE    3. CAD:  - LHC in 2011 showed large, ectatic coronaries with slow flow and moderate to severe diffuse distal vessel disease (consistent with diabetes) without good interventional options. Treated medically.  - denies any recent ischemic like CP, though poor functional status at baseline  - Not on ASA w/ Eliquis use for PAF - hold ?  blocker w/ hypotension/shock  - holding statin given NPO for perforated bowel    3. PAF:  - maintaining NSR, HR controlled    4. Pre-operative Assessment  - no recent ischemic chest pain, though poor functional status at baseline. Unable to complete > 4METs of physical activity, limited by chronic HF symptoms  - Would be high risk for perioperative complications given severe biventricular HF  - with recent device upgrade, will repeat echo to see if any improvement in LVEF  - hopefully will continue to improve w/  conservative management. However if surgery cannot be avoided, consider PICC line placement to check co-ox to help assess for low-output/ need for further optimization w/ inotropic support prior to OR   5. Stage IV CKD  - SCr baseline ~2.5 - 2.87 today. Currently NPO, may be contributing. Off IVFs  - also possible cardiorenal/low output  - monitor closely   Length of Stay: 1  Brittainy Simmons, PA-C  04/09/2021, 8:52 AM  Advanced Heart Failure Team Pager [phone] (M-F; 7a - 5p)  Please contact [facility] for night-coverage after hours (5p -7a ) and weekends on [email or web address]  Patient seen with PA, agree with the above note.   No further abdominal pain.  No dyspnea.  He remains on NE 3.  CT and UGI series show large gastric ulcer with contained perforation.  Subtle residual extravasation on CT.  Also concern for gastric neoplasm.    Patient is frustrated and wants to leave AMA.   General: NAD Neck: JVP 8-9 cm, no thyromegaly or thyroid nodule.  Lungs: Clear to auscultation bilaterally with normal respiratory effort. CV: Nondisplaced PMI.  Heart regular S1/S2, no S3/S4, 2/6 SEM RUSB.  Trace ankle edema.   Abdomen: Soft, surprisingly nontender, no hepatosplenomegaly, no distention.  Skin: Intact without lesions or rashes.  Neurologic: Alert and oriented x 3.  Psych: Normal affect. Extremities: No clubbing or cyanosis.  HEENT: Normal.   1. Perforated gastric ulcer: CT  and UGI series show large gastric ulcer with contained perforation.  Subtle residual extravasation on CT.  Also concern for gastric neoplasm.  High risk for surgical complications and no plan at this time for surgery but surgeon following.  Abdominal exam is surprisingly benign.  - Continue NPO for now until cleared by surgery.  2. Shock: Suspect primarily septic shock from inflammation in setting of perforated gastric ulcer.  However, patient has baseline cardiomyopathy.  - Wean norepinephrine as able.   - Antibiotic coverage with Zosyn.  - Patient would ideally have a central line to follow CVP and co-ox, but not sure would agree to one at this time.  - Lactate still mildly elevated in setting of primarily septic shock and perforated viscus.  3. Atrial fibrillation: He is currently in NSR.  - Anticoagulation on hold with perforated gastric ulcer.  - Amiodarone on hold as not taking po, can use IV if needed.  4. CKD stage 4: Creatinine 2.87, mildly higher than baseline.  - Follow closely.   5. Acute on chronic systolic heart failure: Mixed ischemic/nonischemic cardiomyopathy. Has [employer] CRT-D device.  He has significant distal vessel CAD on prior cath as well as a history of poorly controlled diabetes. At baseline, he is not very active due to severe peripheral neuropathy.  Echo today showed EF < 20%, severe LV dilation, RV mildly decreased systolic function, mild to moderate AS, IVC dilated.  Suspect volume overload with dilated IVC but currently NPO and denies dyspnea.   - Hold off on diuresis for now.  - Ideally would have CVL to follow CVP and co-ox but not sure he will agree.   5. CAD: Diffuse moderate to severe distal vessel disease on last cath with ectatic coronaries with slow flow. No interventional target.  No ischemic chest pain.  - restart atorvastatin when he is taking po.    Patient says he is going home today.  He is no fit state for this. His wife cannot care for him and not  sure he would survive.   CRITICAL CARE Performed  by: [name]  Total critical care time: 35 minutes  Critical care time was exclusive of separately billable procedures and treating other patients.  Critical care was necessary to treat or prevent imminent or life-threatening deterioration.  Critical care was time spent personally by me on the following activities: development of treatment plan with patient and/or surrogate as well as nursing, discussions with consultants, evaluation of patient's response to treatment, examination of patient, obtaining history from patient or surrogate, ordering and performing treatments and interventions, ordering and review of laboratory studies, ordering and review of radiographic studies, pulse oximetry and re-evaluation of patient's condition.  [name] 04/09/2021 4:02 PM

## 2021-04-09 NOTE — Procedures (Signed)
Central Venous Catheter Insertion Procedure Note  Demontrez Ardis  [id number]  01-24-1951  Date:04/09/21  Time:3:57 PM   Provider Performing:Jennetta Flood   Procedure: Insertion of Non-tunneled Central Venous [phone]) with US guidance [id number])   Indication(s) Medication administration  Consent Risks of the procedure as well as the alternatives and risks of each were explained to the patient and/or caregiver.  Consent for the procedure was obtained and is signed in the bedside chart  Anesthesia Topical only with 1% lidocaine   Timeout Verified patient identification, verified procedure, site/side was marked, verified correct patient position, special equipment/implants available, medications/allergies/relevant history reviewed, required imaging and test results available.  Sterile Technique Maximal sterile technique including full sterile barrier drape, hand hygiene, sterile gown, sterile gloves, mask, hair covering, sterile ultrasound probe cover (if used).  Procedure Description Area of catheter insertion was cleaned with chlorhexidine and draped in sterile fashion.  With real-time ultrasound guidance a central venous catheter was placed into the right subclavian vein. Nonpulsatile blood flow and easy flushing noted in all ports.  The catheter was sutured in place and sterile dressing applied.  Complications/Tolerance None; patient tolerated the procedure well. Chest X-ray is ordered to verify placement for internal jugular or subclavian cannulation.   Chest x-ray is not ordered for femoral cannulation.  EBL Minimal  Specimen(s) None

## 2021-04-09 NOTE — Progress Notes (Addendum)
NAME:  Anthony Rubio., MRN:  [id number], DOB:  06/04/1951, LOS: 1 ADMISSION DATE:  04/08/2021, CONSULTATION DATE:  04/09/21 REFERRING MD:  EDP CHIEF COMPLAINT: Persistent vomiting  History of Present Illness:  Anthony Rubio is a 70 year old male with past medical history of HFrEF with EF <20%, atrial flutter, CAD, AICD, CKD stage III, type 2 diabetes who presented to the ED with approximately 5 days of persistent vomiting.  He reports no hematemesis, His stomach has felt "sore" but no severe abdominal pain, started developing diarrhea since arrival to the ED. He denies fevers, cough, or known ill contacts.  In the ED, he was initially hypotensive which improved marginally with IV fluid.  Labs are significant for lactic acid of 2.1, no leukocytosis, creatinine at baseline.  PCCM consulted for persistent hypotension after 2 L IV fluid.  CT abdomen/pelvis concerning for perforated bowel with potential gastric ulcer in the antrum of the stomach.   Pertinent  Medical History   has a past medical history of AICD (automatic cardioverter/defibrillator) present, Aortic stenosis, Arthritis, Atrial flutter ([place]), CAD (coronary artery disease), CHF (congestive heart failure) ([place] [place]), CKD (chronic kidney disease), stage III ([place]), Complication of anesthesia, Diabetic neuropathy ([place]), Dysrhythmia, Foot ulcer, left ([place]), GERD (gastroesophageal reflux disease), History of echocardiogram (07/2015), HTN (hypertension), ICD (implantable cardiac defibrillator) in place (11/11), Neuropathy associated with endocrine disorder ([place]), Tobacco abuse, and Uncontrolled type II diabetes mellitus.   Significant Hospital Events: Including procedures, antibiotic start and stop dates in addition to other pertinent events   10/24 admit to PCCM, concern for perforated bowel CT  Interim History / Subjective:  No overnight events. Patient is awake and alert, denies any abdominal pain. Breathing has improved. He states he wants to  leave. Discussed risks of leaving while he is still requiring pressor support.   Objective   Blood pressure 97/78, pulse 69, temperature 98.8 F (37.1 C), temperature source Oral, resp. rate (!) 23, height '6\' 4"'$  (1.93 m), weight 93.3 kg, SpO2 91 %.    FiO2 (%):  [100 %] 100 %   Intake/Output Summary (Last 24 hours) at 04/09/2021 0723 Last data filed at 04/09/2021 0700 Gross per 24 hour  Intake 2083.91 ml  Output 755 ml  Net 1328.91 ml   Filed Weights   04/08/21 0331 04/08/21 1000 04/09/21 0334  Weight: 90.7 kg 94.7 kg 93.3 kg   General: Thin, elderly male, resting in bed in no severe distress HEENT: MM pink/moist Neuro: Awake and alert, oriented x3, moving all extremities without any focal deficits CV: s1s2 rrr, no m/r/g PULM: on 6L [place], breathing comfortably  GI: soft, bsx4 active  Extremities: warm/dry, no edema  Skin: no rashes or lesions   Resolved Hospital Problem list     Assessment & Plan:  Perforated bowel with likely septic shock CT abdomen showed potential gastric ulcer in the region of the antrum with small amount of perigastric fluid and pneumoperitoneum concerning for perforated bowel.  Current plan is conservative management with bowel rest and antibiotics.  Currently still on Levophed, pressures remain soft however map is well above goal of 65. -Surgery on board, appreciate recommendations -Continue weaning Levophed as tolerated -N.p.o. -Switch antibiotics to Zoysn  Chronic biventricular heart failure  CAD  Atrial flutter on Eliquis Most recent EF less than 20%.  Heart rate remains controlled. -Cardiology and heart failure on board. -Echocardiogram pending -Hold Coreg, amiodarone, Farxiga and torsemide in the setting of hypotension -Hold anticoagulation for possible surgical intervention  CKD stage III  Stable, creatinine 2.87 near baseline of 2.7. Continue to monitor renal indices, urine output, electrolytes and avoid nephrotoxins  Type 2  diabetes -Continue SSI  Hyperlipidemia -Hold statin given n.p.o. for perforated bowel  Best Practice (right click and "Reselect all SmartList Selections" daily)   Diet/type: NPO DVT prophylaxis: SCD GI prophylaxis: PPI Lines: N/A Foley:  N/A Code Status:  full code Last date of multidisciplinary goals of care discussion [10/5]  Labs   CBC: Recent Labs  Lab 04/08/21 0313 04/08/21 0519 04/09/21 0300  WBC 5.3  --  12.7*  NEUTROABS 4.1  --   --   HGB 17.8* 18.4* 18.7*  HCT 57.7* 54.0* 59.6*  MCV 97.8  --  96.8  PLT 150  --  [id number]    Basic Metabolic Panel: Recent Labs  Lab 04/08/21 0313 04/08/21 0511 04/08/21 0519 04/09/21 0300  NA 143  --  140 141  K 4.3  --  4.0 4.3  CL 104  --  112* 104  CO2 26  --   --  23  GLUCOSE 157*  --  128* 150*  BUN 35*  --  36* 43*  CREATININE 2.76*  --  2.50* 2.87*  CALCIUM 8.8*  --   --  8.6*  MG  --  2.5*  --  2.6*  PHOS  --   --   --  5.7*   GFR: Estimated Creatinine Clearance: 29 mL/min (A) (by C-G formula based on SCr of 2.87 mg/dL (H)). Recent Labs  Lab 04/08/21 0313 04/08/21 0340 04/08/21 0511 04/09/21 0300  WBC 5.3  --   --  12.7*  LATICACIDVEN  --  2.1* 2.1*  --     Liver Function Tests: Recent Labs  Lab 04/08/21 0313  AST 28  ALT 27  ALKPHOS 81  BILITOT 1.9*  PROT 6.1*  ALBUMIN 3.0*   Recent Labs  Lab 04/08/21 0313  LIPASE 25   No results for input(s): AMMONIA in the last 168 hours.  ABG    Component Value Date/Time   PHART 7.432 11/06/2009 1343   PCO2ART 35.3 11/06/2009 1343   PO2ART 75.0 (L) 11/06/2009 1343   HCO3 23.6 11/06/2009 1343   TCO2 21 (L) 04/08/2021 0519   O2SAT 95.0 11/06/2009 1343     Coagulation Profile: Recent Labs  Lab 04/08/21 0324  INR 1.3*    Cardiac Enzymes: No results for input(s): CKTOTAL, CKMB, CKMBINDEX, TROPONINI in the last 168 hours.  HbA1C: Hemoglobin A1C  Date/Time Value Ref Range Status  11/26/2020 09:40 AM 8.1 (A) 4.0 - 5.6 % Final  05/28/2020 02:58  PM 8.1 (A) 4.0 - 5.6 % Final   Hgb A1c MFr Bld  Date/Time Value Ref Range Status  02/13/2016 09:49 AM 7.7 (H) 4.6 - 6.5 % Final    Comment:    Glycemic Control Guidelines for People with Diabetes:Non Diabetic:  <6%Goal of Therapy: <7%Additional Action Suggested:  >8%   05/09/2014 10:44 AM 7.4 (H) 4.6 - 6.5 % Final    Comment:    Glycemic Control Guidelines for People with Diabetes:Non Diabetic:  <6%Goal of Therapy: <7%Additional Action Suggested:  >8%     CBG: Recent Labs  Lab 04/08/21 0843 04/08/21 1205 04/08/21 1529  GLUCAP 110* 156* 194*    Review of Systems:   Negative except as noted in HPI  Past Medical History:  He,  has a past medical history of AICD (automatic cardioverter/defibrillator) present, Aortic stenosis, Arthritis, Atrial flutter ([place]), CAD (coronary artery disease), CHF (congestive heart  failure) ([place]), CKD (chronic kidney disease), stage III ([place]), Complication of anesthesia, Diabetic neuropathy ([place]), Dysrhythmia, Foot ulcer, left ([place]), GERD (gastroesophageal reflux disease), History of echocardiogram (07/2015), HTN (hypertension), ICD (implantable cardiac defibrillator) in place (11/11), Neuropathy associated with endocrine disorder ([place]), Tobacco abuse, and Uncontrolled type II diabetes mellitus.   Surgical History:   Past Surgical History:  Procedure Laterality Date   BIV UPGRADE N/A 03/13/2021   Procedure: UPGRADE TO BIVI ICD;  Surgeon: Evans Lance, MD;  Location: [facility];  Service: Cardiovascular;  Laterality: N/A;   CARDIOVERSION N/A 08/23/2017   Procedure: CARDIOVERSION;  Surgeon: Larey Dresser, MD;  Location: [facility] ENDOSCOPY;  Service: Cardiovascular;  Laterality: N/A;   CARDIOVERSION N/A 09/12/2020   Procedure: CARDIOVERSION;  Surgeon: Larey Dresser, MD;  Location: [facility] ENDOSCOPY;  Service: Cardiovascular;  Laterality: N/A;  STAT INR!!!   CARDIOVERSION N/A 10/10/2020   Procedure: CARDIOVERSION;  Surgeon: Larey Dresser, MD;  Location: [facility]  ENDOSCOPY;  Service: Cardiovascular;  Laterality: N/A;   COLONOSCOPY     COLONOSCOPY WITH PROPOFOL N/A 12/01/2017   Procedure: COLONOSCOPY WITH PROPOFOL;  Surgeon: Arta Silence, MD;  Location: [facility];  Service: Endoscopy;  Laterality: N/A;   ESOPHAGOGASTRODUODENOSCOPY     ICD GENERATOR CHANGEOUT N/A 02/01/2018   Procedure: ICD GENERATOR CHANGEOUT;  Surgeon: Thompson Grayer, MD;  Location: [facility];  Service: Cardiovascular;  Laterality: N/A;   ICD implantation  2011   by JA   TEE WITHOUT CARDIOVERSION N/A 08/23/2017   Procedure: TRANSESOPHAGEAL ECHOCARDIOGRAM (TEE);  Surgeon: Larey Dresser, MD;  Location: [facility] ENDOSCOPY;  Service: Cardiovascular;  Laterality: N/A;   TEE WITHOUT CARDIOVERSION N/A 10/10/2020   Procedure: TRANSESOPHAGEAL ECHOCARDIOGRAM (TEE);  Surgeon: Larey Dresser, MD;  Location: [facility] ENDOSCOPY;  Service: Cardiovascular;  Laterality: N/A;     Social History:   reports that he has been smoking cigarettes. He has a 8.25 pack-year smoking history. He has never used smokeless tobacco. He reports that he does not drink alcohol and does not use drugs.   Family History:  His family history includes Cancer in his paternal grandfather; Congestive Heart Failure in his maternal grandmother; Diabetes in his paternal grandfather; Early death in his father and son; Gout in his maternal grandfather; Heart attack in his son; Stroke in his mother.   Allergies Allergies  Allergen Reactions   Erythromycin Other (See Comments)    Toes/finger tingling   Hydralazine Other (See Comments)    Fatigue--felt terrible   Lisinopril Other (See Comments)    Higher doses=lightheadness/dizziness/feeling faint   Losartan Potassium Other (See Comments)    Extreme fatigue     Home Medications  Prior to Admission medications   Medication Sig Start Date End Date Taking? Authorizing Provider  amiodarone (PACERONE) 200 MG tablet Take 1 tablet (200 mg total) by mouth daily. 01/03/21  Yes  Allred, Jeneen Rinks, MD  apixaban (ELIQUIS) 5 MG TABS tablet Take 1 tablet (5 mg total) by mouth 2 (two) times daily. 09/23/20  Yes Larey Dresser, MD  atorvastatin (LIPITOR) 80 MG tablet Take 80 mg by mouth daily.   Yes [provider]  carvedilol (COREG) 6.25 MG tablet Take 2 tablets (12.5 mg total) by mouth 2 (two) times daily with a meal. Patient taking differently: Take 18.75 mg by mouth 2 (two) times daily with a meal. 09/30/20  Yes [place], Satira Mccallum, PA-C  dapagliflozin propanediol (FARXIGA) 10 MG TABS tablet Take 10 mg by mouth daily.   Yes [provider]  ezetimibe (ZETIA) 10 MG tablet Take 1 tablet (10 mg total) by mouth daily. 09/09/20  Yes [place], Maricela Bo, FNP  isosorbide mononitrate (IMDUR) 30 MG 24 hr tablet Take 1/2 (one-half) tablet by mouth once daily Patient taking differently: Take 15 mg by mouth daily. 10/18/20  Yes Larey Dresser, MD  Lancets (ACCU-CHEK SOFT [facility]) lancets Use to test blood sugar once a day E11.9 Patient taking differently: Check blood sugar as needed 08/04/18  Yes Vivi Barrack, MD  torsemide (DEMADEX) 20 MG tablet Take 1 tablet (20 mg total) by mouth every other day. Patient taking differently: Take 40 mg by mouth every other day. 12/12/20  Yes Larey Dresser, MD  empagliflozin (JARDIANCE) 10 MG TABS tablet Take 1 tablet (10 mg total) by mouth daily before breakfast. Patient not taking: No sig reported 02/14/21   Larey Dresser, MD  glipiZIDE (GLUCOTROL XL) 10 MG 24 hr tablet Take 1-2 tablets (10-20 mg total) by mouth daily with breakfast. Take 2 tablets if blood sugar > 200. Patient not taking: No sig reported 02/20/19   Vivi Barrack, MD     [place], DO PGY-3

## 2021-04-10 DIAGNOSIS — J810 Acute pulmonary edema: Secondary | ICD-10-CM | POA: Diagnosis not present

## 2021-04-10 DIAGNOSIS — I5022 Chronic systolic (congestive) heart failure: Secondary | ICD-10-CM | POA: Diagnosis not present

## 2021-04-10 LAB — BASIC METABOLIC PANEL
Anion gap: 10 (ref 5–15)
BUN: 39 mg/dL — ABNORMAL HIGH (ref 8–23)
CO2: 27 mmol/L (ref 22–32)
Calcium: 8.6 mg/dL — ABNORMAL LOW (ref 8.9–10.3)
Chloride: 112 mmol/L — ABNORMAL HIGH (ref 98–111)
Creatinine, Ser: 2.49 mg/dL — ABNORMAL HIGH (ref 0.61–1.24)
GFR, Estimated: 27 mL/min — ABNORMAL LOW (ref >60)
Glucose, Bld: 99 mg/dL (ref 70–99)
Potassium: 3.7 mmol/L (ref 3.5–5.1)
Sodium: 149 mmol/L — ABNORMAL HIGH (ref 135–145)

## 2021-04-10 LAB — CBC WITH DIFFERENTIAL/PLATELET
Abs Immature Granulocytes: 0.02 10*3/uL (ref 0.00–0.07)
Basophils Absolute: 0 10*3/uL (ref 0.0–0.1)
Basophils Relative: 0 %
Eosinophils Absolute: 0 10*3/uL (ref 0.0–0.5)
Eosinophils Relative: 0 %
HCT: 51.3 % (ref 39.0–52.0)
Hemoglobin: 16.2 g/dL (ref 13.0–17.0)
Immature Granulocytes: 0 %
Lymphocytes Relative: 11 %
Lymphs Abs: 0.8 10*3/uL (ref 0.7–4.0)
MCH: 30.2 pg (ref 26.0–34.0)
MCHC: 31.6 g/dL (ref 30.0–36.0)
MCV: 95.5 fL (ref 80.0–100.0)
Monocytes Absolute: 0.7 10*3/uL (ref 0.1–1.0)
Monocytes Relative: 10 %
Neutro Abs: 5.6 10*3/uL (ref 1.7–7.7)
Neutrophils Relative %: 79 %
Platelets: 88 10*3/uL — ABNORMAL LOW (ref 150–400)
RBC: 5.37 MIL/uL (ref 4.22–5.81)
RDW: 16.4 % — ABNORMAL HIGH (ref 11.5–15.5)
WBC: 7.2 10*3/uL (ref 4.0–10.5)
nRBC: 0 % (ref 0.0–0.2)

## 2021-04-10 LAB — COOXEMETRY PANEL
Carboxyhemoglobin: 1.4 % (ref 0.5–1.5)
Methemoglobin: 0.7 % (ref 0.0–1.5)
O2 Saturation: 64 %
Total hemoglobin: 16.4 g/dL — ABNORMAL HIGH (ref 12.0–16.0)

## 2021-04-10 MED ORDER — ORAL CARE MOUTH RINSE
15.0000 mL | Freq: Two times a day (BID) | OROMUCOSAL | Status: DC
  Administered 2021-04-11 – 2021-04-16 (×12): 15 mL via OROMUCOSAL

## 2021-04-10 MED ORDER — SODIUM CHLORIDE 0.9% FLUSH: 10.0000 mL | INTRAVENOUS | Status: DC | PRN

## 2021-04-10 MED ORDER — CHLORHEXIDINE GLUCONATE 0.12 % MT SOLN
15.0000 mL | Freq: Two times a day (BID) | OROMUCOSAL | Status: DC
  Administered 2021-04-11 – 2021-04-17 (×14): 15 mL via OROMUCOSAL
  Filled 2021-04-10 (×13): qty 15

## 2021-04-10 MED ORDER — SODIUM CHLORIDE 0.9% FLUSH
10.0000 mL | Freq: Two times a day (BID) | INTRAVENOUS | Status: DC
  Administered 2021-04-10 – 2021-04-17 (×15): 10 mL

## 2021-04-10 MED ORDER — POTASSIUM CHLORIDE 10 MEQ/50ML IV SOLN
10.0000 meq | INTRAVENOUS | Status: AC
  Administered 2021-04-10 (×3): 10 meq via INTRAVENOUS
  Filled 2021-04-10 (×3): qty 50

## 2021-04-10 NOTE — TOC Initial Note (Signed)
Transition of Care (TOC) - Initial/Assessment Note    Patient Details  Name: Anthony Rubio. MRN: [id number] Date of Birth: 05-08-1951  Transition of Care [facility] CM/SW Contact:    [place], [place] Phone Number: 04/10/2021, 4:02 PM  Clinical Narrative:                 HF CSW spoke with Anthony Rubio at bedside and attempted to complete a very brief SDOH with the patient however Anthony Rubio reported his concerns regarding the cost of the hospitalization and not wanting to be at the hospital as he feels fine. Anthony Rubio wife, [name] entered the room and attempted to provide clarification to Anthony Rubio about medical necessity and clarity about what the doctors have shared with him. CSW provided active listening and support and informed Anthony Rubio if he needs to vent more to reach out to the CSW for support to provide a listening ear if needed. CSW provided the patient with the social workers name, number and position and if anything changes to please reach out so that the CSW can provide support.  CSW will continue to follow throughout discharge.    Barriers to Discharge: Continued Medical Work up   Patient Goals and CMS Choice        Expected Discharge Plan and Services   In-house Referral: Clinical Social Work     Living arrangements for the past 2 months: Single Family Home                                      Prior Living Arrangements/Services Living arrangements for the past 2 months: Single Family Home Lives with:: Self, Spouse Patient language and need for interpreter reviewed:: Yes        Need for Family Participation in Patient Care: No (Comment) Care giver support system in place?: No (comment)   Criminal Activity/Legal Involvement Pertinent to Current Situation/Hospitalization: No - Comment as needed  Activities of Daily Living      Permission Sought/Granted                  Emotional Assessment Appearance:: Appears stated age Attitude/Demeanor/Rapport:  Engaged, Suspicious Affect (typically observed): Pleasant Orientation: : Oriented to Self, Oriented to Place, Oriented to  Time, Oriented to Situation   Psych Involvement: No (comment)  Admission diagnosis:  Dehydration [E86.0] Pneumoperitoneum [K66.8] Bowel perforation (HCC) [K63.1] Acute pulmonary edema (HCC) [J81.0] Elevated troponin I level [R77.8] Nausea vomiting and diarrhea [R11.2, R19.7] Aspiration pneumonia due to gastric secretions, unspecified laterality, unspecified part of lung ([place]) [J69.0] Sepsis with acute hypoxic respiratory failure, due to unspecified organism, unspecified whether septic shock present ([place]) [A41.9, R65.20, J96.01] Patient Active Problem List   Diagnosis Date Noted   Acute pulmonary edema ([place])    Sepsis with acute hypoxic respiratory failure ([place])    Bowel perforation ([place]) 04/08/2021   Dehydration    Nausea vomiting and diarrhea    Pneumoperitoneum    Shock ([place])    Perforated bowel ([place])    Cigarette smoker 08/10/2018   GERD (gastroesophageal reflux disease) 08/10/2018   Dyslipidemia associated with type 2 diabetes mellitus ([place]) 06/01/2017   Cardiomyopathy- Mixed Ischemic and Non-Ischemic 03/07/2015   ICD (implantable cardioverter-defibrillator), dual, in situ 03/07/2015   Aortic stenosis 05/03/2014   Atrial flutter ([place]) [id number]   Chronic systolic heart failure ([place]) 12/19/2009   CERVICAL SPONDYLOSIS WITH MYELOPATHY 11/22/2009   GAIT  DISTURBANCE 11/22/2009   Type 2 diabetes, uncontrolled, with neuropathy ([place]) 11/21/2009   SMOKER 11/21/2009   CAD (coronary artery disease) 11/21/2009   Chronic kidney disease, stage III (moderate) ([place]) 11/21/2009   Hypertension associated with diabetes ([place]) 11/14/2009   PCP:  Vivi Barrack, MD Pharmacy:   [facility], [place] [address] Phone: [phone] Fax: [place], [place] - [place] [address] [place] [address] Phone: [phone] Fax: [phone]     Social Determinants of Health (SDOH) Interventions Food Insecurity Interventions: Intervention Not Indicated Financial Strain Interventions: Intervention Not Indicated Housing Interventions: Intervention Not Indicated Transportation Interventions: Intervention Not Indicated  Readmission Risk Interventions No flowsheet data found.  Lynn Recendiz, MSW, [place] Heart Failure Social Worker

## 2021-04-10 NOTE — Progress Notes (Signed)
Patient ID: Anthony Rubio., male   DOB: [birth year], 70 y.o.   MRN: [id number]  I had another long conversation with patient and family about his situation. His options are #1 he could have surgery for the stomach problem which he may not survive because of his cardiac condition. If he has ulcer disease then it may be an unnecessary surgery. #2 he can be treated with abx, ppi and bowel rest. If this is ulcer disease then he may not require surgery. If this is a cancer and it doesn't heal well then if he remains stable then he could be scoped in a few weeks to make a diagnosis. If it is a gastric cancer then our surgeons would prefer that he get neoadjuvant therapy prior to resection since a subtotal/total gastrectomy would be difficult for him to tolerate with his underlying conditions. He understands all this and prefers not to have surgery at this time. We will follow

## 2021-04-10 NOTE — Progress Notes (Signed)
NAME:  Dashiell Chevere., MRN:  [id number], DOB:  06/24/1951, LOS: 2 ADMISSION DATE:  04/08/2021, CONSULTATION DATE:  04/10/21 REFERRING MD:  EDP CHIEF COMPLAINT: Persistent vomiting  History of Present Illness:  Iremide Meseke is a 70 year old male with past medical history of HFrEF with EF <20%, atrial flutter, CAD, AICD, CKD stage III, type 2 diabetes who presented to the ED with approximately 5 days of persistent vomiting.  He reports no hematemesis, His stomach has felt "sore" but no severe abdominal pain, started developing diarrhea since arrival to the ED. He denies fevers, cough, or known ill contacts.  In the ED, he was initially hypotensive which improved marginally with IV fluid.  Labs are significant for lactic acid of 2.1, no leukocytosis, creatinine at baseline.  PCCM consulted for persistent hypotension after 2 L IV fluid.  CT abdomen/pelvis concerning for perforated bowel with potential gastric ulcer in the antrum of the stomach.   Pertinent  Medical History   has a past medical history of AICD (automatic cardioverter/defibrillator) present, Aortic stenosis, Arthritis, Atrial flutter ([place]), CAD (coronary artery disease), CHF (congestive heart failure) ([place]), CKD (chronic kidney disease), stage III ([place]), Complication of anesthesia, Diabetic neuropathy ([place]), Dysrhythmia, Foot ulcer, left ([place]), GERD (gastroesophageal reflux disease), History of echocardiogram (07/2015), HTN (hypertension), ICD (implantable cardiac defibrillator) in place (11/11), Neuropathy associated with endocrine disorder ([place]), Tobacco abuse, and Uncontrolled type II diabetes mellitus.   Significant Hospital Events: Including procedures, antibiotic start and stop dates in addition to other pertinent events   10/4 admit to PCCM, concern for perforated bowel CT 10/6 right subclavian central venous catheter placed   Interim History / Subjective:  Weaned off levophed, now on dobutamine. Patient is awake and alert,  denies any abdominal pain. Breathing continues to improve.   Objective   Blood pressure 99/74, pulse 70, temperature 97.8 F (36.6 C), temperature source Oral, resp. rate 17, height '6\' 4"'$  (1.93 m), weight 93.3 kg, SpO2 95 %. CVP:  [4 mmHg-5 mmHg] 5 mmHg      Intake/Output Summary (Last 24 hours) at 04/10/2021 0703 Last data filed at 04/10/2021 0600 Gross per 24 hour  Intake 371.13 ml  Output 675 ml  Net -303.87 ml   Filed Weights   04/08/21 0331 04/08/21 1000 04/09/21 0334  Weight: 90.7 kg 94.7 kg 93.3 kg   General: Thin, elderly male, resting in bed in no severe distress HEENT: MM pink/moist Neuro: Awake and alert, oriented x3, moving all extremities without any focal deficits CV: s1s2 rrr, no m/r/g PULM: on 6L , breathing comfortably  GI: soft, bsx4 active  Extremities: warm/dry, no edema  Skin: no rashes or lesions   Resolved Hospital Problem list     Assessment & Plan:  Perforated bowel with likely septic shock Upper GI series showed a large gastric ulcer with a largely contained area of perforation, also seen on repeat CT abdomen.  There is also concern of a possible mass at the antrum/pylorus region.  General surgery is considering conservative management versus explorative laparotomy given concern of mass.  Plan to optimize cardiac function prior to possible surgery.  Patient previously stating he wanted to leave AMA however now agreeable to interventions.  Patient weaned off Levophed overnight and started on dobutamine.  MAP maintaining above goal of 65. -Surgery on board, appreciate recommendations -Continue dobutamine -N.p.o. -Switch antibiotics to Z[place]n  Chronic biventricular heart failure  CAD  Atrial flutter on Eliquis Repeat echocardiogram yesterday showed an EF less than 20% with severely  decreased LV function, global hypokinesis and grade 3 diastolic dysfunction.  Right ventricular systolic function moderate reduced with mildly enlarged RV and moderately  elevated pulmonary artery systolic pressure.  Central line placed to better monitor cardiac function and he was started on dobutamine. Co oximetry panel improved this morning with an O2 saturation of 64.  Heart rate remains controlled. -Cardiology and heart failure on board, appreciate recommendations -Started on dobutamine -Hold Coreg, amiodarone, Farxiga and torsemide in the setting of hypotension -Hold anticoagulation for possible surgical intervention  CKD stage III Stable, Cr 2.49, baseline near 2.5. Continue to monitor renal indices, urine output, electrolytes and avoid nephrotoxins  Hypernatremia Sodium 149, likely concentrated in the setting of n.p.o. Will continue to monitor and may need to start gentle hydration versus TPN.  Type 2 diabetes -Continue SSI  Hyperlipidemia -Hold statin given n.p.o. for perforated bowel  Best Practice (right click and "Reselect all SmartList Selections" daily)   Diet/type: NPO DVT prophylaxis: SCD GI prophylaxis: PPI Lines: Central line Foley:  N/A Code Status:  full code Last date of multidisciplinary goals of care discussion [10/6]  Labs   CBC: Recent Labs  Lab 04/08/21 0313 04/08/21 0519 04/09/21 0300  WBC 5.3  --  12.7*  NEUTROABS 4.1  --   --   HGB 17.8* 18.4* 18.7*  HCT 57.7* 54.0* 59.6*  MCV 97.8  --  96.8  PLT 150  --  [id number]    Basic Metabolic Panel: Recent Labs  Lab 04/08/21 0313 04/08/21 0511 04/08/21 0519 04/09/21 0300 04/10/21 0557  NA 143  --  140 141 149*  K 4.3  --  4.0 4.3 3.7  CL 104  --  112* 104 112*  CO2 26  --   --  23 27  GLUCOSE 157*  --  128* 150* 99  BUN 35*  --  36* 43* 39*  CREATININE 2.76*  --  2.50* 2.87* 2.49*  CALCIUM 8.8*  --   --  8.6* 8.6*  MG  --  2.5*  --  2.6*  --   PHOS  --   --   --  5.7*  --    GFR: Estimated Creatinine Clearance: 33.4 mL/min (A) (by C-G formula based on SCr of 2.49 mg/dL (H)). Recent Labs  Lab 04/08/21 0313 04/08/21 0340 04/08/21 0511 04/09/21 0300  04/09/21 1110  WBC 5.3  --   --  12.7*  --   LATICACIDVEN  --  2.1* 2.1*  --  2.3*    Liver Function Tests: Recent Labs  Lab 04/08/21 0313  AST 28  ALT 27  ALKPHOS 81  BILITOT 1.9*  PROT 6.1*  ALBUMIN 3.0*   Recent Labs  Lab 04/08/21 0313  LIPASE 25   No results for input(s): AMMONIA in the last 168 hours.  ABG    Component Value Date/Time   PHART 7.432 11/06/2009 1343   PCO2ART 35.3 11/06/2009 1343   PO2ART 75.0 (L) 11/06/2009 1343   HCO3 23.6 11/06/2009 1343   TCO2 21 (L) 04/08/2021 0519   O2SAT 64.0 04/10/2021 0556     Coagulation Profile: Recent Labs  Lab 04/08/21 0324  INR 1.3*    Cardiac Enzymes: No results for input(s): CKTOTAL, CKMB, CKMBINDEX, TROPONINI in the last 168 hours.  HbA1C: Hemoglobin A1C  Date/Time Value Ref Range Status  11/26/2020 09:40 AM 8.1 (A) 4.0 - 5.6 % Final  05/28/2020 02:58 PM 8.1 (A) 4.0 - 5.6 % Final   Hgb A1c MFr Bld  Date/Time Value  Ref Range Status  02/13/2016 09:49 AM 7.7 (H) 4.6 - 6.5 % Final    Comment:    Glycemic Control Guidelines for People with Diabetes:Non Diabetic:  <6%Goal of Therapy: <7%Additional Action Suggested:  >8%   05/09/2014 10:44 AM 7.4 (H) 4.6 - 6.5 % Final    Comment:    Glycemic Control Guidelines for People with Diabetes:Non Diabetic:  <6%Goal of Therapy: <7%Additional Action Suggested:  >8%     CBG: Recent Labs  Lab 04/08/21 0843 04/08/21 1205 04/08/21 1529 04/09/21 1602  GLUCAP 110* 156* 194* 117*    Review of Systems:   Negative except as noted in HPI  Past Medical History:  He,  has a past medical history of AICD (automatic cardioverter/defibrillator) present, Aortic stenosis, Arthritis, Atrial flutter (HCC), CAD (coronary artery disease), CHF (congestive heart failure) ([place]), CKD (chronic kidney disease), stage III ([place]), Complication of anesthesia, Diabetic neuropathy ([place]), Dysrhythmia, Foot ulcer, left ([place]), GERD (gastroesophageal reflux disease), History of echocardiogram  (07/2015), HTN (hypertension), ICD (implantable cardiac defibrillator) in place (11/11), Neuropathy associated with endocrine disorder ([place]), Tobacco abuse, and Uncontrolled type II diabetes mellitus.   Surgical History:   Past Surgical History:  Procedure Laterality Date   BIV UPGRADE N/A 03/13/2021   Procedure: UPGRADE TO BIVI ICD;  Surgeon: Evans Lance, MD;  Location: [place] CV LAB;  Service: Cardiovascular;  Laterality: N/A;   CARDIOVERSION N/A 08/23/2017   Procedure: CARDIOVERSION;  Surgeon: Larey Dresser, MD;  Location[facility] ENDOSCOPY;  Service: Cardiovascular;  Laterality: N/A;   CARDIOVERSION N/A 09/12/2020   Procedure: CARDIOVERSION;  Surgeon: Larey Dresser, MD;  Location: [facility] ENDOSCOPY;  Service: Cardiovascular;  Laterality: N/A;  STAT INR!!!   CARDIOVERSION N/A 10/10/2020   Procedure: CARDIOVERSION;  Surgeon: Larey Dresser, MD;  Location[facility] END[facility]COPY;  Service: Cardiovascular;  Laterality: N/A;   COLONOSCOPY     COLONOSCOPY WITH PROPOFOL N/A 12/01/2017   Procedure: COLONOSCOPY WITH PROPOFOL;  Surgeon: Arta Silence, MD;  Location: [place];  Service: Endoscopy;  Laterality: N/A;   ESOPHAGOGASTRODUODENOSCOPY     ICD GENERATOR CHANGEOUT N/A 02/01/2018   Procedure: ICD GENERATOR CHANGEOUT;  Surgeon: Thompson Grayer, MD;  Location: [place] CV LAB;  Service: Cardiovascular;  Laterality: N/A;   ICD implantation  2011   by JA   TEE WITHOUT CARDIOVERSION N/A 08/23/2017   Procedure: TRANSESOPHAGEAL ECHOCARDIOGRAM (TEE);  Surgeon: Larey Dresser, MD;  Location[facility] ENDOSCOPY;  Service: Cardiovascular;  Laterality: N/A;   TEE WITHOUT CARDIOVERSION N/A 10/10/2020   Procedure: TRANSESOPHAGEAL ECHOCARDIOGRAM (TEE);  Surgeon: Larey Dresser, MD;  Location: [facility] ENDOSCOPY;  Service: Cardiovascular;  Laterality: N/A;     Social History:   reports that he has been smoking cigarettes. He has a 8.25 pack-year smoking history. He has never used smokeless tobacco. He reports that he  does not drink alcohol and does not use drugs.   Family History:  His family history includes Cancer in his paternal grandfather; Congestive Heart Failure in his maternal grandmother; Diabetes in his paternal grandfather; Early death in his father and son; Gout in his maternal grandfather; Heart attack in his son; Stroke in his mother.   Allergies Allergies  Allergen Reactions   Erythromycin Other (See Comments)    Toes/finger tingling   Hydralazine Other (See Comments)    Fatigue--felt terrible   Lisinopril Other (See Comments)    Higher doses=lightheadness/dizziness/feeling faint   Losartan Potassium Other (See Comments)    Extreme fatigue     Home Medications  Prior to Admission medications   Medication Sig Start Date End Date Taking? Authorizing Provider  amiodarone (PACERONE) 200 MG tablet Take 1 tablet (200 mg total) by mouth daily. 01/03/21  Yes Allred, Jeneen Rinks, MD  apixaban (ELIQUIS) 5 MG TABS tablet Take 1 tablet (5 mg total) by mouth 2 (two) times daily. 09/23/20  Yes Larey Dresser, MD  atorvastatin (LIPITOR) 80 MG tablet Take 80 mg by mouth daily.   Yes [provider]  carvedilol (COREG) 6.25 MG tablet Take 2 tablets (12.5 mg total) by mouth 2 (two) times daily with a meal. Patient taking differently: Take 18.75 mg by mouth 2 (two) times daily with a meal. 09/30/20  Yes Tillery, Satira Mccallum, PA-C  dapagliflozin propanediol (FARXIGA) 10 MG TABS tablet Take 10 mg by mouth daily.   Yes [provider]  ezetimibe (ZETIA) 10 MG tablet Take 1 tablet (10 mg total) by mouth daily. 09/09/20  Yes [place], Maricela Bo, FNP  isosorbide mononitrate (IMDUR) 30 MG 24 hr tablet Take 1/2 (one-half) tablet by mouth once daily Patient taking differently: Take 15 mg by mouth daily. 10/18/20  Yes Larey Dresser, MD  Lancets (ACCU-CHEK SOFT [facility]) lancets Use to test blood sugar once a day E11.9 Patient taking differently: Check blood sugar as needed 08/04/18  Yes Vivi Barrack,  MD  torsemide (DEMADEX) 20 MG tablet Take 1 tablet (20 mg total) by mouth every other day. Patient taking differently: Take 40 mg by mouth every other day. 12/12/20  Yes Larey Dresser, MD  empagliflozin (JARDIANCE) 10 MG TABS tablet Take 1 tablet (10 mg total) by mouth daily before breakfast. Patient not taking: No sig reported 02/14/21   Larey Dresser, MD  glipiZIDE (GLUCOTROL XL) 10 MG 24 hr tablet Take 1-2 tablets (10-20 mg total) by mouth daily with breakfast. Take 2 tablets if blood sugar > 200. Patient not taking: No sig reported 02/20/19   Vivi Barrack, MD     [place], DO PGY-3

## 2021-04-10 NOTE — Progress Notes (Addendum)
Advanced Heart Failure Rounding Note  PCP-Cardiologist: Dr. Aundra Dubin     Patient Profile    70 y/o male w/ CAD, mixed ischemic/nonischemic cardiomyopathy, atrial fibrillation on Eliqius, type 2 diabetes, recent worsening of HF due to increase in RV pacing, s/p recent upgrade to CRT-D 03/13/21, admitted w/ septic shock in setting of perforated bowel. AHF team consulted for preoperative evaluation/surgical clearance.    TEE 4/22: LVEF <20%, RV moderately reduced   Subjective:    10/5 Central line placed. CO-OX 49% Started on dobutamine. Norepi stopped.   Echo EF < 20%.   Remains on dobutamine 2.5 mcg. CO-OX 49>64%  Bld CX 10/4 -->Gram + rods. Abx changed to Zosyn. WBC down 7.2.   Creatinine trending down 2.9>2.5  Frustrated. Having multiple bowel movements.   Objective:   Weight Range: 93.3 kg Body mass index is 25.04 kg/m.   Vital Signs:   Temp:  [97.6 F (36.4 C)-98.8 F (37.1 C)] 97.6 F (36.4 C) (10/06 0748) Pulse Rate:  [65-73] 70 (10/06 0400) Resp:  [14-29] 17 (10/06 0400) BP: (75-151)/(33-95) 99/74 (10/06 0400) SpO2:  [84 %-98 %] 95 % (10/06 0400) Last BM Date: 04/10/21  Weight change: Filed Weights   04/08/21 0331 04/08/21 1000 04/09/21 0334  Weight: 90.7 kg 94.7 kg 93.3 kg    Intake/Output:   Intake/Output Summary (Last 24 hours) at 04/10/2021 0748 Last data filed at 04/10/2021 0700 Gross per 24 hour  Intake 374.63 ml  Output 875 ml  Net -500.37 ml      Physical Exam   CVP 3 personally checked.  General:  No resp difficulty. Subclavian central line. In bed.  HEENT: normal Neck: supple. no JVD. Carotids 2+ bilat; no bruits. No lymphadenopathy or thryomegaly appreciated. Cor: PMI nondisplaced. Regular rate & rhythm. No rubs, gallops or murmurs. Lungs: clear Abdomen: soft, nontender, nondistended. No hepatosplenomegaly. No bruits or masses. Good bowel sounds. Extremities: no cyanosis, clubbing, rash, edema Neuro: alert & orientedx3, cranial  nerves grossly intact. moves all 4 extremities w/o difficulty. Affect pleasant   Telemetry    A sensed V paced 70 personally reviewed.   EKG    No new EKG to review   Labs    CBC Recent Labs    04/08/21 0313 04/08/21 0519 04/09/21 0300 04/10/21 0557  WBC 5.3  --  12.7* 7.2  NEUTROABS 4.1  --   --  5.6  HGB 17.8*   < > 18.7* 16.2  HCT 57.7*   < > 59.6* 51.3  MCV 97.8  --  96.8 95.5  PLT 150  --  150 88*   < > = values in this interval not displayed.   Basic Metabolic Panel Recent Labs    04/08/21 0511 04/08/21 0519 04/09/21 0300 04/10/21 0557  NA  --    < > 141 149*  K  --    < > 4.3 3.7  CL  --    < > 104 112*  CO2  --   --  23 27  GLUCOSE  --    < > 150* 99  BUN  --    < > 43* 39*  CREATININE  --    < > 2.87* 2.49*  CALCIUM  --   --  8.6* 8.6*  MG 2.5*  --  2.6*  --   PHOS  --   --  5.7*  --    < > = values in this interval not displayed.   Liver Function Tests Recent  Labs    04/08/21 0313  AST 28  ALT 27  ALKPHOS 81  BILITOT 1.9*  PROT 6.1*  ALBUMIN 3.0*   Recent Labs    04/08/21 0313  LIPASE 25   Cardiac Enzymes No results for input(s): CKTOTAL, CKMB, CKMBINDEX, TROPONINI in the last 72 hours.  BNP: BNP (last 3 results) Recent Labs    09/09/20 0952 04/08/21 0325  BNP 2,121.4* 2,679.7*    ProBNP (last 3 results) No results for input(s): PROBNP in the last 8760 hours.   D-Dimer No results for input(s): DDIMER in the last 72 hours. Hemoglobin A1C No results for input(s): HGBA1C in the last 72 hours. Fasting Lipid Panel No results for input(s): CHOL, HDL, LDLCALC, TRIG, CHOLHDL, LDLDIRECT in the last 72 hours. Thyroid Function Tests No results for input(s): TSH, T4TOTAL, T3FREE, THYROIDAB in the last 72 hours.  Invalid input(s): FREET3  Other results:   Imaging    CT ABDOMEN WO CONTRAST  Addendum Date: 04/09/2021   ADDENDUM REPORT: 04/09/2021 13:29 ADDENDUM: Findings outlined in the initial impression were discussed with  the provider as outlined below. In addition to findings related to perforation of gastric ulcer, the marked distortion in thickening of the antrum and pylorus is suggestive of neoplasm which is strongly considered in the working differential at this time based on appearance. Marked distortion from ulcer disease is also considered but follow-up will be important in this patient to exclude underlying neoplasm. These results were called by telephone at the time of interpretation on 04/09/2021 at 1:29 pm to provider Ina Homes , who verbally acknowledged these results. Electronically Signed   By: Zetta Bills M.D.   On: 04/09/2021 13:29   Result Date: 04/09/2021 CLINICAL DATA:  Peritonitis, suspected gastric ulcer perforation in a 71 year old male. EXAM: CT ABDOMEN WITHOUT CONTRAST TECHNIQUE: Multidetector CT imaging of the abdomen was performed following the standard protocol without IV contrast. COMPARISON:  CT of the chest, abdomen and pelvis of April 08, 2021. FINDINGS: Lower chest: There are trace bilateral effusions and there is evidence of basilar atelectasis. Hepatobiliary: Liver with smooth contours. Likely with sludge in the gallbladder lumen. Pancreas: Pancreatic atrophy without signs of focal inflammation. Spleen: Spleen normal size and contour. Adrenals/Urinary Tract: Adrenal glands are normal. Perinephric stranding bilaterally with low-density renal lesions similar to prior imaging. No hydronephrosis. Area of internal calcification in the upper pole lesion on the LEFT is unchanged area measuring approximately 2.3 cm. The low-density lesion in the interpolar LEFT kidney is homogeneous low-density in likely a cyst. Stomach/Bowel: Focal outpouching compatible with gastric ulcer with perforation, largely contained with respect to contrast on the current study in the area of the perforation. There is distortion the gastric antrum which is similar to the prior CT. Area measuring approximately 16 x 9 mm.  Subtle higher density material tracks along the lesser curvature in an oblique fashion seen on images 53 through 59. There is stranding in the lesser sac. Gas in the inter lobar fissure of the liver and fissure for ductus venosus has resolved. However, there is still a small locule of gas in the LEFT upper quadrant. There is no increase in upper abdominal pneumoperitoneum since the previous study. Fluid adjacent to the greater curvature of the stomach at 6.5 x 3.2 cm measures simple fluid density slightly increased when compared to the prior study at 5.5 x 3.1 cm on the previous exam. Higher density material may track towards this along the posterior wall of the stomach but there is  substantial streak artifact due to administered contrast material. Stranding about small bowel loops in the LEFT upper quadrant and colon. Vascular/Lymphatic: Atherosclerotic changes of the abdominal aorta without aneurysmal dilation. Other: Focal fluid along the greater curvature of the stomach with adjacent locules of pneumoperitoneum and extension towards the under surface of the LEFT hemidiaphragm as described. Musculoskeletal: Spinal degenerative changes without acute or destructive bone process. IMPRESSION: Gastric ulcer along the lesser curvature of the gastric antrum with signs of perforation, largely contained to the lesser sac and largely contained to the area along the lesser curvature. However, there is a subtle tract of high density material extending from this area that may represent tiny residual extravasation beyond the area contained perforation/ulceration. Slight increase in fluid in the LEFT upper quadrant along the greater curvature of the stomach with small locules of pneumoperitoneum but with resolution of pneumoperitoneum seen in the intrahepatic fissures on the prior study. Intense inflammation about this area in general as described. Distortion of the stomach and irregularity of the gastric wall does not allow  for exclusion of underlying neoplasm in this location. Trace effusions and basilar airspace disease. Aortic Atherosclerosis (ICD10-I70.0). Electronically Signed: By: Zetta Bills M.D. On: 04/09/2021 13:23   DG CHEST PORT 1 VIEW  Result Date: 04/09/2021 CLINICAL DATA:  Central line placement. EXAM: PORTABLE CHEST 1 VIEW COMPARISON:  Chest x-ray 04/08/2021. FINDINGS: Right-sided central venous catheter tip projects over the brachiocephalic SVC junction. Left-sided central venous catheter has been removed in the interval. There is no pneumothorax. Left-sided ICD is unchanged in position. The heart is enlarged. Central pulmonary vascular congestion and interstitial opacities persist. There is no definite pleural effusion, although costophrenic angles have been excluded from the exam. There is no new focal lung infiltrate. No acute fractures are seen. IMPRESSION: 1. New right-sided central venous catheter tip projects over the brachiocephalic SVC junction. 2. Left-sided central venous catheter has been removed. 3. Stable cardiomegaly with mild interstitial edema. Electronically Signed   By: Ronney Asters M.D.   On: 04/09/2021 16:24   DG UGI W SINGLE CM (SOL OR THIN BA)  Result Date: 04/09/2021 CLINICAL DATA:  A 70 year old male presents with suspected perforated gastric ulcer. EXAM: DG UGI W SINGLE CM TECHNIQUE: Scout radiograph was obtained. Single contrast examination was performed using thin liquid barium. This exam was performed by Luther Parody, and was supervised and interpreted by Zetta Bills. FLUOROSCOPY TIME:  Radiation Exposure Index (as provided by the fluoroscopic device): 54 seconds If the device does not provide the exposure index: Fluoroscopy Time:  19.1 mGy Number of Acquired Images:  5 COMPARISON:  NONE. FINDINGS: Scout Radiograph: Single scout image obtained without increased density in the area of concern along the distal stomach. EKG leads project over the abdomen. Spinal degenerative  changes are noted. Esophagus: Esophagus not assessed on the current study which was focused on the area of concern about the stomach. Stomach: Stomach with signs of distortion along the superior antrum. Focal outpouching of contrast demonstrated on the current study at the site of suspected perforation seen on recent CT. Subtle linear area tracking away from this location suggested on lateral projections this did not changed substantially over a series of maneuvers attempting to increase the flow across the area with lateral and oblique positioning. The collection of contrast beyond the gastric lumen measures 11 x 8 mm at maximal dimension. This study was followed with CT imaging as outlined in subsequent CT report. Emptying of the stomach was not impaired and contrast  filled the duodenum. IMPRESSION: Large gastric ulcer with largely contained area of perforation along the greater curvature of the stomach with a linear band of contrast that is quite subtle seen on oblique and lateral projections that may extend along the lesser curvature but seen only on one view, see subsequent CT imaging for further detail. Electronically Signed   By: Zetta Bills M.D.   On: 04/09/2021 13:06   ECHOCARDIOGRAM COMPLETE  Result Date: 04/09/2021    ECHOCARDIOGRAM REPORT   Patient Name:   Anthony Rubio. Date of Exam: 04/09/2021 Medical Rec #:  [id number]       Height:       76.0 in Accession #:    [id number]      Weight:       205.7 lb Date of Birth:  [birth year]       BSA:          2.242 m Patient Age:    49 years        BP:           78/33 mmHg Patient Gender: M               HR:           71 bpm. Exam Location:  Inpatient Procedure: 2D Echo, Color Doppler and Cardiac Doppler Indications:    CHF  History:        Patient has prior history of Echocardiogram examinations, most                 recent 06/21/2020. ICD, CAD, CKD; Risk Factors:Hypertension,                 Current Smoker and Diabetes.  Sonographer:    Maudry Mayhew  MHA, RDMS, RVT, RDCS Referring Phys: [id number] SIMMONS  Sonographer Comments: Image acquisition challenging due to respiratory motion and patient movement, talking. IMPRESSIONS  1. Left ventricular ejection fraction, by estimation, is <20%. The left ventricle has severely decreased function. The left ventricle demonstrates global hypokinesis with regional variation. The left ventricular internal cavity size was severely dilated. Left ventricular diastolic parameters are consistent with Grade III diastolic dysfunction (restrictive). Elevated left ventricular end-diastolic pressure. The E/e' is 56.  2. Right ventricular systolic function is mildly reduced. The right ventricular size is mildly enlarged. There is moderately elevated pulmonary artery systolic pressure. The estimated right ventricular systolic pressure is 123XX123 mmHg.  3. Left atrial size was severely dilated.  4. The mitral valve is abnormal. Mild mitral valve regurgitation.  5. Tricuspid valve regurgitation is mild to moderate.  6. The aortic valve has an indeterminant number of cusps. There is moderate calcification of the aortic valve. There is moderate thickening of the aortic valve. Aortic valve regurgitation is not visualized. Mild to moderate aortic valve stenosis. Aortic  valve area, by VTI measures 1.52 cm. Aortic valve mean gradient measures 5.0 mmHg. Aortic valve Vmax measures 1.56 m/s.  7. The inferior vena cava is dilated in size with <50% respiratory variability, suggesting right atrial pressure of 15 mmHg.  8. Right atrial size was moderately dilated. Comparison(s): Changes from prior study are noted. 06/21/2020: LVEF <20%. FINDINGS  Left Ventricle: Left ventricular ejection fraction, by estimation, is <20%. The left ventricle has severely decreased function. The left ventricle demonstrates global hypokinesis. The left ventricular internal cavity size was severely dilated. There is no left ventricular hypertrophy. Left ventricular  diastolic parameters are consistent with Grade III diastolic dysfunction (restrictive). Elevated left ventricular  end-diastolic pressure. The E/e' is 43. Right Ventricle: The right ventricular size is mildly enlarged. No increase in right ventricular wall thickness. Right ventricular systolic function is mildly reduced. There is moderately elevated pulmonary artery systolic pressure. The tricuspid regurgitant velocity is 3.23 m/s, and with an assumed right atrial pressure of 15 mmHg, the estimated right ventricular systolic pressure is 123XX123 mmHg. Left Atrium: Left atrial size was severely dilated. Right Atrium: Right atrial size was moderately dilated. Pericardium: There is no evidence of pericardial effusion. Mitral Valve: The mitral valve is abnormal. There is mild thickening of the mitral valve leaflet(s). Mild mitral valve regurgitation. Tricuspid Valve: The tricuspid valve is grossly normal. Tricuspid valve regurgitation is mild to moderate. Aortic Valve: The aortic valve has an indeterminant number of cusps. There is moderate calcification of the aortic valve. There is moderate thickening of the aortic valve. Aortic valve regurgitation is not visualized. Mild to moderate aortic stenosis is present. Aortic valve mean gradient measures 5.0 mmHg. Aortic valve peak gradient measures 9.7 mmHg. Aortic valve area, by VTI measures 1.52 cm. Pulmonic Valve: The pulmonic valve was grossly normal. Pulmonic valve regurgitation is trivial. Aorta: The aortic root and ascending aorta are structurally normal, with no evidence of dilitation. Venous: The inferior vena cava is dilated in size with less than 50% respiratory variability, suggesting right atrial pressure of 15 mmHg. IAS/Shunts: No atrial level shunt detected by color flow Doppler. Additional Comments: A device lead is visualized.  LEFT VENTRICLE PLAX 2D LVIDd:         7.30 cm      Diastology LVIDs:         6.90 cm      LV e' medial:   2.95 cm/s LV PW:         0.80  cm      LV E/e' medial: 32.2 LV IVS:        0.50 cm LVOT diam:     2.60 cm LV SV:         44 LV SV Index:   20 LVOT Area:     5.31 cm  LV Volumes (MOD) LV vol d, MOD A2C: 213.0 ml LV vol d, MOD A4C: 158.0 ml LV vol s, MOD A2C: 166.0 ml LV vol s, MOD A4C: 134.0 ml LV SV MOD A2C:     47.0 ml LV SV MOD A4C:     158.0 ml LV SV MOD BP:      34.4 ml RIGHT VENTRICLE TAPSE (M-mode): 1.7 cm LEFT ATRIUM              Index       RIGHT ATRIUM           Index LA diam:        4.40 cm  1.96 cm/m  RA Area:     26.90 cm LA Vol (A2C):   105.0 ml 46.84 ml/m RA Volume:   89.70 ml  40.01 ml/m LA Vol (A4C):   115.0 ml 51.30 ml/m LA Biplane Vol: 112.0 ml 49.96 ml/m  AORTIC VALVE AV Area (Vmax):    1.52 cm AV Area (Vmean):   1.45 cm AV Area (VTI):     1.52 cm AV Vmax:           155.50 cm/s AV Vmean:          105.350 cm/s AV VTI:            0.288 m AV Peak Grad:  9.7 mmHg AV Mean Grad:      5.0 mmHg LVOT Vmax:         44.40 cm/s LVOT Vmean:        28.800 cm/s LVOT VTI:          0.082 m LVOT/AV VTI ratio: 0.29  AORTA Ao Root diam: 3.60 cm MITRAL VALVE               TRICUSPID VALVE MV Area (PHT): 5.13 cm    TR Peak grad:   41.7 mmHg MV Decel Time: 148 msec    TR Vmax:        323.00 cm/s MR Peak grad: 45.8 mmHg MR Vmax:      338.50 cm/s  SHUNTS MV E velocity: 95.10 cm/s  Systemic VTI:  0.08 m MV A velocity: 28.70 cm/s  Systemic Diam: 2.60 cm MV E/A ratio:  3.31 Lyman Bishop MD Electronically signed by Lyman Bishop MD Signature Date/Time: 04/09/2021/3:43:08 PM    Final      Medications:     Scheduled Medications:  Chlorhexidine Gluconate Cloth  6 each Topical Daily   mupirocin ointment   Topical BID   pantoprazole  40 mg Intravenous Q12H   sodium chloride flush  10-40 mL Intracatheter Q12H    Infusions:  sodium chloride Stopped (04/08/21 0713)   DOBUTamine 2.5 mcg/kg/min (04/10/21 0600)   norepinephrine (LEVOPHED) Adult infusion Stopped (04/09/21 2211)   piperacillin-tazobactam (ZOSYN)  IV 12.5 mL/hr at 04/10/21  0600    PRN Medications: sodium chloride flush   Assessment/Plan   1. Septic Shock/ Perforated Bowel  - ? If 2/2 low output from severe biventricular failure  - GS following for potential surgery, though would be high risk  - improving w/ conservative management w/ abx and bowel rest. No indication for emergent surgical intervention at this point  - GS consider UGI to evaluate for possible contained perforation if pt willing to proceed  - 10/4 -->BCx NGTD  -WBC trending down 7.2. On ZOsyn  - If +BCx, will need TEE given new device lead    2. Chronic Biventricular Heart Failure  - mixed ischemic/nonischemic cardiomyopathy - recent worsening HF w/ increase in RV pacing s/p recent device upgrade to CRT-D 9/8 - TEE 4/22 EF <20%, RV moderately reduced  - Overall poor functional status, chronically NYHA Class IIIb at baseline  - Echo  10/5 EF < 20%.  - CO-OX 49% and was started on dobutamine 2.5 mcg. Norepi stopped.  - CVP 3. Volume status stable.  - Holding HF meds/GDMT w/ hypotension   - Renal function trending down.   3. CAD:  - LHC in 2011 showed large, ectatic coronaries with slow flow and moderate to severe diffuse distal vessel disease (consistent with diabetes) without good interventional options. Treated medically.  - denies any recent ischemic like CP, though poor functional status at baseline  - No chest pain.  - Not on ASA w/ Eliquis use for PAF - hold ? blocker w/ hypotension/shock  - holding statin given NPO for perforated bowel    3. PAF:  - Maintaining SR HR controlled    4. Pre-operative Assessment  - no recent ischemic chest pain, though poor functional status at baseline. Unable to complete > 4METs of physical activity, limited by chronic HF symptoms  - Would be high risk for perioperative complications given severe biventricular HF  - EF < 20%.  - hopefully will continue to improve w/ conservative management.   5. Stage IV CKD  -  SCr baseline ~2.5.  Creatinine 2.9>2.5  - Currently NPO, may be contributing.  - also possible cardiorenal/low output   6. Prolonged QT Check EKG now.   7. Hypernatremia  Sodium 149    Length of Stay: 2  Amy Clegg, NP  04/10/2021, 7:48 AM  Advanced Heart Failure Team Pager [phone] (M-F; 7a - 5p)  Please contact [facility] for night-coverage after hours (5p -7a ) and weekends on [email or web address]  Patient seen with NP, agree with the above note.   No abdominal pain or nausea.  No dyspnea.    CVP 3, co-ox up to 64%.  Now on dobutamine and off NE.  Creatinine now 2.5.   General: NAD Neck: No JVD, no thyromegaly or thyroid nodule.  Lungs: Clear to auscultation bilaterally with normal respiratory effort. CV: Nondisplaced PMI.  Heart regular S1/S2, no S3/S4, no murmur.  No peripheral edema.   Abdomen: Soft, nontender, no hepatosplenomegaly, no distention.  Skin: Intact without lesions or rashes.  Neurologic: Alert and oriented x 3.  Psych: Normal affect. Extremities: No clubbing or cyanosis.  HEENT: Normal.   Stable from cardiac standpoint on dobutamine 2.5 mcg/kg/min with good co-ox.  Continue for now.  Will titrate off eventually.  He does not need diuresis at this time.   He is maintaining NSR with biventricular pacing.  He was on apixaban at home.   He has a contained perforation of a gastric ulcer from PUD versus gastric cancer.  Surgery saw today, plan to treat with abx, PPI, and bowel rest. If ulcer does not heal, will need EGD with biopsy to make diagnosis.   [name] 04/10/2021 1:09 PM

## 2021-04-10 NOTE — Progress Notes (Signed)
General Surgery Follow Up Note  Subjective:    Overnight Issues:   Objective:  Vital signs for last 24 hours: Temp:  [97.6 F (36.4 C)-98.8 F (37.1 C)] 97.6 F (36.4 C) (10/06 0748) Pulse Rate:  [65-73] 70 (10/06 0400) Resp:  [14-29] 17 (10/06 0400) BP: (78-151)/(33-95) 99/74 (10/06 0400) SpO2:  [84 %-98 %] 95 % (10/06 0400)  Hemodynamic parameters for last 24 hours: CVP:  [4 mmHg-5 mmHg] 5 mmHg  Intake/Output from previous day: 10/05 0701 - 10/06 0700 In: 374.6 [I.V.:230.7; IV Piggyback:143.9] Out: 875 [Urine:875]  Intake/Output this shift: No intake/output data recorded.  Vent settings for last 24 hours:    Physical Exam:  Gen: comfortable, no distress Neuro: non-focal exam HEENT: PERRL Neck: supple CV: RRR Pulm: unlabored breathing Abd: soft, NT GU: clear yellow urine Extr: wwp, no edema   Results for orders placed or performed during the hospital encounter of 04/08/21 (from the past 24 hour(s))  Lactic acid, plasma     Status: Abnormal   Collection Time: 04/09/21 11:10 AM  Result Value Ref Range   Lactic Acid, Venous 2.3 (HH) 0.5 - 1.9 mmol/L  Glucose, capillary     Status: Abnormal   Collection Time: 04/09/21  4:02 PM  Result Value Ref Range   Glucose-Capillary 117 (H) 70 - 99 mg/dL  .Cooxemetry Panel (carboxy, met, total hgb, O2 sat)     Status: Abnormal   Collection Time: 04/09/21  4:40 PM  Result Value Ref Range   Total hemoglobin 17.3 (H) 12.0 - 16.0 g/dL   O2 Saturation 49.0 %   Carboxyhemoglobin 1.3 0.5 - 1.5 %   Methemoglobin 0.7 0.0 - 1.5 %  .Cooxemetry Panel (carboxy, met, total hgb, O2 sat)     Status: Abnormal   Collection Time: 04/10/21  5:56 AM  Result Value Ref Range   Total hemoglobin 16.4 (H) 12.0 - 16.0 g/dL   O2 Saturation 64.0 %   Carboxyhemoglobin 1.4 0.5 - 1.5 %   Methemoglobin 0.7 0.0 - 1.5 %  Basic metabolic panel     Status: Abnormal   Collection Time: 04/10/21  5:57 AM  Result Value Ref Range   Sodium 149 (H) 135 -  145 mmol/L   Potassium 3.7 3.5 - 5.1 mmol/L   Chloride 112 (H) 98 - 111 mmol/L   CO2 27 22 - 32 mmol/L   Glucose, Bld 99 70 - 99 mg/dL   BUN 39 (H) 8 - 23 mg/dL   Creatinine, Ser 2.49 (H) 0.61 - 1.24 mg/dL   Calcium 8.6 (L) 8.9 - 10.3 mg/dL   GFR, Estimated 27 (L) >60 mL/min   Anion gap 10 5 - 15  CBC with Differential/Platelet     Status: Abnormal   Collection Time: 04/10/21  5:57 AM  Result Value Ref Range   WBC 7.2 4.0 - 10.5 K/uL   RBC 5.37 4.22 - 5.81 MIL/uL   Hemoglobin 16.2 13.0 - 17.0 g/dL   HCT 51.3 39.0 - 52.0 %   MCV 95.5 80.0 - 100.0 fL   MCH 30.2 26.0 - 34.0 pg   MCHC 31.6 30.0 - 36.0 g/dL   RDW 16.4 (H) 11.5 - 15.5 %   Platelets 88 (L) 150 - 400 K/uL   nRBC 0.0 0.0 - 0.2 %   Neutrophils Relative % 79 %   Neutro Abs 5.6 1.7 - 7.7 K/uL   Lymphocytes Relative 11 %   Lymphs Abs 0.8 0.7 - 4.0 K/uL   Monocytes Relative 10 %  Monocytes Absolute 0.7 0.1 - 1.0 K/uL   Eosinophils Relative 0 %   Eosinophils Absolute 0.0 0.0 - 0.5 K/uL   Basophils Relative 0 %   Basophils Absolute 0.0 0.0 - 0.1 K/uL   WBC Morphology MORPHOLOGY UNREMARKABLE    Smear Review MORPHOLOGY UNREMARKABLE    Immature Granulocytes 0 %   Abs Immature Granulocytes 0.02 0.00 - 0.07 K/uL   Burr Cells PRESENT     Assessment & Plan:  Present on Admission:  Bowel perforation ([place])    LOS: 2 days   Additional comments:I reviewed the patient's new clinical lab test results.   and I reviewed the patients new imaging test results.    Contained gastric perforation - visible on UGI 10/5 with associated moderate sized gastric ulcer. Given size of ulcer and associated gastric thickening, gastric cancer is on the differential and would recommend evaluation of this. In the setting of contained gastric perf, endoscopic biopsy is suboptimal and given cardiac history, open biopsy is also suboptimal. Recommend continued non-operative management, cont NPO, repeat UGI in a few days and if stable/resolved, trial of  PO, and plan for EGD with bx in 4-6w. Non-operative management may fail and this would increase the probability of malignant etiology. Patient is reportedly having difficulty understanding/appreciating the complexity of this clinical condition and has attempted to leave AMA. Recommend palliative c/s for assistance in helping patient understand the gravity of the clinical scenario and clarify goals of care.  Dispo - per primary    Jesusita Oka, MD Trauma & General Surgery Please use [email or web address] to contact on call provider  04/10/2021  *Care during the described time interval was provided by me. I have reviewed this patient's available data, including medical history, events of note, physical examination and test results as part of my evaluation.

## 2021-04-11 DIAGNOSIS — A419 Sepsis, unspecified organism: Secondary | ICD-10-CM | POA: Diagnosis not present

## 2021-04-11 DIAGNOSIS — K631 Perforation of intestine (nontraumatic): Secondary | ICD-10-CM | POA: Diagnosis not present

## 2021-04-11 DIAGNOSIS — K252 Acute gastric ulcer with both hemorrhage and perforation: Secondary | ICD-10-CM | POA: Diagnosis not present

## 2021-04-11 DIAGNOSIS — J9601 Acute respiratory failure with hypoxia: Secondary | ICD-10-CM | POA: Diagnosis not present

## 2021-04-11 DIAGNOSIS — R6520 Severe sepsis without septic shock: Secondary | ICD-10-CM | POA: Diagnosis not present

## 2021-04-11 LAB — CBC WITH DIFFERENTIAL/PLATELET
Abs Immature Granulocytes: 0.03 10*3/uL (ref 0.00–0.07)
Basophils Absolute: 0 10*3/uL (ref 0.0–0.1)
Basophils Relative: 0 %
Eosinophils Absolute: 0 10*3/uL (ref 0.0–0.5)
Eosinophils Relative: 1 %
HCT: 52.6 % — ABNORMAL HIGH (ref 39.0–52.0)
Hemoglobin: 16.4 g/dL (ref 13.0–17.0)
Immature Granulocytes: 1 %
Lymphocytes Relative: 11 %
Lymphs Abs: 0.6 10*3/uL — ABNORMAL LOW (ref 0.7–4.0)
MCH: 30.2 pg (ref 26.0–34.0)
MCHC: 31.2 g/dL (ref 30.0–36.0)
MCV: 96.9 fL (ref 80.0–100.0)
Monocytes Absolute: 0.5 10*3/uL (ref 0.1–1.0)
Monocytes Relative: 9 %
Neutro Abs: 4.7 10*3/uL (ref 1.7–7.7)
Neutrophils Relative %: 78 %
Platelets: 93 10*3/uL — ABNORMAL LOW (ref 150–400)
RBC: 5.43 MIL/uL (ref 4.22–5.81)
RDW: 16.1 % — ABNORMAL HIGH (ref 11.5–15.5)
WBC: 5.9 10*3/uL (ref 4.0–10.5)
nRBC: 0 % (ref 0.0–0.2)

## 2021-04-11 LAB — COOXEMETRY PANEL
Carboxyhemoglobin: 1.4 % (ref 0.5–1.5)
Methemoglobin: 0.7 % (ref 0.0–1.5)
O2 Saturation: 64.2 %
Total hemoglobin: 16.8 g/dL — ABNORMAL HIGH (ref 12.0–16.0)

## 2021-04-11 LAB — BASIC METABOLIC PANEL
Anion gap: 9 (ref 5–15)
BUN: 36 mg/dL — ABNORMAL HIGH (ref 8–23)
CO2: 25 mmol/L (ref 22–32)
Calcium: 8.5 mg/dL — ABNORMAL LOW (ref 8.9–10.3)
Chloride: 115 mmol/L — ABNORMAL HIGH (ref 98–111)
Creatinine, Ser: 1.98 mg/dL — ABNORMAL HIGH (ref 0.61–1.24)
GFR, Estimated: 35 mL/min — ABNORMAL LOW (ref >60)
Glucose, Bld: 99 mg/dL (ref 70–99)
Potassium: 3.7 mmol/L (ref 3.5–5.1)
Sodium: 149 mmol/L — ABNORMAL HIGH (ref 135–145)

## 2021-04-11 LAB — GLUCOSE, CAPILLARY
Glucose-Capillary: 103 mg/dL — ABNORMAL HIGH (ref 70–99)
Glucose-Capillary: 128 mg/dL — ABNORMAL HIGH (ref 70–99)
Glucose-Capillary: 63 mg/dL — ABNORMAL LOW (ref 70–99)

## 2021-04-11 MED ORDER — DEXTROSE 50 % IV SOLN: 25.0000 mL | Freq: Once | INTRAVENOUS | Status: AC

## 2021-04-11 MED ORDER — DEXTROSE 50 % IV SOLN: 12.5000 g | INTRAVENOUS | Status: AC

## 2021-04-11 MED ORDER — DEXTROSE 50 % IV SOLN
INTRAVENOUS | Status: AC
  Administered 2021-04-11: 50 mL
  Filled 2021-04-11: qty 50

## 2021-04-11 MED ORDER — POTASSIUM CL IN DEXTROSE 5% 20 MEQ/L IV SOLN
20.0000 meq | INTRAVENOUS | Status: DC
  Administered 2021-04-11: 20 meq via INTRAVENOUS
  Filled 2021-04-11: qty 1000

## 2021-04-11 MED ORDER — BOOST / RESOURCE BREEZE PO LIQD CUSTOM
1.0000 | Freq: Three times a day (TID) | ORAL | Status: DC
Start: 2021-04-11 — End: 2021-04-12
  Administered 2021-04-11 – 2021-04-12 (×4): 1 via ORAL

## 2021-04-11 NOTE — Progress Notes (Signed)
Progress Note  3 Days Post-Op  Subjective: Pt irritable this AM and reports he just wants to do whatever he can do for all of this from home. Seems to lack some insight into overall situation. Wife was not present at bedside this AM.   Objective: Vital signs in last 24 hours: Temp:  [97.6 F (36.4 C)-98.1 F (36.7 C)] 98.1 F (36.7 C) (10/07 0754) Pulse Rate:  [66-81] 70 (10/07 0800) Resp:  [5-24] 12 (10/07 0800) BP: (80-117)/(64-85) 111/85 (10/07 0800) SpO2:  [85 %-99 %] 91 % (10/07 0800) Weight:  [92 kg] 92 kg (10/07 0500) Last BM Date: 04/10/21  Intake/Output from previous day: 10/06 0701 - 10/07 0700 In: 404.3 [I.V.:163.5; IV Piggyback:240.9] Out: 976 [Urine:976] Intake/Output this shift: No intake/output data recorded.  PE: General: WD, elderly male who is laying in bed in NAD Heart: regular, rate, and rhythm.  Lungs:  Respiratory effort nonlabored Abd: soft, NT, ND, +BS Psych: A&Ox3 with an irritable affect.    Lab Results:  Recent Labs    04/10/21 0557 04/11/21 0520  WBC 7.2 5.9  HGB 16.2 16.4  HCT 51.3 52.6*  PLT 88* 93*   BMET Recent Labs    04/10/21 0557 04/11/21 0645  NA 149* 149*  K 3.7 3.7  CL 112* 115*  CO2 27 25  GLUCOSE 99 99  BUN 39* 36*  CREATININE 2.49* 1.98*  CALCIUM 8.6* 8.5*   PT/INR No results for input(s): LABPROT, INR in the last 72 hours. CMP     Component Value Date/Time   NA 149 (H) 04/11/2021 0645   NA 140 03/07/2021 0833   K 3.7 04/11/2021 0645   CL 115 (H) 04/11/2021 0645   CO2 25 04/11/2021 0645   GLUCOSE 99 04/11/2021 0645   BUN 36 (H) 04/11/2021 0645   BUN 40 (H) 03/07/2021 0833   CREATININE 1.98 (H) 04/11/2021 0645   CREATININE 1.67 (H) 08/29/2015 0751   CALCIUM 8.5 (L) 04/11/2021 0645   PROT 6.1 (L) 04/08/2021 0313   ALBUMIN 3.0 (L) 04/08/2021 0313   AST 28 04/08/2021 0313   ALT 27 04/08/2021 0313   ALKPHOS 81 04/08/2021 0313   BILITOT 1.9 (H) 04/08/2021 0313   GFRNONAA 35 (L) 04/11/2021 0645    GFRAA 39 (L) 05/15/2019 0911   Lipase     Component Value Date/Time   LIPASE 25 04/08/2021 0313       Studies/Results: CT ABDOMEN WO CONTRAST  Addendum Date: 04/09/2021   ADDENDUM REPORT: 04/09/2021 13:29 ADDENDUM: Findings outlined in the initial impression were discussed with the provider as outlined below. In addition to findings related to perforation of gastric ulcer, the marked distortion in thickening of the antrum and pylorus is suggestive of neoplasm which is strongly considered in the working differential at this time based on appearance. Marked distortion from ulcer disease is also considered but follow-up will be important in this patient to exclude underlying neoplasm. These results were called by telephone at the time of interpretation on 04/09/2021 at 1:29 pm to provider Ina Homes , who verbally acknowledged these results. Electronically Signed   By: Zetta Bills M.D.   On: 04/09/2021 13:29   Result Date: 04/09/2021 CLINICAL DATA:  Peritonitis, suspected gastric ulcer perforation in a 70 year old male. EXAM: CT ABDOMEN WITHOUT CONTRAST TECHNIQUE: Multidetector CT imaging of the abdomen was performed following the standard protocol without IV contrast. COMPARISON:  CT of the chest, abdomen and pelvis of April 08, 2021. FINDINGS: Lower chest: There are trace  bilateral effusions and there is evidence of basilar atelectasis. Hepatobiliary: Liver with smooth contours. Likely with sludge in the gallbladder lumen. Pancreas: Pancreatic atrophy without signs of focal inflammation. Spleen: Spleen normal size and contour. Adrenals/Urinary Tract: Adrenal glands are normal. Perinephric stranding bilaterally with low-density renal lesions similar to prior imaging. No hydronephrosis. Area of internal calcification in the upper pole lesion on the LEFT is unchanged area measuring approximately 2.3 cm. The low-density lesion in the interpolar LEFT kidney is homogeneous low-density in likely a cyst.  Stomach/Bowel: Focal outpouching compatible with gastric ulcer with perforation, largely contained with respect to contrast on the current study in the area of the perforation. There is distortion the gastric antrum which is similar to the prior CT. Area measuring approximately 16 x 9 mm. Subtle higher density material tracks along the lesser curvature in an oblique fashion seen on images 53 through 59. There is stranding in the lesser sac. Gas in the inter lobar fissure of the liver and fissure for ductus venosus has resolved. However, there is still a small locule of gas in the LEFT upper quadrant. There is no increase in upper abdominal pneumoperitoneum since the previous study. Fluid adjacent to the greater curvature of the stomach at 6.5 x 3.2 cm measures simple fluid density slightly increased when compared to the prior study at 5.5 x 3.1 cm on the previous exam. Higher density material may track towards this along the posterior wall of the stomach but there is substantial streak artifact due to administered contrast material. Stranding about small bowel loops in the LEFT upper quadrant and colon. Vascular/Lymphatic: Atherosclerotic changes of the abdominal aorta without aneurysmal dilation. Other: Focal fluid along the greater curvature of the stomach with adjacent locules of pneumoperitoneum and extension towards the under surface of the LEFT hemidiaphragm as described. Musculoskeletal: Spinal degenerative changes without acute or destructive bone process. IMPRESSION: Gastric ulcer along the lesser curvature of the gastric antrum with signs of perforation, largely contained to the lesser sac and largely contained to the area along the lesser curvature. However, there is a subtle tract of high density material extending from this area that may represent tiny residual extravasation beyond the area contained perforation/ulceration. Slight increase in fluid in the LEFT upper quadrant along the greater curvature  of the stomach with small locules of pneumoperitoneum but with resolution of pneumoperitoneum seen in the intrahepatic fissures on the prior study. Intense inflammation about this area in general as described. Distortion of the stomach and irregularity of the gastric wall does not allow for exclusion of underlying neoplasm in this location. Trace effusions and basilar airspace disease. Aortic Atherosclerosis (ICD10-I70.0). Electronically Signed: By: Zetta Bills M.D. On: 04/09/2021 13:23   DG CHEST PORT 1 VIEW  Result Date: 04/09/2021 CLINICAL DATA:  Central line placement. EXAM: PORTABLE CHEST 1 VIEW COMPARISON:  Chest x-ray 04/08/2021. FINDINGS: Right-sided central venous catheter tip projects over the brachiocephalic SVC junction. Left-sided central venous catheter has been removed in the interval. There is no pneumothorax. Left-sided ICD is unchanged in position. The heart is enlarged. Central pulmonary vascular congestion and interstitial opacities persist. There is no definite pleural effusion, although costophrenic angles have been excluded from the exam. There is no new focal lung infiltrate. No acute fractures are seen. IMPRESSION: 1. New right-sided central venous catheter tip projects over the brachiocephalic SVC junction. 2. Left-sided central venous catheter has been removed. 3. Stable cardiomegaly with mild interstitial edema. Electronically Signed   By: Tina Griffiths.D.  On: 04/09/2021 16:24   DG UGI W SINGLE CM (SOL OR THIN BA)  Result Date: 04/09/2021 CLINICAL DATA:  A 70 year old male presents with suspected perforated gastric ulcer. EXAM: DG UGI W SINGLE CM TECHNIQUE: Scout radiograph was obtained. Single contrast examination was performed using thin liquid barium. This exam was performed by Luther Parody, and was supervised and interpreted by Zetta Bills. FLUOROSCOPY TIME:  Radiation Exposure Index (as provided by the fluoroscopic device): 54 seconds If the device does not  provide the exposure index: Fluoroscopy Time:  19.1 mGy Number of Acquired Images:  5 COMPARISON:  NONE. FINDINGS: Scout Radiograph: Single scout image obtained without increased density in the area of concern along the distal stomach. EKG leads project over the abdomen. Spinal degenerative changes are noted. Esophagus: Esophagus not assessed on the current study which was focused on the area of concern about the stomach. Stomach: Stomach with signs of distortion along the superior antrum. Focal outpouching of contrast demonstrated on the current study at the site of suspected perforation seen on recent CT. Subtle linear area tracking away from this location suggested on lateral projections this did not changed substantially over a series of maneuvers attempting to increase the flow across the area with lateral and oblique positioning. The collection of contrast beyond the gastric lumen measures 11 x 8 mm at maximal dimension. This study was followed with CT imaging as outlined in subsequent CT report. Emptying of the stomach was not impaired and contrast filled the duodenum. IMPRESSION: Large gastric ulcer with largely contained area of perforation along the greater curvature of the stomach with a linear band of contrast that is quite subtle seen on oblique and lateral projections that may extend along the lesser curvature but seen only on one view, see subsequent CT imaging for further detail. Electronically Signed   By: Zetta Bills M.D.   On: 04/09/2021 13:06   ECHOCARDIOGRAM COMPLETE  Result Date: 04/09/2021    ECHOCARDIOGRAM REPORT   Patient Name:   Bryndon Kusiak. Date of Exam: 04/09/2021 Medical Rec #:  [id number]       Height:       76.0 in Accession #:    [id number]      Weight:       205.7 lb Date of Birth:  01/08/1951       BSA:          2.242 m Patient Age:    63 years        BP:           78/33 mmHg Patient Gender: M               HR:           71 bpm. Exam Location:  Inpatient Procedure: 2D Echo,  Color Doppler and Cardiac Doppler Indications:    CHF  History:        Patient has prior history of Echocardiogram examinations, most                 recent 06/21/2020. ICD, CAD, CKD; Risk Factors:Hypertension,                 Current Smoker and Diabetes.  Sonographer:    Maudry Mayhew MHA, RDMS, RVT, RDCS Referring Phys: [id number] SIMMONS  Sonographer Comments: Image acquisition challenging due to respiratory motion and patient movement, talking. IMPRESSIONS  1. Left ventricular ejection fraction, by estimation, is <20%. The left ventricle has severely decreased function. The left  ventricle demonstrates global hypokinesis with regional variation. The left ventricular internal cavity size was severely dilated. Left ventricular diastolic parameters are consistent with Grade III diastolic dysfunction (restrictive). Elevated left ventricular end-diastolic pressure. The E/e' is 71.  2. Right ventricular systolic function is mildly reduced. The right ventricular size is mildly enlarged. There is moderately elevated pulmonary artery systolic pressure. The estimated right ventricular systolic pressure is 123XX123 mmHg.  3. Left atrial size was severely dilated.  4. The mitral valve is abnormal. Mild mitral valve regurgitation.  5. Tricuspid valve regurgitation is mild to moderate.  6. The aortic valve has an indeterminant number of cusps. There is moderate calcification of the aortic valve. There is moderate thickening of the aortic valve. Aortic valve regurgitation is not visualized. Mild to moderate aortic valve stenosis. Aortic  valve area, by VTI measures 1.52 cm. Aortic valve mean gradient measures 5.0 mmHg. Aortic valve Vmax measures 1.56 m/s.  7. The inferior vena cava is dilated in size with <50% respiratory variability, suggesting right atrial pressure of 15 mmHg.  8. Right atrial size was moderately dilated. Comparison(s): Changes from prior study are noted. 06/21/2020: LVEF <20%. FINDINGS  Left  Ventricle: Left ventricular ejection fraction, by estimation, is <20%. The left ventricle has severely decreased function. The left ventricle demonstrates global hypokinesis. The left ventricular internal cavity size was severely dilated. There is no left ventricular hypertrophy. Left ventricular diastolic parameters are consistent with Grade III diastolic dysfunction (restrictive). Elevated left ventricular end-diastolic pressure. The E/e' is 16. Right Ventricle: The right ventricular size is mildly enlarged. No increase in right ventricular wall thickness. Right ventricular systolic function is mildly reduced. There is moderately elevated pulmonary artery systolic pressure. The tricuspid regurgitant velocity is 3.23 m/s, and with an assumed right atrial pressure of 15 mmHg, the estimated right ventricular systolic pressure is 123XX123 mmHg. Left Atrium: Left atrial size was severely dilated. Right Atrium: Right atrial size was moderately dilated. Pericardium: There is no evidence of pericardial effusion. Mitral Valve: The mitral valve is abnormal. There is mild thickening of the mitral valve leaflet(s). Mild mitral valve regurgitation. Tricuspid Valve: The tricuspid valve is grossly normal. Tricuspid valve regurgitation is mild to moderate. Aortic Valve: The aortic valve has an indeterminant number of cusps. There is moderate calcification of the aortic valve. There is moderate thickening of the aortic valve. Aortic valve regurgitation is not visualized. Mild to moderate aortic stenosis is present. Aortic valve mean gradient measures 5.0 mmHg. Aortic valve peak gradient measures 9.7 mmHg. Aortic valve area, by VTI measures 1.52 cm. Pulmonic Valve: The pulmonic valve was grossly normal. Pulmonic valve regurgitation is trivial. Aorta: The aortic root and ascending aorta are structurally normal, with no evidence of dilitation. Venous: The inferior vena cava is dilated in size with less than 50% respiratory variability,  suggesting right atrial pressure of 15 mmHg. IAS/Shunts: No atrial level shunt detected by color flow Doppler. Additional Comments: A device lead is visualized.  LEFT VENTRICLE PLAX 2D LVIDd:         7.30 cm      Diastology LVIDs:         6.90 cm      LV e' medial:   2.95 cm/s LV PW:         0.80 cm      LV E/e' medial: 32.2 LV IVS:        0.50 cm LVOT diam:     2.60 cm LV SV:  44 LV SV Index:   20 LVOT Area:     5.31 cm  LV Volumes (MOD) LV vol d, MOD A2C: 213.0 ml LV vol d, MOD A4C: 158.0 ml LV vol s, MOD A2C: 166.0 ml LV vol s, MOD A4C: 134.0 ml LV SV MOD A2C:     47.0 ml LV SV MOD A4C:     158.0 ml LV SV MOD BP:      34.4 ml RIGHT VENTRICLE TAPSE (M-mode): 1.7 cm LEFT ATRIUM              Index       RIGHT ATRIUM           Index LA diam:        4.40 cm  1.96 cm/m  RA Area:     26.90 cm LA Vol (A2C):   105.0 ml 46.84 ml/m RA Volume:   89.70 ml  40.01 ml/m LA Vol (A4C):   115.0 ml 51.30 ml/m LA Biplane Vol: 112.0 ml 49.96 ml/m  AORTIC VALVE AV Area (Vmax):    1.52 cm AV Area (Vmean):   1.45 cm AV Area (VTI):     1.52 cm AV Vmax:           155.50 cm/s AV Vmean:          105.350 cm/s AV VTI:            0.288 m AV Peak Grad:      9.7 mmHg AV Mean Grad:      5.0 mmHg LVOT Vmax:         44.40 cm/s LVOT Vmean:        28.800 cm/s LVOT VTI:          0.082 m LVOT/AV VTI ratio: 0.29  AORTA Ao Root diam: 3.60 cm MITRAL VALVE               TRICUSPID VALVE MV Area (PHT): 5.13 cm    TR Peak grad:   41.7 mmHg MV Decel Time: 148 msec    TR Vmax:        323.00 cm/s MR Peak grad: 45.8 mmHg MR Vmax:      338.50 cm/s  SHUNTS MV E velocity: 95.10 cm/s  Systemic VTI:  0.08 m MV A velocity: 28.70 cm/s  Systemic Diam: 2.60 cm MV E/A ratio:  3.31 Lyman Bishop MD Electronically signed by Lyman Bishop MD Signature Date/Time: 04/09/2021/3:43:08 PM    Final     Anti-infectives: Anti-infectives (From admission, onward)    Start     Dose/Rate Route Frequency Ordered Stop   04/09/21 1530  piperacillin-tazobactam (ZOSYN)  IVPB 3.375 g        3.375 g 12.5 mL/hr over 240 Minutes Intravenous  Once 04/09/21 1443 04/09/21 2117   04/09/21 0945  piperacillin-tazobactam (ZOSYN) IVPB 3.375 g        3.375 g 12.5 mL/hr over 240 Minutes Intravenous Every 8 hours 04/09/21 0853     04/09/21 0400  vancomycin (VANCOCIN) IVPB 1000 mg/200 mL premix  Status:  Discontinued        1,000 mg 200 mL/hr over 60 Minutes Intravenous Every 24 hours 04/08/21 0724 04/09/21 0853   04/08/21 1000  ceFEPIme (MAXIPIME) 2 g in sodium chloride 0.9 % 100 mL IVPB  Status:  Discontinued        2 g 200 mL/hr over 30 Minutes Intravenous Every 12 hours 04/08/21 0724 04/09/21 0853   04/08/21 0800  metroNIDAZOLE (FLAGYL) IVPB 500 mg  Status:  Discontinued        500 mg 100 mL/hr over 60 Minutes Intravenous Every 12 hours 04/08/21 0704 04/09/21 0853   04/08/21 0345  vancomycin (VANCOREADY) IVPB 1750 mg/350 mL        1,750 mg 175 mL/hr over 120 Minutes Intravenous  Once 04/08/21 0324 04/08/21 0622   04/08/21 0330  ceFEPIme (MAXIPIME) 2 g in sodium chloride 0.9 % 100 mL IVPB        2 g 200 mL/hr over 30 Minutes Intravenous  Once 04/08/21 0324 04/08/21 0416   04/08/21 0330  metroNIDAZOLE (FLAGYL) IVPB 500 mg        500 mg 100 mL/hr over 60 Minutes Intravenous  Once 04/08/21 0324 04/08/21 0541        Assessment/Plan Gastric ulcer with contained perforation  - CT with concern for above, UGI showed large gastric ulcer with contained perforation along greater curve with possible subtle leak - patient exam very benign with no abdominal ttp and no distention  - will follow very closely but no indication for emergent surgical intervention this morning  - continue PPI gtt and IV abx, continue NPO - pt was given ice chips and water this AM by someone but couldn't remember who - will discuss with surgical attending  - will need EGD in several weeks to determine whether root etiology of all this is ulcerative disease vs underlying malignancy  Septic shock  - off pressors, on dobutamine Dehydration - resolved   FEN: NPO, PPI gtt VTE: SCDs ID: [email or web address]   CKD stage III HFrEF s/p AICD - EF <20% 10/10/20 Atrial Fibrillation on Eliquis  T2DM Hx of HTN Tobacco abuse  LOS: 3 days    Norm Parcel, [facility] Surgery 04/11/2021, 10:04 AM Please see Amion for pager number during day hours 7:00am-4:30pm

## 2021-04-11 NOTE — Progress Notes (Signed)
PROGRESS NOTE    Anthony Rubio.  [id number] DOB: Oct 14, 1950 DOA: 04/08/2021 PCP: Vivi Barrack, MD    Chief Complaint  Patient presents with   Emesis    Brief Narrative:   Anthony Rubio is a 70 year old male with past medical history of HFrEF with EF <20%, atrial flutter, CAD, AICD, CKD stage III, type 2 diabetes who presented to the ED with approximately 5 days of persistent vomiting.  He reports no hematemesis, His stomach has felt "sore" but no severe abdominal pain, started developing diarrhea since arrival to the ED. He denies fevers, cough, or known ill contacts.   In the ED, he was initially hypotensive which improved marginally with IV fluid.  Labs are significant for lactic acid of 2.1, no leukocytosis, creatinine at baseline.  PCCM consulted for persistent hypotension after 2 L IV fluid.   CT abdomen/pelvis concerning for perforated bowel with potential gastric ulcer in the antrum of the stomach.     Assessment & Plan:   Active Problems:   Bowel perforation (HCC)   Sepsis with acute hypoxic respiratory failure (HCC)   Acute pulmonary edema (HCC)  Shock -Hypotensive, requires pressor supports, remains on dobutamine -Cardiac versus septic from perforated gastric ulcer -Patient remains in ICU, he can be transferred to progressive care which accepts dobutamine drip with no titration  Gastric ulcer with contained perforation bowel Septic shock -Continue with IV Zosyn. -General surgery input greatly appreciated.  Abd exam is  benign, with no abdominal tenderness or distention -High risk for surgery, continue with conservative management, keep n.p.o., continue with PPI and antibiotics -Will need EGD in several weeks to determine whether etiology is from ulcerative disease versus underlying malignancy -Patient with gram-positive rods, likely contaminant 1/4, likely contaminant -Likely will need TPN if no oral intake 7   Chronic Biventricular Heart Failure  - mixed  ischemic/nonischemic cardiomyopathy -Management per cardiology/CHF team, he remains on dobutamine drip.  Heart medications remains on hold given hypotension. -Echo on 10/5 showing EF less than 20%. - Renal function improving.     CAD:  -Chest pain, he is on aspirin and Eliquis for A. fib, both on hold given perforation. - holding statin as NPO   PAF:  -Rhythm, anticoagulation remains on hold given gastric perforation, will discussed with general surgery when it is safe to presume  Stage IV CKD  -Baseline creatinine 2.5, around 3 on admission, improving with dobutamine.  Prolonged QT Electrolyte and replete, continue with telemetry.   Hypernatremia  Sodium 149, will start D5W at 30 cc/h, especially patient is NPO.    DVT prophylaxis: SCD Code Status: Full Family Communication: None at bedside Disposition:   Status is: Inpatient  Remains inpatient appropriate because:IV treatments appropriate due to intensity of illness or inability to take PO  Dispo: The patient is from: Home              Anticipated d/c is to: Home              Patient currently is not medically stable to d/c.   Difficult to place patient No       Consultants:  PCCM CHF General surgery   Subjective:  Patient denies any complaints, no nausea, no vomiting, no abdominal pain  Objective: Vitals:   04/11/21 0500 04/11/21 0754 04/11/21 0800 04/11/21 1147  BP:   111/85   Pulse:   70   Resp:   12   Temp:  98.1 F (36.7 C)  97.8 F (36.6  C)  TempSrc:  Oral  Oral  SpO2:   91%   Weight: 92 kg     Height:        Intake/Output Summary (Last 24 hours) at 04/11/2021 1557 Last data filed at 04/11/2021 1335 Gross per 24 hour  Intake 702.54 ml  Output 875 ml  Net -172.46 ml   Filed Weights   04/08/21 1000 04/09/21 0334 04/11/21 0500  Weight: 94.7 kg 93.3 kg 92 kg    Examination:   Awake Alert, Oriented X 3, No new F.N deficits, Normal affect Symmetrical Chest wall movement, Good air  movement bilaterally, CTAB RRR,No Gallops,Rubs or new Murmurs, No Parasternal Heave +ve B.Sounds, Abd Soft, No tenderness, No rebound - guarding or rigidity. No Cyanosis, Clubbing or edema, No new Rash or bruise     Data Reviewed: I have personally reviewed following labs and imaging studies  CBC: Recent Labs  Lab 04/08/21 0313 04/08/21 0519 04/09/21 0300 04/10/21 0557 04/11/21 0520  WBC 5.3  --  12.7* 7.2 5.9  NEUTROABS 4.1  --   --  5.6 4.7  HGB 17.8* 18.4* 18.7* 16.2 16.4  HCT 57.7* 54.0* 59.6* 51.3 52.6*  MCV 97.8  --  96.8 95.5 96.9  PLT 150  --  150 88* 93*    Basic Metabolic Panel: Recent Labs  Lab 04/08/21 0313 04/08/21 0511 04/08/21 0519 04/09/21 0300 04/10/21 0557 04/11/21 0645  NA 143  --  140 141 149* 149*  K 4.3  --  4.0 4.3 3.7 3.7  CL 104  --  112* 104 112* 115*  CO2 26  --   --  '23 27 25  '$ GLUCOSE 157*  --  128* 150* 99 99  BUN 35*  --  36* 43* 39* 36*  CREATININE 2.76*  --  2.50* 2.87* 2.49* 1.98*  CALCIUM 8.8*  --   --  8.6* 8.6* 8.5*  MG  --  2.5*  --  2.6*  --   --   PHOS  --   --   --  5.7*  --   --     GFR: Estimated Creatinine Clearance: 42 mL/min (A) (by C-G formula based on SCr of 1.98 mg/dL (H)).  Liver Function Tests: Recent Labs  Lab 04/08/21 0313  AST 28  ALT 27  ALKPHOS 81  BILITOT 1.9*  PROT 6.1*  ALBUMIN 3.0*    CBG: Recent Labs  Lab 04/08/21 1529 04/09/21 1602 04/11/21 0037 04/11/21 0130 04/11/21 0422  GLUCAP 194* 117* 63* 128* 103*     Recent Results (from the past 240 hour(s))  Resp Panel by RT-PCR (Flu A&B, Covid) Nasopharyngeal Swab     Status: None   Collection Time: 04/08/21  3:00 AM   Specimen: Nasopharyngeal Swab; Nasopharyngeal(NP) swabs in vial transport medium  Result Value Ref Range Status   SARS Coronavirus 2 by RT PCR NEGATIVE NEGATIVE Final    Comment: (NOTE) SARS-CoV-2 target nucleic acids are NOT DETECTED.  The SARS-CoV-2 RNA is generally detectable in upper respiratory specimens during  the acute phase of infection. The lowest concentration of SARS-CoV-2 viral copies this assay can detect is 138 copies/mL. A negative result does not preclude SARS-Cov-2 infection and should not be used as the sole basis for treatment or other patient management decisions. A negative result may occur with  improper specimen collection/handling, submission of specimen other than nasopharyngeal swab, presence of viral mutation(s) within the areas targeted by this assay, and inadequate number of viral copies(<138 copies/mL). A negative  result must be combined with clinical observations, patient history, and epidemiological information. The expected result is Negative.  Fact Sheet for Patients:  EntrepreneurPulse.com.au  Fact Sheet for Healthcare Providers:  IncredibleEmployment.be  This test is no t yet approved or cleared by the [place] FDA and  has been authorized for detection and/or diagnosis of SARS-CoV-2 by FDA under an Emergency Use Authorization (EUA). This EUA will remain  in effect (meaning this test can be used) for the duration of the COVID-19 declaration under Section 564(b)(1) of the Act, 21 U.S.C.section 360bbb-3(b)(1), unless the authorization is terminated  or revoked sooner.       Influenza A by PCR NEGATIVE NEGATIVE Final   Influenza B by PCR NEGATIVE NEGATIVE Final    Comment: (NOTE) The Xpert Xpress SARS-CoV-2/FLU/RSV plus assay is intended as an aid in the diagnosis of influenza from Nasopharyngeal swab specimens and should not be used as a sole basis for treatment. Nasal washings and aspirates are unacceptable for Xpert Xpress SARS-CoV-2/FLU/RSV testing.  Fact Sheet for Patients: EntrepreneurPulse.com.au  Fact Sheet for Healthcare Providers: IncredibleEmployment.be  This test is not yet approved or cleared by the [place] FDA and has been authorized for detection and/or  diagnosis of SARS-CoV-2 by FDA under an Emergency Use Authorization (EUA). This EUA will remain in effect (meaning this test can be used) for the duration of the COVID-19 declaration under Section 564(b)(1) of the Act, 21 U.S.C. section 360bbb-3(b)(1), unless the authorization is terminated or revoked.  Performed at [facility], [place] [address], [place], [place] [address]   Blood Culture (routine x 2)     Status: None (Preliminary result)   Collection Time: 04/08/21  3:25 AM   Specimen: BLOOD  Result Value Ref Range Status   Specimen Description BLOOD LEFT ANTECUBITAL  Final   Special Requests   Final    BOTTLES DRAWN AEROBIC AND ANAEROBIC Blood Culture adequate volume   Culture   Final    NO GROWTH 3 DAYS Performed at [facility], [place] [address] [place], [place] [address]    Report Status PENDING  Incomplete  Blood Culture (routine x 2)     Status: None (Preliminary result)   Collection Time: 04/08/21  3:40 AM   Specimen: BLOOD RIGHT HAND  Result Value Ref Range Status   Specimen Description BLOOD RIGHT HAND  Final   Special Requests   Final    BOTTLES DRAWN AEROBIC AND ANAEROBIC Blood Culture adequate volume   Culture  Setup Time   Final    GRAM POSITIVE RODS AEROBIC BOTTLE ONLY CRITICAL RESULT CALLED TO, READ BACK BY AND VERIFIED WITH: PHARMD ALEX L [id number] [id number] FCP    Culture   Final    GRAM POSITIVE RODS CULTURE REINCUBATED FOR BETTER GROWTH Performed at [facility], [place] [address] [place], [place] [address]    Report Status PENDING  Incomplete  MRSA Next Gen by PCR, Nasal     Status: None   Collection Time: 04/08/21  8:43 AM   Specimen: Nasal Mucosa; Nasal Swab  Result Value Ref Range Status   MRSA by PCR Next Gen NOT DETECTED NOT DETECTED Final    Comment: (NOTE) The GeneXpert MRSA Assay (FDA approved for NASAL specimens only), is one component of a comprehensive MRSA colonization surveillance program. It is not intended to diagnose MRSA  infection nor to guide or monitor treatment for MRSA infections. Test performance is not FDA approved in patients less than 2 years  old. Performed at [facility], [place] [address], [place], [place] [address]          Radiology Studies: DG CHEST PORT 1 VIEW  Result Date: 04/09/2021 CLINICAL DATA:  Central line placement. EXAM: PORTABLE CHEST 1 VIEW COMPARISON:  Chest x-ray 04/08/2021. FINDINGS: Right-sided central venous catheter tip projects over the brachiocephalic SVC junction. Left-sided central venous catheter has been removed in the interval. There is no pneumothorax. Left-sided ICD is unchanged in position. The heart is enlarged. Central pulmonary vascular congestion and interstitial opacities persist. There is no definite pleural effusion, although costophrenic angles have been excluded from the exam. There is no new focal lung infiltrate. No acute fractures are seen. IMPRESSION: 1. New right-sided central venous catheter tip projects over the brachiocephalic SVC junction. 2. Left-sided central venous catheter has been removed. 3. Stable cardiomegaly with mild interstitial edema. Electronically Signed   By: Ronney Asters M.D.   On: 04/09/2021 16:24        Scheduled Meds:  chlorhexidine  15 mL Mouth Rinse BID   Chlorhexidine Gluconate Cloth  6 each Topical Daily   feeding supplement  1 Container Oral TID BM   mouth rinse  15 mL Mouth Rinse q12n4p   mupirocin ointment   Topical BID   pantoprazole  40 mg Intravenous Q12H   sodium chloride flush  10-40 mL Intracatheter Q12H   Continuous Infusions:  sodium chloride 10 mL/hr at 04/10/21 1039   DOBUTamine 1.5 mcg/kg/min (04/11/21 1453)   norepinephrine (LEVOPHED) Adult infusion Stopped (04/09/21 2211)   piperacillin-tazobactam (ZOSYN)  IV 12.5 mL/hr at 04/11/21 1213     LOS: 3 days      Phillips Climes, MD [facility]   To contact the attending provider between 7A-7P or the covering provider during after  hours 7P-7A, please log into the web site [email or web address] and access using universal [place] password for that web site. If you do not have the password, please call the hospital operator.  04/11/2021, 3:57 PM

## 2021-04-11 NOTE — Progress Notes (Addendum)
Advanced Heart Failure Rounding Note  PCP-Cardiologist: Dr. Aundra Dubin     Patient Profile    70 y/o male w/ CAD, mixed ischemic/nonischemic cardiomyopathy, atrial fibrillation on Eliqius, type 2 diabetes, recent worsening of HF due to increase in RV pacing, s/p recent upgrade to CRT-D 03/13/21, admitted w/ septic shock in setting of perforated bowel. AHF team consulted for preoperative evaluation/surgical clearance.    TEE 4/22: LVEF <20%, RV moderately reduced   Subjective:   Bld CX 10/4 -->Gram + rods. Abx changed to Zosyn. WBC down 7.2. 10/5 Central line placed. CO-OX 49% Started on dobutamine. Norepi stopped.   He has a contained perforation of a gastric ulcer from PUD versus gastric cancer.  Surgery saw 04/10/21, plan to treat with abx, PPI, and bowel rest. If ulcer does not heal, will need EGD with biopsy to make diagnosis.   Echo EF < 20%.   Remains on dobutamine 2.5 mcg. CO-OX pending.   Creatinine trending down 2.9>2.5>2   Denies pain. Denies shortness of breath.    Objective:   Weight Range: 92 kg Body mass index is 24.69 kg/m.   Vital Signs:   Temp:  [97.6 F (36.4 C)-98.1 F (36.7 C)] 98.1 F (36.7 C) (10/07 0754) Pulse Rate:  [66-81] 70 (10/07 0800) Resp:  [5-24] 12 (10/07 0800) BP: (80-117)/(64-85) 111/85 (10/07 0800) SpO2:  [85 %-99 %] 91 % (10/07 0800) Weight:  [92 kg] 92 kg (10/07 0500) Last BM Date: 04/10/21  Weight change: Filed Weights   04/08/21 1000 04/09/21 0334 04/11/21 0500  Weight: 94.7 kg 93.3 kg 92 kg    Intake/Output:   Intake/Output Summary (Last 24 hours) at 04/11/2021 1029 Last data filed at 04/11/2021 0400 Gross per 24 hour  Intake 363.83 ml  Output 676 ml  Net -312.17 ml      Physical Exam   CVP 3  General:   No resp difficulty HEENT: normal Neck: supple. no JVD. Carotids 2+ bilat; no bruits. No lymphadenopathy or thryomegaly appreciated. Cor: PMI nondisplaced. Regular rate & rhythm. No rubs, gallops or murmurs. R  subclavian CVC  Lungs: clear Abdomen: soft, nontender, nondistended. No hepatosplenomegaly. No bruits or masses. Good bowel sounds. Extremities: no cyanosis, clubbing, rash, edema Neuro: alert & orientedx3, cranial nerves grossly intact. moves all 4 extremities w/o difficulty. Affect pleasant  Telemetry   A sensed V paced 70   EKG    No new EKG to review   Labs    CBC Recent Labs    04/10/21 0557 04/11/21 0520  WBC 7.2 5.9  NEUTROABS 5.6 4.7  HGB 16.2 16.4  HCT 51.3 52.6*  MCV 95.5 96.9  PLT 88* 93*   Basic Metabolic Panel Recent Labs    04/09/21 0300 04/10/21 0557 04/11/21 0645  NA 141 149* 149*  K 4.3 3.7 3.7  CL 104 112* 115*  CO2 '23 27 25  '$ GLUCOSE 150* 99 99  BUN 43* 39* 36*  CREATININE 2.87* 2.49* 1.98*  CALCIUM 8.6* 8.6* 8.5*  MG 2.6*  --   --   PHOS 5.7*  --   --    Liver Function Tests No results for input(s): AST, ALT, ALKPHOS, BILITOT, PROT, ALBUMIN in the last 72 hours.  No results for input(s): LIPASE, AMYLASE in the last 72 hours.  Cardiac Enzymes No results for input(s): CKTOTAL, CKMB, CKMBINDEX, TROPONINI in the last 72 hours.  BNP: BNP (last 3 results) Recent Labs    09/09/20 0952 04/08/21 0325  BNP 2,121.4* 2,679.7*  ProBNP (last 3 results) No results for input(s): PROBNP in the last 8760 hours.   D-Dimer No results for input(s): DDIMER in the last 72 hours. Hemoglobin A1C No results for input(s): HGBA1C in the last 72 hours. Fasting Lipid Panel No results for input(s): CHOL, HDL, LDLCALC, TRIG, CHOLHDL, LDLDIRECT in the last 72 hours. Thyroid Function Tests No results for input(s): TSH, T4TOTAL, T3FREE, THYROIDAB in the last 72 hours.  Invalid input(s): FREET3  Other results:   Imaging    No results found.   Medications:     Scheduled Medications:  chlorhexidine  15 mL Mouth Rinse BID   Chlorhexidine Gluconate Cloth  6 each Topical Daily   feeding supplement  1 Container Oral TID BM   mouth rinse  15 mL  Mouth Rinse q12n4p   mupirocin ointment   Topical BID   pantoprazole  40 mg Intravenous Q12H   sodium chloride flush  10-40 mL Intracatheter Q12H    Infusions:  sodium chloride 10 mL/hr at 04/10/21 1039   DOBUTamine 2.5 mcg/kg/min (04/11/21 0400)   norepinephrine (LEVOPHED) Adult infusion Stopped (04/09/21 2211)   piperacillin-tazobactam (ZOSYN)  IV 12.5 mL/hr at 04/11/21 0400    PRN Medications: sodium chloride flush   Assessment/Plan   1. Septic Shock/ Gastric Ulcer? Possible mass - ? If 2/2 low output from severe biventricular failure  - GS following for potential surgery, though would be high risk  - improving w/ conservative management w/ abx and bowel rest. No indication for emergent surgical intervention at this point  - GS consider UGI to evaluate for possible contained perforation if pt willing to proceed  - 10/4 -->BCx - 1/2 sets Blood culture--> Gram + Rods. On ZOsyn  - Will need TEE given new device lead    2. Chronic Biventricular Heart Failure  - mixed ischemic/nonischemic cardiomyopathy - recent worsening HF w/ increase in RV pacing s/p recent device upgrade to CRT-D 9/8 - TEE 4/22 EF <20%, RV moderately reduced  - Overall poor functional status, chronically NYHA Class IIIb at baseline  - Echo  10/5 EF < 20%.  -CVP 3. No diuretics.  - Remains on dobutamine 2.5 mcg. Check CO-OX now. Can start to wean if CO-OX stable.   - Holding HF meds/GDMT w/ hypotension   - Renal function improving.    3. CAD:  - LHC in 2011 showed large, ectatic coronaries with slow flow and moderate to severe diffuse distal vessel disease (consistent with diabetes) without good interventional options. Treated medically.  - denies any recent ischemic like CP, though poor functional status at baseline  - No chest pain.  - Not on ASA w/ Eliquis use for PAF - hold ? blocker w/ hypotension/shock  - holding statin    3. PAF:  - Maintaining SR HR controlled    4. Pre-operative Assessment   - no recent ischemic chest pain, though poor functional status at baseline. Unable to complete > 4METs of physical activity, limited by chronic HF symptoms  - Would be high risk for perioperative complications given severe biventricular HF  - EF < 20%.  - Improving with conservative management.   5. Stage IV CKD  - SCr baseline ~2.5. Creatinine 2.9>2.5 >.2 -  also possible cardiorenal/low output   6. Prolonged QT Check EKG now.   7. Hypernatremia  Sodium 149   He has gastric ulcer from PUD versus gastric cancer.  Surgery saw 04/10/21, plan to treat with abx, PPI, and bowel res  Length of Stay:  [place], NP  04/11/2021, 10:29 AM  Advanced Heart Failure Team Pager [phone] (M-F; 7a - 5p)  Please contact [facility] for night-coverage after hours (5p -7a ) and weekends on [email or web address]  Patient seen with NP, agree with the above note.   Patient remains NPO, denies abdominal pain.  Surgery following.   Dobutamine at 2.5 with CVP 3 and co-ox 64%.    General: NAD Neck: No JVD, no thyromegaly or thyroid nodule.  Lungs: Clear to auscultation bilaterally with normal respiratory effort. CV: Nondisplaced PMI.  Heart regular S1/S2, no S3/S4, no murmur.  No peripheral edema.   Abdomen: Soft, nontender, no hepatosplenomegaly, no distention.  Skin: Intact without lesions or rashes.  Neurologic: Alert and oriented x 3.  Psych: Normal affect. Extremities: No clubbing or cyanosis.  HEENT: Normal.   Decrease dobutamine to 1.5 today, stop tomorrow if co-ox remains stable. Does not need diuretic.   He will remain NPO until ok'd to advance diet by surgical service.  No plan for surgical treatment at this point, plna for EGD in several weeks to determine if ulcer/perf is due to PUD or malignancy.   He has 1/2 cultures with gram+ rods.  Would like to avoid TEE with suspected perforated ulcer.  He does have a CRT-D device.  May need ID input.   Patient has very poor insight into his  situation.   [name] 04/11/2021 2:32 PM

## 2021-04-12 DIAGNOSIS — I2510 Atherosclerotic heart disease of native coronary artery without angina pectoris: Secondary | ICD-10-CM | POA: Diagnosis not present

## 2021-04-12 DIAGNOSIS — R570 Cardiogenic shock: Secondary | ICD-10-CM | POA: Diagnosis not present

## 2021-04-12 DIAGNOSIS — I509 Heart failure, unspecified: Secondary | ICD-10-CM

## 2021-04-12 DIAGNOSIS — I5082 Biventricular heart failure: Secondary | ICD-10-CM | POA: Diagnosis not present

## 2021-04-12 DIAGNOSIS — K252 Acute gastric ulcer with both hemorrhage and perforation: Secondary | ICD-10-CM | POA: Diagnosis not present

## 2021-04-12 DIAGNOSIS — K631 Perforation of intestine (nontraumatic): Secondary | ICD-10-CM | POA: Diagnosis not present

## 2021-04-12 LAB — COOXEMETRY PANEL
Carboxyhemoglobin: 1.6 % — ABNORMAL HIGH (ref 0.5–1.5)
Methemoglobin: 0.7 % (ref 0.0–1.5)
O2 Saturation: 62 %
Total hemoglobin: 15.5 g/dL (ref 12.0–16.0)

## 2021-04-12 LAB — BASIC METABOLIC PANEL
Anion gap: 9 (ref 5–15)
BUN: 30 mg/dL — ABNORMAL HIGH (ref 8–23)
CO2: 24 mmol/L (ref 22–32)
Calcium: 7.9 mg/dL — ABNORMAL LOW (ref 8.9–10.3)
Chloride: 109 mmol/L (ref 98–111)
Creatinine, Ser: 1.7 mg/dL — ABNORMAL HIGH (ref 0.61–1.24)
GFR, Estimated: 43 mL/min — ABNORMAL LOW (ref >60)
Glucose, Bld: 189 mg/dL — ABNORMAL HIGH (ref 70–99)
Potassium: 3.3 mmol/L — ABNORMAL LOW (ref 3.5–5.1)
Sodium: 142 mmol/L (ref 135–145)

## 2021-04-12 LAB — CBC WITH DIFFERENTIAL/PLATELET
Abs Immature Granulocytes: 0.01 10*3/uL (ref 0.00–0.07)
Basophils Absolute: 0 10*3/uL (ref 0.0–0.1)
Basophils Relative: 0 %
Eosinophils Absolute: 0 10*3/uL (ref 0.0–0.5)
Eosinophils Relative: 1 %
HCT: 49.2 % (ref 39.0–52.0)
Hemoglobin: 15.3 g/dL (ref 13.0–17.0)
Immature Granulocytes: 0 %
Lymphocytes Relative: 20 %
Lymphs Abs: 0.9 10*3/uL (ref 0.7–4.0)
MCH: 29.9 pg (ref 26.0–34.0)
MCHC: 31.1 g/dL (ref 30.0–36.0)
MCV: 96.3 fL (ref 80.0–100.0)
Monocytes Absolute: 0.4 10*3/uL (ref 0.1–1.0)
Monocytes Relative: 10 %
Neutro Abs: 3.2 10*3/uL (ref 1.7–7.7)
Neutrophils Relative %: 69 %
Platelets: 101 10*3/uL — ABNORMAL LOW (ref 150–400)
RBC: 5.11 MIL/uL (ref 4.22–5.81)
RDW: 15.9 % — ABNORMAL HIGH (ref 11.5–15.5)
WBC: 4.6 10*3/uL (ref 4.0–10.5)
nRBC: 0 % (ref 0.0–0.2)

## 2021-04-12 LAB — CULTURE, BLOOD (ROUTINE X 2): Special Requests: ADEQUATE

## 2021-04-12 MED ORDER — POTASSIUM CHLORIDE CRYS ER 20 MEQ PO TBCR
40.0000 meq | EXTENDED_RELEASE_TABLET | Freq: Four times a day (QID) | ORAL | Status: AC
Start: 2021-04-12 — End: 2021-04-12
  Administered 2021-04-12 (×2): 40 meq via ORAL
  Filled 2021-04-12 (×2): qty 2

## 2021-04-12 MED ORDER — LACTATED RINGERS IV BOLUS
500.0000 mL | Freq: Once | INTRAVENOUS | Status: AC
  Administered 2021-04-12: 500 mL via INTRAVENOUS

## 2021-04-12 MED ORDER — ENSURE ENLIVE PO LIQD
237.0000 mL | Freq: Three times a day (TID) | ORAL | Status: DC
  Administered 2021-04-12 – 2021-04-17 (×14): 237 mL via ORAL

## 2021-04-12 NOTE — Progress Notes (Signed)
PROGRESS NOTE    Anthony Rubio.  [id number] DOB: 1950-07-07 DOA: 04/08/2021 PCP: Vivi Barrack, MD    Chief Complaint  Patient presents with   Emesis    Brief Narrative:   Anthony Rubio is a 70 year old male with past medical history of HFrEF with EF <20%, atrial flutter, CAD, AICD, CKD stage III, type 2 diabetes who presented to the ED with approximately 5 days of persistent vomiting.  He reports no hematemesis, His stomach has felt "sore" but no severe abdominal pain, started developing diarrhea since arrival to the ED. He denies fevers, cough, or known ill contacts.   In the ED, he was initially hypotensive which improved marginally with IV fluid.  Labs are significant for lactic acid of 2.1, no leukocytosis, creatinine at baseline.  PCCM consulted for persistent hypotension after 2 L IV fluid.   CT abdomen/pelvis concerning for perforated bowel with potential gastric ulcer in the antrum of the stomach.     Assessment & Plan:   Active Problems:   Bowel perforation (HCC)   Sepsis with acute hypoxic respiratory failure (HCC)   Acute pulmonary edema (HCC)  Shock -Hypotensive, requires pressor supports, remains on dobutamine -Cardiac versus septic from perforated gastric ulcer -Patient remains in ICU, he can be transferred to progressive care which accepts dobutamine drip with no titration  Gastric ulcer with contained perforation bowel Septic shock -Continue with IV Zosyn. -General surgery input greatly appreciated.  Abd exam is  benign, with no abdominal tenderness or distention -High risk for surgery, continue with conservative management, keep n.p.o., continue with PPI and antibiotics -Will need EGD in several weeks to determine whether etiology is from ulcerative disease versus underlying malignancy -Tolerating clear liquid diet.   Chronic Biventricular Heart Failure  - mixed ischemic/nonischemic cardiomyopathy -Management per cardiology/CHF team, he remains on  dobutamine drip.  Heart medications remains on hold given hypotension. -Echo on 10/5 showing EF less than 20%. - Renal function improving.   -Patient with gram-positive rods, likely contaminant 1/4, concern as patient had recent device upgrade to CRT-D 9/8, I have discussed with ID, will await till final and education of bacteria is available before further recommendation, but for now continue with Zosyn. -He remains on fixed dose dobutamine drip for cardiogenic shock, its been tapered by CHF team.   CAD:  -Chest pain, he is on aspirin and Eliquis for A. fib, both on hold given perforation. - holding statin as NPO   PAF:  -sinus Rhythm, anticoagulation remains on hold given gastric perforation.  Stage IV CKD  -Baseline creatinine 2.5, around 3 on admission, improving with dobutamine.  Prolonged QT Monitor Electrolyte and replete, continue with telemetry.   Hypernatremia  Resolved with D5W    DVT prophylaxis: SCD Code Status: Full Family Communication: None at bedside Disposition:   Status is: Inpatient  Remains inpatient appropriate because:IV treatments appropriate due to intensity of illness or inability to take PO  Dispo: The patient is from: Home              Anticipated d/c is to: Home              Patient currently is not medically stable to d/c.   Difficult to place patient No       Consultants:  PCCM CHF General surgery   Subjective:   no chest pain, no shortness of breath, no abdominal pain, nausea or vomiting, he reports bowel movement overnight. Objective: Vitals:   04/12/21 0700 04/12/21 0753 04/12/21  0800 04/12/21 0900  BP: 102/79  105/72 111/78  Pulse: 70  69 69  Resp: 13  13 (!) 28  Temp:  (!) 97.5 F (36.4 C)    TempSrc:  Axillary    SpO2: 93%  92% (!) 89%  Weight:      Height:        Intake/Output Summary (Last 24 hours) at 04/12/2021 1202 Last data filed at 04/12/2021 0900 Gross per 24 hour  Intake 423.67 ml  Output 975 ml  Net  -551.33 ml   Filed Weights   04/08/21 1000 04/09/21 0334 04/11/21 0500  Weight: 94.7 kg 93.3 kg 92 kg    Examination:   Awake Alert, Oriented X 3, No new F.N deficits, Normal affect Symmetrical Chest wall movement, Good air movement bilaterally, CTAB RRR,No Gallops,Rubs or new Murmurs, No Parasternal Heave +ve B.Sounds, Abd Soft, No tenderness, No rebound - guarding or rigidity. No Cyanosis, Clubbing or edema, No new Rash or bruise     Data Reviewed: I have personally reviewed following labs and imaging studies  CBC: Recent Labs  Lab 04/08/21 0313 04/08/21 0519 04/09/21 0300 04/10/21 0557 04/11/21 0520 04/12/21 0500  WBC 5.3  --  12.7* 7.2 5.9 4.6  NEUTROABS 4.1  --   --  5.6 4.7 3.2  HGB 17.8* 18.4* 18.7* 16.2 16.4 15.3  HCT 57.7* 54.0* 59.6* 51.3 52.6* 49.2  MCV 97.8  --  96.8 95.5 96.9 96.3  PLT 150  --  150 88* 93* 101*    Basic Metabolic Panel: Recent Labs  Lab 04/08/21 0313 04/08/21 0511 04/08/21 0519 04/09/21 0300 04/10/21 0557 04/11/21 0645 04/12/21 0500  NA 143  --  140 141 149* 149* 142  K 4.3  --  4.0 4.3 3.7 3.7 3.3*  CL 104  --  112* 104 112* 115* 109  CO2 26  --   --  '23 27 25 24  '$ GLUCOSE 157*  --  128* 150* 99 99 189*  BUN 35*  --  36* 43* 39* 36* 30*  CREATININE 2.76*  --  2.50* 2.87* 2.49* 1.98* 1.70*  CALCIUM 8.8*  --   --  8.6* 8.6* 8.5* 7.9*  MG  --  2.5*  --  2.6*  --   --   --   PHOS  --   --   --  5.7*  --   --   --     GFR: Estimated Creatinine Clearance: 48.9 mL/min (A) (by C-G formula based on SCr of 1.7 mg/dL (H)).  Liver Function Tests: Recent Labs  Lab 04/08/21 0313  AST 28  ALT 27  ALKPHOS 81  BILITOT 1.9*  PROT 6.1*  ALBUMIN 3.0*    CBG: Recent Labs  Lab 04/08/21 1529 04/09/21 1602 04/11/21 0037 04/11/21 0130 04/11/21 0422  GLUCAP 194* 117* 63* 128* 103*     Recent Results (from the past 240 hour(s))  Resp Panel by RT-PCR (Flu A&B, Covid) Nasopharyngeal Swab     Status: None   Collection Time:  04/08/21  3:00 AM   Specimen: Nasopharyngeal Swab; Nasopharyngeal(NP) swabs in vial transport medium  Result Value Ref Range Status   SARS Coronavirus 2 by RT PCR NEGATIVE NEGATIVE Final    Comment: (NOTE) SARS-CoV-2 target nucleic acids are NOT DETECTED.  The SARS-CoV-2 RNA is generally detectable in upper respiratory specimens during the acute phase of infection. The lowest concentration of SARS-CoV-2 viral copies this assay can detect is 138 copies/mL. A negative result does not preclude  SARS-Cov-2 infection and should not be used as the sole basis for treatment or other patient management decisions. A negative result may occur with  improper specimen collection/handling, submission of specimen other than nasopharyngeal swab, presence of viral mutation(s) within the areas targeted by this assay, and inadequate number of viral copies(<138 copies/mL). A negative result must be combined with clinical observations, patient history, and epidemiological information. The expected result is Negative.  Fact Sheet for Patients:  EntrepreneurPulse.com.au  Fact Sheet for Healthcare Providers:  IncredibleEmployment.be  This test is no t yet approved or cleared by the [place] FDA and  has been authorized for detection and/or diagnosis of SARS-CoV-2 by FDA under an Emergency Use Authorization (EUA). This EUA will remain  in effect (meaning this test can be used) for the duration of the COVID-19 declaration under Section 564(b)(1) of the Act, 21 U.S.C.section 360bbb-3(b)(1), unless the authorization is terminated  or revoked sooner.       Influenza A by PCR NEGATIVE NEGATIVE Final   Influenza B by PCR NEGATIVE NEGATIVE Final    Comment: (NOTE) The Xpert Xpress SARS-CoV-2/FLU/RSV plus assay is intended as an aid in the diagnosis of influenza from Nasopharyngeal swab specimens and should not be used as a sole basis for treatment. Nasal washings  and aspirates are unacceptable for Xpert Xpress SARS-CoV-2/FLU/RSV testing.  Fact Sheet for Patients: EntrepreneurPulse.com.au  Fact Sheet for Healthcare Providers: IncredibleEmployment.be  This test is not yet approved or cleared by the [place] FDA and has been authorized for detection and/or diagnosis of SARS-CoV-2 by FDA under an Emergency Use Authorization (EUA). This EUA will remain in effect (meaning this test can be used) for the duration of the COVID-19 declaration under Section 564(b)(1) of the Act, 21 U.S.C. section 360bbb-3(b)(1), unless the authorization is terminated or revoked.  Performed at [facility], [place] [address] [place], [place] [address]   Blood Culture (routine x 2)     Status: None (Preliminary result)   Collection Time: 04/08/21  3:25 AM   Specimen: BLOOD  Result Value Ref Range Status   Specimen Description BLOOD LEFT ANTECUBITAL  Final   Special Requests   Final    BOTTLES DRAWN AEROBIC AND ANAEROBIC Blood Culture adequate volume   Culture   Final    NO GROWTH 3 DAYS Performed at [facility], [place] [address], [place], [place] [address]    Report Status PENDING  Incomplete  Blood Culture (routine x 2)     Status: None (Preliminary result)   Collection Time: 04/08/21  3:40 AM   Specimen: BLOOD RIGHT HAND  Result Value Ref Range Status   Specimen Description BLOOD RIGHT HAND  Final   Special Requests   Final    BOTTLES DRAWN AEROBIC AND ANAEROBIC Blood Culture adequate volume   Culture  Setup Time   Final    GRAM POSITIVE RODS AEROBIC BOTTLE ONLY CRITICAL RESULT CALLED TO, READ BACK BY AND VERIFIED WITH: PHARMD ALEX L [id number] [id number] FCP    Culture   Final    GRAM POSITIVE RODS CULTURE REINCUBATED FOR BETTER GROWTH Performed at [facility], [place] [address], [place],  [address]    Report Status PENDING  Incomplete  MRSA Next Gen by PCR, Nasal     Status: None   Collection Time:  04/08/21  8:43 AM   Specimen: Nasal Mucosa; Nasal Swab  Result Value Ref Range Status   MRSA by PCR Next Gen NOT DETECTED NOT DETECTED  Final    Comment: (NOTE) The GeneXpert MRSA Assay (FDA approved for NASAL specimens only), is one component of a comprehensive MRSA colonization surveillance program. It is not intended to diagnose MRSA infection nor to guide or monitor treatment for MRSA infections. Test performance is not FDA approved in patients less than 87 years old. Performed at [facility], [place] [address] [place], [place] [address]          Radiology Studies: No results found.      Scheduled Meds:  chlorhexidine  15 mL Mouth Rinse BID   Chlorhexidine Gluconate Cloth  6 each Topical Daily   feeding supplement  237 mL Oral TID BM   mouth rinse  15 mL Mouth Rinse q12n4p   mupirocin ointment   Topical BID   pantoprazole  40 mg Intravenous Q12H   potassium chloride  40 mEq Oral Q6H   sodium chloride flush  10-40 mL Intracatheter Q12H   Continuous Infusions:  sodium chloride 10 mL/hr at 04/10/21 1039   dextrose 5 % with KCl 20 mEq / L 30 mL/hr at 04/12/21 0400   DOBUTamine 1.5 mcg/kg/min (04/12/21 0400)   norepinephrine (LEVOPHED) Adult infusion Stopped (04/09/21 2211)   piperacillin-tazobactam (ZOSYN)  IV 3.375 g (04/12/21 0937)     LOS: 4 days      Phillips Climes, MD [facility]   To contact the attending provider between 7A-7P or the covering provider during after hours 7P-7A, please log into the web site [email or web address] and access using universal [place] password for that web site. If you do not have the password, please call the hospital operator.  04/12/2021, 12:02 PM

## 2021-04-12 NOTE — Progress Notes (Signed)
Progress Note  4 Days Post-Op  Subjective: Patient denies abdominal pain or nausea. Having bowel function, thinks stools may be dark.   Objective: Vital signs in last 24 hours: Temp:  [97.5 F (36.4 C)-98.5 F (36.9 C)] 97.5 F (36.4 C) (10/08 0753) Pulse Rate:  [67-89] 69 (10/08 0900) Resp:  [8-31] 28 (10/08 0900) BP: (90-114)/(48-99) 111/78 (10/08 0900) SpO2:  [85 %-99 %] 89 % (10/08 0900) Last BM Date: 04/12/21  Intake/Output from previous day: 10/07 0701 - 10/08 0700 In: 937.4 [P.O.:476; I.V.:358.7; IV Piggyback:102.7] Out: 775 [Urine:775] Intake/Output this shift: Total I/O In: -  Out: 200 [Urine:200]  PE: General: WD, elderly male who is laying in bed in NAD Heart: regular, rate, and rhythm.  Lungs:  Respiratory effort nonlabored Abd: soft, NT, ND, +BS Psych: A&Ox3 with an irritable affect.    Lab Results:  Recent Labs    04/11/21 0520 04/12/21 0500  WBC 5.9 4.6  HGB 16.4 15.3  HCT 52.6* 49.2  PLT 93* 101*   BMET Recent Labs    04/11/21 0645 04/12/21 0500  NA 149* 142  K 3.7 3.3*  CL 115* 109  CO2 25 24  GLUCOSE 99 189*  BUN 36* 30*  CREATININE 1.98* 1.70*  CALCIUM 8.5* 7.9*   PT/INR No results for input(s): LABPROT, INR in the last 72 hours. CMP     Component Value Date/Time   NA 142 04/12/2021 0500   NA 140 03/07/2021 0833   K 3.3 (L) 04/12/2021 0500   CL 109 04/12/2021 0500   CO2 24 04/12/2021 0500   GLUCOSE 189 (H) 04/12/2021 0500   BUN 30 (H) 04/12/2021 0500   BUN 40 (H) 03/07/2021 0833   CREATININE 1.70 (H) 04/12/2021 0500   CREATININE 1.67 (H) 08/29/2015 0751   CALCIUM 7.9 (L) 04/12/2021 0500   PROT 6.1 (L) 04/08/2021 0313   ALBUMIN 3.0 (L) 04/08/2021 0313   AST 28 04/08/2021 0313   ALT 27 04/08/2021 0313   ALKPHOS 81 04/08/2021 0313   BILITOT 1.9 (H) 04/08/2021 0313   GFRNONAA 43 (L) 04/12/2021 0500   GFRAA 39 (L) 05/15/2019 0911   Lipase     Component Value Date/Time   LIPASE 25 04/08/2021 0313        Studies/Results: No results found.  Anti-infectives: Anti-infectives (From admission, onward)    Start     Dose/Rate Route Frequency Ordered Stop   04/09/21 1530  piperacillin-tazobactam (ZOSYN) IVPB 3.375 g        3.375 g 12.5 mL/hr over 240 Minutes Intravenous  Once 04/09/21 1443 04/09/21 2117   04/09/21 0945  piperacillin-tazobactam (ZOSYN) IVPB 3.375 g        3.375 g 12.5 mL/hr over 240 Minutes Intravenous Every 8 hours 04/09/21 0853     04/09/21 0400  vancomycin (VANCOCIN) IVPB 1000 mg/200 mL premix  Status:  Discontinued        1,000 mg 200 mL/hr over 60 Minutes Intravenous Every 24 hours 04/08/21 0724 04/09/21 0853   04/08/21 1000  ceFEPIme (MAXIPIME) 2 g in sodium chloride 0.9 % 100 mL IVPB  Status:  Discontinued        2 g 200 mL/hr over 30 Minutes Intravenous Every 12 hours 04/08/21 0724 04/09/21 0853   04/08/21 0800  metroNIDAZOLE (FLAGYL) IVPB 500 mg  Status:  Discontinued        500 mg 100 mL/hr over 60 Minutes Intravenous Every 12 hours 04/08/21 0704 04/09/21 0853   04/08/21 0345  vancomycin (VANCOREADY) IVPB  1750 mg/350 mL        1,750 mg 175 mL/hr over 120 Minutes Intravenous  Once 04/08/21 0324 04/08/21 0622   04/08/21 0330  ceFEPIme (MAXIPIME) 2 g in sodium chloride 0.9 % 100 mL IVPB        2 g 200 mL/hr over 30 Minutes Intravenous  Once 04/08/21 0324 04/08/21 0416   04/08/21 0330  metroNIDAZOLE (FLAGYL) IVPB 500 mg        500 mg 100 mL/hr over 60 Minutes Intravenous  Once 04/08/21 0324 04/08/21 0541        Assessment/Plan Gastric ulcer with contained perforation  - CT with concern for above, UGI showed large gastric ulcer with contained perforation along greater curve with possible subtle leak - will follow very closely but no indication for emergent surgical intervention this morning  - continue PPI and IV abx - hgb 15.3 - pt tolerating CLD and abdominal exam remains benign - advance to FLD and add ensure - will discuss with MD if patient  could resume anticoagulation tomorrow and possibly transition to PO PPI and abx tomorrow  - will discuss with MD if patient needs any follow up imaging prior to discharge - will need EGD in several weeks to determine whether root etiology of all this is ulcerative disease vs underlying malignancy    FEN: FLD, ensure, PPI BID VTE: SCDs ID: [email or web address]   CKD stage III HFrEF s/p AICD - EF <20% 10/10/20 Atrial Fibrillation on Eliquis  T2DM Hx of HTN Tobacco abuse  LOS: 4 days    Norm Parcel, [facility] 04/12/2021, 10:40 AM Please see Amion for pager number during day hours 7:00am-4:30pm

## 2021-04-12 NOTE — Progress Notes (Signed)
Advanced Heart Failure Rounding Note  PCP-Cardiologist: Dr. Aundra Dubin     Patient Profile    70 y/o male w/ CAD, mixed ischemic/nonischemic cardiomyopathy, atrial fibrillation on Eliqius, type 2 diabetes, recent worsening of HF due to increase in RV pacing, s/p recent upgrade to CRT-D 03/13/21, admitted w/ septic shock in setting of perforated bowel. AHF team consulted for preoperative evaluation/surgical clearance.    TEE 4/22: LVEF <20%, RV moderately reduced   Subjective:   Bld CX 10/4 -->Gram + rods. Abx changed to Zosyn. WBC down 7.2. 10/5 Central line placed. CO-OX 49% Started on dobutamine. Norepi stopped.   He has a contained perforation of a gastric ulcer from PUD versus gastric cancer.  Surgery saw 04/10/21, plan to treat with abx, PPI, and bowel rest. If ulcer does not heal, will need EGD with biopsy to make diagnosis.   Echo EF < 20%.   DBA turned down to 1.5. Co-ox 62%  Hgb stable 15.3  Creatinine trending down 2.9>2.5>>>1.7  Says he feels fine. No CP, SOB or ab pain. Wants to go home.   Objective:   Weight Range: 92 kg Body mass index is 24.69 kg/m.   Vital Signs:   Temp:  [97.4 F (36.3 C)-98.5 F (36.9 C)] 97.4 F (36.3 C) (10/08 1100) Pulse Rate:  [67-74] 71 (10/08 1200) Resp:  [8-31] 20 (10/08 1200) BP: (90-111)/(48-79) 111/78 (10/08 0900) SpO2:  [89 %-99 %] 94 % (10/08 1200) Last BM Date: 04/12/21  Weight change: Filed Weights   04/08/21 1000 04/09/21 0334 04/11/21 0500  Weight: 94.7 kg 93.3 kg 92 kg    Intake/Output:   Intake/Output Summary (Last 24 hours) at 04/12/2021 1352 Last data filed at 04/12/2021 1201 Gross per 24 hour  Intake 755.32 ml  Output 775 ml  Net -19.68 ml       Physical Exam   General:  Well appearing. No resp difficulty HEENT: normal Neck: supple. no JVD. Carotids 2+ bilat; no bruits. No lymphadenopathy or thryomegaly appreciated. Cor: PMI nondisplaced. Regular rate & rhythm. No rubs, gallops or murmurs. Lungs:  clear Abdomen: soft, nontender, nondistended. No hepatosplenomegaly. No bruits or masses. Good bowel sounds. Extremities: no cyanosis, clubbing, rash, edema Neuro: alert & orientedx3, cranial nerves grossly intact. moves all 4 extremities w/o difficulty. Affect pleasant  Telemetry   A sensed V paced 70s Personally reviewed   Labs    CBC Recent Labs    04/11/21 0520 04/12/21 0500  WBC 5.9 4.6  NEUTROABS 4.7 3.2  HGB 16.4 15.3  HCT 52.6* 49.2  MCV 96.9 96.3  PLT 93* 101*    Basic Metabolic Panel Recent Labs    04/11/21 0645 04/12/21 0500  NA 149* 142  K 3.7 3.3*  CL 115* 109  CO2 25 24  GLUCOSE 99 189*  BUN 36* 30*  CREATININE 1.98* 1.70*  CALCIUM 8.5* 7.9*    Liver Function Tests No results for input(s): AST, ALT, ALKPHOS, BILITOT, PROT, ALBUMIN in the last 72 hours.  No results for input(s): LIPASE, AMYLASE in the last 72 hours.  Cardiac Enzymes No results for input(s): CKTOTAL, CKMB, CKMBINDEX, TROPONINI in the last 72 hours.  BNP: BNP (last 3 results) Recent Labs    09/09/20 0952 04/08/21 0325  BNP 2,121.4* 2,679.7*     ProBNP (last 3 results) No results for input(s): PROBNP in the last 8760 hours.   D-Dimer No results for input(s): DDIMER in the last 72 hours. Hemoglobin A1C No results for input(s): HGBA1C in the  last 72 hours. Fasting Lipid Panel No results for input(s): CHOL, HDL, LDLCALC, TRIG, CHOLHDL, LDLDIRECT in the last 72 hours. Thyroid Function Tests No results for input(s): TSH, T4TOTAL, T3FREE, THYROIDAB in the last 72 hours.  Invalid input(s): FREET3  Other results:   Imaging    No results found.   Medications:     Scheduled Medications:  chlorhexidine  15 mL Mouth Rinse BID   Chlorhexidine Gluconate Cloth  6 each Topical Daily   feeding supplement  237 mL Oral TID BM   mouth rinse  15 mL Mouth Rinse q12n4p   mupirocin ointment   Topical BID   pantoprazole  40 mg Intravenous Q12H   potassium chloride  40 mEq  Oral Q6H   sodium chloride flush  10-40 mL Intracatheter Q12H    Infusions:  sodium chloride 10 mL/hr at 04/10/21 1039   DOBUTamine 1.5 mcg/kg/min (04/12/21 1204)   piperacillin-tazobactam (ZOSYN)  IV 12.5 mL/hr at 04/12/21 1204    PRN Medications: sodium chloride flush   Assessment/Plan   1. Septic Shock due to contained abdominal perforation  - UGI and CT show contained gastric perf with possible mass. GSU managing - improving w/ conservative management w/ abx and bowel rest. No indication for emergent surgical intervention at this point  2. Positive blood cultures  - 10/4 -->BCx - 1/2 sets Blood culture--> Gram + Rods. On Zosyn  (suspect probable contaminant) - With stomach perforation would avoid TEE   3. Chronic Biventricular Heart Failure  - mixed ischemic/nonischemic cardiomyopathy - recent worsening HF w/ increase in RV pacing s/p recent device upgrade to CRT-D 9/8 - TEE 4/22 EF <20%, RV moderately reduced  - Overall poor functional status, chronically NYHA Class IIIb at baseline  - Echo  10/5 EF < 20%.  - CVP remains low. - Remains on dobutamine 1.5 mcg. Co-ox 62%. Stop DBA - Holding HF meds/GDMT w/ hypotension   - Renal function continues to improve  4. CAD:  - LHC in 2011 showed large, ectatic coronaries with slow flow and moderate to severe diffuse distal vessel disease (consistent with diabetes) without good interventional options. Treated medically.  - No s/s ischemia - Not on ASA w/ Eliquis use for PAF - hold ? blocker w/ hypotension/shock  - holding statin    5. PAF:  - Maintaining SR HR controlled    6. Pre-operative Assessment  - no recent ischemic chest pain, though poor functional status at baseline. Unable to complete > 4METs of physical activity, limited by chronic HF symptoms  - Would be high risk for perioperative complications given severe biventricular HF  - EF < 20%.  - Improving with conservative management.   7. Aki on Stage IV CKD  -  SCr baseline ~2.5. Creatinine 2.9>2.5 >>>1.7 -  also possible cardiorenal/low output   8. Hypernatremia  - resolved. Sodium 149 -> 142   Length of Stay: 4  Glori Bickers, MD  04/12/2021, 1:52 PM  Advanced Heart Failure Team Pager [phone] (M-F; 7a - 5p)  Please contact [facility] for night-coverage after hours (5p -7a ) and weekends on [email or web address]

## 2021-04-12 NOTE — Progress Notes (Signed)
Notified by RN that BP has been 79-85/50-64 recently. Pt is asymptomatic.  Dobutamine infusion was stopped earlier this afternoon.  Has HFrEF but no signs of volume overload at this time. CVP 8 per RN at this time Give 500 LR bolus at 250/hr x 2 hrs.  If BP remains low may need to go back on pressor support.

## 2021-04-13 DIAGNOSIS — I509 Heart failure, unspecified: Secondary | ICD-10-CM | POA: Diagnosis not present

## 2021-04-13 DIAGNOSIS — K252 Acute gastric ulcer with both hemorrhage and perforation: Secondary | ICD-10-CM | POA: Diagnosis not present

## 2021-04-13 DIAGNOSIS — R6520 Severe sepsis without septic shock: Secondary | ICD-10-CM | POA: Diagnosis not present

## 2021-04-13 DIAGNOSIS — J9601 Acute respiratory failure with hypoxia: Secondary | ICD-10-CM | POA: Diagnosis not present

## 2021-04-13 DIAGNOSIS — I2510 Atherosclerotic heart disease of native coronary artery without angina pectoris: Secondary | ICD-10-CM | POA: Diagnosis not present

## 2021-04-13 DIAGNOSIS — A419 Sepsis, unspecified organism: Secondary | ICD-10-CM | POA: Diagnosis not present

## 2021-04-13 DIAGNOSIS — I5082 Biventricular heart failure: Secondary | ICD-10-CM | POA: Diagnosis not present

## 2021-04-13 LAB — CULTURE, BLOOD (ROUTINE X 2)
Culture: NO GROWTH
Special Requests: ADEQUATE

## 2021-04-13 LAB — BASIC METABOLIC PANEL
Anion gap: 7 (ref 5–15)
BUN: 26 mg/dL — ABNORMAL HIGH (ref 8–23)
CO2: 23 mmol/L (ref 22–32)
Calcium: 8 mg/dL — ABNORMAL LOW (ref 8.9–10.3)
Chloride: 110 mmol/L (ref 98–111)
Creatinine, Ser: 1.61 mg/dL — ABNORMAL HIGH (ref 0.61–1.24)
GFR, Estimated: 45 mL/min — ABNORMAL LOW (ref >60)
Glucose, Bld: 150 mg/dL — ABNORMAL HIGH (ref 70–99)
Potassium: 4.3 mmol/L (ref 3.5–5.1)
Sodium: 140 mmol/L (ref 135–145)

## 2021-04-13 LAB — COOXEMETRY PANEL
Carboxyhemoglobin: 1.3 % (ref 0.5–1.5)
Methemoglobin: 0.7 % (ref 0.0–1.5)
O2 Saturation: 54 %
Total hemoglobin: 15.3 g/dL (ref 12.0–16.0)

## 2021-04-13 LAB — CBC WITH DIFFERENTIAL/PLATELET
Abs Immature Granulocytes: 0.02 10*3/uL (ref 0.00–0.07)
Basophils Absolute: 0 10*3/uL (ref 0.0–0.1)
Basophils Relative: 1 %
Eosinophils Absolute: 0 10*3/uL (ref 0.0–0.5)
Eosinophils Relative: 1 %
HCT: 47.6 % (ref 39.0–52.0)
Hemoglobin: 14.9 g/dL (ref 13.0–17.0)
Immature Granulocytes: 0 %
Lymphocytes Relative: 22 %
Lymphs Abs: 1.1 10*3/uL (ref 0.7–4.0)
MCH: 30 pg (ref 26.0–34.0)
MCHC: 31.3 g/dL (ref 30.0–36.0)
MCV: 96 fL (ref 80.0–100.0)
Monocytes Absolute: 0.5 10*3/uL (ref 0.1–1.0)
Monocytes Relative: 11 %
Neutro Abs: 3.1 10*3/uL (ref 1.7–7.7)
Neutrophils Relative %: 65 %
Platelets: 101 10*3/uL — ABNORMAL LOW (ref 150–400)
RBC: 4.96 MIL/uL (ref 4.22–5.81)
RDW: 15.8 % — ABNORMAL HIGH (ref 11.5–15.5)
WBC: 4.7 10*3/uL (ref 4.0–10.5)
nRBC: 0 % (ref 0.0–0.2)

## 2021-04-13 MED ORDER — LACTATED RINGERS IV BOLUS
250.0000 mL | Freq: Once | INTRAVENOUS | Status: AC
  Administered 2021-04-13: 250 mL via INTRAVENOUS

## 2021-04-13 MED ORDER — AMOXICILLIN-POT CLAVULANATE 875-125 MG PO TABS
1.0000 | ORAL_TABLET | Freq: Two times a day (BID) | ORAL | Status: DC
  Administered 2021-04-13 – 2021-04-17 (×9): 1 via ORAL
  Filled 2021-04-13 (×10): qty 1

## 2021-04-13 MED ORDER — PANTOPRAZOLE SODIUM 40 MG PO TBEC
40.0000 mg | DELAYED_RELEASE_TABLET | Freq: Two times a day (BID) | ORAL | Status: DC
  Administered 2021-04-13 – 2021-04-17 (×9): 40 mg via ORAL
  Filled 2021-04-13 (×9): qty 1

## 2021-04-13 MED ORDER — APIXABAN 5 MG PO TABS
5.0000 mg | ORAL_TABLET | Freq: Two times a day (BID) | ORAL | Status: DC
  Administered 2021-04-13 – 2021-04-17 (×9): 5 mg via ORAL
  Filled 2021-04-13 (×9): qty 1

## 2021-04-13 NOTE — Evaluation (Signed)
Physical Therapy Evaluation Patient Details Name: Anthony Rubio. MRN: [id number] DOB: 08-Jun-1951 Today's Date: 04/13/2021  History of Present Illness  The pt is a 70 yo male presenting 10/4 with x5 days vomiting, and hypotension. CT abdomen revealed perforated bowel. PMH includes: HFrEF (EF < 20%), atrial flutter, CAD, ACID, CKD III, and DM II.   Clinical Impression  Pt in bed upon arrival of PT, agreeable to evaluation at this time. Prior to admission the pt was mobilizing with use of 4-wheel walker in the home, and reports independence with ADLs. The pt now presents with limitations in functional mobility, strength, power, dynamic stability, and endurance due to above dx, and will continue to benefit from skilled PT to address these deficits. The pt was able to demo good bed mobility without assist, and BP remained stable with all changes in position. He does require increased assist at this time to power up into standing position, to steady in standing, and is heavily reliant on BUE support to manage short bout of ambulation. The pt required 2L O2 to maintain SpO2 > 90% with activity, and will continue to benefit from skilled PT acutely to progress both activity tolerance as well as independence and stability with mobility to facilitate return home. Will likely continue to require skilled PT following d/c to facilitate return to prior level of independence and mobility at home.         Recommendations for follow up therapy are one component of a multi-disciplinary discharge planning process, led by the attending physician.  Recommendations may be updated based on patient status, additional functional criteria and insurance authorization.  Follow Up Recommendations Home health PT;Supervision for mobility/OOB    Equipment Recommendations  3in1 (PT)    Recommendations for Other Services       Precautions / Restrictions Precautions Precautions: Fall Precaution Comments: watch  BP Restrictions Weight Bearing Restrictions: No      Mobility  Bed Mobility Overal bed mobility: Modified Independent             General bed mobility comments: no assist, HOB elevated    Transfers Overall transfer level: Needs assistance Equipment used: Rolling walker (2 wheeled) Transfers: Sit to/from Stand Sit to Stand: Mod assist;Min assist         General transfer comment: modA initally, minA from elevated surface. pt needing cues to push from armrests and minA to steady in standing  Ambulation/Gait Ambulation/Gait assistance: Min assist Gait Distance (Feet): 15 Feet Assistive device: Rolling walker (2 wheeled) Gait Pattern/deviations: Step-to pattern;Decreased stride length;Trunk flexed Gait velocity: decreased Gait velocity interpretation: <1.31 ft/sec, indicative of household ambulator General Gait Details: pt needing cues for posture, positioning in RW. pt states difficulties due to unfamiliarity with RW vs rollator. minA to steady and manage RW  Stairs            Wheelchair Mobility    Modified Rankin (Stroke Patients Only)       Balance Overall balance assessment: Needs assistance Sitting-balance support: No upper extremity supported;Feet supported Sitting balance-Leahy Scale: Good     Standing balance support: Bilateral upper extremity supported;During functional activity Standing balance-Leahy Scale: Poor Standing balance comment: heavy relaince on BUE support                             Pertinent Vitals/Pain Pain Assessment: No/denies pain    Home Living Family/patient expects to be discharged to:: Private residence Living Arrangements: Spouse/significant other Available  Help at Discharge: Family;Available 24 hours/day Type of Home: House Home Access: Stairs to enter Entrance Stairs-Rails: Psychiatric nurse of Steps: 1 Home Layout: Two level;1/2 bath on main level Home Equipment: Walker - 4  wheels Additional Comments: pt reports taking baths in soaking tub 1-2x/wk    Prior Function Level of Independence: Independent with assistive device(s)         Comments: use of 4-wheel walker, crawling up stairs, reports independent with ADLs and IADLs     Hand Dominance   Dominant Hand: Right    Extremity/Trunk Assessment   Upper Extremity Assessment Upper Extremity Assessment: Generalized weakness;Defer to OT evaluation    Lower Extremity Assessment Lower Extremity Assessment: Generalized weakness    Cervical / Trunk Assessment Cervical / Trunk Assessment: Normal  Communication   Communication: No difficulties  Cognition Arousal/Alertness: Awake/alert Behavior During Therapy: WFL for tasks assessed/performed Overall Cognitive Status: Within Functional Limits for tasks assessed                                 General Comments: tangential at times,  neding increased cues for safety with mobility. question insight to condition and deficits as pt blaming ambulation difficulties on use of RW instead of 4-wheel walker, but at other times admitted he is weaker after being in bed for a few days      General Comments General comments (skin integrity, edema, etc.): SpO2 93% on 2L, 88-90% on RA. BP 90s/70s with map in 80s both in supine and standing as well as after activity.    Exercises     Assessment/Plan    PT Assessment Patient needs continued PT services  PT Problem List Decreased strength;Decreased range of motion;Decreased activity tolerance;Decreased balance;Decreased mobility;Decreased coordination;Decreased safety awareness       PT Treatment Interventions DME instruction;Gait training;Stair training;Functional mobility training;Therapeutic activities;Therapeutic exercise;Balance training;Patient/family education    PT Goals (Current goals can be found in the Care Plan section)  Acute Rehab PT Goals Patient Stated Goal: regain strength PT Goal  Formulation: With patient Time For Goal Achievement: 04/27/21 Potential to Achieve Goals: Good    Frequency Min 3X/week   Barriers to discharge        Co-evaluation               AM-PAC PT "6 Clicks" Mobility  Outcome Measure Help needed turning from your back to your side while in a flat bed without using bedrails?: None Help needed moving from lying on your back to sitting on the side of a flat bed without using bedrails?: None Help needed moving to and from a bed to a chair (including a wheelchair)?: A Little Help needed standing up from a chair using your arms (e.g., wheelchair or bedside chair)?: A Lot Help needed to walk in hospital room?: A Little Help needed climbing 3-5 steps with a railing? : A Lot 6 Click Score: 18    End of Session Equipment Utilized During Treatment: Gait belt;Oxygen Activity Tolerance: Patient tolerated treatment well Patient left: in chair;with call bell/phone within reach Nurse Communication: Mobility status PT Visit Diagnosis: Other abnormalities of gait and mobility (R26.89);Muscle weakness (generalized) (M62.81)    Time: [id number] PT Time Calculation (min) (ACUTE ONLY): 37 min   Charges:   PT Evaluation $PT Eval Moderate Complexity: 1 Mod PT Treatments $Gait Training: 8-22 mins        West Carbo, PT, DPT   Acute Rehabilitation Department  Pager #: [phone]  Sandra Cockayne 04/13/2021, 1:17 PM

## 2021-04-13 NOTE — Progress Notes (Signed)
   04/13/21 0315  Assess: MEWS Score  BP 90/67  Pulse Rate 70  ECG Heart Rate 70  Assess: MEWS Score  MEWS Temp 0  MEWS Systolic 1  MEWS Pulse 0  MEWS RR 1  MEWS LOC 0  MEWS Score 2  MEWS Score Color Yellow  Notify: Provider  Provider Name/Title B. Chotiner MD  Date Provider Notified 04/13/21  Time Provider Notified 0300  Notification Type Page  Notification Reason Other (Comment) (Hypotensive)  Provider response See new orders  Date of Provider Response 04/13/21  Time of Provider Response 0303  Pt Hypotensive still, MD notified, 250 bolus ordered, and to be given to Pt. Pt is asymptomatic at this time, still

## 2021-04-13 NOTE — Progress Notes (Signed)
PROGRESS NOTE    Anthony Rubio.  [id number] DOB: [birth year] DOA: 04/08/2021 PCP: Vivi Barrack, MD    Chief Complaint  Patient presents with   Emesis    Brief Narrative:   Anthony Rubio is a 70 year old male with past medical history of HFrEF with EF <20%, atrial flutter, CAD, AICD, CKD stage III, type 2 diabetes who presented to the ED with approximately 5 days of persistent vomiting.  He reports no hematemesis, His stomach has felt "sore" but no severe abdominal pain, started developing diarrhea since arrival to the ED. He denies fevers, cough, or known ill contacts.   In the ED, he was initially hypotensive which improved marginally with IV fluid.  Labs are significant for lactic acid of 2.1, no leukocytosis, creatinine at baseline.  PCCM consulted for persistent hypotension after 2 L IV fluid.   CT abdomen/pelvis concerning for perforated bowel with potential gastric ulcer in the antrum of the stomach.     Assessment & Plan:   Active Problems:   Bowel perforation (HCC)   Sepsis with acute hypoxic respiratory failure (HCC)   Acute pulmonary edema (HCC)  Shock -Hypotensive, requiring dobutamine support initially weaned off dobutamine 10/8.   -Cardiac versus septic from perforated gastric ulcer   Gastric ulcer with contained perforation bowel Septic shock -Initially on IV Zosyn, now on Augmentin. -General surgery input greatly appreciated.  Abd exam is  benign, with no abdominal tenderness or distention -High risk for surgery, continue with conservative management, continue with PPI and antibiotics -Advanced to full liquid diet today. -Will need EGD in several weeks to determine whether etiology is from ulcerative disease versus underlying malignancy   Chronic Biventricular Heart Failure  - mixed ischemic/nonischemic cardiomyopathy -Management per cardiology/CHF team, he remains on dobutamine drip.  Heart medications remains on hold given hypotension. -Echo on 10/5  showing EF less than 20%. - Renal function improving.   -1/4 blood cultures growing Corynebacterium diphtheroids, this is contamination, no indication to treat, discussed with ID.   -Dobutamine discontinued 10/8, he was hypotensive overnight requiring 750 cc of fluid bolus, blood pressure soft this morning but no need for further bolusing or pressors, management per CHF team.   CAD:  -Chest pain, he is on aspirin and Eliquis for A. fib, both on hold given perforation. - holding statin as NPO   PAF:  -sinus Rhythm, resume back on Eliquis today as discussed with general surgery.  Stage IV CKD  -Baseline creatinine 2.5, around 3 on admission, improving with dobutamine.  Prolonged QT Monitor Electrolyte and replete, continue with telemetry.   Hypernatremia  Resolved with D5W    DVT prophylaxis: SCD Code Status: Full Family Communication: None at bedside Disposition:   Status is: Inpatient  Remains inpatient appropriate because:IV treatments appropriate due to intensity of illness or inability to take PO  Dispo: The patient is from: Home              Anticipated d/c is to: Home              Patient currently is not medically stable to d/c.   Difficult to place patient No       Consultants:  PCCM CHF General surgery   Subjective:  Patient with soft blood pressure overnight, required total of 750 cc fluid bolus.    Objective: Vitals:   04/13/21 1030 04/13/21 1045 04/13/21 1100 04/13/21 1142  BP: 90/68 1'00/77 99/76 99/78 '$  Pulse: 69 69 69 72  Resp: 18 20  19 19  Temp:    98.7 F (37.1 C)  TempSrc:    Oral  SpO2: 91% 92% 94% 93%  Weight:      Height:        Intake/Output Summary (Last 24 hours) at 04/13/2021 1153 Last data filed at 04/13/2021 1100 Gross per 24 hour  Intake 1126.2 ml  Output 251 ml  Net 875.2 ml   Filed Weights   04/09/21 0334 04/11/21 0500 04/13/21 0330  Weight: 93.3 kg 92 kg 95 kg    Examination:   Awake Alert, Oriented X 3, No new  F.N deficits, Normal affect, Symmetrical Chest wall movement, diminished at the bases RRR,No Gallops,Rubs or new Murmurs, positive JVD +ve B.Sounds, Abd Soft, No tenderness, No rebound - guarding or rigidity. No Cyanosis, Clubbing ,ankle edema present today, No new Rash or bruise      Data Reviewed: I have personally reviewed following labs and imaging studies  CBC: Recent Labs  Lab 04/08/21 0313 04/08/21 0519 04/09/21 0300 04/10/21 0557 04/11/21 0520 04/12/21 0500 04/13/21 0215  WBC 5.3  --  12.7* 7.2 5.9 4.6 4.7  NEUTROABS 4.1  --   --  5.6 4.7 3.2 3.1  HGB 17.8*   < > 18.7* 16.2 16.4 15.3 14.9  HCT 57.7*   < > 59.6* 51.3 52.6* 49.2 47.6  MCV 97.8  --  96.8 95.5 96.9 96.3 96.0  PLT 150  --  150 88* 93* 101* 101*   < > = values in this interval not displayed.    Basic Metabolic Panel: Recent Labs  Lab 04/08/21 0511 04/08/21 0519 04/09/21 0300 04/10/21 0557 04/11/21 0645 04/12/21 0500 04/13/21 0215  NA  --    < > 141 149* 149* 142 140  K  --    < > 4.3 3.7 3.7 3.3* 4.3  CL  --    < > 104 112* 115* 109 110  CO2  --   --  '23 27 25 24 23  '$ GLUCOSE  --    < > 150* 99 99 189* 150*  BUN  --    < > 43* 39* 36* 30* 26*  CREATININE  --    < > 2.87* 2.49* 1.98* 1.70* 1.61*  CALCIUM  --   --  8.6* 8.6* 8.5* 7.9* 8.0*  MG 2.5*  --  2.6*  --   --   --   --   PHOS  --   --  5.7*  --   --   --   --    < > = values in this interval not displayed.    GFR: Estimated Creatinine Clearance: 51.7 mL/min (A) (by C-G formula based on SCr of 1.61 mg/dL (H)).  Liver Function Tests: Recent Labs  Lab 04/08/21 0313  AST 28  ALT 27  ALKPHOS 81  BILITOT 1.9*  PROT 6.1*  ALBUMIN 3.0*    CBG: Recent Labs  Lab 04/08/21 1529 04/09/21 1602 04/11/21 0037 04/11/21 0130 04/11/21 0422  GLUCAP 194* 117* 63* 128* 103*     Recent Results (from the past 240 hour(s))  Resp Panel by RT-PCR (Flu A&B, Covid) Nasopharyngeal Swab     Status: None   Collection Time: 04/08/21  3:00 AM    Specimen: Nasopharyngeal Swab; Nasopharyngeal(NP) swabs in vial transport medium  Result Value Ref Range Status   SARS Coronavirus 2 by RT PCR NEGATIVE NEGATIVE Final    Comment: (NOTE) SARS-CoV-2 target nucleic acids are NOT DETECTED.  The SARS-CoV-2 RNA  is generally detectable in upper respiratory specimens during the acute phase of infection. The lowest concentration of SARS-CoV-2 viral copies this assay can detect is 138 copies/mL. A negative result does not preclude SARS-Cov-2 infection and should not be used as the sole basis for treatment or other patient management decisions. A negative result may occur with  improper specimen collection/handling, submission of specimen other than nasopharyngeal swab, presence of viral mutation(s) within the areas targeted by this assay, and inadequate number of viral copies(<138 copies/mL). A negative result must be combined with clinical observations, patient history, and epidemiological information. The expected result is Negative.  Fact Sheet for Patients:  EntrepreneurPulse.com.au  Fact Sheet for Healthcare Providers:  IncredibleEmployment.be  This test is no t yet approved or cleared by the [place] FDA and  has been authorized for detection and/or diagnosis of SARS-CoV-2 by FDA under an Emergency Use Authorization (EUA). This EUA will remain  in effect (meaning this test can be used) for the duration of the COVID-19 declaration under Section 564(b)(1) of the Act, 21 U.S.C.section 360bbb-3(b)(1), unless the authorization is terminated  or revoked sooner.       Influenza A by PCR NEGATIVE NEGATIVE Final   Influenza B by PCR NEGATIVE NEGATIVE Final    Comment: (NOTE) The Xpert Xpress SARS-CoV-2/FLU/RSV plus assay is intended as an aid in the diagnosis of influenza from Nasopharyngeal swab specimens and should not be used as a sole basis for treatment. Nasal washings and aspirates are  unacceptable for Xpert Xpress SARS-CoV-2/FLU/RSV testing.  Fact Sheet for Patients: EntrepreneurPulse.com.au  Fact Sheet for Healthcare Providers: IncredibleEmployment.be  This test is not yet approved or cleared by the [place] FDA and has been authorized for detection and/or diagnosis of SARS-CoV-2 by FDA under an Emergency Use Authorization (EUA). This EUA will remain in effect (meaning this test can be used) for the duration of the COVID-19 declaration under Section 564(b)(1) of the Act, 21 U.S.C. section 360bbb-3(b)(1), unless the authorization is terminated or revoked.  Performed at [facility], [place] [address], [place], [place] [address]   Blood Culture (routine x 2)     Status: None   Collection Time: 04/08/21  3:25 AM   Specimen: BLOOD  Result Value Ref Range Status   Specimen Description BLOOD LEFT ANTECUBITAL  Final   Special Requests   Final    BOTTLES DRAWN AEROBIC AND ANAEROBIC Blood Culture adequate volume   Culture   Final    NO GROWTH 5 DAYS Performed at [facility], [place] [address], [place], [place] [address]    Report Status 04/13/2021 FINAL  Final  Blood Culture (routine x 2)     Status: Abnormal   Collection Time: 04/08/21  3:40 AM   Specimen: BLOOD RIGHT HAND  Result Value Ref Range Status   Specimen Description BLOOD RIGHT HAND  Final   Special Requests   Final    BOTTLES DRAWN AEROBIC AND ANAEROBIC Blood Culture adequate volume   Culture  Setup Time   Final    GRAM POSITIVE RODS AEROBIC BOTTLE ONLY CRITICAL RESULT CALLED TO, READ BACK BY AND VERIFIED WITH: PHARMD ALEX L 1512 [id number] FCP    Culture (A)  Final    DIPHTHEROIDS(CORYNEBACTERIUM SPECIES) Standardized susceptibility testing for this organism is not available. Performed at [facility], [place] [address], [place], [place] [address]    Report Status 04/12/2021 FINAL  Final  MRSA Next Gen by PCR, Nasal     Status:  None   Collection  Time: 04/08/21  8:43 AM   Specimen: Nasal Mucosa; Nasal Swab  Result Value Ref Range Status   MRSA by PCR Next Gen NOT DETECTED NOT DETECTED Final    Comment: (NOTE) The GeneXpert MRSA Assay (FDA approved for NASAL specimens only), is one component of a comprehensive MRSA colonization surveillance program. It is not intended to diagnose MRSA infection nor to guide or monitor treatment for MRSA infections. Test performance is not FDA approved in patients less than 17 years old. Performed at [facility], [place] [address] [place], [place] [address]          Radiology Studies: No results found.      Scheduled Meds:  amoxicillin-clavulanate  1 tablet Oral Q12H   chlorhexidine  15 mL Mouth Rinse BID   Chlorhexidine Gluconate Cloth  6 each Topical Daily   feeding supplement  237 mL Oral TID BM   mouth rinse  15 mL Mouth Rinse q12n4p   mupirocin ointment   Topical BID   pantoprazole  40 mg Oral BID   sodium chloride flush  10-40 mL Intracatheter Q12H   Continuous Infusions:  sodium chloride 10 mL/hr at 04/10/21 1039     LOS: 5 days      Phillips Climes, MD [facility]   To contact the attending provider between 7A-7P or the covering provider during after hours 7P-7A, please log into the web site [email or web address] and access using universal [place] password for that web site. If you do not have the password, please call the hospital operator.  04/13/2021, 11:53 AM

## 2021-04-13 NOTE — Progress Notes (Signed)
   04/12/21 2308  Assess: MEWS Score  Temp 97.9 F (36.6 C)  BP (!) 85/64  Pulse Rate 77  ECG Heart Rate 70  Resp 20  SpO2 100 %  Assess: MEWS Score  MEWS Temp 0  MEWS Systolic 1  MEWS Pulse 0  MEWS RR 0  MEWS LOC 0  MEWS Score 1  MEWS Score Color Nyoka Cowden

## 2021-04-13 NOTE — Progress Notes (Signed)
   04/12/21 2308 04/12/21 2331 04/12/21 2345  Assess: MEWS Score  BP (!) 85/64 (!) 78/50 (!) 85/60  Pulse Rate 77 71 72  ECG Heart Rate 70 72 72  Resp '20 14 15  '$ SpO2  --   --  94 %  Assess: MEWS Score  MEWS Temp 0 0 0  MEWS Systolic '1 2 1  '$ MEWS Pulse 0 0 0  MEWS RR 0 0 0  MEWS LOC 0 0 0  MEWS Score '1 2 1  '$ MEWS Score Color Green Yellow Green  Pt started on 500 bolus of Lactacid Ringers at 250 ml/hr.  Will continue to monitor the Pt.

## 2021-04-13 NOTE — Progress Notes (Signed)
Progress Note  5 Days Post-Op  Subjective: Patient denies abdominal pain, n/v. Passing flatus and having BMs. Tolerating FLD.   Objective: Vital signs in last 24 hours: Temp:  [97.4 F (36.3 C)-98.7 F (37.1 C)] 98.7 F (37.1 C) (10/09 0737) Pulse Rate:  [69-82] 70 (10/09 0737) Resp:  [11-33] 20 (10/09 0737) BP: (71-111)/(50-81) 94/72 (10/09 0737) SpO2:  [86 %-100 %] 93 % (10/09 0737) Weight:  [95 kg] 95 kg (10/09 0330) Last BM Date: 04/12/21  Intake/Output from previous day: 10/08 0701 - 10/09 0700 In: 1085.1 [P.O.:240; I.V.:217.9; IV Piggyback:627.2] Out: 451 [Urine:450; Stool:1] Intake/Output this shift: Total I/O In: 120 [P.O.:120] Out: -   PE: General: WD, elderly male who is laying in bed in NAD Heart: regular, rate, and rhythm.  Lungs:  Respiratory effort nonlabored Abd: soft, NT, ND, +BS   Lab Results:  Recent Labs    04/12/21 0500 04/13/21 0215  WBC 4.6 4.7  HGB 15.3 14.9  HCT 49.2 47.6  PLT 101* 101*   BMET Recent Labs    04/12/21 0500 04/13/21 0215  NA 142 140  K 3.3* 4.3  CL 109 110  CO2 24 23  GLUCOSE 189* 150*  BUN 30* 26*  CREATININE 1.70* 1.61*  CALCIUM 7.9* 8.0*   PT/INR No results for input(s): LABPROT, INR in the last 72 hours. CMP     Component Value Date/Time   NA 140 04/13/2021 0215   NA 140 03/07/2021 0833   K 4.3 04/13/2021 0215   CL 110 04/13/2021 0215   CO2 23 04/13/2021 0215   GLUCOSE 150 (H) 04/13/2021 0215   BUN 26 (H) 04/13/2021 0215   BUN 40 (H) 03/07/2021 0833   CREATININE 1.61 (H) 04/13/2021 0215   CREATININE 1.67 (H) 08/29/2015 0751   CALCIUM 8.0 (L) 04/13/2021 0215   PROT 6.1 (L) 04/08/2021 0313   ALBUMIN 3.0 (L) 04/08/2021 0313   AST 28 04/08/2021 0313   ALT 27 04/08/2021 0313   ALKPHOS 81 04/08/2021 0313   BILITOT 1.9 (H) 04/08/2021 0313   GFRNONAA 45 (L) 04/13/2021 0215   GFRAA 39 (L) 05/15/2019 0911   Lipase     Component Value Date/Time   LIPASE 25 04/08/2021 0313        Studies/Results: No results found.  Anti-infectives: Anti-infectives (From admission, onward)    Start     Dose/Rate Route Frequency Ordered Stop   04/13/21 1000  amoxicillin-clavulanate (AUGMENTIN) 875-125 MG per tablet 1 tablet        1 tablet Oral Every 12 hours 04/13/21 0847     04/09/21 1530  piperacillin-tazobactam (ZOSYN) IVPB 3.375 g        3.375 g 12.5 mL/hr over 240 Minutes Intravenous  Once 04/09/21 1443 04/09/21 2117   04/09/21 0945  piperacillin-tazobactam (ZOSYN) IVPB 3.375 g  Status:  Discontinued        3.375 g 12.5 mL/hr over 240 Minutes Intravenous Every 8 hours 04/09/21 0853 04/13/21 0847   04/09/21 0400  vancomycin (VANCOCIN) IVPB 1000 mg/200 mL premix  Status:  Discontinued        1,000 mg 200 mL/hr over 60 Minutes Intravenous Every 24 hours 04/08/21 0724 04/09/21 0853   04/08/21 1000  ceFEPIme (MAXIPIME) 2 g in sodium chloride 0.9 % 100 mL IVPB  Status:  Discontinued        2 g 200 mL/hr over 30 Minutes Intravenous Every 12 hours 04/08/21 0724 04/09/21 0853   04/08/21 0800  metroNIDAZOLE (FLAGYL) IVPB 500 mg  Status:  Discontinued        500 mg 100 mL/hr over 60 Minutes Intravenous Every 12 hours 04/08/21 0704 04/09/21 0853   04/08/21 0345  vancomycin (VANCOREADY) IVPB 1750 mg/350 mL        1,750 mg 175 mL/hr over 120 Minutes Intravenous  Once 04/08/21 0324 04/08/21 0622   04/08/21 0330  ceFEPIme (MAXIPIME) 2 g in sodium chloride 0.9 % 100 mL IVPB        2 g 200 mL/hr over 30 Minutes Intravenous  Once 04/08/21 0324 04/08/21 0416   04/08/21 0330  metroNIDAZOLE (FLAGYL) IVPB 500 mg        500 mg 100 mL/hr over 60 Minutes Intravenous  Once 04/08/21 0324 04/08/21 0541        Assessment/Plan Gastric ulcer with contained perforation  - CT with concern for above, UGI showed large gastric ulcer with contained perforation along greater curve with possible subtle leak - no indication for surgical intervention at this time - transition PPI to PO and  abx to PO - hgb 14.9 - advance to soft diet - will need EGD in 4-6 weeks to determine whether root etiology of all this is ulcerative disease vs underlying malignancy    FEN: soft, ensure VTE: SCDs, ok to resume anticoagulation today  ID: [email or web address]   CKD stage III HFrEF s/p AICD - EF <20% 10/10/20 Atrial Fibrillation on Eliquis  T2DM Hx of HTN Tobacco abuse  LOS: 5 days    Norm Parcel, [facility] Surgery 04/13/2021, 8:47 AM Please see Amion for pager number during day hours 7:00am-4:30pm

## 2021-04-13 NOTE — Progress Notes (Signed)
   04/12/21 2308 04/12/21 2331  Assess: MEWS Score  BP (!) 85/64 (!) 78/50  Pulse Rate 77 71  ECG Heart Rate 70 72  Resp 20 14  SpO2  --  92 %  O2 Device  --  Nasal Cannula  Patient Activity (if Appropriate)  --  In bed  O2 Flow Rate (L/min)  --  2 L/min  Assess: MEWS Score  MEWS Temp 0 0  MEWS Systolic 1 2  MEWS Pulse 0 0  MEWS RR 0 0  MEWS LOC 0 0  MEWS Score 1 2  MEWS Score Color Green Yellow  Treat  Pain Scale  --  0-10  Pain Score  --  0  MD notified of b/p trending down and manual b/p - Pt is asymptomatic.

## 2021-04-13 NOTE — Plan of Care (Signed)

## 2021-04-13 NOTE — Progress Notes (Signed)
Advanced Heart Failure Rounding Note  PCP-Cardiologist: Dr. Aundra Dubin    Subjective:    CT ab: contained perforation of a gastric ulcer from PUD versus gastric cancer.  Echo: Echo EF < 20%.   DBA stopped yesterday. Co-ox 62% -> 54%  Last night BP was in 70s and received IVF. SBP now in 90s. He denies any symptoms of low BP. CVP 9   Feels fine. Denies CP,ab pain, sob, orthopnea or PND. Tolerating full liquid diet.   SCr continues to improve  Objective:   Weight Range: 95 kg Body mass index is 25.49 kg/m.   Vital Signs:   Temp:  [97.4 F (36.3 C)-98.7 F (37.1 C)] 98.7 F (37.1 C) (10/09 0737) Pulse Rate:  [69-82] 70 (10/09 0737) Resp:  [11-33] 20 (10/09 0737) BP: (71-110)/(50-81) 94/72 (10/09 0737) SpO2:  [86 %-100 %] 93 % (10/09 0737) Weight:  [95 kg] 95 kg (10/09 0330) Last BM Date: 04/12/21  Weight change: Filed Weights   04/09/21 0334 04/11/21 0500 04/13/21 0330  Weight: 93.3 kg 92 kg 95 kg    Intake/Output:   Intake/Output Summary (Last 24 hours) at 04/13/2021 1028 Last data filed at 04/13/2021 0800 Gross per 24 hour  Intake 1050.8 ml  Output 251 ml  Net 799.8 ml       Physical Exam   General:  Sitting up in bed  No resp difficulty HEENT: normal Neck: supple. JVP 8-9 Carotids 2+ bilat; no bruits. No lymphadenopathy or thryomegaly appreciated. Cor: PMI nondisplaced. Regular rate & rhythm. No rubs, gallops or murmurs. Lungs: clear Abdomen: soft, nontender, nondistended. No hepatosplenomegaly. No bruits or masses. Good bowel sounds. Extremities: no cyanosis, clubbing, rash, edema Neuro: alert & orientedx3, cranial nerves grossly intact. moves all 4 extremities w/o difficulty. Affect pleasant   Telemetry   A sensed V paced  70s Personally reviewed  Labs    CBC Recent Labs    04/12/21 0500 04/13/21 0215  WBC 4.6 4.7  NEUTROABS 3.2 3.1  HGB 15.3 14.9  HCT 49.2 47.6  MCV 96.3 96.0  PLT 101* 101*    Basic Metabolic Panel Recent Labs     04/12/21 0500 04/13/21 0215  NA 142 140  K 3.3* 4.3  CL 109 110  CO2 24 23  GLUCOSE 189* 150*  BUN 30* 26*  CREATININE 1.70* 1.61*  CALCIUM 7.9* 8.0*    Liver Function Tests No results for input(s): AST, ALT, ALKPHOS, BILITOT, PROT, ALBUMIN in the last 72 hours.  No results for input(s): LIPASE, AMYLASE in the last 72 hours.  Cardiac Enzymes No results for input(s): CKTOTAL, CKMB, CKMBINDEX, TROPONINI in the last 72 hours.  BNP: BNP (last 3 results) Recent Labs    09/09/20 0952 04/08/21 0325  BNP 2,121.4* 2,679.7*     ProBNP (last 3 results) No results for input(s): PROBNP in the last 8760 hours.   D-Dimer No results for input(s): DDIMER in the last 72 hours. Hemoglobin A1C No results for input(s): HGBA1C in the last 72 hours. Fasting Lipid Panel No results for input(s): CHOL, HDL, LDLCALC, TRIG, CHOLHDL, LDLDIRECT in the last 72 hours. Thyroid Function Tests No results for input(s): TSH, T4TOTAL, T3FREE, THYROIDAB in the last 72 hours.  Invalid input(s): FREET3  Other results:   Imaging    No results found.   Medications:     Scheduled Medications:  amoxicillin-clavulanate  1 tablet Oral Q12H   chlorhexidine  15 mL Mouth Rinse BID   Chlorhexidine Gluconate Cloth  6 each  Topical Daily   feeding supplement  237 mL Oral TID BM   mouth rinse  15 mL Mouth Rinse q12n4p   mupirocin ointment   Topical BID   pantoprazole  40 mg Oral BID   sodium chloride flush  10-40 mL Intracatheter Q12H    Infusions:  sodium chloride 10 mL/hr at 04/10/21 1039    PRN Medications: sodium chloride flush   Assessment/Plan   1. Gastric perforation with contained leak.  - UGI and CT show contained gastric perf with possible mass. GSU managing - improving w/ conservative management w/ abx and bowel rest. No indication for emergent surgical intervention at this point - Now tolerating FLD. Management per GSU  2. Positive blood cultures  - 10/4 -->BCx - 1/2 sets  Blood culture--> Gram + Rods. On Zosyn  (suspect probable contaminant) - With stomach perforation would avoid TEE   3. Chronic Biventricular Heart Failure  - mixed ischemic/nonischemic cardiomyopathy - recent worsening HF w/ increase in RV pacing s/p recent device upgrade to CRT-D 9/8 - TEE 4/22 EF <20%, RV moderately reduced  - Overall poor functional status, chronically NYHA Class IIIb at baseline  - Echo  10/5 EF < 20%.  - Diuretics have been on hold due to low CVP. BP dropped last night and received IVF. CVP now 8-9. Orthostatics checked at bedside and normal. Continue to hold diuretics today. Can add midodrine if needed  - DBA stopped  10/8 Co-ox 62% -> 54% Will follow - Holding HF meds/GDMT w/ hypotension   - Renal function continues to improve  4. CAD:  - LHC in 2011 showed large, ectatic coronaries with slow flow and moderate to severe diffuse distal vessel disease (consistent with diabetes) without good interventional options. Treated medically.  - No s/s ischemia - Not on ASA w/ Eliquis use for PAF - hold ? blocker w/ hypotension/shock  - holding statin    5. PAF:  - Maintaining NSR   6. Pre-operative Assessment  - no recent ischemic chest pain, though poor functional status at baseline. Unable to complete > 4METs of physical activity, limited by chronic HF symptoms  - Would be high risk for perioperative complications given severe biventricular HF  - EF < 20%.  - Improving with conservative management.   7. Aki on Stage IV CKD  - SCr baseline ~2.5. Creatinine 2.9>2.5 >>>1.7> 1.6 -  also possible cardiorenal/low output   8. Hypernatremia  - resolved. Sodium 140   Length of Stay: Grandfather, MD  04/13/2021, 10:28 AM  Advanced Heart Failure Team Pager [phone] (M-F; 7a - 5p)  Please contact [facility] for night-coverage after hours (5p -7a ) and weekends on [email or web address]

## 2021-04-14 DIAGNOSIS — R570 Cardiogenic shock: Secondary | ICD-10-CM | POA: Diagnosis not present

## 2021-04-14 DIAGNOSIS — I5043 Acute on chronic combined systolic (congestive) and diastolic (congestive) heart failure: Secondary | ICD-10-CM | POA: Diagnosis not present

## 2021-04-14 DIAGNOSIS — K252 Acute gastric ulcer with both hemorrhage and perforation: Secondary | ICD-10-CM | POA: Diagnosis not present

## 2021-04-14 LAB — CBC WITH DIFFERENTIAL/PLATELET
Abs Immature Granulocytes: 0.02 10*3/uL (ref 0.00–0.07)
Basophils Absolute: 0 10*3/uL (ref 0.0–0.1)
Basophils Relative: 1 %
Eosinophils Absolute: 0 10*3/uL (ref 0.0–0.5)
Eosinophils Relative: 1 %
HCT: 49.8 % (ref 39.0–52.0)
Hemoglobin: 15.6 g/dL (ref 13.0–17.0)
Immature Granulocytes: 0 %
Lymphocytes Relative: 27 %
Lymphs Abs: 1.4 10*3/uL (ref 0.7–4.0)
MCH: 29.9 pg (ref 26.0–34.0)
MCHC: 31.3 g/dL (ref 30.0–36.0)
MCV: 95.4 fL (ref 80.0–100.0)
Monocytes Absolute: 0.5 10*3/uL (ref 0.1–1.0)
Monocytes Relative: 10 %
Neutro Abs: 3.2 10*3/uL (ref 1.7–7.7)
Neutrophils Relative %: 61 %
Platelets: 110 10*3/uL — ABNORMAL LOW (ref 150–400)
RBC: 5.22 MIL/uL (ref 4.22–5.81)
RDW: 15.8 % — ABNORMAL HIGH (ref 11.5–15.5)
WBC: 5.2 10*3/uL (ref 4.0–10.5)
nRBC: 0 % (ref 0.0–0.2)

## 2021-04-14 LAB — COOXEMETRY PANEL
Carboxyhemoglobin: 1.4 % (ref 0.5–1.5)
Carboxyhemoglobin: 1 % (ref 0.5–1.5)
Carboxyhemoglobin: 1.6 % — ABNORMAL HIGH (ref 0.5–1.5)
Methemoglobin: 0.7 % (ref 0.0–1.5)
Methemoglobin: 0.7 % (ref 0.0–1.5)
Methemoglobin: 0.7 % (ref 0.0–1.5)
O2 Saturation: 46.8 %
O2 Saturation: 34 %
O2 Saturation: 55.7 %
Total hemoglobin: 15.8 g/dL (ref 12.0–16.0)
Total hemoglobin: 16 g/dL (ref 12.0–16.0)
Total hemoglobin: 15.8 g/dL (ref 12.0–16.0)

## 2021-04-14 LAB — BASIC METABOLIC PANEL
Anion gap: 6 (ref 5–15)
BUN: 29 mg/dL — ABNORMAL HIGH (ref 8–23)
CO2: 22 mmol/L (ref 22–32)
Calcium: 7.9 mg/dL — ABNORMAL LOW (ref 8.9–10.3)
Chloride: 111 mmol/L (ref 98–111)
Creatinine, Ser: 1.76 mg/dL — ABNORMAL HIGH (ref 0.61–1.24)
GFR, Estimated: 41 mL/min — ABNORMAL LOW (ref >60)
Glucose, Bld: 138 mg/dL — ABNORMAL HIGH (ref 70–99)
Potassium: 4.2 mmol/L (ref 3.5–5.1)
Sodium: 139 mmol/L (ref 135–145)

## 2021-04-14 MED ORDER — POTASSIUM CHLORIDE CRYS ER 20 MEQ PO TBCR
40.0000 meq | EXTENDED_RELEASE_TABLET | Freq: Once | ORAL | Status: AC
  Administered 2021-04-14: 40 meq via ORAL
  Filled 2021-04-14: qty 2

## 2021-04-14 MED ORDER — FUROSEMIDE 10 MG/ML IJ SOLN: 80.0000 mg | Freq: Once | INTRAMUSCULAR | Status: DC

## 2021-04-14 MED ORDER — FUROSEMIDE 10 MG/ML IJ SOLN
60.0000 mg | Freq: Two times a day (BID) | INTRAMUSCULAR | Status: DC
  Administered 2021-04-14 (×2): 60 mg via INTRAVENOUS
  Filled 2021-04-14 (×3): qty 6

## 2021-04-14 MED ORDER — AMIODARONE HCL 200 MG PO TABS
200.0000 mg | ORAL_TABLET | Freq: Every day | ORAL | Status: DC
  Administered 2021-04-14 – 2021-04-17 (×4): 200 mg via ORAL
  Filled 2021-04-14 (×4): qty 1

## 2021-04-14 MED ORDER — DOBUTAMINE IN D5W 4-5 MG/ML-% IV SOLN
1.0000 ug/kg/min | INTRAVENOUS | Status: DC
  Administered 2021-04-14: 2.5 ug/kg/min via INTRAVENOUS
  Filled 2021-04-14: qty 250

## 2021-04-14 MED ORDER — DIGOXIN 125 MCG PO TABS
0.1250 mg | ORAL_TABLET | Freq: Every day | ORAL | Status: DC
  Administered 2021-04-14 – 2021-04-17 (×4): 0.125 mg via ORAL
  Filled 2021-04-14 (×4): qty 1

## 2021-04-14 NOTE — Plan of Care (Signed)

## 2021-04-14 NOTE — Progress Notes (Addendum)
Advanced Heart Failure Rounding Note  PCP-Cardiologist: Dr. Aundra Dubin    Subjective:    CT ab: contained perforation of a gastric ulcer from PUD versus gastric cancer.  Echo: EF < 20%.   DBA stopped 10/08. Co-ox 47%. Recheck pending.   CVP 17. Denies dyspnea. ? Weight down 8 lb.  No abdominal pain.   Scr 2.87 > 1.98 > 1.61 > 1.76  Objective:   Weight Range: 91.6 kg Body mass index is 24.58 kg/m.   Vital Signs:   Temp:  [98.4 F (36.9 C)-98.7 F (37.1 C)] 98.6 F (37 C) (10/10 0442) Pulse Rate:  [69-73] 73 (10/10 0442) Resp:  [17-20] 18 (10/10 0000) BP: (90-105)/(68-82) 103/82 (10/10 0442) SpO2:  [89 %-94 %] 92 % (10/10 0442) Weight:  [91.6 kg] 91.6 kg (10/10 0442) Last BM Date: 04/13/21  Weight change: Filed Weights   04/11/21 0500 04/13/21 0330 04/14/21 0442  Weight: 92 kg 95 kg 91.6 kg    Intake/Output:   Intake/Output Summary (Last 24 hours) at 04/14/2021 0740 Last data filed at 04/14/2021 0500 Gross per 24 hour  Intake 360 ml  Output 325 ml  Net 35 ml      Physical Exam   General:  Sitting at edge of bed. No acute distress.  HEENT: normal Neck: supple. + JVD. Carotids 2+ bilat; no bruits.  Cor: PMI nondisplaced. Regular rate & rhythm. No rubs, gallops or murmurs. Lungs: clear Abdomen: soft, nontender, nondistended. No hepatosplenomegaly. No bruits or masses. Good bowel sounds. Extremities: no cyanosis, clubbing, rash, trace edema Neuro: alert & orientedx3, cranial nerves grossly intact. moves all 4 extremities w/o difficulty. Affect pleasant    Telemetry  AV synchronous pacing (personally reviewed)  Labs    CBC Recent Labs    04/13/21 0215 04/14/21 0443  WBC 4.7 5.2  NEUTROABS 3.1 3.2  HGB 14.9 15.6  HCT 47.6 49.8  MCV 96.0 95.4  PLT 101* [id number]*   Basic Metabolic Panel Recent Labs    04/13/21 0215 04/14/21 0443  NA 140 139  K 4.3 4.2  CL 110 111  CO2 23 22  GLUCOSE 150* 138*  BUN 26* 29*  CREATININE 1.61* 1.76*   CALCIUM 8.0* 7.9*   Liver Function Tests No results for input(s): AST, ALT, ALKPHOS, BILITOT, PROT, ALBUMIN in the last 72 hours.  No results for input(s): LIPASE, AMYLASE in the last 72 hours.  Cardiac Enzymes No results for input(s): CKTOTAL, CKMB, CKMBINDEX, TROPONINI in the last 72 hours.  BNP: BNP (last 3 results) Recent Labs    09/09/20 0952 04/08/21 0325  BNP 2,121.4* 2,679.7*    ProBNP (last 3 results) No results for input(s): PROBNP in the last 8760 hours.   D-Dimer No results for input(s): DDIMER in the last 72 hours. Hemoglobin A1C No results for input(s): HGBA1C in the last 72 hours. Fasting Lipid Panel No results for input(s): CHOL, HDL, LDLCALC, TRIG, CHOLHDL, LDLDIRECT in the last 72 hours. Thyroid Function Tests No results for input(s): TSH, T4TOTAL, T3FREE, THYROIDAB in the last 72 hours.  Invalid input(s): FREET3  Other results:   Imaging    No results found.   Medications:     Scheduled Medications:  amoxicillin-clavulanate  1 tablet Oral Q12H   apixaban  5 mg Oral BID   chlorhexidine  15 mL Mouth Rinse BID   Chlorhexidine Gluconate Cloth  6 each Topical Daily   feeding supplement  237 mL Oral TID BM   mouth rinse  15 mL Mouth Rinse  q12n4p   mupirocin ointment   Topical BID   pantoprazole  40 mg Oral BID   sodium chloride flush  10-40 mL Intracatheter Q12H    Infusions:  sodium chloride 10 mL/hr at 04/10/21 1039    PRN Medications: sodium chloride flush   Assessment/Plan   1. Gastric perforation with contained leak.  - UGI and CT show contained gastric perf with possible mass. GSU managing. Will need EGD in 4-6 weeks to determine if d/t ulcerative disease or malignancy - improving w/ conservative management w/ abx and bowel rest. No indication for emergent surgical intervention at this point.   - Now tolerating FLD. Management per GSU. - PPI  2. Positive blood cultures  - 10/4 -->BCx - 1/2 sets Blood culture--> Gram +  Rods. On Augmentin  (suspect contaminant) - With stomach perforation would avoid TEE   3. Chronic Biventricular Heart Failure  - mixed ischemic/nonischemic cardiomyopathy - recent worsening HF w/ increase in RV pacing s/p recent device upgrade to CRT-D 9/8 - TEE 4/22 EF <20%, RV moderately reduced  - Echo 10/05 EF < 20%, RV mildly reduced, RVSP 57 mmHg, mild MR, mild to moderate TR, dilated IVC with estimated RAP 15 mmHg - Overall poor functional status, chronically NYHA Class IIIb at baseline  - Echo  10/5 EF < 20%.  - Diuretics have been on hold due to low CVP. BP dropped night of 10/08 and received IVF. CVP now 17. Will give 80 mg lasix IV once. - DBA stopped 10/8. Co-ox 47%, rechecking.   - Holding HF meds/GDMT w/ hypotension. SBP somewhat improved, now upper 90s - low 100s.  - Consider spiro if BP remains stable. - Can add midodrine if needed  - Follow renal function.  4. CAD:  - LHC in 2011 showed large, ectatic coronaries with slow flow and moderate to severe diffuse distal vessel disease (consistent with diabetes) without good interventional options. Treated medically.  - No s/s ischemia - Not on ASA w/ Eliquis use for PAF - hold ? blocker w/ hypotension/shock  - holding statin    5. PAF:  - Maintaining NSR   6. Pre-operative Assessment  - no recent ischemic chest pain, though poor functional status at baseline. Unable to complete > 4METs of physical activity, limited by chronic HF symptoms  - Would be high risk for perioperative complications given severe biventricular HF  - EF < 20%.  - Improving with conservative management.   7. Aki on Stage IV CKD  - SCr baseline ~2.5. Creatinine 2.9>2.5 >>>1.7> 1.6> 1.76 -  also possible cardiorenal/low output   8. Hypernatremia  - resolved.  - Sodium 139  9. Nocturnal desaturations - Suspect untreated OSA   Length of Stay: 6  FINCH, LINDSAY N, PA-C  04/14/2021, 7:40 AM  Advanced Heart Failure Team Pager [phone] (M-F;  7a - 5p)  Please contact [facility] for night-coverage after hours (5p -7a ) and weekends on [email or web address]  Patient seen with PA, agree with the above note.   Recheck co-ox this morning was 34%.  CVP 12 on my read.  Patient denies dyspnea or abdominal pain.   He is requiring oxygen currently. Creatinine fairly stable at 1.76.   General: NAD Neck: JVP 12 cm, no thyromegaly or thyroid nodule.  Lungs: Clear to auscultation bilaterally with normal respiratory effort. CV: Lateral PMI.  Heart regular S1/S2, no S3/S4, 2/6 SEM RUSB.  No peripheral edema.   Abdomen: Soft, nontender, no hepatosplenomegaly, no distention.  Skin: Intact without  lesions or rashes.  Neurologic: Alert and oriented x 3.  Psych: Normal affect. Extremities: No clubbing or cyanosis.  HEENT: Normal.   Stable now from the standpoint of his contained gastric perforation. He will need EGD in 4-6 weeks to determine if this was PUD or malignancy.   Now, low co-ox suggesting low output HF (34% on repeat this morning).  CVP rising at 12 on my read.  - For now, restart dobutamine 2.5 mcg/kg/min.  - Lasix 60 mg IV bid today, follow response.  - Long-term, he is not likely to be a good LVAD candidate.  He is not very mobile (uses walker and does not go far) and has significant CKD.  There is also now question of gastric malignancy (see above, needs EGD in 4-6 weeks to assess).  With no definite advanced therapy end point, I am not enthusiastic about home inotrope for him.  - He has been quite limited in terms of GDMT with soft BP and CKD.  Will try adding digoxin.   He remains in NSR, can restart home amiodarone and continue apixaban.   [name] 04/14/2021 8:56 AM

## 2021-04-14 NOTE — Plan of Care (Signed)

## 2021-04-14 NOTE — Progress Notes (Signed)
Progress Note  6 Days Post-Op  Subjective: Pt frustrated and wants to go home. Tolerating soft diet. Denies n/v or abdominal pain. His wife is present at the bedside.   Objective: Vital signs in last 24 hours: Temp:  [98.4 F (36.9 C)-98.7 F (37.1 C)] 98.5 F (36.9 C) (10/10 0802) Pulse Rate:  [69-80] 70 (10/10 0957) Resp:  [17-20] 20 (10/10 0802) BP: (98-120)/(68-83) 120/83 (10/10 0802) SpO2:  [88 %-94 %] 88 % (10/10 0802) Weight:  [91.6 kg] 91.6 kg (10/10 0442) Last BM Date: 04/13/21  Intake/Output from previous day: 10/09 0701 - 10/10 0700 In: 360 [P.O.:360] Out: 325 [Urine:325] Intake/Output this shift: No intake/output data recorded.  PE: General: WD, elderly male who is laying in bed in NAD Heart: regular, rate, and rhythm.  Lungs:  Respiratory effort nonlabored Abd: soft, NT, ND, +BS   Lab Results:  Recent Labs    04/13/21 0215 04/14/21 0443  WBC 4.7 5.2  HGB 14.9 15.6  HCT 47.6 49.8  PLT 101* 110*   BMET Recent Labs    04/13/21 0215 04/14/21 0443  NA 140 139  K 4.3 4.2  CL 110 111  CO2 23 22  GLUCOSE 150* 138*  BUN 26* 29*  CREATININE 1.61* 1.76*  CALCIUM 8.0* 7.9*   PT/INR No results for input(s): LABPROT, INR in the last 72 hours. CMP     Component Value Date/Time   NA 139 04/14/2021 0443   NA 140 03/07/2021 0833   K 4.2 04/14/2021 0443   CL 111 04/14/2021 0443   CO2 22 04/14/2021 0443   GLUCOSE 138 (H) 04/14/2021 0443   BUN 29 (H) 04/14/2021 0443   BUN 40 (H) 03/07/2021 0833   CREATININE 1.76 (H) 04/14/2021 0443   CREATININE 1.67 (H) 08/29/2015 0751   CALCIUM 7.9 (L) 04/14/2021 0443   PROT 6.1 (L) 04/08/2021 0313   ALBUMIN 3.0 (L) 04/08/2021 0313   AST 28 04/08/2021 0313   ALT 27 04/08/2021 0313   ALKPHOS 81 04/08/2021 0313   BILITOT 1.9 (H) 04/08/2021 0313   GFRNONAA 41 (L) 04/14/2021 0443   GFRAA 39 (L) 05/15/2019 0911   Lipase     Component Value Date/Time   LIPASE 25 04/08/2021 0313        Studies/Results: No results found.  Anti-infectives: Anti-infectives (From admission, onward)    Start     Dose/Rate Route Frequency Ordered Stop   04/13/21 1000  amoxicillin-clavulanate (AUGMENTIN) 875-125 MG per tablet 1 tablet        1 tablet Oral Every 12 hours 04/13/21 0847     04/09/21 1530  piperacillin-tazobactam (ZOSYN) IVPB 3.375 g        3.375 g 12.5 mL/hr over 240 Minutes Intravenous  Once 04/09/21 1443 04/09/21 2117   04/09/21 0945  piperacillin-tazobactam (ZOSYN) IVPB 3.375 g  Status:  Discontinued        3.375 g 12.5 mL/hr over 240 Minutes Intravenous Every 8 hours 04/09/21 0853 04/13/21 0847   04/09/21 0400  vancomycin (VANCOCIN) IVPB 1000 mg/200 mL premix  Status:  Discontinued        1,000 mg 200 mL/hr over 60 Minutes Intravenous Every 24 hours 04/08/21 0724 04/09/21 0853   04/08/21 1000  ceFEPIme (MAXIPIME) 2 g in sodium chloride 0.9 % 100 mL IVPB  Status:  Discontinued        2 g 200 mL/hr over 30 Minutes Intravenous Every 12 hours 04/08/21 0724 04/09/21 0853   04/08/21 0800  metroNIDAZOLE (FLAGYL) IVPB 500  mg  Status:  Discontinued        500 mg 100 mL/hr over 60 Minutes Intravenous Every 12 hours 04/08/21 0704 04/09/21 0853   04/08/21 0345  vancomycin (VANCOREADY) IVPB 1750 mg/350 mL        1,750 mg 175 mL/hr over 120 Minutes Intravenous  Once 04/08/21 0324 04/08/21 0622   04/08/21 0330  ceFEPIme (MAXIPIME) 2 g in sodium chloride 0.9 % 100 mL IVPB        2 g 200 mL/hr over 30 Minutes Intravenous  Once 04/08/21 0324 04/08/21 0416   04/08/21 0330  metroNIDAZOLE (FLAGYL) IVPB 500 mg        500 mg 100 mL/hr over 60 Minutes Intravenous  Once 04/08/21 0324 04/08/21 0541        Assessment/Plan Gastric ulcer with contained perforation  - CT with concern for above, UGI showed large gastric ulcer with contained perforation along greater curve with possible subtle leak - no indication for surgical intervention at this time - Continue PPI, recommend 8  more days PO augmentin for 14d total course of abx - hgb 15.6, stable - tolerating soft diet - will need EGD in 4-6 weeks to determine whether root etiology of all this is ulcerative disease vs underlying malignancy - He has seen Dr. Paulita Fujita in the past, I will reach out to [facility] to see about trying to get him scheduled as an outpatient  - general surgery will sign off at this time, but please call with questions/concerns or change in abdominal exam    FEN: soft, ensure VTE: SCDs, eliquis ID: [email or web address]    CKD stage III HFrEF s/p AICD - EF <20% 10/10/20 Atrial Fibrillation on Eliquis  T2DM Hx of HTN Tobacco abuse  LOS: 6 days    Norm Parcel, [facility] 04/14/2021, 10:30 AM Please see Amion for pager number during day hours 7:00am-4:30pm

## 2021-04-14 NOTE — Progress Notes (Signed)
PROGRESS NOTE    Anthony Rubio.  [id number] DOB: 04-04-1951 DOA: 04/08/2021 PCP: Vivi Barrack, MD    Chief Complaint  Patient presents with   Emesis    Brief Narrative:   Anthony Rubio is a 70 year old male with past medical history of HFrEF with EF <20%, atrial flutter, CAD, AICD, CKD stage III, type 2 diabetes who presented to the ED with approximately 5 days of persistent vomiting.  He reports no hematemesis, His stomach has felt "sore" but no severe abdominal pain, started developing diarrhea since arrival to the ED. He denies fevers, cough, or known ill contacts.   In the ED, he was initially hypotensive which improved marginally with IV fluid.  Labs are significant for lactic acid of 2.1, no leukocytosis, creatinine at baseline.  PCCM consulted for persistent hypotension after 2 L IV fluid.   CT abdomen/pelvis concerning for perforated bowel with potential gastric ulcer in the antrum of the stomach.  Patient was followed closely by general surgery, it does appear his perforation is contained, and current recommendation is for conservative management especially with significant underlining heart disease where he is high risk, recommendation for p.o. antibiotics and PPI, with outpatient follow-up with GI for endoscopy in 4 weeks. -Patient with cardiogenic shock, where he is in need of pressor support, or his followed closely by CHF team.    Assessment & Plan:   Active Problems:   Bowel perforation (HCC)   Sepsis with acute hypoxic respiratory failure (HCC)   Acute pulmonary edema (HCC)  Shock -Hypotensive, requiring dobutamine support initially weaned off dobutamine 10/8.  But he is resumed on dobutamine this a.m. given evidence of low output heart failure. -Cardiac versus septic from perforated gastric ulcer   Gastric ulcer with contained perforation bowel Septic shock -Initially on IV Zosyn, now on Augmentin.  To finish total of 15 days of antibiotic as discussed with  general surgery. -General surgery input greatly appreciated.  Abd exam is  benign, with no abdominal tenderness or distention -High risk for surgery, continue with conservative management, continue with PPI and antibiotics -Tolerating soft diet. -Will need EGD in several weeks to determine whether etiology is from ulcerative disease versus underlying malignancy   Chronic Biventricular Heart Failure  - mixed ischemic/nonischemic cardiomyopathy -Management per cardiology/CHF team, off dobutamine drip for last 48 hours, but he does appear to be with low output heart failure this morning with elevated CVP and co-ox, he is back on dobutamine drip and IV Lasix.  -Echo on 10/5 showing EF less than 20%. - Renal function improving.   -1/4 blood cultures growing Corynebacterium diphtheroids, this is contamination, no indication to treat, discussed with ID.     CAD:  -Chest pain, he is on aspirin and Eliquis for A. fib, both on hold given perforation. - holding statin as NPO   PAF:  -sinus Rhythm, resume back on Eliquis today as discussed with general surgery.  Stage IV CKD  -Baseline creatinine 2.5, around 3 on admission, improving with dobutamine.  Prolonged QT Monitor Electrolyte and replete, continue with telemetry.   Hypernatremia  Resolved with D5W    DVT prophylaxis: SCD, Eliquis Code Status: Full Family Communication: None at bedside Disposition:   Status is: Inpatient  Remains inpatient appropriate because:IV treatments appropriate due to intensity of illness or inability to take PO  Dispo: The patient is from: Home              Anticipated d/c is to: Home  Patient currently is not medically stable to d/c.   Difficult to place patient No       Consultants:  PCCM CHF General surgery   Subjective: Patient denies any nausea, vomiting, reports normal colored bowel movements  Objective: Vitals:   04/14/21 0000 04/14/21 0442 04/14/21 0802 04/14/21 0957   BP: 104/78 103/82 120/83   Pulse: 72 73 80 70  Resp: 18  20   Temp: 98.4 F (36.9 C) 98.6 F (37 C) 98.5 F (36.9 C)   TempSrc: Oral Oral Oral   SpO2: 93% 92% (!) 88%   Weight:  91.6 kg    Height:        Intake/Output Summary (Last 24 hours) at 04/14/2021 1058 Last data filed at 04/14/2021 0500 Gross per 24 hour  Intake 240 ml  Output 325 ml  Net -85 ml   Filed Weights   04/11/21 0500 04/13/21 0330 04/14/21 0442  Weight: 92 kg 95 kg 91.6 kg    Examination:   Awake Alert, Oriented X 3, No new F.N deficits, Normal affect Symmetrical Chest wall movement, Good air movement bilaterally, CTAB RRR,No Gallops,Rubs or new Murmurs, No Parasternal Heave +ve B.Sounds, Abd Soft, No tenderness, No rebound - guarding or rigidity. No Cyanosis, Clubbing ,trace edema, No new Rash or bruise       Data Reviewed: I have personally reviewed following labs and imaging studies  CBC: Recent Labs  Lab 04/10/21 0557 04/11/21 0520 04/12/21 0500 04/13/21 0215 04/14/21 0443  WBC 7.2 5.9 4.6 4.7 5.2  NEUTROABS 5.6 4.7 3.2 3.1 3.2  HGB 16.2 16.4 15.3 14.9 15.6  HCT 51.3 52.6* 49.2 47.6 49.8  MCV 95.5 96.9 96.3 96.0 95.4  PLT 88* 93* 101* 101* 110*    Basic Metabolic Panel: Recent Labs  Lab 04/08/21 0511 04/08/21 0519 04/09/21 0300 04/10/21 0557 04/11/21 0645 04/12/21 0500 04/13/21 0215 04/14/21 0443  NA  --    < > 141 149* 149* 142 140 139  K  --    < > 4.3 3.7 3.7 3.3* 4.3 4.2  CL  --    < > 104 112* 115* 109 110 111  CO2  --   --  '23 27 25 24 23 22  '$ GLUCOSE  --    < > 150* 99 99 189* 150* 138*  BUN  --    < > 43* 39* 36* 30* 26* 29*  CREATININE  --    < > 2.87* 2.49* 1.98* 1.70* 1.61* 1.76*  CALCIUM  --   --  8.6* 8.6* 8.5* 7.9* 8.0* 7.9*  MG 2.5*  --  2.6*  --   --   --   --   --   PHOS  --   --  5.7*  --   --   --   --   --    < > = values in this interval not displayed.    GFR: Estimated Creatinine Clearance: 47.3 mL/min (A) (by C-G formula based on SCr of 1.76  mg/dL (H)).  Liver Function Tests: Recent Labs  Lab 04/08/21 0313  AST 28  ALT 27  ALKPHOS 81  BILITOT 1.9*  PROT 6.1*  ALBUMIN 3.0*    CBG: Recent Labs  Lab 04/08/21 1529 04/09/21 1602 04/11/21 0037 04/11/21 0130 04/11/21 0422  GLUCAP 194* 117* 63* 128* 103*     Recent Results (from the past 240 hour(s))  Resp Panel by RT-PCR (Flu A&B, Covid) Nasopharyngeal Swab     Status: None  Collection Time: 04/08/21  3:00 AM   Specimen: Nasopharyngeal Swab; Nasopharyngeal(NP) swabs in vial transport medium  Result Value Ref Range Status   SARS Coronavirus 2 by RT PCR NEGATIVE NEGATIVE Final    Comment: (NOTE) SARS-CoV-2 target nucleic acids are NOT DETECTED.  The SARS-CoV-2 RNA is generally detectable in upper respiratory specimens during the acute phase of infection. The lowest concentration of SARS-CoV-2 viral copies this assay can detect is 138 copies/mL. A negative result does not preclude SARS-Cov-2 infection and should not be used as the sole basis for treatment or other patient management decisions. A negative result may occur with  improper specimen collection/handling, submission of specimen other than nasopharyngeal swab, presence of viral mutation(s) within the areas targeted by this assay, and inadequate number of viral copies(<138 copies/mL). A negative result must be combined with clinical observations, patient history, and epidemiological information. The expected result is Negative.  Fact Sheet for Patients:  EntrepreneurPulse.com.au  Fact Sheet for Healthcare Providers:  IncredibleEmployment.be  This test is no t yet approved or cleared by the [place] FDA and  has been authorized for detection and/or diagnosis of SARS-CoV-2 by FDA under an Emergency Use Authorization (EUA). This EUA will remain  in effect (meaning this test can be used) for the duration of the COVID-19 declaration under Section 564(b)(1) of the  Act, 21 U.S.C.section 360bbb-3(b)(1), unless the authorization is terminated  or revoked sooner.       Influenza A by PCR NEGATIVE NEGATIVE Final   Influenza B by PCR NEGATIVE NEGATIVE Final    Comment: (NOTE) The Xpert Xpress SARS-CoV-2/FLU/RSV plus assay is intended as an aid in the diagnosis of influenza from Nasopharyngeal swab specimens and should not be used as a sole basis for treatment. Nasal washings and aspirates are unacceptable for Xpert Xpress SARS-CoV-2/FLU/RSV testing.  Fact Sheet for Patients: EntrepreneurPulse.com.au  Fact Sheet for Healthcare Providers: IncredibleEmployment.be  This test is not yet approved or cleared by the [place] FDA and has been authorized for detection and/or diagnosis of SARS-CoV-2 by FDA under an Emergency Use Authorization (EUA). This EUA will remain in effect (meaning this test can be used) for the duration of the COVID-19 declaration under Section 564(b)(1) of the Act, 21 U.S.C. section 360bbb-3(b)(1), unless the authorization is terminated or revoked.  Performed at [facility], [place] [address], [place], [place] [address]   Blood Culture (routine x 2)     Status: None   Collection Time: 04/08/21  3:25 AM   Specimen: BLOOD  Result Value Ref Range Status   Specimen Description BLOOD LEFT ANTECUBITAL  Final   Special Requests   Final    BOTTLES DRAWN AEROBIC AND ANAEROBIC Blood Culture adequate volume   Culture   Final    NO GROWTH 5 DAYS Performed at [facility], [place] [address] [place], [place] [address]    Report Status 04/13/2021 FINAL  Final  Blood Culture (routine x 2)     Status: Abnormal   Collection Time: 04/08/21  3:40 AM   Specimen: BLOOD RIGHT HAND  Result Value Ref Range Status   Specimen Description BLOOD RIGHT HAND  Final   Special Requests   Final    BOTTLES DRAWN AEROBIC AND ANAEROBIC Blood Culture adequate volume   Culture  Setup Time   Final    GRAM  POSITIVE RODS AEROBIC BOTTLE ONLY CRITICAL RESULT CALLED TO, READ BACK BY AND VERIFIED WITH: PHARMD ALEX L 1512 [id number] FCP    Culture (  A)  Final    DIPHTHEROIDS(CORYNEBACTERIUM SPECIES) Standardized susceptibility testing for this organism is not available. Performed at [facility], [place] [address], [place], [place] [address]    Report Status 04/12/2021 FINAL  Final  MRSA Next Gen by PCR, Nasal     Status: None   Collection Time: 04/08/21  8:43 AM   Specimen: Nasal Mucosa; Nasal Swab  Result Value Ref Range Status   MRSA by PCR Next Gen NOT DETECTED NOT DETECTED Final    Comment: (NOTE) The GeneXpert MRSA Assay (FDA approved for NASAL specimens only), is one component of a comprehensive MRSA colonization surveillance program. It is not intended to diagnose MRSA infection nor to guide or monitor treatment for MRSA infections. Test performance is not FDA approved in patients less than 75 years old. Performed at [facility], [place] [address] [place],  [address]          Radiology Studies: No results found.      Scheduled Meds:  amiodarone  200 mg Oral Daily   amoxicillin-clavulanate  1 tablet Oral Q12H   apixaban  5 mg Oral BID   chlorhexidine  15 mL Mouth Rinse BID   Chlorhexidine Gluconate Cloth  6 each Topical Daily   digoxin  0.125 mg Oral Daily   feeding supplement  237 mL Oral TID BM   furosemide  60 mg Intravenous BID   mouth rinse  15 mL Mouth Rinse q12n4p   mupirocin ointment   Topical BID   pantoprazole  40 mg Oral BID   sodium chloride flush  10-40 mL Intracatheter Q12H   Continuous Infusions:  sodium chloride 10 mL/hr at 04/10/21 1039   DOBUTamine 2.5 mcg/kg/min (04/14/21 1018)     LOS: 6 days      Phillips Climes, MD [facility]   To contact the attending provider between 7A-7P or the covering provider during after hours 7P-7A, please log into the web site [email or web address] and access using universal [place]  password for that web site. If you do not have the password, please call the hospital operator.  04/14/2021, 10:58 AM

## 2021-04-14 NOTE — Progress Notes (Signed)
Physical Therapy Treatment Patient Details Name: Anthony Rubio. MRN: [id number] DOB: 1951-04-27 Today's Date: 04/14/2021   History of Present Illness The pt is a 70 yo male presenting 10/4 with x5 days vomiting, and hypotension. CT abdomen revealed perforated bowel. PMH includes: HFrEF (EF < 20%), atrial flutter, CAD, ACID, CKD III, and DM II.     PT Comments    The pt was able to demo good progress with mobility this session. He requested use of 4-wheel walker (device he uses at home) and was able to tolerate short bout of hallway ambulation on RA with VSS. The pt demos poor strength, power, and endurance currently, especially in BLE, which is likely due to prolonged bedrest during admission. The pt is motivated to return to prior level of mobility and independence for which I recommend continues therapies following d/c. During today's session the pt stated repeatedly he does not need follow up care as "everything will return to normal" once he gets home. Continued to encourage both progressive mobility at home and role of skilled PT to improve LE strength, stability, and endurance after d/c. Will be safe to return home with family when medically cleared.     Recommendations for follow up therapy are one component of a multi-disciplinary discharge planning process, led by the attending physician.  Recommendations may be updated based on patient status, additional functional criteria and insurance authorization.  Follow Up Recommendations  Home health PT;Supervision for mobility/OOB     Equipment Recommendations  3in1 (PT)    Recommendations for Other Services       Precautions / Restrictions Precautions Precautions: Fall Precaution Comments: watch SpO2 Restrictions Weight Bearing Restrictions: No     Mobility  Bed Mobility Overal bed mobility: Modified Independent             General bed mobility comments: no assist, HOB elevated    Transfers Overall transfer level:  Needs assistance Equipment used: 4-wheeled walker Transfers: Sit to/from Stand Sit to Stand: Min assist;Min guard         General transfer comment: minA to steady, but minGwith subsequent reps. cues for hand position as pt sitting prior to backing up entirely to chair  Ambulation/Gait Ambulation/Gait assistance: Min assist Gait Distance (Feet): 45 Feet Assistive device: 4-wheeled walker Gait Pattern/deviations: Step-through pattern;Decreased stride length;Decreased dorsiflexion - right;Decreased dorsiflexion - left;Shuffle;Trunk flexed Gait velocity: decreased Gait velocity interpretation: <1.31 ft/sec, indicative of household ambulator General Gait Details: pt with trunk flexed on 4-wheel walker, but able to navigate around obstacles without assist. decreased DF and hip flexion bilaterally, pt reports it is due to laying in bed for a few days with his legs bent      Balance Overall balance assessment: Needs assistance Sitting-balance support: No upper extremity supported;Feet supported Sitting balance-Leahy Scale: Good     Standing balance support: Bilateral upper extremity supported;During functional activity Standing balance-Leahy Scale: Poor Standing balance comment: heavy reliance on BUE support                            Cognition Arousal/Alertness: Awake/alert Behavior During Therapy: WFL for tasks assessed/performed Overall Cognitive Status: Within Functional Limits for tasks assessed                                 General Comments: pt able to follow all instructions, slightly decreased insight to need for assist and continued  rehab      Exercises      General Comments General comments (skin integrity, edema, etc.): pt on 3L upon arrival, maintained SpO2 92-94% on RA with ambulation, but difficult to get accurate SpO2 reading. left on 1L O2 with SpO2 on 99%      Pertinent Vitals/Pain Pain Assessment: No/denies pain     PT Goals  (current goals can now be found in the care plan section) Acute Rehab PT Goals Patient Stated Goal: regain strength PT Goal Formulation: With patient Time For Goal Achievement: 04/27/21 Potential to Achieve Goals: Good Progress towards PT goals: Progressing toward goals    Frequency    Min 3X/week      PT Plan Current plan remains appropriate       AM-PAC PT "6 Clicks" Mobility   Outcome Measure  Help needed turning from your back to your side while in a flat bed without using bedrails?: None Help needed moving from lying on your back to sitting on the side of a flat bed without using bedrails?: None Help needed moving to and from a bed to a chair (including a wheelchair)?: A Little Help needed standing up from a chair using your arms (e.g., wheelchair or bedside chair)?: A Little Help needed to walk in hospital room?: A Little Help needed climbing 3-5 steps with a railing? : A Lot 6 Click Score: 19    End of Session Equipment Utilized During Treatment: Gait belt;Oxygen Activity Tolerance: Patient tolerated treatment well Patient left: with call bell/phone within reach;in bed Nurse Communication: Mobility status PT Visit Diagnosis: Other abnormalities of gait and mobility (R26.89);Muscle weakness (generalized) (M62.81)     Time: [id number] PT Time Calculation (min) (ACUTE ONLY): 36 min  Charges:  $Gait Training: 23-37 mins                     West Carbo, PT, DPT   Acute Rehabilitation Department Pager #: [phone]   Sandra Cockayne 04/14/2021, 1:57 PM

## 2021-04-15 ENCOUNTER — Other Ambulatory Visit (HOSPITAL_COMMUNITY): Payer: Self-pay

## 2021-04-15 ENCOUNTER — Encounter (HOSPITAL_COMMUNITY): Payer: Medicare Other | Admitting: Cardiology

## 2021-04-15 DIAGNOSIS — R570 Cardiogenic shock: Secondary | ICD-10-CM | POA: Diagnosis not present

## 2021-04-15 DIAGNOSIS — K252 Acute gastric ulcer with both hemorrhage and perforation: Secondary | ICD-10-CM | POA: Diagnosis not present

## 2021-04-15 DIAGNOSIS — I5043 Acute on chronic combined systolic (congestive) and diastolic (congestive) heart failure: Secondary | ICD-10-CM | POA: Diagnosis not present

## 2021-04-15 LAB — CBC
HCT: 49 % (ref 39.0–52.0)
Hemoglobin: 15.7 g/dL (ref 13.0–17.0)
MCH: 29.8 pg (ref 26.0–34.0)
MCHC: 32 g/dL (ref 30.0–36.0)
MCV: 93.2 fL (ref 80.0–100.0)
Platelets: 120 10*3/uL — ABNORMAL LOW (ref 150–400)
RBC: 5.26 MIL/uL (ref 4.22–5.81)
RDW: 15.8 % — ABNORMAL HIGH (ref 11.5–15.5)
WBC: 5.5 10*3/uL (ref 4.0–10.5)
nRBC: 0 % (ref 0.0–0.2)

## 2021-04-15 LAB — COOXEMETRY PANEL
Carboxyhemoglobin: 1.6 % — ABNORMAL HIGH (ref 0.5–1.5)
Methemoglobin: 0.6 % (ref 0.0–1.5)
O2 Saturation: 63.7 %
Total hemoglobin: 15.9 g/dL (ref 12.0–16.0)

## 2021-04-15 LAB — BASIC METABOLIC PANEL
Anion gap: 9 (ref 5–15)
BUN: 28 mg/dL — ABNORMAL HIGH (ref 8–23)
CO2: 23 mmol/L (ref 22–32)
Calcium: 8.4 mg/dL — ABNORMAL LOW (ref 8.9–10.3)
Chloride: 108 mmol/L (ref 98–111)
Creatinine, Ser: 1.55 mg/dL — ABNORMAL HIGH (ref 0.61–1.24)
GFR, Estimated: 48 mL/min — ABNORMAL LOW (ref >60)
Glucose, Bld: 169 mg/dL — ABNORMAL HIGH (ref 70–99)
Potassium: 3.8 mmol/L (ref 3.5–5.1)
Sodium: 140 mmol/L (ref 135–145)

## 2021-04-15 MED ORDER — TORSEMIDE 20 MG PO TABS
40.0000 mg | ORAL_TABLET | Freq: Every day | ORAL | Status: DC
  Filled 2021-04-15: qty 2

## 2021-04-15 MED ORDER — TORSEMIDE 20 MG PO TABS
20.0000 mg | ORAL_TABLET | Freq: Every day | ORAL | Status: DC
  Administered 2021-04-15 – 2021-04-16 (×2): 20 mg via ORAL
  Filled 2021-04-15: qty 1

## 2021-04-15 MED ORDER — FUROSEMIDE 20 MG PO TABS: 30.0000 mg | ORAL_TABLET | ORAL | Status: DC

## 2021-04-15 MED ORDER — POTASSIUM CHLORIDE CRYS ER 20 MEQ PO TBCR
30.0000 meq | EXTENDED_RELEASE_TABLET | Freq: Four times a day (QID) | ORAL | Status: AC
Start: 2021-04-15 — End: 2021-04-15
  Administered 2021-04-15 (×2): 30 meq via ORAL
  Filled 2021-04-15 (×2): qty 1

## 2021-04-15 MED ORDER — SPIRONOLACTONE 12.5 MG HALF TABLET
12.5000 mg | ORAL_TABLET | Freq: Every day | ORAL | Status: DC
  Administered 2021-04-15 – 2021-04-17 (×3): 12.5 mg via ORAL
  Filled 2021-04-15 (×3): qty 1

## 2021-04-15 NOTE — Progress Notes (Signed)
Physical Therapy Treatment Patient Details Name: Anthony Rubio. MRN: [id number] DOB: 05-09-1951 Today's Date: 04/15/2021   History of Present Illness Pt is a 70 y.o. male admitted 04/08/21 with vomiting, hypotension. Abdominal CT showed perforated bowel; plan for conservative management. Pt also with septic and cardiogenic shock. PMH includes HF (EF<20%), aflutter, CAD, AICD, CKD 3, DM2.   PT Comments    Pt progressing with mobility. Today's session focused on improving activity tolerance and LE strengthening; pt mobilizing with rollator and min guard for balance; noted DOE 3/4 with activity. Pt remains limited by generalized weakness, decreased activity tolerance, and impaired balance strategies/postural reactions. Continue to recommend HHPT to maximize functional mobility and independence upon return home.  SpO2 down to 84% on RA with activity, >/89% on 3L O2 with activity Of note - difficulty getting reliable SpO2 reading via finger probe    Recommendations for follow up therapy are one component of a multi-disciplinary discharge planning process, led by the attending physician.  Recommendations may be updated based on patient status, additional functional criteria and insurance authorization.  Follow Up Recommendations  Home health PT;Supervision for mobility/OOB     Equipment Recommendations  3in1 (PT)    Recommendations for Other Services       Precautions / Restrictions Precautions Precautions: Fall;Other (comment) Precaution Comments: watch SpO2 Restrictions Weight Bearing Restrictions: No     Mobility  Bed Mobility Overal bed mobility: Modified Independent             General bed mobility comments: HOB slightly elevated    Transfers Overall transfer level: Needs assistance Equipment used: 4-wheeled walker Transfers: Sit to/from Stand Sit to Stand: Min guard         General transfer comment: Pt requesting rollator handles raised so it was better height  for him to pull/push up from, educ on sequencing and hand placement, able to stand from bed, low toilet height and recliner with min guard for balance; initial cues for locking rollator brakes  Ambulation/Gait Ambulation/Gait assistance: Min guard Gait Distance (Feet): 40 Feet Assistive device: 4-wheeled walker Gait Pattern/deviations: Step-through pattern;Decreased stride length;Trunk flexed Gait velocity: Decreased   General Gait Details: Slow, mildly unsteady gait with rollator and min guard for balance; ambulating throughout room with good ability to navigate around obstalces and into/out of small bathroom; noted increased hip/knee flexion which pt attributes "bent legs from laying in bed"   Stairs Stairs:  (pt declined stair training before d/c; shares technique of ascending steps into home with combo use of chair and rollator)           Wheelchair Mobility    Modified Rankin (Stroke Patients Only)       Balance Overall balance assessment: Needs assistance Sitting-balance support: No upper extremity supported;Feet supported Sitting balance-Leahy Scale: Good Sitting balance - Comments: indep with pericare sitting on toilet   Standing balance support: Bilateral upper extremity supported;During functional activity;Single extremity supported Standing balance-Leahy Scale: Poor Standing balance comment: Reliant on UE support                            Cognition Arousal/Alertness: Awake/alert Behavior During Therapy: WFL for tasks assessed/performed Overall Cognitive Status: Within Functional Limits for tasks assessed                                 General Comments: WFL for simple tasks; apparent decreased insight  into need for assist and continued rehab      Exercises Other Exercises Other Exercises: 5x repeated sit<>stand from recliner to rolllator (heavy reliance on UE support to push into standing)    General Comments General comments  (skin integrity, edema, etc.): SpO2 down to 84% on RA with activity (difficult getting accurate pleth reading); >/89% on 3L O2; HR 80s-90s      Pertinent Vitals/Pain Pain Assessment: No/denies pain    Home Living                      Prior Function            PT Goals (current goals can now be found in the care plan section) Progress towards PT goals: Progressing toward goals    Frequency    Min 3X/week      PT Plan Current plan remains appropriate    Co-evaluation              AM-PAC PT "6 Clicks" Mobility   Outcome Measure  Help needed turning from your back to your side while in a flat bed without using bedrails?: None Help needed moving from lying on your back to sitting on the side of a flat bed without using bedrails?: None Help needed moving to and from a bed to a chair (including a wheelchair)?: A Little Help needed standing up from a chair using your arms (e.g., wheelchair or bedside chair)?: A Little Help needed to walk in hospital room?: A Little Help needed climbing 3-5 steps with a railing? : A Lot 6 Click Score: 19    End of Session Equipment Utilized During Treatment: Gait belt;Oxygen Activity Tolerance: Patient tolerated treatment well Patient left: in chair;with call bell/phone within reach Nurse Communication: Mobility status;Other (comment) (RN ok with no chair alarm) PT Visit Diagnosis: Other abnormalities of gait and mobility (R26.89);Muscle weakness (generalized) (M62.81)     Time: [id number] PT Time Calculation (min) (ACUTE ONLY): 31 min  Charges:  $Therapeutic Exercise: 8-22 mins $Therapeutic Activity: 8-22 mins                     Mabeline Caras, PT, DPT Acute Rehabilitation Services  Pager [phone] Office [place] 04/15/2021, 4:47 PM

## 2021-04-15 NOTE — Discharge Instructions (Signed)
Follow with Primary MD Vivi Barrack, MD in 7 days   Get CBC, CMP, 2 view Chest X ray checked  by Primary MD next visit.    Activity: As tolerated with Full fall precautions use walker/cane & assistance as needed   Disposition Home **   Diet: Heart Healthy ** , with feeding assistance and aspiration precautions.  For Heart failure patients - Check your Weight same time everyday, if you gain over 2 pounds, or you develop in leg swelling, experience more shortness of breath or chest pain, call your Primary MD immediately. Follow Cardiac Low Salt Diet and 1.5 lit/day fluid restriction.   On your next visit with your primary care physician please Get Medicines reviewed and adjusted.   Please request your Prim.MD to go over all Hospital Tests and Procedure/Radiological results at the follow up, please get all Hospital records sent to your Prim MD by signing hospital release before you go home.   If you experience worsening of your admission symptoms, develop shortness of breath, life threatening emergency, suicidal or homicidal thoughts you must seek medical attention immediately by calling 911 or calling your MD immediately  if symptoms less severe.  You Must read complete instructions/literature along with all the possible adverse reactions/side effects for all the Medicines you take and that have been prescribed to you. Take any new Medicines after you have completely understood and accpet all the possible adverse reactions/side effects.   Do not drive, operating heavy machinery, perform activities at heights, swimming or participation in water activities or provide baby sitting services if your were admitted for syncope or siezures until you have seen by Primary MD or a Neurologist and advised to do so again.  Do not drive when taking Pain medications.    Do not take more than prescribed Pain, Sleep and Anxiety Medications  Special Instructions: If you have smoked or chewed Tobacco   in the last 2 yrs please stop smoking, stop any regular Alcohol  and or any Recreational drug use.  Wear Seat belts while driving.   Please note  You were cared for by a hospitalist during your hospital stay. If you have any questions about your discharge medications or the care you received while you were in the hospital after you are discharged, you can call the unit and asked to speak with the hospitalist on call if the hospitalist that took care of you is not available. Once you are discharged, your primary care physician will handle any further medical issues. Please note that NO REFILLS for any discharge medications will be authorized once you are discharged, as it is imperative that you return to your primary care physician (or establish a relationship with a primary care physician if you do not have one) for your aftercare needs so that they can reassess your need for medications and monitor your lab values.

## 2021-04-15 NOTE — Progress Notes (Addendum)
Advanced Heart Failure Rounding Note  PCP-Cardiologist: Dr. Aundra Dubin    Subjective:    CT ab: contained perforation of a gastric ulcer from PUD versus gastric cancer.  Echo: EF < 20%.   DBA stopped 10/08. Restarted at 2.5 mcg/kg/min 10/10 d/t dropping Co-ox. Co-ox 64% this am.  CVP 5. Diuresing with IV lasix 60 mg BID. -3.4L yesterday. No weight recorded today.  No dyspnea. Denies recurrent abdominal pain.    Scr 2.87 > 1.98 > 1.61 > 1.76 > 1.55  Objective:   Weight Range: 91.6 kg Body mass index is 24.58 kg/m.   Vital Signs:   Temp:  [97.7 F (36.5 C)-98.5 F (36.9 C)] 98.2 F (36.8 C) (10/11 0400) Pulse Rate:  [69-80] 69 (10/11 0400) Resp:  [14-28] 19 (10/11 0400) BP: (99-120)/(75-85) 99/75 (10/11 0400) SpO2:  [88 %-96 %] 96 % (10/11 0400) Last BM Date: 04/14/21  Weight change: Filed Weights   04/11/21 0500 04/13/21 0330 04/14/21 0442  Weight: 92 kg 95 kg 91.6 kg    Intake/Output:   Intake/Output Summary (Last 24 hours) at 04/15/2021 0800 Last data filed at 04/15/2021 0400 Gross per 24 hour  Intake 794 ml  Output 4175 ml  Net -3381 ml      Physical Exam  CVP 5 General:  Lying comfortably in bed.  HEENT: normal Neck: supple. no JVD. Carotids 2+ bilat; no bruits. No lymphadenopathy or thryomegaly appreciated. Cor: PMI nondisplaced. Regular rate & rhythm. No gallops. 2/6 SEM RUSB. Lungs: clear Abdomen: soft, nontender, nondistended. Extremities: no cyanosis, clubbing, rash, edema Neuro: alert & orientedx3, cranial nerves grossly intact. moves all 4 extremities w/o difficulty. Affect pleasant   Telemetry  AV synchronous pacing, 70s (personally reviewed)  Labs    CBC Recent Labs    04/13/21 0215 04/14/21 0443 04/15/21 0410  WBC 4.7 5.2 5.5  NEUTROABS 3.1 3.2  --   HGB 14.9 15.6 15.7  HCT 47.6 49.8 49.0  MCV 96.0 95.4 93.2  PLT 101* 110* 123456*   Basic Metabolic Panel Recent Labs    04/14/21 0443 04/15/21 0410  NA 139 140  K 4.2  3.8  CL 111 108  CO2 22 23  GLUCOSE 138* 169*  BUN 29* 28*  CREATININE 1.76* 1.55*  CALCIUM 7.9* 8.4*   Liver Function Tests No results for input(s): AST, ALT, ALKPHOS, BILITOT, PROT, ALBUMIN in the last 72 hours.  No results for input(s): LIPASE, AMYLASE in the last 72 hours.  Cardiac Enzymes No results for input(s): CKTOTAL, CKMB, CKMBINDEX, TROPONINI in the last 72 hours.  BNP: BNP (last 3 results) Recent Labs    09/09/20 0952 04/08/21 0325  BNP 2,121.4* 2,679.7*    ProBNP (last 3 results) No results for input(s): PROBNP in the last 8760 hours.   D-Dimer No results for input(s): DDIMER in the last 72 hours. Hemoglobin A1C No results for input(s): HGBA1C in the last 72 hours. Fasting Lipid Panel No results for input(s): CHOL, HDL, LDLCALC, TRIG, CHOLHDL, LDLDIRECT in the last 72 hours. Thyroid Function Tests No results for input(s): TSH, T4TOTAL, T3FREE, THYROIDAB in the last 72 hours.  Invalid input(s): FREET3  Other results:   Imaging    No results found.   Medications:     Scheduled Medications:  amiodarone  200 mg Oral Daily   amoxicillin-clavulanate  1 tablet Oral Q12H   apixaban  5 mg Oral BID   chlorhexidine  15 mL Mouth Rinse BID   Chlorhexidine Gluconate Cloth  6 each Topical  Daily   digoxin  0.125 mg Oral Daily   feeding supplement  237 mL Oral TID BM   furosemide  60 mg Intravenous BID   mouth rinse  15 mL Mouth Rinse q12n4p   mupirocin ointment   Topical BID   pantoprazole  40 mg Oral BID   sodium chloride flush  10-40 mL Intracatheter Q12H    Infusions:  sodium chloride 10 mL/hr at 04/10/21 1039   DOBUTamine 2.5 mcg/kg/min (04/14/21 1018)    PRN Medications: sodium chloride flush   Assessment/Plan   1. Gastric perforation with contained leak.  - UGI and CT show contained gastric perf with possible mass. GSU managing. Will need EGD in 4-6 weeks to determine if d/t PUD or malignancy - improving w/ conservative management w/  abx and bowel rest. No indication for emergent surgical intervention at this point.   - Now tolerating FLD. Management per GSU. - PPI  2. Positive blood cultures  - 10/4 -->BCx - 1/2 sets Blood culture--> Gram + Rods. On Augmentin  (suspect contaminant) - With stomach perforation would avoid TEE   3. Chronic Biventricular Heart Failure  - mixed ischemic/nonischemic cardiomyopathy - recent worsening HF w/ increase in RV pacing s/p recent device upgrade to CRT-D 9/8 - TEE 4/22 EF <20%, RV moderately reduced  - Echo 10/05 EF < 20%, RV mildly reduced, RVSP 57 mmHg, mild MR, mild to moderate TR, dilated IVC with estimated RAP 15 mmHg - Overall poor functional status, chronically NYHA Class IIIb at baseline   - DBA stopped 10/08, restarted on 10/10 d/t dropping co-ox. Co-ox 64% this am. Will decrease dobutamine to 1 mcg/kg/min today. Hopefully can wean off tomorrow. - CVP 5. Diuresed well yesterday with 60 mg lasix IV BID. Hold diuretics - Held HF meds/GDMT w/ hypotension. SBP somewhat improved, now upper 90s - low 110s - O2 stable on 3L [place], not certain if will be able to wean off prior to discharge - Start spiro 12.5 mg daily - Continue digoxin 0.125 mg daily - Can add midodrine if needed  - Follow renal function closely. - Long-term, likely would not be a good VAD candidate. Mobility limited (ambulates short distances with a walker) and has stage III CKD. Also question of gastric malignancy (plans for repeat EGD in 4-6 weeks as above). Not certain home inotrope is option for him with no definite advanced therapy end point.   4. CAD:  - LHC in 2011 showed large, ectatic coronaries with slow flow and moderate to severe diffuse distal vessel disease (consistent with diabetes) without good interventional options. Treated medically.  - No s/s ischemia - Not on ASA w/ Eliquis use for PAF - hold ? blocker w/ hypotension/shock  - holding statin    5. PAF:  - Maintaining NSR.  - Back on amio and  anticoagulated with Eliquis   6. Pre-operative Assessment  - no recent ischemic chest pain, though poor functional status at baseline. Unable to complete > 4METs of physical activity, limited by chronic HF symptoms  - Would be high risk for perioperative complications given severe biventricular HF  - EF < 20%.  - Improving with conservative management.   7. AkI on Stage IV CKD  - SCr baseline ~2.5. Now improving with inotropes. - Creatinine 2.9>2.5 >>>1.7> 1.6> 1.76 > 1.55 -  also possible cardiorenal/low output   8. Hypernatremia  - resolved.   9. Nocturnal desaturations - Suspect untreated OSA   Encouraged patient to continue with PT. Significant BLE  weakness. Home health PT recommended at discharge. Will need to assess if requires O2 at discharge.  Length of Stay: 7  Anthony Rubio, [place], PA-C  04/15/2021, 8:00 AM  Advanced Heart Failure Team Pager [phone] (M-F; 7a - 5p)  Please contact [facility] for night-coverage after hours (5p -7a ) and weekends on [email or web address]  Patient seen with PA, agree with the above note.   Co-ox 64% on dobutamine 2.5, CVP 5.  Creatinine down to 1.55.  Feels "fine."   General: NAD Neck: No JVD, no thyromegaly or thyroid nodule.  Lungs: Clear to auscultation bilaterally with normal respiratory effort. CV: Lateral PMI.  Heart regular S1/S2, no S3/S4, no murmur.  No peripheral edema.   Abdomen: Soft, nontender, no hepatosplenomegaly, no distention.  Skin: Intact without lesions or rashes.  Neurologic: Alert and oriented x 3.  Psych: Normal affect. Extremities: No clubbing or cyanosis.  HEENT: Normal.   Will try again to wean dobutamine, decrease to 1 mcg/kg/min today.  Continue digoxin 0.125 and add spironolactone 12.5 + Farxiga 10 mg daily.  CVP down to 5, can stop IV Lasix and resume home torsemide 20 mg daily.   No abdominal pain, will need followup with GI after discharge.   He remains in NSR on amiodarone and apixaban.   Anthony Rubio 04/15/2021 9:34 AM

## 2021-04-15 NOTE — Progress Notes (Signed)
PROGRESS NOTE    Anthony Rubio.  PD:[id number] DOB: [birth year] DOA: 04/08/2021 PCP: Vivi Barrack, MD    Chief Complaint  Patient presents with   Emesis    Brief Narrative:   Anthony Rubio is a 70 year old male with past medical history of HFrEF with EF <20%, atrial flutter, CAD, AICD, CKD stage III, type 2 diabetes who presented to the ED with approximately 5 days of persistent vomiting.  He reports no hematemesis, His stomach has felt "sore" but no severe abdominal pain, started developing diarrhea since arrival to the ED. He denies fevers, cough, or known ill contacts.   In the ED, he was initially hypotensive which improved marginally with IV fluid.  Labs are significant for lactic acid of 2.1, no leukocytosis, creatinine at baseline.  PCCM consulted for persistent hypotension after 2 L IV fluid.   CT abdomen/pelvis concerning for perforated bowel with potential gastric ulcer in the antrum of the stomach.  Patient was followed closely by general surgery, it does appear his perforation is contained, and current recommendation is for conservative management especially with significant underlining heart disease where he is high risk, recommendation for p.o. antibiotics and PPI, with outpatient follow-up with GI for endoscopy in 4 weeks. -Patient with cardiogenic shock, where he is in need of pressor support,  his followed closely by CHF team.    Assessment & Plan:   Active Problems:   Bowel perforation (HCC)   Sepsis with acute hypoxic respiratory failure (HCC)   Acute pulmonary edema (HCC)  Shock -Both septic shock and cardiogenic shock, septic shock has resolved, but remains in cardiogenic shock requiring dobutamine.  Gastric ulcer with contained perforation bowel Septic shock -Initially on IV Zosyn, now on Augmentin.  To finish total of 15 days of antibiotic as discussed with general surgery. -General surgery input greatly appreciated.  Abd exam is  benign, with no abdominal  tenderness or distention -High risk for surgery, continue with conservative management, continue with PPI and antibiotics -Tolerating soft diet. -Will need EGD in several weeks to determine whether etiology is from ulcerative disease versus underlying malignancy, patient is instructed to follow-up in 4-week with Dr. Paulita Fujita regarding that.. -General surgery signed off.   Chronic Biventricular Heart Failure  - mixed ischemic/nonischemic cardiomyopathy -Management per cardiology/CHF team, he remains on dobutamine drip . -Echo on 10/5 showing EF less than 20%. - Renal function improving.   -1/4 blood cultures growing Corynebacterium diphtheroids, this is contamination, no indication to treat, discussed with ID.     CAD:  -Chest pain, he is on aspirin and Eliquis for A. fib, both on hold given perforation. - holding statin as NPO   PAF:  -sinus Rhythm, resume back on Eliquis today as discussed with general surgery.  Stage IV CKD  -Baseline creatinine 2.5, around 3 on admission, improving with dobutamine.  Prolonged QT Monitor Electrolyte and replete, continue with telemetry.   Hypernatremia  Resolved with D5W    DVT prophylaxis: SCD, Eliquis Code Status: Full Family Communication: None at bedside Disposition:   Status is: Inpatient  Remains inpatient appropriate because:IV treatments appropriate due to intensity of illness or inability to take PO  Dispo: The patient is from: Home              Anticipated d/c is to: Home              Patient currently is not medically stable to d/c.   Difficult to place patient No  Consultants:  PCCM CHF General surgery   Subjective: No significant events overnight, he denies any complaints currently.  Objective: Vitals:   04/15/21 0400 04/15/21 0845 04/15/21 0853 04/15/21 1100  BP: 99/75  104/68 126/77  Pulse: 69 69 70 70  Resp: '19  15 15  '$ Temp: 98.2 F (36.8 C)  98.2 F (36.8 C) 98 F (36.7 C)  TempSrc: Oral  Oral  Oral  SpO2: 96%  94% 97%  Weight:      Height:        Intake/Output Summary (Last 24 hours) at 04/15/2021 1228 Last data filed at 04/15/2021 0400 Gross per 24 hour  Intake 594 ml  Output 4175 ml  Net -3581 ml   Filed Weights   04/11/21 0500 04/13/21 0330 04/14/21 0442  Weight: 92 kg 95 kg 91.6 kg    Examination:  Awake Alert, Oriented X 3, No new F.N deficits, Normal affect Symmetrical Chest wall movement, Good air movement bilaterally, CTAB RRR,No Gallops,Rubs or new Murmurs, No Parasternal Heave +ve B.Sounds, Abd Soft, No tenderness, No rebound - guarding or rigidity. No Cyanosis, Clubbing, +1 edema, No new Rash or bruise       Data Reviewed: I have personally reviewed following labs and imaging studies  CBC: Recent Labs  Lab 04/10/21 0557 04/11/21 0520 04/12/21 0500 04/13/21 0215 04/14/21 0443 04/15/21 0410  WBC 7.2 5.9 4.6 4.7 5.2 5.5  NEUTROABS 5.6 4.7 3.2 3.1 3.2  --   HGB 16.2 16.4 15.3 14.9 15.6 15.7  HCT 51.3 52.6* 49.2 47.6 49.8 49.0  MCV 95.5 96.9 96.3 96.0 95.4 93.2  PLT 88* 93* 101* 101* 110* 120*    Basic Metabolic Panel: Recent Labs  Lab 04/09/21 0300 04/10/21 0557 04/11/21 0645 04/12/21 0500 04/13/21 0215 04/14/21 0443 04/15/21 0410  NA 141   < > 149* 142 140 139 140  K 4.3   < > 3.7 3.3* 4.3 4.2 3.8  CL 104   < > 115* 109 110 111 108  CO2 23   < > '25 24 23 22 23  '$ GLUCOSE 150*   < > 99 189* 150* 138* 169*  BUN 43*   < > 36* 30* 26* 29* 28*  CREATININE 2.87*   < > 1.98* 1.70* 1.61* 1.76* 1.55*  CALCIUM 8.6*   < > 8.5* 7.9* 8.0* 7.9* 8.4*  MG 2.6*  --   --   --   --   --   --   PHOS 5.7*  --   --   --   --   --   --    < > = values in this interval not displayed.    GFR: Estimated Creatinine Clearance: 53.7 mL/min (A) (by C-G formula based on SCr of 1.55 mg/dL (H)).  Liver Function Tests: No results for input(s): AST, ALT, ALKPHOS, BILITOT, PROT, ALBUMIN in the last 168 hours.   CBG: Recent Labs  Lab 04/08/21 1529  04/09/21 1602 04/11/21 0037 04/11/21 0130 04/11/21 0422  GLUCAP 194* 117* 63* 128* 103*     Recent Results (from the past 240 hour(s))  Resp Panel by RT-PCR (Flu A&B, Covid) Nasopharyngeal Swab     Status: None   Collection Time: 04/08/21  3:00 AM   Specimen: Nasopharyngeal Swab; Nasopharyngeal(NP) swabs in vial transport medium  Result Value Ref Range Status   SARS Coronavirus 2 by RT PCR NEGATIVE NEGATIVE Final    Comment: (NOTE) SARS-CoV-2 target nucleic acids are NOT DETECTED.  The SARS-CoV-2 RNA is generally  detectable in upper respiratory specimens during the acute phase of infection. The lowest concentration of SARS-CoV-2 viral copies this assay can detect is 138 copies/mL. A negative result does not preclude SARS-Cov-2 infection and should not be used as the sole basis for treatment or other patient management decisions. A negative result may occur with  improper specimen collection/handling, submission of specimen other than nasopharyngeal swab, presence of viral mutation(s) within the areas targeted by this assay, and inadequate number of viral copies(<138 copies/mL). A negative result must be combined with clinical observations, patient history, and epidemiological information. The expected result is Negative.  Fact Sheet for Patients:  EntrepreneurPulse.com.au  Fact Sheet for Healthcare Providers:  IncredibleEmployment.be  This test is no t yet approved or cleared by the [place] FDA and  has been authorized for detection and/or diagnosis of SARS-CoV-2 by FDA under an Emergency Use Authorization (EUA). This EUA will remain  in effect (meaning this test can be used) for the duration of the COVID-19 declaration under Section 564(b)(1) of the Act, 21 U.S.C.section 360bbb-3(b)(1), unless the authorization is terminated  or revoked sooner.       Influenza A by PCR NEGATIVE NEGATIVE Final   Influenza B by PCR NEGATIVE  NEGATIVE Final    Comment: (NOTE) The Xpert Xpress SARS-CoV-2/FLU/RSV plus assay is intended as an aid in the diagnosis of influenza from Nasopharyngeal swab specimens and should not be used as a sole basis for treatment. Nasal washings and aspirates are unacceptable for Xpert Xpress SARS-CoV-2/FLU/RSV testing.  Fact Sheet for Patients: EntrepreneurPulse.com.au  Fact Sheet for Healthcare Providers: IncredibleEmployment.be  This test is not yet approved or cleared by the [place] FDA and has been authorized for detection and/or diagnosis of SARS-CoV-2 by FDA under an Emergency Use Authorization (EUA). This EUA will remain in effect (meaning this test can be used) for the duration of the COVID-19 declaration under Section 564(b)(1) of the Act, 21 U.S.C. section 360bbb-3(b)(1), unless the authorization is terminated or revoked.  Performed at [facility], [place] [address] [place], [place] [address]   Blood Culture (routine x 2)     Status: None   Collection Time: 04/08/21  3:25 AM   Specimen: BLOOD  Result Value Ref Range Status   Specimen Description BLOOD LEFT ANTECUBITAL  Final   Special Requests   Final    BOTTLES DRAWN AEROBIC AND ANAEROBIC Blood Culture adequate volume   Culture   Final    NO GROWTH 5 DAYS Performed at [facility], [place] [address] [place], [place] [address]    Report Status 04/13/2021 FINAL  Final  Blood Culture (routine x 2)     Status: Abnormal   Collection Time: 04/08/21  3:40 AM   Specimen: BLOOD RIGHT HAND  Result Value Ref Range Status   Specimen Description BLOOD RIGHT HAND  Final   Special Requests   Final    BOTTLES DRAWN AEROBIC AND ANAEROBIC Blood Culture adequate volume   Culture  Setup Time   Final    GRAM POSITIVE RODS AEROBIC BOTTLE ONLY CRITICAL RESULT CALLED TO, READ BACK BY AND VERIFIED WITH: PHARMD ALEX L 1512 [id number] FCP    Culture (A)  Final    DIPHTHEROIDS(CORYNEBACTERIUM  SPECIES) Standardized susceptibility testing for this organism is not available. Performed at [facility], [place] [address], [place], [place] [address]    Report Status 04/12/2021 FINAL  Final  MRSA Next Gen by PCR, Nasal     Status: None  Collection Time: 04/08/21  8:43 AM   Specimen: Nasal Mucosa; Nasal Swab  Result Value Ref Range Status   MRSA by PCR Next Gen NOT DETECTED NOT DETECTED Final    Comment: (NOTE) The GeneXpert MRSA Assay (FDA approved for NASAL specimens only), is one component of a comprehensive MRSA colonization surveillance program. It is not intended to diagnose MRSA infection nor to guide or monitor treatment for MRSA infections. Test performance is not FDA approved in patients less than 47 years old. Performed at [facility], [place] [address], [place], [place] [address]          Radiology Studies: No results found.      Scheduled Meds:  amiodarone  200 mg Oral Daily   amoxicillin-clavulanate  1 tablet Oral Q12H   apixaban  5 mg Oral BID   chlorhexidine  15 mL Mouth Rinse BID   Chlorhexidine Gluconate Cloth  6 each Topical Daily   digoxin  0.125 mg Oral Daily   feeding supplement  237 mL Oral TID BM   mouth rinse  15 mL Mouth Rinse q12n4p   mupirocin ointment   Topical BID   pantoprazole  40 mg Oral BID   potassium chloride  30 mEq Oral Q6H   sodium chloride flush  10-40 mL Intracatheter Q12H   spironolactone  12.5 mg Oral Daily   torsemide  20 mg Oral Daily   Continuous Infusions:  sodium chloride 10 mL/hr at 04/10/21 1039   DOBUTamine 1 mcg/kg/min (04/15/21 0842)     LOS: 7 days      Phillips Climes, MD [facility]   To contact the attending provider between 7A-7P or the covering provider during after hours 7P-7A, please log into the web site [email or web address] and access using universal [place] password for that web site. If you do not have the password, please call the hospital operator.  04/15/2021, 12:28 PM

## 2021-04-16 DIAGNOSIS — I5043 Acute on chronic combined systolic (congestive) and diastolic (congestive) heart failure: Secondary | ICD-10-CM | POA: Diagnosis not present

## 2021-04-16 DIAGNOSIS — J810 Acute pulmonary edema: Secondary | ICD-10-CM | POA: Diagnosis not present

## 2021-04-16 LAB — COOXEMETRY PANEL
Carboxyhemoglobin: 1.5 % (ref 0.5–1.5)
Methemoglobin: 0.5 % (ref 0.0–1.5)
O2 Saturation: 57.3 %
Total hemoglobin: 15.7 g/dL (ref 12.0–16.0)

## 2021-04-16 LAB — BASIC METABOLIC PANEL
Anion gap: 8 (ref 5–15)
BUN: 36 mg/dL — ABNORMAL HIGH (ref 8–23)
CO2: 24 mmol/L (ref 22–32)
Calcium: 8.5 mg/dL — ABNORMAL LOW (ref 8.9–10.3)
Chloride: 104 mmol/L (ref 98–111)
Creatinine, Ser: 1.67 mg/dL — ABNORMAL HIGH (ref 0.61–1.24)
GFR, Estimated: 43 mL/min — ABNORMAL LOW (ref >60)
Glucose, Bld: 133 mg/dL — ABNORMAL HIGH (ref 70–99)
Potassium: 4.6 mmol/L (ref 3.5–5.1)
Sodium: 136 mmol/L (ref 135–145)

## 2021-04-16 LAB — CBC
HCT: 46.7 % (ref 39.0–52.0)
Hemoglobin: 15 g/dL (ref 13.0–17.0)
MCH: 29.9 pg (ref 26.0–34.0)
MCHC: 32.1 g/dL (ref 30.0–36.0)
MCV: 93.2 fL (ref 80.0–100.0)
Platelets: 159 10*3/uL (ref 150–400)
RBC: 5.01 MIL/uL (ref 4.22–5.81)
RDW: 15.9 % — ABNORMAL HIGH (ref 11.5–15.5)
WBC: 6.2 10*3/uL (ref 4.0–10.5)
nRBC: 0 % (ref 0.0–0.2)

## 2021-04-16 MED ORDER — DAPAGLIFLOZIN PROPANEDIOL 10 MG PO TABS
10.0000 mg | ORAL_TABLET | Freq: Every day | ORAL | Status: DC
  Administered 2021-04-16 – 2021-04-17 (×2): 10 mg via ORAL
  Filled 2021-04-16 (×3): qty 1

## 2021-04-16 NOTE — Progress Notes (Signed)
PROGRESS NOTE    Anthony Rubio.  [id number] DOB: 1950-09-12 DOA: 04/08/2021 PCP: Vivi Barrack, MD    Chief Complaint  Patient presents with   Emesis    Brief Narrative:   Anthony Rubio is a 70 year old male with past medical history of HFrEF with EF <20%, atrial flutter, CAD, AICD, CKD stage III, type 2 diabetes who presented to the ED with approximately 5 days of persistent vomiting.  He reports no hematemesis, His stomach has felt "sore" but no severe abdominal pain, started developing diarrhea since arrival to the ED. He denies fevers, cough, or known ill contacts.   In the ED, he was initially hypotensive which improved marginally with IV fluid.  Labs are significant for lactic acid of 2.1, no leukocytosis, creatinine at baseline.  PCCM consulted for persistent hypotension after 2 L IV fluid.   CT abdomen/pelvis concerning for perforated bowel with potential gastric ulcer in the antrum of the stomach.  Patient was followed closely by general surgery, it does appear his perforation is contained, and current recommendation is for conservative management especially with significant underlining heart disease where he is high risk, recommendation for p.o. antibiotics and PPI, with outpatient follow-up with GI for endoscopy in 4 weeks. -Patient with cardiogenic shock, where he is in need of pressor support,  his followed closely by CHF team.    Assessment & Plan:   Active Problems:   Bowel perforation (HCC)   Sepsis with acute hypoxic respiratory failure (HCC)   Acute pulmonary edema (HCC)  Septic shock and cardiogenic shock: shock has resolved, but remains in cardiogenic shock, dobutamine finally stopped.  Managed by cardiology/heart failure team  Gastric ulcer with contained perforation bowel Septic shock -Initially on IV Zosyn, now on Augmentin.  To finish total of 15 days of antibiotic as discussed with general surgery.  Abd exam is  benign, with no abdominal tenderness or  distention. High risk for surgery, continue with conservative management, continue with PPI and antibiotics. Tolerating soft diet. -Will need EGD in several weeks to determine whether etiology is from ulcerative disease versus underlying malignancy, patient is instructed to follow-up in 4-week with Dr. Paulita Fujita regarding that.. -General surgery signed off.   Chronic Biventricular Heart Failure  - mixed ischemic/nonischemic cardiomyopathy -Management per cardiology/CHF team, dobutamine is stopped today. -Echo on 10/5 showing EF less than 20%. - Renal function improving.   -1/4 blood cultures growing Corynebacterium diphtheroids, this is contamination, no indication to treat, discussed with ID.  Continue p.o. torsemide, Aldactone, digoxin.  Farxiga 10 mg added today.   CAD:  -Chest pain, he is on aspirin and Eliquis for A. fib, aspirin remains on hold but Eliquis resumed. - holding statin as NPO   PAF:  -sinus Rhythm, continue Eliquis.  AKI on stage IV CKD  -Baseline creatinine 2.5, around 3 on admission, creatinine is improving and actually is better than his baseline.  Prolonged QT Monitor Electrolyte and replete, continue with telemetry.   Hypernatremia  Resolved with D5W  DVT prophylaxis: SCD, Eliquis Code Status: Full Family Communication: 2 family members present at the bedside. Disposition:   Status is: Inpatient  Remains inpatient appropriate because:IV treatments appropriate due to intensity of illness or inability to take PO  Dispo: The patient is from: Home              Anticipated d/c is to: Home              Patient currently is not medically stable to  d/c.   Difficult to place patient No       Consultants:  PCCM CHF General surgery   Subjective: Patient seen and examined.  He has no complaints.  Objective: Vitals:   04/16/21 0318 04/16/21 0737 04/16/21 0948 04/16/21 1100  BP: 96/72 98/68  100/73  Pulse: 70  70 70  Resp: '20 18  14  '$ Temp: 98.9 F  (37.2 C) 97.9 F (36.6 C)    TempSrc: Oral     SpO2: 91% 93%  94%  Weight: 91.9 kg     Height:        Intake/Output Summary (Last 24 hours) at 04/16/2021 1346 Last data filed at 04/16/2021 1128 Gross per 24 hour  Intake 1079.21 ml  Output 2825 ml  Net -1745.79 ml    Filed Weights   04/13/21 0330 04/14/21 0442 04/16/21 0318  Weight: 95 kg 91.6 kg 91.9 kg    Examination: General exam: Appears calm and comfortable  Respiratory system: Clear to auscultation. Respiratory effort normal. Cardiovascular system: S1 & S2 heard, RRR. No JVD, murmurs, rubs, gallops or clicks. No pedal edema. Gastrointestinal system: Abdomen is nondistended, soft and nontender. No organomegaly or masses felt. Normal bowel sounds heard. Central nervous system: Alert and oriented. No focal neurological deficits. Extremities: Symmetric 5 x 5 power. Skin: No rashes, lesions or ulcers.  Psychiatry: Judgement and insight appear normal. Mood & affect appropriate.   Data Reviewed: I have personally reviewed following labs and imaging studies  CBC: Recent Labs  Lab 04/10/21 0557 04/11/21 0520 04/12/21 0500 04/13/21 0215 04/14/21 0443 04/15/21 0410 04/16/21 0335  WBC 7.2 5.9 4.6 4.7 5.2 5.5 6.2  NEUTROABS 5.6 4.7 3.2 3.1 3.2  --   --   HGB 16.2 16.4 15.3 14.9 15.6 15.7 15.0  HCT 51.3 52.6* 49.2 47.6 49.8 49.0 46.7  MCV 95.5 96.9 96.3 96.0 95.4 93.2 93.2  PLT 88* 93* 101* 101* 110* 120* 159     Basic Metabolic Panel: Recent Labs  Lab 04/12/21 0500 04/13/21 0215 04/14/21 0443 04/15/21 0410 04/16/21 0335  NA 142 140 139 140 136  K 3.3* 4.3 4.2 3.8 4.6  CL 109 110 111 108 104  CO2 '24 23 22 23 24  '$ GLUCOSE 189* 150* 138* 169* 133*  BUN 30* 26* 29* 28* 36*  CREATININE 1.70* 1.61* 1.76* 1.55* 1.67*  CALCIUM 7.9* 8.0* 7.9* 8.4* 8.5*     GFR: Estimated Creatinine Clearance: 49.8 mL/min (A) (by C-G formula based on SCr of 1.67 mg/dL (H)).  Liver Function Tests: No results for input(s): AST,  ALT, ALKPHOS, BILITOT, PROT, ALBUMIN in the last 168 hours.   CBG: Recent Labs  Lab 04/09/21 1602 04/11/21 0037 04/11/21 0130 04/11/21 0422  GLUCAP 117* 63* 128* 103*      Recent Results (from the past 240 hour(s))  Resp Panel by RT-PCR (Flu A&B, Covid) Nasopharyngeal Swab     Status: None   Collection Time: 04/08/21  3:00 AM   Specimen: Nasopharyngeal Swab; Nasopharyngeal(NP) swabs in vial transport medium  Result Value Ref Range Status   SARS Coronavirus 2 by RT PCR NEGATIVE NEGATIVE Final    Comment: (NOTE) SARS-CoV-2 target nucleic acids are NOT DETECTED.  The SARS-CoV-2 RNA is generally detectable in upper respiratory specimens during the acute phase of infection. The lowest concentration of SARS-CoV-2 viral copies this assay can detect is 138 copies/mL. A negative result does not preclude SARS-Cov-2 infection and should not be used as the sole basis for treatment or other patient  management decisions. A negative result may occur with  improper specimen collection/handling, submission of specimen other than nasopharyngeal swab, presence of viral mutation(s) within the areas targeted by this assay, and inadequate number of viral copies(<138 copies/mL). A negative result must be combined with clinical observations, patient history, and epidemiological information. The expected result is Negative.  Fact Sheet for Patients:  EntrepreneurPulse.com.au  Fact Sheet for Healthcare Providers:  IncredibleEmployment.be  This test is no t yet approved or cleared by the [place] FDA and  has been authorized for detection and/or diagnosis of SARS-CoV-2 by FDA under an Emergency Use Authorization (EUA). This EUA will remain  in effect (meaning this test can be used) for the duration of the COVID-19 declaration under Section 564(b)(1) of the Act, 21 U.S.C.section 360bbb-3(b)(1), unless the authorization is terminated  or revoked sooner.        Influenza A by PCR NEGATIVE NEGATIVE Final   Influenza B by PCR NEGATIVE NEGATIVE Final    Comment: (NOTE) The Xpert Xpress SARS-CoV-2/FLU/RSV plus assay is intended as an aid in the diagnosis of influenza from Nasopharyngeal swab specimens and should not be used as a sole basis for treatment. Nasal washings and aspirates are unacceptable for Xpert Xpress SARS-CoV-2/FLU/RSV testing.  Fact Sheet for Patients: EntrepreneurPulse.com.au  Fact Sheet for Healthcare Providers: IncredibleEmployment.be  This test is not yet approved or cleared by the [place] FDA and has been authorized for detection and/or diagnosis of SARS-CoV-2 by FDA under an Emergency Use Authorization (EUA). This EUA will remain in effect (meaning this test can be used) for the duration of the COVID-19 declaration under Section 564(b)(1) of the Act, 21 U.S.C. section 360bbb-3(b)(1), unless the authorization is terminated or revoked.  Performed at [facility], [place] [address], [place], [place] [address]   Blood Culture (routine x 2)     Status: None   Collection Time: 04/08/21  3:25 AM   Specimen: BLOOD  Result Value Ref Range Status   Specimen Description BLOOD LEFT ANTECUBITAL  Final   Special Requests   Final    BOTTLES DRAWN AEROBIC AND ANAEROBIC Blood Culture adequate volume   Culture   Final    NO GROWTH 5 DAYS Performed at [facility], [place] [address], [place], [place] [address]    Report Status 04/13/2021 FINAL  Final  Blood Culture (routine x 2)     Status: Abnormal   Collection Time: 04/08/21  3:40 AM   Specimen: BLOOD RIGHT HAND  Result Value Ref Range Status   Specimen Description BLOOD RIGHT HAND  Final   Special Requests   Final    BOTTLES DRAWN AEROBIC AND ANAEROBIC Blood Culture adequate volume   Culture  Setup Time   Final    GRAM POSITIVE RODS AEROBIC BOTTLE ONLY CRITICAL RESULT CALLED TO, READ BACK BY AND VERIFIED WITH: PHARMD  ALEX L 1512 [id number] FCP    Culture (A)  Final    DIPHTHEROIDS(CORYNEBACTERIUM SPECIES) Standardized susceptibility testing for this organism is not available. Performed at [facility], [place] [address] [place], [place] [address]    Report Status 04/12/2021 FINAL  Final  MRSA Next Gen by PCR, Nasal     Status: None   Collection Time: 04/08/21  8:43 AM   Specimen: Nasal Mucosa; Nasal Swab  Result Value Ref Range Status   MRSA by PCR Next Gen NOT DETECTED NOT DETECTED Final    Comment: (NOTE) The GeneXpert MRSA Assay (FDA approved for NASAL specimens  only), is one component of a comprehensive MRSA colonization surveillance program. It is not intended to diagnose MRSA infection nor to guide or monitor treatment for MRSA infections. Test performance is not FDA approved in patients less than 57 years old. Performed at [facility], [place] [address] [place], [place] [address]           Radiology Studies: No results found.      Scheduled Meds:  amiodarone  200 mg Oral Daily   amoxicillin-clavulanate  1 tablet Oral Q12H   apixaban  5 mg Oral BID   chlorhexidine  15 mL Mouth Rinse BID   Chlorhexidine Gluconate Cloth  6 each Topical Daily   dapagliflozin propanediol  10 mg Oral Daily   digoxin  0.125 mg Oral Daily   feeding supplement  237 mL Oral TID BM   mouth rinse  15 mL Mouth Rinse q12n4p   mupirocin ointment   Topical BID   pantoprazole  40 mg Oral BID   sodium chloride flush  10-40 mL Intracatheter Q12H   spironolactone  12.5 mg Oral Daily   torsemide  20 mg Oral Daily   Continuous Infusions:  sodium chloride 10 mL/hr at 04/10/21 1039     LOS: 8 days   Total time spent: 32 minutes Darliss Cheney, MD [facility]  To contact the attending provider between 7A-7P or the covering provider during after hours 7P-7A, please log into the web site [email or web address] and access using universal [place] password for that web site. If you do not have the  password, please call the hospital operator.  04/16/2021, 1:46 PM

## 2021-04-16 NOTE — Progress Notes (Signed)
Patient ID: Anthony Rubio., male   DOB: 20-Mar-1951, 70 y.o.   MRN: [id number]     Advanced Heart Failure Rounding Note  PCP-Cardiologist: Dr. Aundra Dubin    Subjective:    CT ab: contained perforation of a gastric ulcer from PUD versus gastric cancer.  Echo: EF < 20%.   DBA stopped 10/08. Restarted at 2.5 mcg/kg/min 10/10 d/t dropping Co-ox. He is currently on dobutamine 1 with co-ox 57% this morning, CVP 5.  Scr 2.87 > 1.98 > 1.61 > 1.76 > 1.55 > 1.67  Objective:   Weight Range: 91.9 kg Body mass index is 24.66 kg/m.   Vital Signs:   Temp:  [97.8 F (36.6 C)-99.2 F (37.3 C)] 97.9 F (36.6 C) (10/12 0737) Pulse Rate:  [70-83] 70 (10/12 0318) Resp:  [15-20] 18 (10/12 0737) BP: (96-126)/(68-83) 98/68 (10/12 0737) SpO2:  [91 %-97 %] 93 % (10/12 0737) Weight:  [91.9 kg] 91.9 kg (10/12 0318) Last BM Date: 04/15/21 (per patient RN did not witness)  Weight change: Filed Weights   04/13/21 0330 04/14/21 0442 04/16/21 0318  Weight: 95 kg 91.6 kg 91.9 kg    Intake/Output:   Intake/Output Summary (Last 24 hours) at 04/16/2021 0930 Last data filed at 04/16/2021 0531 Gross per 24 hour  Intake 1069.21 ml  Output 2875 ml  Net -1805.79 ml      Physical Exam  CVP 5 General: NAD Neck: No JVD, no thyromegaly or thyroid nodule.  Lungs: Clear to auscultation bilaterally with normal respiratory effort. CV: Lateral PMI.  Heart regular S1/S2, no S3/S4, no murmur.  No peripheral edema.   Abdomen: Soft, nontender, no hepatosplenomegaly, no distention.  Skin: Intact without lesions or rashes.  Neurologic: Alert and oriented x 3.  Psych: Normal affect. Extremities: No clubbing or cyanosis.  HEENT: Normal.    Telemetry   AV synchronous pacing, 70s (personally reviewed)  Labs    CBC Recent Labs    04/14/21 0443 04/15/21 0410 04/16/21 0335  WBC 5.2 5.5 6.2  NEUTROABS 3.2  --   --   HGB 15.6 15.7 15.0  HCT 49.8 49.0 46.7  MCV 95.4 93.2 93.2  PLT 110* 120* [id number]   Basic  Metabolic Panel Recent Labs    04/15/21 0410 04/16/21 0335  NA 140 136  K 3.8 4.6  CL 108 104  CO2 23 24  GLUCOSE 169* 133*  BUN 28* 36*  CREATININE 1.55* 1.67*  CALCIUM 8.4* 8.5*   Liver Function Tests No results for input(s): AST, ALT, ALKPHOS, BILITOT, PROT, ALBUMIN in the last 72 hours.  No results for input(s): LIPASE, AMYLASE in the last 72 hours.  Cardiac Enzymes No results for input(s): CKTOTAL, CKMB, CKMBINDEX, TROPONINI in the last 72 hours.  BNP: BNP (last 3 results) Recent Labs    09/09/20 0952 04/08/21 0325  BNP 2,121.4* 2,679.7*    ProBNP (last 3 results) No results for input(s): PROBNP in the last 8760 hours.   D-Dimer No results for input(s): DDIMER in the last 72 hours. Hemoglobin A1C No results for input(s): HGBA1C in the last 72 hours. Fasting Lipid Panel No results for input(s): CHOL, HDL, LDLCALC, TRIG, CHOLHDL, LDLDIRECT in the last 72 hours. Thyroid Function Tests No results for input(s): TSH, T4TOTAL, T3FREE, THYROIDAB in the last 72 hours.  Invalid input(s): FREET3  Other results:   Imaging    No results found.   Medications:     Scheduled Medications:  amiodarone  200 mg Oral Daily   amoxicillin-clavulanate  1 tablet Oral Q12H   apixaban  5 mg Oral BID   chlorhexidine  15 mL Mouth Rinse BID   Chlorhexidine Gluconate Cloth  6 each Topical Daily   dapagliflozin propanediol  10 mg Oral Daily   digoxin  0.125 mg Oral Daily   feeding supplement  237 mL Oral TID BM   mouth rinse  15 mL Mouth Rinse q12n4p   mupirocin ointment   Topical BID   pantoprazole  40 mg Oral BID   sodium chloride flush  10-40 mL Intracatheter Q12H   spironolactone  12.5 mg Oral Daily   torsemide  20 mg Oral Daily    Infusions:  sodium chloride 10 mL/hr at 04/10/21 1039    PRN Medications: sodium chloride flush   Assessment/Plan   1. Gastric perforation with contained leak.  - UGI and CT show contained gastric perf with possible mass. GSU  managing. Will need EGD in 4-6 weeks to determine if d/t PUD or malignancy - improving w/ conservative management w/ abx and bowel rest. No indication for emergent surgical intervention at this point.   - Now tolerating FLD. Management per GSU. - PPI  2. Positive blood cultures  - 10/4 -->BCx - 1/2 sets Blood culture--> Gram + Rods. On Augmentin (suspect contaminant).  - With stomach perforation would avoid TEE   3. Chronic Biventricular Heart Failure  - mixed ischemic/nonischemic cardiomyopathy - recent worsening HF w/ increase in RV pacing s/p recent device upgrade to CRT-D 03/13/21.  - TEE 4/22 EF <20%, RV moderately reduced  - Echo 04/09/21 EF < 20%, RV mildly reduced, RVSP 57 mmHg, mild MR, mild to moderate TR, dilated IVC with estimated RAP 15 mmHg - Overall poor functional status, chronically NYHA Class IIIb at baseline   - DBA stopped 10/08, restarted on 10/10 d/t dropping co-ox. Co-ox 57% this am on dobutamine 1 mcg/kg/min today. I am going to stop dobutamine today, reassess co-ox tomorrow. Long-term, likely would not be a good VAD candidate. Mobility limited (ambulates short distances with a walker with Charcot joints) and has stage III CKD. Also question of gastric malignancy (plans for repeat EGD in 4-6 weeks as above). Not certain home inotrope is option for him with no definite advanced therapy end point.  We discussed this at length today.  - CVP 5. Continue po torsemide.  - GDMT limited by low BP and elevated creatinine.  - Continue spironolactone 12.5 mg daily - Continue digoxin 0.125 mg daily - Add Farxiga 10 mg daily.   - Follow renal function closely. -  4. CAD:  - LHC in 2011 showed large, ectatic coronaries with slow flow and moderate to severe diffuse distal vessel disease (consistent with diabetes) without good interventional options. Treated medically.  - No chest pain.  - Not on ASA w/ Eliquis use for PAF - hold ? blocker w/ hypotension/shock  - holding statin     5. PAF:  - Maintaining NSR.  - Back on amio and anticoagulated with Eliquis   6. AkI on Stage IV CKD  - Cardiorenal syndrome.  - SCr baseline ~2.5. Now improved with inotropes. - Creatinine 2.9>2.5 >>>1.7> 1.6> 1.76 > 1.55 > 1.67  7. Nocturnal desaturations - Suspect untreated OSA  Continue to work with PT.   [name] 04/16/2021 9:30 AM

## 2021-04-17 ENCOUNTER — Other Ambulatory Visit (HOSPITAL_COMMUNITY): Payer: Self-pay

## 2021-04-17 DIAGNOSIS — J810 Acute pulmonary edema: Secondary | ICD-10-CM | POA: Diagnosis not present

## 2021-04-17 DIAGNOSIS — I509 Heart failure, unspecified: Secondary | ICD-10-CM | POA: Diagnosis not present

## 2021-04-17 LAB — CBC
HCT: 46.6 % (ref 39.0–52.0)
Hemoglobin: 14.9 g/dL (ref 13.0–17.0)
MCH: 29.6 pg (ref 26.0–34.0)
MCHC: 32 g/dL (ref 30.0–36.0)
MCV: 92.6 fL (ref 80.0–100.0)
Platelets: 191 10*3/uL (ref 150–400)
RBC: 5.03 MIL/uL (ref 4.22–5.81)
RDW: 15.9 % — ABNORMAL HIGH (ref 11.5–15.5)
WBC: 6.4 10*3/uL (ref 4.0–10.5)
nRBC: 0 % (ref 0.0–0.2)

## 2021-04-17 LAB — COOXEMETRY PANEL
Carboxyhemoglobin: 1.5 % (ref 0.5–1.5)
Methemoglobin: 0.6 % (ref 0.0–1.5)
O2 Saturation: 63.1 %
Total hemoglobin: 15.4 g/dL (ref 12.0–16.0)

## 2021-04-17 LAB — BASIC METABOLIC PANEL
Anion gap: 8 (ref 5–15)
BUN: 41 mg/dL — ABNORMAL HIGH (ref 8–23)
CO2: 27 mmol/L (ref 22–32)
Calcium: 8.6 mg/dL — ABNORMAL LOW (ref 8.9–10.3)
Chloride: 101 mmol/L (ref 98–111)
Creatinine, Ser: 1.79 mg/dL — ABNORMAL HIGH (ref 0.61–1.24)
GFR, Estimated: 40 mL/min — ABNORMAL LOW (ref >60)
Glucose, Bld: 167 mg/dL — ABNORMAL HIGH (ref 70–99)
Potassium: 4.3 mmol/L (ref 3.5–5.1)
Sodium: 136 mmol/L (ref 135–145)

## 2021-04-17 LAB — GLUCOSE, CAPILLARY: Glucose-Capillary: 157 mg/dL — ABNORMAL HIGH (ref 70–99)

## 2021-04-17 MED ORDER — AMOXICILLIN-POT CLAVULANATE 875-125 MG PO TABS
1.0000 | ORAL_TABLET | Freq: Two times a day (BID) | ORAL | 0 refills | Status: AC
  Filled 2021-04-17: qty 10, 5d supply, fill #0

## 2021-04-17 MED ORDER — SPIRONOLACTONE 25 MG PO TABS
12.5000 mg | ORAL_TABLET | Freq: Every day | ORAL | 0 refills | Status: AC
  Filled 2021-04-17: qty 15, 30d supply, fill #0

## 2021-04-17 MED ORDER — DIGOXIN 125 MCG PO TABS
0.1250 mg | ORAL_TABLET | Freq: Every day | ORAL | 0 refills | Status: DC
  Filled 2021-04-17: qty 30, 30d supply, fill #0

## 2021-04-17 MED ORDER — PANTOPRAZOLE SODIUM 40 MG PO TBEC
40.0000 mg | DELAYED_RELEASE_TABLET | Freq: Two times a day (BID) | ORAL | 0 refills | Status: DC
  Filled 2021-04-17: qty 60, 30d supply, fill #0

## 2021-04-17 NOTE — Progress Notes (Signed)
O2 tank delivered. Discharged home accompanied by wife. Belongings taken home.

## 2021-04-17 NOTE — TOC Transition Note (Addendum)
Transition of Care [facility]) - CM/SW Discharge Note   Patient Details  Name: Anthony Rubio. MRN: [id number] Date of Birth: 1951/03/22  [facility] CM/SW Contact:  Zenon Mayo, RN Phone Number: 04/17/2021, 2:58 PM   Clinical Narrative:    Patient is from home with his wife, he has a scale at home, informed patient he needs to make sure he weighs himself daily and also he needs to check his bp.  He states he eats a lot of vegetables and fruit , asked if all veges were fresh he states sometimes uses can foods, informed him to try to stay away from using can foods but if he has to use it make sure he washes the contents of the can with water to get the sodium off.  [facility]  NCM spoke with [place] PA and asked if patient needed anything else from HF Team standpoint other than HH , she states no and that they are not the attending for the patient.  The Attending is ordering HHPT.HHOT.  NCM offered choice he does not have a preference ,  he states he does not want a HHRN for CHF, NCM made referral to [facility] with [place], she is able to take referral. Soc will begin 24 to 48 hrs post dc.  NCM asked patient which oxygen company would he like to use , he states he really does not have a preference.  NCM made referral to [facility] with [employer] for home oxygen which will go thru his Medicare insurance.  He will have a copay and Thedore Mins will come to speak with him about the copay.  Per Adela Lank with [employer] copay is 14.62. per month for the oxygen.   Final next level of care: [place] Barriers to Discharge: No Barriers Identified   Patient Goals and CMS Choice Patient states their goals for this hospitalization and ongoing recovery are:: return home with [facility] CMS Medicare.gov Compare Post Acute Care list provided to:: Patient Choice offered to / list presented to : Patient  Discharge Placement                       Discharge Plan and  Services In-house Referral: Clinical Social Work              DME Arranged: Oxygen DME Agency: [employer] Date DME Agency Contacted: 04/17/21 Time DME Agency Contacted: [phone] Representative spoke with at DME Agency: Thedore Mins HH Arranged: PT, OT [place] Agency: [place] Date [place]: 04/17/21 Time [place]: 1458 Representative spoke with at [place]: [place] Determinants of Health ([place]) Interventions Food Insecurity Interventions: Intervention Not Indicated Financial Strain Interventions: Intervention Not Indicated Housing Interventions: Intervention Not Indicated Transportation Interventions: Intervention Not Indicated   Readmission Risk Interventions No flowsheet data found.

## 2021-04-17 NOTE — Progress Notes (Signed)
Physical Therapy Treatment Patient Details Name: Anthony Rubio. MRN: [id number] DOB: [birth year] Today's Date: 04/17/2021   History of Present Illness Pt is a 70 y.o. male admitted 04/08/21 with vomiting, hypotension. Abdominal CT showed perforated bowel; plan for conservative management. Pt also with septic and cardiogenic shock. PMH includes HF (EF<20%), aflutter, CAD, AICD, CKD 3, DM2.    PT Comments    Pt agreeable to session with focus on progression of mobility and endurance. Noted decreased safety awareness and ability to self-monitor for exertion at this time. Pt able to complete 69f hallway ambulation with 4-wheel walker with progressively increased difficulty, knee buckling due to fatigue, and 3/4 DOE. The pt however, continued to decline PT instructions to take a seated rest, eventually resulting in need for modA to steady due to knee buckling and LOB while RN provided chair for pt. Lengthy discussion regarding safety with mobility at home, pt adamant that all will return to normal once he goes home. Continue to recommend max therapies acutely as well as in home setting to improve pt endurance and reduce risk of falls.   SpO2 87% on RA upon arrival of PT, 94% on 3L O2. Pt ambulating on RA with 3/4 DOE but once quality reading on pulse ox, SpO2 96-99% on RA.    Recommendations for follow up therapy are one component of a multi-disciplinary discharge planning process, led by the attending physician.  Recommendations may be updated based on patient status, additional functional criteria and insurance authorization.  Follow Up Recommendations  Home health PT;Supervision for mobility/OOB     Equipment Recommendations  3in1 (PT)    Recommendations for Other Services       Precautions / Restrictions Precautions Precautions: Fall;Other (comment) Precaution Comments: watch SpO2 Restrictions Weight Bearing Restrictions: No     Mobility  Bed Mobility Overal bed mobility: Modified  Independent             General bed mobility comments: HOB slightly elevated    Transfers Overall transfer level: Needs assistance Equipment used: 4-wheeled walker Transfers: Sit to/from Stand Sit to Stand: Min guard         General transfer comment: pt previously asking therapist to raise rollator handles to improve power up, at this time states the handles are too high. pt cued to push from bed/chair surface rather than pull up on rollator but with little carry over as pt adamant he will perform best pulling on rollator.  Ambulation/Gait Ambulation/Gait assistance: Min guard;Mod assist Gait Distance (Feet): 100 Feet Assistive device: 4-wheeled walker Gait Pattern/deviations: Step-through pattern;Decreased dorsiflexion - right;Decreased dorsiflexion - left;Trunk flexed Gait velocity: Decreased Gait velocity interpretation: <1.31 ft/sec, indicative of household ambulator General Gait Details: pt maintains hip and knee flexion through all aspects of gait, limited DF (pt states this is baseline for 10 years due to neuropathy). mildly unsteady initially, progressed to x2 LOB needing modA to recover due to pt reluctance to take seated rest break. emergently lowered to rolling chair with modA of 2 due to knee buckling as pt rushing back to his room to sit due to reluctance to sit on rollator. pt educated on need for improved self monitoring of exertion with little acceptance     Balance Overall balance assessment: Needs assistance Sitting-balance support: No upper extremity supported;Feet supported Sitting balance-Leahy Scale: Good Sitting balance - Comments: indep with pericare sitting on toilet   Standing balance support: Bilateral upper extremity supported;During functional activity Standing balance-Leahy Scale: Poor Standing balance comment: Reliant on  UE support, decreased insight to safety and self-monitoring resulting in x2 LOB needing modA                             Cognition Arousal/Alertness: Awake/alert Behavior During Therapy: Flat affect;WFL for tasks assessed/performed Overall Cognitive Status: Impaired/Different from baseline Area of Impairment: Memory;Following commands;Safety/judgement;Problem solving                     Memory: Decreased short-term memory Following Commands: Follows one step commands inconsistently (likely due to poor safety awareness/insight, pt making up explanations for why he does not need to follow cues) Safety/Judgement: Decreased awareness of safety;Decreased awareness of deficits   Problem Solving: Requires verbal cues General Comments: pt able to state needs, but significantly decreased insight to deficits and need for safety measures. Admits to deficits at times, but when cued for improved safety decision making pt states it is unecessary to plan for going home as "once he gets home, he will be fine"      Exercises      General Comments General comments (skin integrity, edema, etc.): SpO2 87% on RA upon arrival of PT, 94% with good plenth on 3L. Pt ambulating with good plenth on RA with repeated cues for breathing, initially difficult times getting reading for SpO2, but after changing finger probe, reading 97% on RA. pt 3/4 DOE with mobility      Pertinent Vitals/Pain Pain Assessment: No/denies pain     PT Goals (current goals can now be found in the care plan section) Acute Rehab PT Goals Patient Stated Goal: regain strength PT Goal Formulation: With patient Time For Goal Achievement: 04/27/21 Potential to Achieve Goals: Good Progress towards PT goals: Progressing toward goals    Frequency    Min 3X/week      PT Plan Current plan remains appropriate    Co-evaluation              AM-PAC PT "6 Clicks" Mobility   Outcome Measure  Help needed turning from your back to your side while in a flat bed without using bedrails?: None Help needed moving from lying on your back to  sitting on the side of a flat bed without using bedrails?: None Help needed moving to and from a bed to a chair (including a wheelchair)?: A Little Help needed standing up from a chair using your arms (e.g., wheelchair or bedside chair)?: A Little Help needed to walk in hospital room?: A Little Help needed climbing 3-5 steps with a railing? : A Lot 6 Click Score: 19    End of Session Equipment Utilized During Treatment: Gait belt;Oxygen Activity Tolerance: Patient tolerated treatment well Patient left: in chair;with call bell/phone within reach;with family/visitor present Nurse Communication: Mobility status PT Visit Diagnosis: Other abnormalities of gait and mobility (R26.89);Muscle weakness (generalized) (M62.81)     Time: [id number] PT Time Calculation (min) (ACUTE ONLY): 54 min  Charges:  $Gait Training: 23-37 mins $Therapeutic Activity: 8-22 mins $Self Care/Home Management: 8-22                     West Carbo, PT, DPT   Acute Rehabilitation Department Pager #: [phone]   Sandra Cockayne 04/17/2021, 9:36 AM

## 2021-04-17 NOTE — Progress Notes (Signed)
CVC line d/ced tolerated well. No bleeding noted. Bedrest emphasized for 30 min.

## 2021-04-17 NOTE — Progress Notes (Signed)
SATURATION QUALIFICATIONS: (This note is used to comply with regulatory documentation for home oxygen)  Patient Saturations on Room Air at Rest = 92%  Patient Saturations on Room Air while Ambulating = 81%  Patient Saturations on 2Liters of oxygen while Ambulating =90%  Please briefly explain why patient needs home oxygen:with SOB on ambulation. Needed o2  for home use.

## 2021-04-17 NOTE — Progress Notes (Signed)
All set for discharge home awaiting oxygen delivery. [facility] Discharge instructions given to pt.

## 2021-04-17 NOTE — Progress Notes (Addendum)
Patient ID: Anthony Rubio., male   DOB: [birth year], 70 y.o.   MRN: [id number]     Advanced Heart Failure Rounding Note  PCP-Cardiologist: Dr. Aundra Dubin    Subjective:    CT ab: contained perforation of a gastric ulcer from PUD versus gastric cancer.  Echo: EF < 20%.   DBA stopped 10/08. Restarted at 2.5 mcg/kg/min 10/10 d/t dropping Co-ox.  DBA stopped again 10/12. Co-ox stable at 63%.   Scr 2.87 > 1.98 > 1.61 > 1.76 > 1.55 > 1.67>1.79  - 3.4L in UOP yesterday. Wt down 4 lb. CVP low at 2. Wilder Glade was added yesterday. SBPs low 100s.   Feels ok. Denies abdominal pain. No dyspnea. No chest pain.   Objective:   Weight Range: 89.6 kg Body mass index is 24.04 kg/m.   Vital Signs:   Temp:  [97.9 F (36.6 C)-99.6 F (37.6 C)] 98 F (36.7 C) (10/13 0723) Pulse Rate:  [69-76] 70 (10/13 0723) Resp:  [13-20] 17 (10/13 0723) BP: (96-105)/(69-78) 102/74 (10/13 0723) SpO2:  [92 %-95 %] 93 % (10/13 0723) Weight:  [89.6 kg] 89.6 kg (10/13 0256) Last BM Date: 04/16/21  Weight change: Filed Weights   04/14/21 0442 04/16/21 0318 04/17/21 0256  Weight: 91.6 kg 91.9 kg 89.6 kg    Intake/Output:   Intake/Output Summary (Last 24 hours) at 04/17/2021 0751 Last data filed at 04/17/2021 0258 Gross per 24 hour  Intake 265.73 ml  Output 3375 ml  Net -3109.27 ml      Physical Exam   CVP 2  General:  fatigued appearing, thin elderly AAM. No respiratory difficulty HEENT: normal Neck: supple. no JVD. Carotids 2+ bilat; no bruits. No lymphadenopathy or thyromegaly appreciated. Cor: PMI nondisplaced. Regular rate & rhythm. No rubs, gallops or murmurs. Lungs: clear Abdomen: soft, nontender, nondistended. No hepatosplenomegaly. No bruits or masses. Good bowel sounds. Extremities: no cyanosis, clubbing, rash, edema Neuro: alert & oriented x 3, cranial nerves grossly intact. moves all 4 extremities w/o difficulty. Affect pleasant.    Telemetry   A-paced 70s (personally reviewed)  Labs     CBC Recent Labs    04/16/21 0335 04/17/21 0353  WBC 6.2 6.4  HGB 15.0 14.9  HCT 46.7 46.6  MCV 93.2 92.6  PLT 159 [id number]   Basic Metabolic Panel Recent Labs    04/16/21 0335 04/17/21 0353  NA 136 136  K 4.6 4.3  CL 104 101  CO2 24 27  GLUCOSE 133* 167*  BUN 36* 41*  CREATININE 1.67* 1.79*  CALCIUM 8.5* 8.6*   Liver Function Tests No results for input(s): AST, ALT, ALKPHOS, BILITOT, PROT, ALBUMIN in the last 72 hours.  No results for input(s): LIPASE, AMYLASE in the last 72 hours.  Cardiac Enzymes No results for input(s): CKTOTAL, CKMB, CKMBINDEX, TROPONINI in the last 72 hours.  BNP: BNP (last 3 results) Recent Labs    09/09/20 0952 04/08/21 0325  BNP 2,121.4* 2,679.7*    ProBNP (last 3 results) No results for input(s): PROBNP in the last 8760 hours.   D-Dimer No results for input(s): DDIMER in the last 72 hours. Hemoglobin A1C No results for input(s): HGBA1C in the last 72 hours. Fasting Lipid Panel No results for input(s): CHOL, HDL, LDLCALC, TRIG, CHOLHDL, LDLDIRECT in the last 72 hours. Thyroid Function Tests No results for input(s): TSH, T4TOTAL, T3FREE, THYROIDAB in the last 72 hours.  Invalid input(s): FREET3  Other results:   Imaging    No results found.   Medications:  Scheduled Medications:  amiodarone  200 mg Oral Daily   amoxicillin-clavulanate  1 tablet Oral Q12H   apixaban  5 mg Oral BID   chlorhexidine  15 mL Mouth Rinse BID   Chlorhexidine Gluconate Cloth  6 each Topical Daily   dapagliflozin propanediol  10 mg Oral Daily   digoxin  0.125 mg Oral Daily   feeding supplement  237 mL Oral TID BM   mouth rinse  15 mL Mouth Rinse q12n4p   mupirocin ointment   Topical BID   pantoprazole  40 mg Oral BID   sodium chloride flush  10-40 mL Intracatheter Q12H   spironolactone  12.5 mg Oral Daily   torsemide  20 mg Oral Daily    Infusions:  sodium chloride 10 mL/hr at 04/10/21 1039    PRN Medications: sodium chloride  flush   Assessment/Plan   1. Gastric perforation with contained leak.  - UGI and CT show contained gastric perf with possible mass. GSU managing. Will need EGD in 4-6 weeks to determine if d/t PUD or malignancy - improving w/ conservative management w/ abx and bowel rest. No indication for emergent surgical intervention at this point.   - to continue abx x 15 day course. On Augmentin (end-date 10/18) - Now tolerating FLD. Management per GSU. - PPI  2. Positive blood cultures  - 10/4 -->BCx - 1/2 sets Blood culture--> Gram + Rods. On Augmentin (suspect contaminant).  - With stomach perforation would avoid TEE   3. Chronic Biventricular Heart Failure  - mixed ischemic/nonischemic cardiomyopathy - recent worsening HF w/ increase in RV pacing s/p recent device upgrade to CRT-D 03/13/21.  - TEE 4/22 EF <20%, RV moderately reduced  - Echo 04/09/21 EF < 20%, RV mildly reduced, RVSP 57 mmHg, mild MR, mild to moderate TR, dilated IVC with estimated RAP 15 mmHg - Overall poor functional status, chronically NYHA Class IIIb at baseline   - DBA stopped 10/08, restarted on 10/10 d/t dropping co-ox. DBA stopped again 10/12. Co-ox stable today at 63%. Long-term, likely would not be a good VAD candidate. Mobility limited (ambulates short distances with a walker with Charcot joints) and has stage III CKD. Also question of gastric malignancy (plans for repeat EGD in 4-6 weeks as above). Not certain home inotrope is option for him with no definite advanced therapy end point.  We discussed this at length.  - CVP low 2. Hold torsemide today.  - GDMT limited by low BP and elevated creatinine.  - Continue spironolactone 12.5 mg daily - Continue digoxin 0.125 mg daily - Continue Farxiga 10 mg daily.   - Follow renal function closely.  4. CAD:  - LHC in 2011 showed large, ectatic coronaries with slow flow and moderate to severe diffuse distal vessel disease (consistent with diabetes) without good interventional  options. Treated medically.  - No chest pain.  - Not on ASA w/ Eliquis use for PAF - hold ? blocker w/ hypotension/shock  - holding statin    5. PAF:  - Maintaining NSR.  - Back on amio and anticoagulated with Eliquis   6. AkI on Stage IV CKD  - Cardiorenal syndrome.  - SCr baseline ~2.5. Now improved with inotropes. - Creatinine 2.9>2.5 >>>1.7> 1.6> 1.76 > 1.55 > 1.67>1.79   7. Nocturnal desaturations - Suspect untreated OSA  Ok for d/c home today from CHF perspective.   Cardiac Meds for Discharge Amiodarone 200 mg daily  Eliquis 5 mg bid Farxiga 10 mg daily  Digoxin 0.125 mg  daily  Spironolactone 12.5 mg daily  Torsemide 20 mg qod (resume 10/15)   We will arrange f/u in the [facility] and will place appt info in [place], PA-C 04/17/2021 7:51 AM  Patient seen and examined with the above-signed Advanced Practice Provider and/or Housestaff. I personally reviewed laboratory data, imaging studies and relevant notes. I independently examined the patient and formulated the important aspects of the plan. I have edited the note to reflect any of my changes or salient points. I have personally discussed the plan with the patient and/or family.  Off milrinone. Feels much better today. CVP low. Denies SOB, orthopnea or PND. No ab pain. Tolerating diet well   General: Sitting up on side of bed No resp difficulty HEENT: normal Neck: supple. no JVD. Carotids 2+ bilat; no bruits. No lymphadenopathy or thryomegaly appreciated. Cor: PMI nondisplaced. Regular rate & rhythm. No rubs, gallops or murmurs. Lungs: clear Abdomen: soft, nontender, nondistended. No hepatosplenomegaly. No bruits or masses. Good bowel sounds. Extremities: no cyanosis, clubbing, rash, edema Neuro: alert & orientedx3, cranial nerves grossly intact. moves all 4 extremities w/o difficulty. Affect pleasant  He is stable for discharge from HF perspective. Meds reviewed and d/w Dr. Doristine Bosworth.  Will arrange  outpatient f/u.   Glori Bickers, MD  5:19 PM

## 2021-04-17 NOTE — Discharge Summary (Signed)
Physician Discharge Summary  Anthony Rubio. LSL:[id number] DOB: December 09, 1950 DOA: 04/08/2021  PCP: Vivi Barrack, MD  Admit date: 04/08/2021 Discharge date: 04/17/2021 30 Day Unplanned Readmission Risk Score    Flowsheet Row ED to Hosp-Admission (Current) from 04/08/2021 in [place] [facility]  30 Day Unplanned Readmission Risk Score (%) 17.77 Filed at 04/17/2021 1200       This score is the patient's risk of an unplanned readmission within 30 days of being discharged (0 -100%). The score is based on dignosis, age, lab data, medications, orders, and past utilization.   Low:  0-14.9   Medium: 15-21.9   High: 22-29.9   Extreme: 30 and above          Admitted From: Home Disposition: Home  Recommendations for Outpatient Follow-up:  Follow up with PCP in 1-2 weeks Please obtain BMP/CBC in one week Follow-up with cardiology/heart failure team in 2 weeks Follow-up with GI/Dr. Paulita Fujita in 2 to 4 weeks Please follow up with your PCP on the following pending results: Unresulted Labs (From admission, onward)     Start     Ordered   04/15/21 0500  CBC  Daily,   R     Question:  Specimen collection method  Answer:  Unit=Unit collect   04/14/21 1110   04/15/21 1157  Basic metabolic panel  Daily,   R     Question:  Specimen collection method  Answer:  Unit=Unit collect   04/14/21 1110   04/11/21 1044  .Cooxemetry Panel (carboxy, met, total hgb, O2 sat)  Daily,   R     Question:  Specimen collection method  Answer:  Unit=Unit collect   04/11/21 1043              Home Health: Yes Equipment/Devices: 3 in 1 commode and home oxygen  Discharge Condition: Stable CODE STATUS: Full code Diet recommendation: Low-sodium  Subjective: Seen and examined.  He has no complaints.  Brief/Interim Summary: Anthony Rubio is a 70 year old male with past medical history of HFrEF with EF <20%, atrial flutter, CAD, AICD, CKD stage III, type 2 diabetes who presented to the ED with  approximately 5 days of persistent vomiting.  started developing diarrhea since arrival to the ED. no fevers, cough, or known ill contacts.   In the ED, he was initially hypotensive which improved marginally with IV fluid.  Labs significant for lactic acid of 2.1, no leukocytosis, creatinine at baseline.    CT abdomen/pelvis concerning for perforated bowel with potential gastric ulcer in the antrum of the stomach. Patient was admitted under PCCM due to persistent hypotension despite receiving 2 L of oxygen and was diagnosed with septic shock.  Started on dobutamine. Upper GI series showed a large gastric ulcer with a largely contained area of perforation, also seen on repeat CT abdomen.  There is also concern of a possible mass at the antrum/pylorus region.   Patient was followed closely by general surgery, it does appear his perforation is contained, and surgery recommended for conservative management especially with significant underlining heart disease where he is high risk, recommendation for p.o. antibiotics and PPI, with outpatient follow-up with GI for endoscopy in 4 weeks. -Patient with cardiogenic shock, where he is in need of pressor support,  his followed closely by CHF team.   Septic shock: Secondary to perforated but contained gastric ulcer.  Resolved. Initially on IV Zosyn, now on Augmentin.  To finish total of 15 days of antibiotic as discussed with  general surgery.  Abd exam is  benign, with no abdominal tenderness or distention. High risk for surgery, continue with conservative management, continue with PPI and antibiotics. Tolerating soft diet. -Will need EGD in several weeks to determine whether etiology is from ulcerative disease versus underlying malignancy, patient is instructed to follow-up in 4-week with Dr. Paulita Fujita regarding that.. -General surgery signed off.  Cardiogenic shock: Patient required dobutamine drip.  This resolved.  Heart failure team was following closely.  They  have cleared him for discharge. -  Chronic Biventricular Heart Failure  - mixed ischemic/nonischemic cardiomyopathy -Echo on 10/5 showing EF less than 20%. - Renal function improving.   -1/4 blood cultures growing Corynebacterium diphtheroids, this is contamination, no indication to treat, discussed with ID.  Cardiology recommended to discharge on Farxiga 10 mg p.o. daily, digoxin 0.125 mg p.o. daily, Aldactone 12.5 mg p.o. daily and torsemide 20 mg daily.   CAD:  -Chest pain, he is on aspirin and Eliquis for A. fib, aspirin remains on hold but Eliquis resumed. - holding statin as NPO   PAF:  -sinus Rhythm, continue Eliquis and amiodarone as well as digoxin.   AKI on stage IV CKD  -Baseline creatinine 2.5, around 3 on admission, creatinine is improving and actually is better than his baseline.   Prolonged QT Monitor Electrolyte and replete, continue with telemetry.   Hypernatremia  Resolved with D5W  Discharge Diagnoses:  Active Problems:   Bowel perforation (HCC)   Sepsis with acute hypoxic respiratory failure ([facility])   Acute pulmonary edema (HCC)    Discharge Instructions   Allergies as of 04/17/2021       Reactions   Erythromycin Other (See Comments)   Toes/finger tingling   Hydralazine Other (See Comments)   Fatigue--felt terrible   Lisinopril Other (See Comments)   Higher doses=lightheadness/dizziness/feeling faint   Losartan Potassium Other (See Comments)   Extreme fatigue        Medication List     STOP taking these medications    carvedilol 6.25 MG tablet Commonly known as: COREG   glipiZIDE 10 MG 24 hr tablet Commonly known as: GLUCOTROL XL   isosorbide mononitrate 30 MG 24 hr tablet Commonly known as: IMDUR   torsemide 20 MG tablet Commonly known as: DEMADEX       TAKE these medications    accu-chek soft touch lancets Use to test blood sugar once a day E11.9 What changed: additional instructions   amiodarone 200 MG tablet Commonly  known as: PACERONE Take 1 tablet (200 mg total) by mouth daily.   amoxicillin-clavulanate 875-125 MG tablet Commonly known as: AUGMENTIN Take 1 tablet by mouth every 12 (twelve) hours for 5 days.   apixaban 5 MG Tabs tablet Commonly known as: ELIQUIS Take 1 tablet (5 mg total) by mouth 2 (two) times daily.   atorvastatin 80 MG tablet Commonly known as: LIPITOR Take 80 mg by mouth daily.   dapagliflozin propanediol 10 MG Tabs tablet Commonly known as: FARXIGA Take 10 mg by mouth daily.   digoxin 0.125 MG tablet Commonly known as: LANOXIN Take 1 tablet (0.125 mg total) by mouth daily. Start taking on: April 18, 2021   ezetimibe 10 MG tablet Commonly known as: Zetia Take 1 tablet (10 mg total) by mouth daily.   pantoprazole 40 MG tablet Commonly known as: PROTONIX Take 1 tablet (40 mg total) by mouth 2 (two) times daily.   spironolactone 25 MG tablet Commonly known as: ALDACTONE Take 0.5 tablets (12.5 mg total) by  mouth daily. Start taking on: April 18, 2021               Durable Medical Equipment  (From admission, onward)           Start     Ordered   04/17/21 1342  For home use only DME oxygen  Once       Question Answer Comment  Length of Need Lifetime   Mode or (Route) Nasal cannula   Liters per Minute 2   Frequency Continuous (stationary and portable oxygen unit needed)   Oxygen delivery system Gas      04/17/21 1342   04/15/21 1529  Heart failure home health orders  (Heart failure home health orders / Face to face)  Once       Comments: Heart Failure Follow-up Care:  Verify follow-up appointments per Patient Discharge Instructions. Confirm transportation arranged. Reconcile home medications with discharge medication list. Remove discontinued medications from use. Assist patient/caregiver to manage medications using pill box. Reinforce low sodium food selection Assessments: Vital signs and oxygen saturation at each visit. Assess home  environment for safety concerns, caregiver support and availability of low-sodium foods. Consult Education officer, museum, PT/OT, Dietitian, and CNA based on assessments. Perform comprehensive cardiopulmonary assessment. Notify MD for any change in condition or weight gain of 3 pounds in one day or 5 pounds in one week with symptoms. Daily Weights and Symptom Monitoring: Ensure patient has access to scales. Teach patient/caregiver to weigh daily before breakfast and after voiding using same scale and record.    Teach patient/caregiver to track weight and symptoms and when to notify Provider. Activity: Develop individualized activity plan with patient/caregiver.   Question Answer Comment  Heart Failure Follow-up Care [facility] at [phone]   Obtain the following labs Basic Metabolic Panel   Lab frequency Weekly   Fax lab results to [facility] at [phone]   Diet Low Sodium Heart Healthy   Fluid restrictions: 1800 mL Fluid      04/15/21 1529            Follow-up Information     Gastroenterology, [place] [place]. Schedule an appointment as soon as possible for a visit.   Why: For upper endoscopy (EGD) in 4-6 weeks. Contact information: [address] [place] [address] [phone]                Allergies  Allergen Reactions   Erythromycin Other (See Comments)    Toes/finger tingling   Hydralazine Other (See Comments)    Fatigue--felt terrible   Lisinopril Other (See Comments)    Higher doses=lightheadness/dizziness/feeling faint   Losartan Potassium Other (See Comments)    Extreme fatigue    Consultations: General surgery, PCCM and heart failure   Procedures/Studies: CT ABDOMEN PELVIS WO CONTRAST  Addendum Date: 04/08/2021   ADDENDUM REPORT: 04/08/2021 06:32 ADDENDUM: As mentioned in the body of the report in the upper pole of the left kidney (axial image 75 of series 3) there is a 2.7 x 2.6 cm low-attenuation lesion which has a focus  of internal calcification, incompletely characterized on today's noncontrast CT examination. This should be further characterized with follow-up nonemergent abdominal MRI with and without IV gadolinium at some point in the near future (but after resolution of the patient's acute illness such that the patient will be adequately able to remain still and hold breath during the MRI examination). Electronically Signed   By: Vinnie Langton M.D.   On: 04/08/2021  06:32   Result Date: 04/08/2021 CLINICAL DATA:  70 year old male with history of chest pain and shortness of breath. Flank pain. Suspected kidney stone. EXAM: CT CHEST, ABDOMEN AND PELVIS WITHOUT CONTRAST TECHNIQUE: Multidetector CT imaging of the chest, abdomen and pelvis was performed following the standard protocol without IV contrast. COMPARISON:  No priors. FINDINGS: CT CHEST FINDINGS Cardiovascular: Heart size is mildly enlarged. There is no significant pericardial fluid, thickening or pericardial calcification. There is aortic atherosclerosis, as well as atherosclerosis of the great vessels of the mediastinum and the coronary arteries, including calcified atherosclerotic plaque in the left main, left anterior descending, left circumflex and right coronary arteries. Severe calcifications of the aortic valve. Left-sided pacemaker/AICD with lead tips terminating in the right atrium, proximal right ventricle and right ventricular apex. Mediastinum/Nodes: No pathologically enlarged mediastinal or hilar lymph nodes. Esophagus is unremarkable in appearance. No axillary lymphadenopathy. Lungs/Pleura: Diffuse interlobular septal thickening is noted throughout the lungs bilaterally, suggesting a background of mild interstitial pulmonary edema. There are dependent areas of atelectasis and airspace consolidation in the lower lobes of the lungs bilaterally. Trace bilateral pleural effusions. Musculoskeletal: There are no aggressive appearing lytic or blastic lesions  noted in the visualized portions of the skeleton. CT ABDOMEN PELVIS FINDINGS Hepatobiliary: Mild diffuse low attenuation throughout the hepatic parenchyma, indicative of a background of mild hepatic steatosis. Unenhanced appearance of the gallbladder is normal. Pancreas: No definite pancreatic mass or peripancreatic fluid collections or inflammatory changes are noted on today's noncontrast CT examination. Spleen: Unremarkable. Adrenals/Urinary Tract: No calcifications are identified within the collecting system of either kidney, along the course of either ureter, or within the lumen of the urinary bladder. In the upper pole of the left kidney (axial image 75 of series 3) there is a 2.7 x 2.6 cm low-attenuation lesion which has a focus of internal calcification, incompletely characterized on today's noncontrast CT examination. 2.2 cm low-attenuation lesion in the posterior aspect of the interpolar region of the left kidney is also not characterized, but statistically likely to represent a cyst. Right kidney and bilateral adrenal glands are normal in appearance. No hydroureteronephrosis. Urinary bladder is normal in appearance. Stomach/Bowel: Along the lesser curvature of the stomach in the region of the antrum (axial image 78 of series 3, coronal image 22 of series 5 and sagittal image 63 of series 6) there is a tiny locule of gas which appears to extend into the gastric wall, which could suggest an ulcer. There is also some fluid adjacent to the greater curvature of the proximal stomach, where there is also a small internal locule of gas which appears to be extraluminal (axial image 62 of series 3 and coronal image 46 of series 5). No pathologic dilatation of small bowel or colon. Normal appendix. Vascular/Lymphatic: Aortic atherosclerosis. No lymphadenopathy noted in the abdomen or pelvis. Reproductive: Prostate gland and seminal vesicles are unremarkable in appearance. Other: Small volume of ascites most evident  adjacent to the liver and spleen. Small amount of pneumoperitoneum. This includes the small amount of gas adjacent to the greater curvature of the stomach, as well as a small amount of gas in the subhepatic regions in the region of the hepatic hilum best appreciated on axial images 62-65 of series 3. Musculoskeletal: There are no aggressive appearing lytic or blastic lesions noted in the visualized portions of the skeleton. IMPRESSION: 1. Findings are highly concerning for perforated bowel. Specifically, the stomach appears abnormal, with potential gastric ulcer in the region of the antrum of the  stomach along the lesser curvature superiorly. There is a small amount of perigastric fluid and trace volume of pneumoperitoneum in the upper abdomen, as detailed above. Correlation with endoscopy is suggested. 2. Dependent areas of atelectasis and consolidation in the lower lobes of the lungs bilaterally, the appearance of which suggests sequela of mild aspiration. 3. Small volume of ascites. 4. Hepatic steatosis. 5. Mild cardiomegaly. The appearance of the lungs suggests mild interstitial pulmonary edema, which may suggest mild congestive heart failure. 6. Aortic atherosclerosis, in addition to left main and 3 vessel coronary artery disease. Assessment for potential risk factor modification, dietary therapy or pharmacologic therapy may be warranted, if clinically indicated. 7. There are calcifications of the aortic valve. Echocardiographic correlation for evaluation of potential valvular dysfunction may be warranted if clinically indicated. 8. Additional incidental findings, as above. Critical Value/emergent results were called by telephone at the time of interpretation on 04/08/2021 at 6:22 am to provider [facility], who verbally acknowledged these results. Electronically Signed: By: Vinnie Langton M.D. On: 04/08/2021 06:27   CT ABDOMEN WO CONTRAST  Addendum Date: 04/09/2021   ADDENDUM REPORT: 04/09/2021 13:29  ADDENDUM: Findings outlined in the initial impression were discussed with the provider as outlined below. In addition to findings related to perforation of gastric ulcer, the marked distortion in thickening of the antrum and pylorus is suggestive of neoplasm which is strongly considered in the working differential at this time based on appearance. Marked distortion from ulcer disease is also considered but follow-up will be important in this patient to exclude underlying neoplasm. These results were called by telephone at the time of interpretation on 04/09/2021 at 1:29 pm to provider Ina Homes , who verbally acknowledged these results. Electronically Signed   By: Zetta Bills M.D.   On: 04/09/2021 13:29   Result Date: 04/09/2021 CLINICAL DATA:  Peritonitis, suspected gastric ulcer perforation in a 70 year old male. EXAM: CT ABDOMEN WITHOUT CONTRAST TECHNIQUE: Multidetector CT imaging of the abdomen was performed following the standard protocol without IV contrast. COMPARISON:  CT of the chest, abdomen and pelvis of April 08, 2021. FINDINGS: Lower chest: There are trace bilateral effusions and there is evidence of basilar atelectasis. Hepatobiliary: Liver with smooth contours. Likely with sludge in the gallbladder lumen. Pancreas: Pancreatic atrophy without signs of focal inflammation. Spleen: Spleen normal size and contour. Adrenals/Urinary Tract: Adrenal glands are normal. Perinephric stranding bilaterally with low-density renal lesions similar to prior imaging. No hydronephrosis. Area of internal calcification in the upper pole lesion on the LEFT is unchanged area measuring approximately 2.3 cm. The low-density lesion in the interpolar LEFT kidney is homogeneous low-density in likely a cyst. Stomach/Bowel: Focal outpouching compatible with gastric ulcer with perforation, largely contained with respect to contrast on the current study in the area of the perforation. There is distortion the gastric antrum  which is similar to the prior CT. Area measuring approximately 16 x 9 mm. Subtle higher density material tracks along the lesser curvature in an oblique fashion seen on images 53 through 59. There is stranding in the lesser sac. Gas in the inter lobar fissure of the liver and fissure for ductus venosus has resolved. However, there is still a small locule of gas in the LEFT upper quadrant. There is no increase in upper abdominal pneumoperitoneum since the previous study. Fluid adjacent to the greater curvature of the stomach at 6.5 x 3.2 cm measures simple fluid density slightly increased when compared to the prior study at 5.5 x 3.1 cm on the  previous exam. Higher density material may track towards this along the posterior wall of the stomach but there is substantial streak artifact due to administered contrast material. Stranding about small bowel loops in the LEFT upper quadrant and colon. Vascular/Lymphatic: Atherosclerotic changes of the abdominal aorta without aneurysmal dilation. Other: Focal fluid along the greater curvature of the stomach with adjacent locules of pneumoperitoneum and extension towards the under surface of the LEFT hemidiaphragm as described. Musculoskeletal: Spinal degenerative changes without acute or destructive bone process. IMPRESSION: Gastric ulcer along the lesser curvature of the gastric antrum with signs of perforation, largely contained to the lesser sac and largely contained to the area along the lesser curvature. However, there is a subtle tract of high density material extending from this area that may represent tiny residual extravasation beyond the area contained perforation/ulceration. Slight increase in fluid in the LEFT upper quadrant along the greater curvature of the stomach with small locules of pneumoperitoneum but with resolution of pneumoperitoneum seen in the intrahepatic fissures on the prior study. Intense inflammation about this area in general as described.  Distortion of the stomach and irregularity of the gastric wall does not allow for exclusion of underlying neoplasm in this location. Trace effusions and basilar airspace disease. Aortic Atherosclerosis (ICD10-I70.0). Electronically Signed: By: Zetta Bills M.D. On: 04/09/2021 13:23   CT Chest Wo Contrast  Addendum Date: 04/08/2021   ADDENDUM REPORT: 04/08/2021 06:32 ADDENDUM: As mentioned in the body of the report in the upper pole of the left kidney (axial image 75 of series 3) there is a 2.7 x 2.6 cm low-attenuation lesion which has a focus of internal calcification, incompletely characterized on today's noncontrast CT examination. This should be further characterized with follow-up nonemergent abdominal MRI with and without IV gadolinium at some point in the near future (but after resolution of the patient's acute illness such that the patient will be adequately able to remain still and hold breath during the MRI examination). Electronically Signed   By: Vinnie Langton M.D.   On: 04/08/2021 06:32   Result Date: 04/08/2021 CLINICAL DATA:  70 year old male with history of chest pain and shortness of breath. Flank pain. Suspected kidney stone. EXAM: CT CHEST, ABDOMEN AND PELVIS WITHOUT CONTRAST TECHNIQUE: Multidetector CT imaging of the chest, abdomen and pelvis was performed following the standard protocol without IV contrast. COMPARISON:  No priors. FINDINGS: CT CHEST FINDINGS Cardiovascular: Heart size is mildly enlarged. There is no significant pericardial fluid, thickening or pericardial calcification. There is aortic atherosclerosis, as well as atherosclerosis of the great vessels of the mediastinum and the coronary arteries, including calcified atherosclerotic plaque in the left main, left anterior descending, left circumflex and right coronary arteries. Severe calcifications of the aortic valve. Left-sided pacemaker/AICD with lead tips terminating in the right atrium, proximal right ventricle and  right ventricular apex. Mediastinum/Nodes: No pathologically enlarged mediastinal or hilar lymph nodes. Esophagus is unremarkable in appearance. No axillary lymphadenopathy. Lungs/Pleura: Diffuse interlobular septal thickening is noted throughout the lungs bilaterally, suggesting a background of mild interstitial pulmonary edema. There are dependent areas of atelectasis and airspace consolidation in the lower lobes of the lungs bilaterally. Trace bilateral pleural effusions. Musculoskeletal: There are no aggressive appearing lytic or blastic lesions noted in the visualized portions of the skeleton. CT ABDOMEN PELVIS FINDINGS Hepatobiliary: Mild diffuse low attenuation throughout the hepatic parenchyma, indicative of a background of mild hepatic steatosis. Unenhanced appearance of the gallbladder is normal. Pancreas: No definite pancreatic mass or peripancreatic fluid collections or inflammatory  changes are noted on today's noncontrast CT examination. Spleen: Unremarkable. Adrenals/Urinary Tract: No calcifications are identified within the collecting system of either kidney, along the course of either ureter, or within the lumen of the urinary bladder. In the upper pole of the left kidney (axial image 75 of series 3) there is a 2.7 x 2.6 cm low-attenuation lesion which has a focus of internal calcification, incompletely characterized on today's noncontrast CT examination. 2.2 cm low-attenuation lesion in the posterior aspect of the interpolar region of the left kidney is also not characterized, but statistically likely to represent a cyst. Right kidney and bilateral adrenal glands are normal in appearance. No hydroureteronephrosis. Urinary bladder is normal in appearance. Stomach/Bowel: Along the lesser curvature of the stomach in the region of the antrum (axial image 78 of series 3, coronal image 22 of series 5 and sagittal image 63 of series 6) there is a tiny locule of gas which appears to extend into the gastric  wall, which could suggest an ulcer. There is also some fluid adjacent to the greater curvature of the proximal stomach, where there is also a small internal locule of gas which appears to be extraluminal (axial image 62 of series 3 and coronal image 46 of series 5). No pathologic dilatation of small bowel or colon. Normal appendix. Vascular/Lymphatic: Aortic atherosclerosis. No lymphadenopathy noted in the abdomen or pelvis. Reproductive: Prostate gland and seminal vesicles are unremarkable in appearance. Other: Small volume of ascites most evident adjacent to the liver and spleen. Small amount of pneumoperitoneum. This includes the small amount of gas adjacent to the greater curvature of the stomach, as well as a small amount of gas in the subhepatic regions in the region of the hepatic hilum best appreciated on axial images 62-65 of series 3. Musculoskeletal: There are no aggressive appearing lytic or blastic lesions noted in the visualized portions of the skeleton. IMPRESSION: 1. Findings are highly concerning for perforated bowel. Specifically, the stomach appears abnormal, with potential gastric ulcer in the region of the antrum of the stomach along the lesser curvature superiorly. There is a small amount of perigastric fluid and trace volume of pneumoperitoneum in the upper abdomen, as detailed above. Correlation with endoscopy is suggested. 2. Dependent areas of atelectasis and consolidation in the lower lobes of the lungs bilaterally, the appearance of which suggests sequela of mild aspiration. 3. Small volume of ascites. 4. Hepatic steatosis. 5. Mild cardiomegaly. The appearance of the lungs suggests mild interstitial pulmonary edema, which may suggest mild congestive heart failure. 6. Aortic atherosclerosis, in addition to left main and 3 vessel coronary artery disease. Assessment for potential risk factor modification, dietary therapy or pharmacologic therapy may be warranted, if clinically indicated. 7.  There are calcifications of the aortic valve. Echocardiographic correlation for evaluation of potential valvular dysfunction may be warranted if clinically indicated. 8. Additional incidental findings, as above. Critical Value/emergent results were called by telephone at the time of interpretation on 04/08/2021 at 6:22 am to provider [facility], who verbally acknowledged these results. Electronically Signed: By: Vinnie Langton M.D. On: 04/08/2021 06:27   DG CHEST PORT 1 VIEW  Result Date: 04/09/2021 CLINICAL DATA:  Central line placement. EXAM: PORTABLE CHEST 1 VIEW COMPARISON:  Chest x-ray 04/08/2021. FINDINGS: Right-sided central venous catheter tip projects over the brachiocephalic SVC junction. Left-sided central venous catheter has been removed in the interval. There is no pneumothorax. Left-sided ICD is unchanged in position. The heart is enlarged. Central pulmonary vascular congestion and interstitial opacities persist.  There is no definite pleural effusion, although costophrenic angles have been excluded from the exam. There is no new focal lung infiltrate. No acute fractures are seen. IMPRESSION: 1. New right-sided central venous catheter tip projects over the brachiocephalic SVC junction. 2. Left-sided central venous catheter has been removed. 3. Stable cardiomegaly with mild interstitial edema. Electronically Signed   By: Ronney Asters M.D.   On: 04/09/2021 16:24   DG Chest Portable 1 View  Result Date: 04/08/2021 CLINICAL DATA:  Vomiting for several days EXAM: PORTABLE CHEST 1 VIEW COMPARISON:  03/13/2021 FINDINGS: Check shadow is enlarged. Defibrillator is again noted and stable. The lungs are well aerated bilaterally with the exception of the left base with left retrocardiac density and small left effusion. IMPRESSION: Left basilar opacity with small effusion. Electronically Signed   By: Inez Catalina M.D.   On: 04/08/2021 03:49   DG UGI W SINGLE CM (SOL OR THIN BA)  Result Date:  04/09/2021 CLINICAL DATA:  A 70 year old male presents with suspected perforated gastric ulcer. EXAM: DG UGI W SINGLE CM TECHNIQUE: Scout radiograph was obtained. Single contrast examination was performed using thin liquid barium. This exam was performed by Luther Parody, and was supervised and interpreted by Zetta Bills. FLUOROSCOPY TIME:  Radiation Exposure Index (as provided by the fluoroscopic device): 54 seconds If the device does not provide the exposure index: Fluoroscopy Time:  19.1 mGy Number of Acquired Images:  5 COMPARISON:  NONE. FINDINGS: Scout Radiograph: Single scout image obtained without increased density in the area of concern along the distal stomach. EKG leads project over the abdomen. Spinal degenerative changes are noted. Esophagus: Esophagus not assessed on the current study which was focused on the area of concern about the stomach. Stomach: Stomach with signs of distortion along the superior antrum. Focal outpouching of contrast demonstrated on the current study at the site of suspected perforation seen on recent CT. Subtle linear area tracking away from this location suggested on lateral projections this did not changed substantially over a series of maneuvers attempting to increase the flow across the area with lateral and oblique positioning. The collection of contrast beyond the gastric lumen measures 11 x 8 mm at maximal dimension. This study was followed with CT imaging as outlined in subsequent CT report. Emptying of the stomach was not impaired and contrast filled the duodenum. IMPRESSION: Large gastric ulcer with largely contained area of perforation along the greater curvature of the stomach with a linear band of contrast that is quite subtle seen on oblique and lateral projections that may extend along the lesser curvature but seen only on one view, see subsequent CT imaging for further detail. Electronically Signed   By: Zetta Bills M.D.   On: 04/09/2021 13:06    ECHOCARDIOGRAM COMPLETE  Result Date: 04/09/2021    ECHOCARDIOGRAM REPORT   Patient Name:   Anthony Rubio. Date of Exam: 04/09/2021 Medical Rec #:  [id number]       Height:       76.0 in Accession #:    [id number]      Weight:       205.7 lb Date of Birth:  1950/08/17       BSA:          2.242 m Patient Age:    25 years        BP:           78/33 mmHg Patient Gender: M  HR:           71 bpm. Exam Location:  Inpatient Procedure: 2D Echo, Color Doppler and Cardiac Doppler Indications:    CHF  History:        Patient has prior history of Echocardiogram examinations, most                 recent 06/21/2020. ICD, CAD, CKD; Risk Factors:Hypertension,                 Current Smoker and Diabetes.  Sonographer:    Maudry Mayhew MHA, RDMS, RVT, RDCS Referring Phys: [id number] SIMMONS  Sonographer Comments: Image acquisition challenging due to respiratory motion and patient movement, talking. IMPRESSIONS  1. Left ventricular ejection fraction, by estimation, is <20%. The left ventricle has severely decreased function. The left ventricle demonstrates global hypokinesis with regional variation. The left ventricular internal cavity size was severely dilated. Left ventricular diastolic parameters are consistent with Grade III diastolic dysfunction (restrictive). Elevated left ventricular end-diastolic pressure. The E/e' is 64.  2. Right ventricular systolic function is mildly reduced. The right ventricular size is mildly enlarged. There is moderately elevated pulmonary artery systolic pressure. The estimated right ventricular systolic pressure is 54.5 mmHg.  3. Left atrial size was severely dilated.  4. The mitral valve is abnormal. Mild mitral valve regurgitation.  5. Tricuspid valve regurgitation is mild to moderate.  6. The aortic valve has an indeterminant number of cusps. There is moderate calcification of the aortic valve. There is moderate thickening of the aortic valve. Aortic valve  regurgitation is not visualized. Mild to moderate aortic valve stenosis. Aortic  valve area, by VTI measures 1.52 cm. Aortic valve mean gradient measures 5.0 mmHg. Aortic valve Vmax measures 1.56 m/s.  7. The inferior vena cava is dilated in size with <50% respiratory variability, suggesting right atrial pressure of 15 mmHg.  8. Right atrial size was moderately dilated. Comparison(s): Changes from prior study are noted. 06/21/2020: LVEF <20%. FINDINGS  Left Ventricle: Left ventricular ejection fraction, by estimation, is <20%. The left ventricle has severely decreased function. The left ventricle demonstrates global hypokinesis. The left ventricular internal cavity size was severely dilated. There is no left ventricular hypertrophy. Left ventricular diastolic parameters are consistent with Grade III diastolic dysfunction (restrictive). Elevated left ventricular end-diastolic pressure. The E/e' is 49. Right Ventricle: The right ventricular size is mildly enlarged. No increase in right ventricular wall thickness. Right ventricular systolic function is mildly reduced. There is moderately elevated pulmonary artery systolic pressure. The tricuspid regurgitant velocity is 3.23 m/s, and with an assumed right atrial pressure of 15 mmHg, the estimated right ventricular systolic pressure is 62.5 mmHg. Left Atrium: Left atrial size was severely dilated. Right Atrium: Right atrial size was moderately dilated. Pericardium: There is no evidence of pericardial effusion. Mitral Valve: The mitral valve is abnormal. There is mild thickening of the mitral valve leaflet(s). Mild mitral valve regurgitation. Tricuspid Valve: The tricuspid valve is grossly normal. Tricuspid valve regurgitation is mild to moderate. Aortic Valve: The aortic valve has an indeterminant number of cusps. There is moderate calcification of the aortic valve. There is moderate thickening of the aortic valve. Aortic valve regurgitation is not visualized. Mild to  moderate aortic stenosis is present. Aortic valve mean gradient measures 5.0 mmHg. Aortic valve peak gradient measures 9.7 mmHg. Aortic valve area, by VTI measures 1.52 cm. Pulmonic Valve: The pulmonic valve was grossly normal. Pulmonic valve regurgitation is trivial. Aorta: The aortic root and ascending aorta  are structurally normal, with no evidence of dilitation. Venous: The inferior vena cava is dilated in size with less than 50% respiratory variability, suggesting right atrial pressure of 15 mmHg. IAS/Shunts: No atrial level shunt detected by color flow Doppler. Additional Comments: A device lead is visualized.  LEFT VENTRICLE PLAX 2D LVIDd:         7.30 cm      Diastology LVIDs:         6.90 cm      LV e' medial:   2.95 cm/s LV PW:         0.80 cm      LV E/e' medial: 32.2 LV IVS:        0.50 cm LVOT diam:     2.60 cm LV SV:         44 LV SV Index:   20 LVOT Area:     5.31 cm  LV Volumes (MOD) LV vol d, MOD A2C: 213.0 ml LV vol d, MOD A4C: 158.0 ml LV vol s, MOD A2C: 166.0 ml LV vol s, MOD A4C: 134.0 ml LV SV MOD A2C:     47.0 ml LV SV MOD A4C:     158.0 ml LV SV MOD BP:      34.4 ml RIGHT VENTRICLE TAPSE (M-mode): 1.7 cm LEFT ATRIUM              Index       RIGHT ATRIUM           Index LA diam:        4.40 cm  1.96 cm/m  RA Area:     26.90 cm LA Vol (A2C):   105.0 ml 46.84 ml/m RA Volume:   89.70 ml  40.01 ml/m LA Vol (A4C):   115.0 ml 51.30 ml/m LA Biplane Vol: 112.0 ml 49.96 ml/m  AORTIC VALVE AV Area (Vmax):    1.52 cm AV Area (Vmean):   1.45 cm AV Area (VTI):     1.52 cm AV Vmax:           155.50 cm/s AV Vmean:          105.350 cm/s AV VTI:            0.288 m AV Peak Grad:      9.7 mmHg AV Mean Grad:      5.0 mmHg LVOT Vmax:         44.40 cm/s LVOT Vmean:        28.800 cm/s LVOT VTI:          0.082 m LVOT/AV VTI ratio: 0.29  AORTA Ao Root diam: 3.60 cm MITRAL VALVE               TRICUSPID VALVE MV Area (PHT): 5.13 cm    TR Peak grad:   41.7 mmHg MV Decel Time: 148 msec    TR Vmax:         323.00 cm/s MR Peak grad: 45.8 mmHg MR Vmax:      338.50 cm/s  SHUNTS MV E velocity: 95.10 cm/s  Systemic VTI:  0.08 m MV A velocity: 28.70 cm/s  Systemic Diam: 2.60 cm MV E/A ratio:  3.31 Lyman Bishop MD Electronically signed by Lyman Bishop MD Signature Date/Time: 04/09/2021/3:43:08 PM    Final    CUP PACEART INCLINIC DEVICE CHECK  Result Date: 03/28/2021 Wound check appointment s/p ICD gen change/upgrade to Bi-V with new LV lead implant. Steri-strips removed. Wound without redness or edema. Incision  edges approximated, wound well healed. Normal device function. Thresholds, sensing, and impedances consistent with implant measurements. Histogram distribution appropriate for patient and level of activity. No mode switches or ventricular arrhythmias noted. Patient educated about wound care, arm mobility, lifting restrictions, shock plan. Patient enrolled in remote monitoring with next transmission scheduled 04/28/21. 91 day follow up with Dr. Lovena Le 06/25/21. Of note patient states he has not felt well post gen change. No abnormalities on device. Patient is enrolled in [facility] clinic and active with  the HF team. States he has felt extremely weak recently. Encouraged patient to check BP at home and discuss with HF team. BP today 100/67.Portia E. Rollene Rotunda, BSN, RN    Discharge Exam: Vitals:   04/17/21 0723 04/17/21 0925  BP: 102/74   Pulse: 70 70  Resp: 17   Temp: 98 F (36.7 C)   SpO2: 93%    Vitals:   04/17/21 0002 04/17/21 0256 04/17/21 0723 04/17/21 0925  BP: 105/78 101/78 102/74   Pulse: 69 69 70 70  Resp: $Remo'20 13 17   'RVwxN$ Temp: 97.9 F (36.6 C) 98 F (36.7 C) 98 F (36.7 C)   TempSrc: Oral Oral    SpO2: 95% 94% 93%   Weight:  89.6 kg    Height:        General: Pt is alert, awake, not in acute distress Cardiovascular: RRR, S1/S2 +, no rubs, no gallops Respiratory: CTA bilaterally, no wheezing, no rhonchi Abdominal: Soft, NT, ND, bowel sounds + Extremities: no edema, no cyanosis    The  results of significant diagnostics from this hospitalization (including imaging, microbiology, ancillary and laboratory) are listed below for reference.     Microbiology: Recent Results (from the past 240 hour(s))  Resp Panel by RT-PCR (Flu A&B, Covid) Nasopharyngeal Swab     Status: None   Collection Time: 04/08/21  3:00 AM   Specimen: Nasopharyngeal Swab; Nasopharyngeal(NP) swabs in vial transport medium  Result Value Ref Range Status   SARS Coronavirus 2 by RT PCR NEGATIVE NEGATIVE Final    Comment: (NOTE) SARS-CoV-2 target nucleic acids are NOT DETECTED.  The SARS-CoV-2 RNA is generally detectable in upper respiratory specimens during the acute phase of infection. The lowest concentration of SARS-CoV-2 viral copies this assay can detect is 138 copies/mL. A negative result does not preclude SARS-Cov-2 infection and should not be used as the sole basis for treatment or other patient management decisions. A negative result may occur with  improper specimen collection/handling, submission of specimen other than nasopharyngeal swab, presence of viral mutation(s) within the areas targeted by this assay, and inadequate number of viral copies(<138 copies/mL). A negative result must be combined with clinical observations, patient history, and epidemiological information. The expected result is Negative.  Fact Sheet for Patients:  EntrepreneurPulse.com.au  Fact Sheet for Healthcare Providers:  IncredibleEmployment.be  This test is no t yet approved or cleared by the [place] FDA and  has been authorized for detection and/or diagnosis of SARS-CoV-2 by FDA under an Emergency Use Authorization (EUA). This EUA will remain  in effect (meaning this test can be used) for the duration of the COVID-19 declaration under Section 564(b)(1) of the Act, 21 U.S.C.section 360bbb-3(b)(1), unless the authorization is terminated  or revoked sooner.        Influenza A by PCR NEGATIVE NEGATIVE Final   Influenza B by PCR NEGATIVE NEGATIVE Final    Comment: (NOTE) The Xpert Xpress SARS-CoV-2/FLU/RSV plus assay is intended as an aid in the diagnosis of influenza  from Nasopharyngeal swab specimens and should not be used as a sole basis for treatment. Nasal washings and aspirates are unacceptable for Xpert Xpress SARS-CoV-2/FLU/RSV testing.  Fact Sheet for Patients: EntrepreneurPulse.com.au  Fact Sheet for Healthcare Providers: IncredibleEmployment.be  This test is not yet approved or cleared by the [place] FDA and has been authorized for detection and/or diagnosis of SARS-CoV-2 by FDA under an Emergency Use Authorization (EUA). This EUA will remain in effect (meaning this test can be used) for the duration of the COVID-19 declaration under Section 564(b)(1) of the Act, 21 U.S.C. section 360bbb-3(b)(1), unless the authorization is terminated or revoked.  Performed at [facility], [place] [address], [place], [place] [address]   Blood Culture (routine x 2)     Status: None   Collection Time: 04/08/21  3:25 AM   Specimen: BLOOD  Result Value Ref Range Status   Specimen Description BLOOD LEFT ANTECUBITAL  Final   Special Requests   Final    BOTTLES DRAWN AEROBIC AND ANAEROBIC Blood Culture adequate volume   Culture   Final    NO GROWTH 5 DAYS Performed at [facility], [place] [address] [place], [place] [address]    Report Status 04/13/2021 FINAL  Final  Blood Culture (routine x 2)     Status: Abnormal   Collection Time: 04/08/21  3:40 AM   Specimen: BLOOD RIGHT HAND  Result Value Ref Range Status   Specimen Description BLOOD RIGHT HAND  Final   Special Requests   Final    BOTTLES DRAWN AEROBIC AND ANAEROBIC Blood Culture adequate volume   Culture  Setup Time   Final    GRAM POSITIVE RODS AEROBIC BOTTLE ONLY CRITICAL RESULT CALLED TO, READ BACK BY AND VERIFIED WITH: PHARMD ALEX L  1512 [id number] FCP    Culture (A)  Final    DIPHTHEROIDS(CORYNEBACTERIUM SPECIES) Standardized susceptibility testing for this organism is not available. Performed at [facility], [place] [address] [place], [place] [address]    Report Status 04/12/2021 FINAL  Final  MRSA Next Gen by PCR, Nasal     Status: None   Collection Time: 04/08/21  8:43 AM   Specimen: Nasal Mucosa; Nasal Swab  Result Value Ref Range Status   MRSA by PCR Next Gen NOT DETECTED NOT DETECTED Final    Comment: (NOTE) The GeneXpert MRSA Assay (FDA approved for NASAL specimens only), is one component of a comprehensive MRSA colonization surveillance program. It is not intended to diagnose MRSA infection nor to guide or monitor treatment for MRSA infections. Test performance is not FDA approved in patients less than 53 years old. Performed at [facility], [place] [address] [place], [place] [address]      Labs: BNP (last 3 results) Recent Labs    09/09/20 0952 04/08/21 0325  BNP 2,121.4* 7,001.7*   Basic Metabolic Panel: Recent Labs  Lab 04/13/21 0215 04/14/21 0443 04/15/21 0410 04/16/21 0335 04/17/21 0353  NA 140 139 140 136 136  K 4.3 4.2 3.8 4.6 4.3  CL 110 111 108 104 101  CO2 $Re'23 22 23 24 27  'bzu$ GLUCOSE 150* 138* 169* 133* 167*  BUN 26* 29* 28* 36* 41*  CREATININE 1.61* 1.76* 1.55* 1.67* 1.79*  CALCIUM 8.0* 7.9* 8.4* 8.5* 8.6*   Liver Function Tests: No results for input(s): AST, ALT, ALKPHOS, BILITOT, PROT, ALBUMIN in the last 168 hours. No results for input(s): LIPASE, AMYLASE in the last 168 hours. No results for input(s):  AMMONIA in the last 168 hours. CBC: Recent Labs  Lab 04/11/21 0520 04/12/21 0500 04/13/21 0215 04/14/21 0443 04/15/21 0410 04/16/21 0335 04/17/21 0353  WBC 5.9 4.6 4.7 5.2 5.5 6.2 6.4  NEUTROABS 4.7 3.2 3.1 3.2  --   --   --   HGB 16.4 15.3 14.9 15.6 15.7 15.0 14.9  HCT 52.6* 49.2 47.6 49.8 49.0 46.7 46.6  MCV 96.9 96.3 96.0 95.4 93.2 93.2 92.6  PLT 93*  101* 101* 110* 120* 159 191   Cardiac Enzymes: No results for input(s): CKTOTAL, CKMB, CKMBINDEX, TROPONINI in the last 168 hours. BNP: Invalid input(s): POCBNP CBG: Recent Labs  Lab 04/11/21 0037 04/11/21 0130 04/11/21 0422 04/17/21 1128  GLUCAP 63* 128* 103* 157*   D-Dimer No results for input(s): DDIMER in the last 72 hours. Hgb A1c No results for input(s): HGBA1C in the last 72 hours. Lipid Profile No results for input(s): CHOL, HDL, LDLCALC, TRIG, CHOLHDL, LDLDIRECT in the last 72 hours. Thyroid function studies No results for input(s): TSH, T4TOTAL, T3FREE, THYROIDAB in the last 72 hours.  Invalid input(s): FREET3 Anemia work up No results for input(s): VITAMINB12, FOLATE, FERRITIN, TIBC, IRON, RETICCTPCT in the last 72 hours. Urinalysis    Component Value Date/Time   BILIRUBINUR negative 01/05/2018 1359   KETONESUR negative 01/05/2018 1359   PROTEINUR trace (A) 01/05/2018 1359   UROBILINOGEN 1.0 01/05/2018 1359   NITRITE Negative 01/05/2018 1359   LEUKOCYTESUR Negative 01/05/2018 1359   Sepsis Labs Invalid input(s): PROCALCITONIN,  WBC,  LACTICIDVEN Microbiology Recent Results (from the past 240 hour(s))  Resp Panel by RT-PCR (Flu A&B, Covid) Nasopharyngeal Swab     Status: None   Collection Time: 04/08/21  3:00 AM   Specimen: Nasopharyngeal Swab; Nasopharyngeal(NP) swabs in vial transport medium  Result Value Ref Range Status   SARS Coronavirus 2 by RT PCR NEGATIVE NEGATIVE Final    Comment: (NOTE) SARS-CoV-2 target nucleic acids are NOT DETECTED.  The SARS-CoV-2 RNA is generally detectable in upper respiratory specimens during the acute phase of infection. The lowest concentration of SARS-CoV-2 viral copies this assay can detect is 138 copies/mL. A negative result does not preclude SARS-Cov-2 infection and should not be used as the sole basis for treatment or other patient management decisions. A negative result may occur with  improper specimen  collection/handling, submission of specimen other than nasopharyngeal swab, presence of viral mutation(s) within the areas targeted by this assay, and inadequate number of viral copies(<138 copies/mL). A negative result must be combined with clinical observations, patient history, and epidemiological information. The expected result is Negative.  Fact Sheet for Patients:  BloggerCourse.com  Fact Sheet for Healthcare Providers:  SeriousBroker.it  This test is no t yet approved or cleared by the [place] FDA and  has been authorized for detection and/or diagnosis of SARS-CoV-2 by FDA under an Emergency Use Authorization (EUA). This EUA will remain  in effect (meaning this test can be used) for the duration of the COVID-19 declaration under Section 564(b)(1) of the Act, 21 U.S.C.section 360bbb-3(b)(1), unless the authorization is terminated  or revoked sooner.       Influenza A by PCR NEGATIVE NEGATIVE Final   Influenza B by PCR NEGATIVE NEGATIVE Final    Comment: (NOTE) The Xpert Xpress SARS-CoV-2/FLU/RSV plus assay is intended as an aid in the diagnosis of influenza from Nasopharyngeal swab specimens and should not be used as a sole basis for treatment. Nasal washings and aspirates are unacceptable for Xpert Xpress SARS-CoV-2/FLU/RSV testing.  Fact Sheet for Patients: EntrepreneurPulse.com.au  Fact Sheet for Healthcare Providers: IncredibleEmployment.be  This test is not yet approved or cleared by the [place] FDA and has been authorized for detection and/or diagnosis of SARS-CoV-2 by FDA under an Emergency Use Authorization (EUA). This EUA will remain in effect (meaning this test can be used) for the duration of the COVID-19 declaration under Section 564(b)(1) of the Act, 21 U.S.C. section 360bbb-3(b)(1), unless the authorization is terminated or revoked.  Performed at [facility], [place] [address] [place], [place] [address]   Blood Culture (routine x 2)     Status: None   Collection Time: 04/08/21  3:25 AM   Specimen: BLOOD  Result Value Ref Range Status   Specimen Description BLOOD LEFT ANTECUBITAL  Final   Special Requests   Final    BOTTLES DRAWN AEROBIC AND ANAEROBIC Blood Culture adequate volume   Culture   Final    NO GROWTH 5 DAYS Performed at [facility], [place] [address], [place], [place] [address]    Report Status 04/13/2021 FINAL  Final  Blood Culture (routine x 2)     Status: Abnormal   Collection Time: 04/08/21  3:40 AM   Specimen: BLOOD RIGHT HAND  Result Value Ref Range Status   Specimen Description BLOOD RIGHT HAND  Final   Special Requests   Final    BOTTLES DRAWN AEROBIC AND ANAEROBIC Blood Culture adequate volume   Culture  Setup Time   Final    GRAM POSITIVE RODS AEROBIC BOTTLE ONLY CRITICAL RESULT CALLED TO, READ BACK BY AND VERIFIED WITH: PHARMD ALEX L 1512 [id number] FCP    Culture (A)  Final    DIPHTHEROIDS(CORYNEBACTERIUM SPECIES) Standardized susceptibility testing for this organism is not available. Performed at [facility], [place] [address] [place], [place] [address]    Report Status 04/12/2021 FINAL  Final  MRSA Next Gen by PCR, Nasal     Status: None   Collection Time: 04/08/21  8:43 AM   Specimen: Nasal Mucosa; Nasal Swab  Result Value Ref Range Status   MRSA by PCR Next Gen NOT DETECTED NOT DETECTED Final    Comment: (NOTE) The GeneXpert MRSA Assay (FDA approved for NASAL specimens only), is one component of a comprehensive MRSA colonization surveillance program. It is not intended to diagnose MRSA infection nor to guide or monitor treatment for MRSA infections. Test performance is not FDA approved in patients less than 32 years old. Performed at [facility], [place] [address], [place], [place] [address]      Time coordinating discharge: Over 30 minutes  SIGNED:   Darliss Cheney,  MD  [facility] 04/17/2021, 2:07 PM  If 7PM-7AM, please contact night-coverage [email or web address]

## 2021-04-17 NOTE — Progress Notes (Deleted)
With frequent short runs of v-tach, asymptomatic. Pt. Claimed he did not feel anything. Continue to monitor.

## 2021-04-18 ENCOUNTER — Telehealth: Payer: Self-pay

## 2021-04-18 NOTE — Telephone Encounter (Cosign Needed)
Transition Care Management Unsuccessful Follow-up Telephone Call  Date of discharge and from where:  [place] 04/17/21  Attempts:  1st Attempt  Reason for unsuccessful TCM follow-up call:  Unable to leave message

## 2021-04-28 ENCOUNTER — Ambulatory Visit (INDEPENDENT_AMBULATORY_CARE_PROVIDER_SITE_OTHER): Payer: Medicare PPO

## 2021-04-28 DIAGNOSIS — Z95810 Presence of automatic (implantable) cardiac defibrillator: Secondary | ICD-10-CM

## 2021-04-28 DIAGNOSIS — I5022 Chronic systolic (congestive) heart failure: Secondary | ICD-10-CM

## 2021-04-29 NOTE — Progress Notes (Signed)
Patient ID: Anthony Rubio., male   DOB: 07-Oct-1950, 70 y.o.   MRN: [id number] PCP: Dr. Jerline Pain Cardiology: Dr. Aundra Dubin  70 y.o. with history of CAD, mixed ischemic/nonischemic cardiomyopathy, and diabetes who presents for followup. He initially was hospitalized in 5/11 with acute decompensated CHF after several days of shortness of breath, orthopnea, and PND. He was diuresed and left/right heart catheterization was done. Left heart cath showed large, ectatic coronaries with slow flow and moderate to severe diffuse distal vessel disease (consistent with diabetes) without good interventional options. EF was 15-20% by echo. He was put on milrinone and further diuresed for several more days. He was titrated off milrinone and discharged on an oral medication regimen. He later developed atrial flutter requiring cardioversion. He remains in NSR.  Patient had Medtronic ICD placed in 11/11.  Repeat echo in 10/12 showed EF 25%.  Most recent echo in 1/19 showed EF 25% with severe LV dilation. He was noted to be in atrial fibrillation again in 2/19.  He had TEE-guided DCCV in 2/19 back to NSR. He has not been able tolerate even 1/2 tab tid of Bidil due to lightheadedness.   He went into atrial fibrillation in 2/22 with increased RV pacing and worsening CHF.  He had DCCV back to NSR in 3/22. However, it appears that a few days after DCCV, he went back into atrial fibrillation.  In 4/22, he had TEE-guided DCCV.  TEE in 4/22 showed EF < 20%, no thrombus, moderately decreased RV systolic function, moderate RV enlargement, moderate TR, functionally bicuspid aortic valve with mild AI (no AS).   ICD upgraded to BiV 9/22.  Admitted 104-10/13 for septic shock secondary to perforated bowel, felt to be from PUD vs gastric cancer. Eliquis held. Initially required NE for BP support. GDMT held and AHF consulted. Concern for cardiogenic shock with Co-Ox 49% and DBA started. Echo showed EF<20%. Perforation contained and general surgery  recommended conservative management due to underlying cardiac comorbidities with GI follow up for possible EGD in a few weeks. Pressors and DBA weaned off and GDMT slowly added back; Imdur and beta blocker held at discharge with low BP. Discharged home with [facility] [place] [facility], weight 197 lbs.  Today he returns for post hospitalization HF follow up with his wife. He is fatigued and has very poor balance due to severe diabetic peripheral neuropathy. He mainly uses his wheelchair, but has a walker for very short distances. He does not have significant dyspnea with this. His appetite is poor, food does not taste good anymore. Denies CP, palpitations, abnormal bleeding, dizziness, edema, or PND/Orthopnea. No fever or chills. Taking all medications. He has not smoked since discharge.  Medtronic device (personally reviewed): Optivol trending up, thoracic impedence down, no recent atrial fibrillation, no VT/VF, >99% v-pacing,  <1 hr daily activity.  ECG (personally reviewed): Bi-V pacing  Labs (9/11): LDL 61, HDL 26, K 4.8, creatinine 1.6  Labs (10/11): K 4.3, creatinine 1.3, BNP 165  Labs (11/11): K 4.9, creatinine 1.6  Labs (12/11): K 4.8, creatinine 1.7  Labs (2/12): LDL 110, HDL 28  Labs (3/12): K 4.1, creatinine 1.7 Labs (6/12): K 4.4, creatinine 1.4, BNP 125 Labs (10/12): K 4.8, creatinine 1.4 Labs (12/12): K 4.4, creatinine 1.5, BNP 20 Labs (3/13): K 5, creatinine 1.5, LDL 43, HDL 31 Labs (8/13): K 4.2, creatinine 1.3, BNP 158 Labs (05/03/14): K 4.6 Creatinine 1.4  Labs (9/16): K 4.4, creatinine 1.5 Labs (5/18): K 4.7, creatinine 1.63, pro-BNP 5249, hgb 14.4 Labs (  12/18): K 4.5, creatinine 1.99, LDL 127, HDL 28, hgb 16.1 Labs (2/19): K 4.7, creatinine 2.46, LDL 78, HDL 26 Labs (11/20): K 4.9, creatinine 1.95 Labs (3/22): K 4.7, creatinine 2.9, BNP 2121 Labs (4/22): myeloma panel negative, K 4.4, creatinine 1.93 Labs (6/22): K 4.7, creatinine 2.15, LDL 55, HDL 31 Labs (10/22): K 4.3, creatinine 1.79,  hgb 14.9  Allergies:  1) ! Erythromycin   Past Medical History:  1. CAD: LHC (5/11) with 50-60% mid LAD, diffuse moderate distal LAD up to 60-70%, small OM2 subtotally occluded, moderate to severe diffuse distal PLOM disease, severe diffuse distal RCA, PDA, and PLV disease. Coronaries were ectatic with slow flow. Patient was started on Plavix given concern for thrombus formation in the ectatic coronaries. He stopped Plavix after starting coumadin with atrial flutter.  2. Chronic systolic CHF: Likely mixed ischemic/nonischemic. Echo (5/11) with EF 15-20%, severe global hypokinesis, mild MR, moderate diastolic dysfunction, mildly decreased RV systolic function. RHC (5/11) with mean RA 14 mmHg, PA 38/26, mean PCWP 25 mmHg, CI 1.8. Repeat echo (8/11) with EF 14-97%, severe diastolic dysfunction, mild MR, RV normal size and systolic function. Per official read, cannot rule out apical clot but it does appear to be only trabeculations. TEE (9/11): EF 15% with global hypokinesis, mild to moderate MR, no LV thrombus and no LAA thrombus.  Echo (10/12): EF 25%.  Echo (1/17) with EF 20-25%, severe LV dilation, mild aortic stenosis with AVA 1.5 cm^2, moderate MR, moderately dilated RV with moderately decreased RV systolic function, PASP 50 mmHg. Medtronic ICD.  - Echo (1/19): EF 25%, severe LV dilation, mild RV dilation with severely decreased systolic function, mild-moderate MR.  - TEE (2/19): EF 25%, moderately dilated LV, mildly dilated RV with moderately decreased systolic function, moderate LAE/RAE, moderate TR, peak RV-RA gradient 56 mmHg, functionally bicuspid aortic valve without aortic stenosis.  - TEE (4/22): EF < 20%, no thrombus, moderately decreased RV systolic function, moderate RV enlargement, moderate TR, functionally bicuspid aortic valve with mild AI (no AS). 3. Type II diabetes 4. HTN  5. Smoker  6. Severe diabetic neuropathy with chronic gait dysfunction  7. History of left foot ulcer with poor  healing  8. Atrial flutter s/p DCCV 9/11  9. Aortic stenosis: Mild by echo in 1/17. 10. Atrial fibrillation: Paroxysmal.  - DCCV to NSR in 2/19.  - DCCV to NSR in 4/22.  11. CKD: Stage 3.  Suspect diabetic nephropathy.  12. Functionally bicuspid aortic valve.   Family History:  Family History of Colon CA 1st degree relative <60 9other relative)  Heart disease (grandparent)  Stroke (other relative)  dm only in secondary relatives   Social History:  Pt lives in [place]. 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    Current Outpatient Medications  Medication Sig Dispense Refill   amiodarone (PACERONE) 200 MG tablet Take 1 tablet (200 mg total) by mouth daily. 90 tablet 3   apixaban (ELIQUIS) 5 MG TABS tablet Take 1 tablet (5 mg total) by mouth 2 (two) times daily. 60 tablet 6   atorvastatin (LIPITOR) 80 MG tablet Take 80 mg by mouth daily.     dapagliflozin propanediol (FARXIGA) 10 MG TABS tablet Take 10 mg by mouth daily.     digoxin (LANOXIN) 0.125 MG tablet Take 1 tablet (0.125 mg total) by mouth daily. 30 tablet  0   ezetimibe (ZETIA) 10 MG tablet Take 1 tablet (10 mg total) by mouth daily. 90 tablet 3   Lancets (ACCU-CHEK SOFT TOUCH) lancets Use to test blood sugar once a day E11.9 100 each 3   pantoprazole (PROTONIX) 40 MG tablet Take 1 tablet (40 mg total) by mouth 2 (two) times daily. 60 tablet 0   spironolactone (ALDACTONE) 25 MG tablet Take 1/2 tablet (12.5 mg total) by mouth daily. 15 tablet 0   No current facility-administered medications for this encounter.   Wt Readings from Last 3 Encounters:  04/30/21 91.1 kg  04/17/21 89.6 kg  03/13/21 94.3 kg   BP 100/60   Pulse 69   Wt 91.1 kg   SpO2 95%   BMI 24.44 kg/m   General:  NAD. No resp difficulty, thin, arrived in [facility] HEENT: Normal Neck: Supple. JVP to jaw. Carotids 2+  bilat; no bruits. No lymphadenopathy or thryomegaly appreciated. Cor: PMI nondisplaced. Regular rate & rhythm. No rubs, gallops or murmurs. Lungs: Clear Abdomen: Soft, nontender, nondistended. No hepatosplenomegaly. No bruits or masses. Good bowel sounds. Extremities: No cyanosis, clubbing, rash, edema Neuro: Alert & oriented x 3, cranial nerves grossly intact. Moves all 4 extremities w/o difficulty. Affect pleasant.  Assessment/Plan: 1. Gastric perforation with contained leak:  - UGI and CT showed contained gastric perf with possible mass.  - Finished course of abx. - Tolerating diet at home. - Continue PPI - His wife says he has follow up with GI to discuss EGD to determine PUD vs malignancy. Will confirm this has been arranged.   2. Chronic Biventricular Heart Failure: Mixed ischemic/nonischemic cardiomyopathy Recent worsening HF w/ increase in RV pacing s/p recent device upgrade to CRT-D 03/13/21.  - TEE 4/22 EF <20%, RV moderately reduced  - Echo 04/09/21 EF < 20%, RV mildly reduced, RVSP 57 mmHg, mild MR, mild to moderate TR, dilated IVC with estimated RAP 15 mmHg - Overall poor functional status, chronically NYHA Class IIIb at baseline   Long-term, likely would not be a good VAD candidate. Mobility limited (ambulates short distances with a walker with Charcot joints) and has stage III-IV CKD. Also question of gastric malignancy (plans for repeat EGD in 4-6 weeks as above). Not certain home inotrope is option for him with no definite advanced therapy end point.  This was discussed this at length with him during his hospitalization. GDMT limited by low BP and elevated creatinine. He appears to be mildly volume up on exam, OptiVol trending up.  - Restart torsemide 20 mg every other day. CMET/BNP today; repeat in 1 week. - Continue spironolactone 12.5 mg daily - Continue digoxin 0.125 mg daily. Check dig level today. - Continue Farxiga 10 mg daily.   - No ARB/ARNi with low BP and CKD. - No  BP room to add back beta blocker.  - Follow renal function closely.   3. CAD: LHC in 2011 showed large, ectatic coronaries with slow flow and moderate to severe diffuse distal vessel disease (consistent with diabetes) without good interventional options. Treated medically.  - No chest pain.  - Not on ASA w/ Eliquis. - No ? blocker w/ hypotension today and recent shock.   4. Atrial fibrillation: Prior atrial flutter in 2011.  He was noted to be in atrial fibrillation in 2/19 and underwent TEE-guided DCCV to NSR.  DCCV again in 3/22 to NSR. TEE-guided DCCV again in 4/22.   He does not tolerate AF well with increased RV pacing percentage and worsening HF.  He is not a good ablation candidate. He maintaining SR today, no recent atrial fibrillation on device interrogation. - Continue Eliquis 5 mg bid. CBC today. - Continue amiodarone 200 mg daily.  LFT/TSH ok 6/22, will need regular eye exam.     5. CKD IV: Suspect diabetic nephropathy. He was previously referred to Nephrology before recent admission. - Baseline SCr 1.6-1.8 - Labs today.   6. Nocturnal desaturations: Suspect untreated OSA. - Consider sleep study in the future if he is agreeable.  7. Tobacco Abuse:  - He has quit smoking since discharge. - Congratulated.  8. GOC: With no good advanced therapy end points, poor functional status, and possibility of gastric CA, I think he would benefit from home health hospice & palliative services. Can discuss with him and his wife at next visit.   Follow up with APP in 3-4 weeks to reassess volume and with Dr. Aundra Dubin in 2 months.  Allena Katz, FNP-BC 04/30/21

## 2021-04-30 ENCOUNTER — Telehealth: Payer: Self-pay

## 2021-04-30 ENCOUNTER — Other Ambulatory Visit: Payer: Self-pay

## 2021-04-30 ENCOUNTER — Ambulatory Visit (HOSPITAL_COMMUNITY)
Admission: RE | Admit: 2021-04-30 | Discharge: 2021-04-30 | Disposition: A | Discharge status: Home / Self Care | Payer: Medicare PPO | Source: Ambulatory Visit | Attending: Family Medicine | Admitting: Family Medicine

## 2021-04-30 ENCOUNTER — Other Ambulatory Visit (HOSPITAL_COMMUNITY): Payer: Self-pay | Admitting: Family Medicine

## 2021-04-30 ENCOUNTER — Encounter (HOSPITAL_COMMUNITY): Payer: Self-pay

## 2021-04-30 VITALS — BP 100/60 | HR 69 | Wt 200.8 lb

## 2021-04-30 DIAGNOSIS — I428 Other cardiomyopathies: Secondary | ICD-10-CM | POA: Insufficient documentation

## 2021-04-30 DIAGNOSIS — Z833 Family history of diabetes mellitus: Secondary | ICD-10-CM | POA: Diagnosis not present

## 2021-04-30 DIAGNOSIS — Z8249 Family history of ischemic heart disease and other diseases of the circulatory system: Secondary | ICD-10-CM | POA: Diagnosis not present

## 2021-04-30 DIAGNOSIS — N184 Chronic kidney disease, stage 4 (severe): Secondary | ICD-10-CM | POA: Diagnosis not present

## 2021-04-30 DIAGNOSIS — Z950 Presence of cardiac pacemaker: Secondary | ICD-10-CM | POA: Insufficient documentation

## 2021-04-30 DIAGNOSIS — N1832 Chronic kidney disease, stage 3b: Secondary | ICD-10-CM

## 2021-04-30 DIAGNOSIS — Z79899 Other long term (current) drug therapy: Secondary | ICD-10-CM | POA: Diagnosis not present

## 2021-04-30 DIAGNOSIS — K255 Chronic or unspecified gastric ulcer with perforation: Secondary | ICD-10-CM | POA: Insufficient documentation

## 2021-04-30 DIAGNOSIS — Z7901 Long term (current) use of anticoagulants: Secondary | ICD-10-CM | POA: Insufficient documentation

## 2021-04-30 DIAGNOSIS — I5022 Chronic systolic (congestive) heart failure: Secondary | ICD-10-CM | POA: Insufficient documentation

## 2021-04-30 DIAGNOSIS — F17200 Nicotine dependence, unspecified, uncomplicated: Secondary | ICD-10-CM

## 2021-04-30 DIAGNOSIS — Z7984 Long term (current) use of oral hypoglycemic drugs: Secondary | ICD-10-CM | POA: Diagnosis not present

## 2021-04-30 DIAGNOSIS — G4734 Idiopathic sleep related nonobstructive alveolar hypoventilation: Secondary | ICD-10-CM

## 2021-04-30 DIAGNOSIS — E1122 Type 2 diabetes mellitus with diabetic chronic kidney disease: Secondary | ICD-10-CM | POA: Insufficient documentation

## 2021-04-30 DIAGNOSIS — Z95810 Presence of automatic (implantable) cardiac defibrillator: Secondary | ICD-10-CM

## 2021-04-30 DIAGNOSIS — Z881 Allergy status to other antibiotic agents status: Secondary | ICD-10-CM | POA: Insufficient documentation

## 2021-04-30 DIAGNOSIS — Z7189 Other specified counseling: Secondary | ICD-10-CM

## 2021-04-30 DIAGNOSIS — I2510 Atherosclerotic heart disease of native coronary artery without angina pectoris: Secondary | ICD-10-CM | POA: Insufficient documentation

## 2021-04-30 DIAGNOSIS — I130 Hypertensive heart and chronic kidney disease with heart failure and stage 1 through stage 4 chronic kidney disease, or unspecified chronic kidney disease: Secondary | ICD-10-CM | POA: Diagnosis not present

## 2021-04-30 DIAGNOSIS — I480 Paroxysmal atrial fibrillation: Secondary | ICD-10-CM | POA: Insufficient documentation

## 2021-04-30 DIAGNOSIS — Z87891 Personal history of nicotine dependence: Secondary | ICD-10-CM | POA: Insufficient documentation

## 2021-04-30 DIAGNOSIS — I5082 Biventricular heart failure: Secondary | ICD-10-CM | POA: Insufficient documentation

## 2021-04-30 LAB — COMPREHENSIVE METABOLIC PANEL
ALT: 22 U/L (ref 0–44)
AST: 19 U/L (ref 15–41)
Albumin: 2.9 g/dL — ABNORMAL LOW (ref 3.5–5.0)
Alkaline Phosphatase: 101 U/L (ref 38–126)
Anion gap: 6 (ref 5–15)
BUN: 28 mg/dL — ABNORMAL HIGH (ref 8–23)
CO2: 23 mmol/L (ref 22–32)
Calcium: 8.5 mg/dL — ABNORMAL LOW (ref 8.9–10.3)
Chloride: 109 mmol/L (ref 98–111)
Creatinine, Ser: 2.02 mg/dL — ABNORMAL HIGH (ref 0.61–1.24)
GFR, Estimated: 35 mL/min — ABNORMAL LOW (ref >60)
Glucose, Bld: 129 mg/dL — ABNORMAL HIGH (ref 70–99)
Potassium: 4.8 mmol/L (ref 3.5–5.1)
Sodium: 138 mmol/L (ref 135–145)
Total Bilirubin: 1.5 mg/dL — ABNORMAL HIGH (ref 0.3–1.2)
Total Protein: 6.3 g/dL — ABNORMAL LOW (ref 6.5–8.1)

## 2021-04-30 LAB — CBC
HCT: 49.8 % (ref 39.0–52.0)
Hemoglobin: 15.6 g/dL (ref 13.0–17.0)
MCH: 29.3 pg (ref 26.0–34.0)
MCHC: 31.3 g/dL (ref 30.0–36.0)
MCV: 93.4 fL (ref 80.0–100.0)
Platelets: 194 10*3/uL (ref 150–400)
RBC: 5.33 MIL/uL (ref 4.22–5.81)
RDW: 15.9 % — ABNORMAL HIGH (ref 11.5–15.5)
WBC: 5.4 10*3/uL (ref 4.0–10.5)
nRBC: 0 % (ref 0.0–0.2)

## 2021-04-30 LAB — DIGOXIN LEVEL: Digoxin Level: 1.2 ng/mL (ref 0.8–2.0)

## 2021-04-30 MED ORDER — DIGOXIN 125 MCG PO TABS
0.0625 mg | ORAL_TABLET | Freq: Every day | ORAL | 0 refills | Status: DC
Start: 2021-04-30 — End: 2021-05-15

## 2021-04-30 MED ORDER — TORSEMIDE 20 MG PO TABS: 20.0000 mg | ORAL_TABLET | ORAL | 3 refills | Status: DC

## 2021-04-30 NOTE — Telephone Encounter (Signed)
OK to give VO?

## 2021-04-30 NOTE — Progress Notes (Signed)
EPIC Encounter for ICM Monitoring  Patient Name: Anthony Rubio. is a 70 y.o. male Date: 04/30/2021 Primary Care Physican: Vivi Barrack, MD Primary Cardiologist: Aundra Dubin Electrophysiologist: Allred 04/30/2021 Weight: 206 lbs   Time in AT/AF  0.0 hr/day (0.0%)         Spoke with patient and reports hospitalization on 10/4 - 10/13 for bowel perforation per chart review.  Per Discharge orders Torsemie was stopped but pt has continued taking and he will discuss at HF clinic appt today.   Optivol thoracic impedance suggesting fluid levels close to normal.   Prescribed:  Spironolactone 25 mg take 1 tablet by mouth daily.    Labs: 04/17/2021 Creatinine 1.79, BUN 41, Potassium 4.3, Sodium 136, GFR 40 04/16/2021 Creatinine 1.67, BUN 36, Potassium 4.6, Sodium 136, GFR 43  04/15/2021 Creatinine 1.55, BUN 28, Potassium 3.8, Sodium 140, GFR 48  04/14/2021 Creatinine 1.76, BUN 29, Potassium 4.2, Sodium 139, GFR 41  04/13/2021 Creatinine 1.61, BUN 26, Potassium 4.3, Sodium 140, GFR 45  04/12/2021 Creatinine 1.70, BUN 30, Potassium 3.3, Sodium 142, GFR 43  A complete set of results can be found in Results Review.   Recommendations:   No changes and encouraged to call if experiencing any fluid symptoms.   Follow-up plan: ICM clinic phone appointment on 06/09/2021.   91 day device clinic remote transmission 05/05/2021.      EP/Cardiology Office Visits:  04/30/2021 with [facility] PA/NP.     Copy of ICM check sent to Dr. Rayann Heman.    3 month ICM trend: 04/28/2021.    1 Year ICM trend:       [name], RN 04/30/2021 1:58 PM

## 2021-04-30 NOTE — Telephone Encounter (Signed)
He needs a face to face visit for insurance purposes.  Anthony Rubio. Jerline Pain, MD 04/30/2021 10:38 AM

## 2021-04-30 NOTE — Telephone Encounter (Signed)
Flor from [facility] called needing orders approved for physical therapy. Orland Mustard would like a call back at [phone]. Please Advise.

## 2021-04-30 NOTE — Patient Instructions (Signed)
RESTART Torsemide 20 mg, one tab every other day  Labs today We will only contact you if something comes back abnormal or we need to make some changes. Otherwise no news is good news!  Labs needed in 7-10 days  Your physician recommends that you schedule a follow-up appointment in: 3 weeks  in the [facility] Practition[facility] (PA/NP) Clinic and in 3 months with Dr Aundra Dubin  Do the following things EVERYDAY: Weigh yourself in the morning before breakfast. Write it down and keep it in a log. Take your medicines as prescribed Eat low salt foods--Limit salt (sodium) to 2000 mg per day.  Stay as active as you can everyday Limit all fluids for the day to less than 2 liters  At the [facility], you and your health needs are our priority. As part of our continuing mission to provide you with exceptional heart care, we have created designated Provider Care Teams. These Care Teams include your primary Cardiologist (physician) and Advanced Practice Providers (APPs- Physician Assistants and Nurse Practitioners) who all work together to provide you with the care you need, when you need it.   You may see any of the following providers on your designated Care Team at your next follow up: Dr Glori Bickers Dr Haynes Kerns, NP Lyda Jester, [place] [facility] [place], [place] Audry Riles, PharmD   Please be sure to bring in all your medications bottles to every appointment.   If you have any questions or concerns before your next appointment please send [place] a message through [place] or call our office at [phone].    TO LEAVE A MESSAGE FOR THE NURSE SELECT OPTION 2, PLEASE LEAVE A MESSAGE INCLUDING: YOUR NAME DATE OF BIRTH CALL BACK NUMBER REASON FOR CALL**this is important as we prioritize the call backs  YOU WILL RECEIVE A CALL BACK THE SAME DAY AS LONG AS YOU CALL BEFORE 4:00 PM

## 2021-05-01 ENCOUNTER — Other Ambulatory Visit (HOSPITAL_COMMUNITY): Payer: Self-pay

## 2021-05-01 NOTE — Telephone Encounter (Signed)
Pt is scheduled for 6 month f/u on 11/28. Can pt wait until then or does pt need an appt prior? Please Advise.

## 2021-05-02 ENCOUNTER — Telehealth (HOSPITAL_COMMUNITY): Payer: Self-pay | Admitting: Licensed Clinical Social Worker

## 2021-05-02 ENCOUNTER — Telehealth (HOSPITAL_COMMUNITY): Payer: Self-pay | Admitting: *Deleted

## 2021-05-02 NOTE — Telephone Encounter (Signed)
Pts wife left vm asking for a call back to confirm digoxin dose.  Called Nellie at [phone] as requested and left detailed vm.

## 2021-05-02 NOTE — Telephone Encounter (Signed)
[facility] called to get verbal orders for physical therapy. I informed them that Dr Jerline Pain will not give orders until Anthony Rubio is seen. I called Anthony Rubio to get scheduled and he no longer wants to come to Dr Jerline Pain as PCP. He stated that he will be going to the [place].

## 2021-05-02 NOTE — Telephone Encounter (Signed)
CSW consulted to reach out to pt regarding recent bill for ICD procedure informed provider while he was at appt that it was $120,000  CSW spoke with pt who confirmed that was how much the procedure was but that insurance did pay for it and was only about $1 out of pocket for him so no concerns with paying bills at this time.  CSW will continue to follow through clinic and assist as needed  Jorge Ny, [facility] Desk#: [phone] Cell#: [phone]

## 2021-05-05 ENCOUNTER — Ambulatory Visit (INDEPENDENT_AMBULATORY_CARE_PROVIDER_SITE_OTHER): Payer: Medicare PPO

## 2021-05-05 DIAGNOSIS — I429 Cardiomyopathy, unspecified: Secondary | ICD-10-CM

## 2021-05-05 LAB — CUP PACEART REMOTE DEVICE CHECK
Battery Remaining Longevity: 88 mo
Battery Voltage: 3.06 V
Brady Statistic AP VP Percent: 96.67 %
Brady Statistic AP VS Percent: 0.05 %
Brady Statistic AS VP Percent: 3.28 %
Brady Statistic AS VS Percent: 0 %
Brady Statistic RA Percent Paced: 96.33 %
Brady Statistic RV Percent Paced: 99.72 %
Date Time Interrogation Session: 20221031133727
HighPow Impedance: 38 Ohm
HighPow Impedance: 47 Ohm
Implantable Lead Implant Date: 20111108
Implantable Lead Implant Date: 20111108
Implantable Lead Implant Date: 20220908
Implantable Lead Location: 753858
Implantable Lead Location: 753859
Implantable Lead Location: 753860
Implantable Lead Model: 3830
Implantable Lead Model: 5076
Implantable Lead Model: 6947
Implantable Pulse Generator Implant Date: 20220908
Lead Channel Impedance Value: 266 Ohm
Lead Channel Impedance Value: 266 Ohm
Lead Channel Impedance Value: 342 Ohm
Lead Channel Impedance Value: 342 Ohm
Lead Channel Impedance Value: 399 Ohm
Lead Channel Impedance Value: 532 Ohm
Lead Channel Pacing Threshold Amplitude: 0.625 V
Lead Channel Pacing Threshold Amplitude: 0.75 V
Lead Channel Pacing Threshold Amplitude: 1 V
Lead Channel Pacing Threshold Pulse Width: 0.4 ms
Lead Channel Pacing Threshold Pulse Width: 0.4 ms
Lead Channel Pacing Threshold Pulse Width: 0.4 ms
Lead Channel Sensing Intrinsic Amplitude: 2.375 mV
Lead Channel Sensing Intrinsic Amplitude: 2.375 mV
Lead Channel Sensing Intrinsic Amplitude: 3.5 mV
Lead Channel Sensing Intrinsic Amplitude: 3.5 mV
Lead Channel Setting Pacing Amplitude: 1.25 V
Lead Channel Setting Pacing Amplitude: 1.5 V
Lead Channel Setting Pacing Amplitude: 2 V
Lead Channel Setting Pacing Pulse Width: 0.4 ms
Lead Channel Setting Pacing Pulse Width: 0.4 ms
Lead Channel Setting Sensing Sensitivity: 0.3 mV

## 2021-05-08 ENCOUNTER — Other Ambulatory Visit: Payer: Self-pay

## 2021-05-08 ENCOUNTER — Ambulatory Visit (HOSPITAL_COMMUNITY)
Admission: RE | Admit: 2021-05-08 | Discharge: 2021-05-08 | Disposition: A | Discharge status: Home / Self Care | Payer: Medicare PPO | Source: Ambulatory Visit | Attending: Cardiology | Admitting: Cardiology

## 2021-05-08 ENCOUNTER — Telehealth (HOSPITAL_COMMUNITY): Payer: Self-pay

## 2021-05-08 DIAGNOSIS — I5022 Chronic systolic (congestive) heart failure: Secondary | ICD-10-CM | POA: Diagnosis present

## 2021-05-08 LAB — DIGOXIN LEVEL: Digoxin Level: 0.9 ng/mL (ref 0.8–2.0)

## 2021-05-08 NOTE — Telephone Encounter (Signed)
-----   Message from Rafael Bihari, [place] sent at 05/08/2021  1:37 PM EDT ----- Dig level improved, but remains mildly elevated. Please have him repeat dig level (trough) early next week but needs to hold dig before labs

## 2021-05-08 NOTE — Telephone Encounter (Signed)
Pt will come in on Monday to repeat dig level. Pt advised to not take dig before test. Verbalized understanding.

## 2021-05-12 ENCOUNTER — Ambulatory Visit (HOSPITAL_COMMUNITY)
Admission: RE | Admit: 2021-05-12 | Discharge: 2021-05-12 | Disposition: A | Discharge status: Home / Self Care | Payer: Medicare PPO | Source: Ambulatory Visit | Attending: Cardiology | Admitting: Cardiology

## 2021-05-12 ENCOUNTER — Other Ambulatory Visit: Payer: Self-pay

## 2021-05-12 DIAGNOSIS — I5022 Chronic systolic (congestive) heart failure: Secondary | ICD-10-CM | POA: Diagnosis present

## 2021-05-12 LAB — DIGOXIN LEVEL: Digoxin Level: 0.6 ng/mL — ABNORMAL LOW (ref 0.8–2.0)

## 2021-05-13 ENCOUNTER — Encounter (HOSPITAL_COMMUNITY): Payer: Self-pay | Admitting: *Deleted

## 2021-05-13 NOTE — Progress Notes (Signed)
Remote ICD transmission.   

## 2021-05-15 ENCOUNTER — Telehealth (HOSPITAL_COMMUNITY): Payer: Self-pay | Admitting: *Deleted

## 2021-05-15 ENCOUNTER — Encounter (HOSPITAL_COMMUNITY): Payer: Self-pay | Admitting: *Deleted

## 2021-05-15 MED ORDER — DIGOXIN 125 MCG PO TABS: 0.0625 mg | ORAL_TABLET | ORAL | 0 refills | Status: AC

## 2021-05-15 NOTE — Telephone Encounter (Signed)
Dental clearance emailed to [email or web address]  Per Dr.Mc[place] decrease dig to 0.0625mg  every other day

## 2021-05-20 ENCOUNTER — Encounter: Payer: Medicare Other | Admitting: Internal Medicine

## 2021-05-22 ENCOUNTER — Other Ambulatory Visit (HOSPITAL_COMMUNITY): Payer: Self-pay | Admitting: *Deleted

## 2021-05-22 MED ORDER — PANTOPRAZOLE SODIUM 40 MG PO TBEC: 40.0000 mg | DELAYED_RELEASE_TABLET | Freq: Two times a day (BID) | ORAL | 3 refills | Status: AC

## 2021-05-23 NOTE — Progress Notes (Addendum)
Patient ID: Anthony Rubio., male   DOB: Jan 08, 1951, 70 y.o.   MRN: [id number] PCP: Dr. Jerline Pain Cardiology: Dr. Aundra Dubin  70 y.o. with history of CAD, mixed ischemic/nonischemic cardiomyopathy, and diabetes who presents for followup. He initially was hospitalized in 5/11 with acute decompensated CHF after several days of shortness of breath, orthopnea, and PND. He was diuresed and left/right heart catheterization was done. Left heart cath showed large, ectatic coronaries with slow flow and moderate to severe diffuse distal vessel disease (consistent with diabetes) without good interventional options. EF was 15-20% by echo. He was put on milrinone and further diuresed for several more days. He was titrated off milrinone and discharged on an oral medication regimen. He later developed atrial flutter requiring cardioversion. He remains in NSR.  Patient had Medtronic ICD placed in 11/11.  Repeat echo in 10/12 showed EF 25%.  Most recent echo in 1/19 showed EF 25% with severe LV dilation. He was noted to be in atrial fibrillation again in 2/19.  He had TEE-guided DCCV in 2/19 back to NSR. He has not been able tolerate even 1/2 tab tid of Bidil due to lightheadedness.   He went into atrial fibrillation in 2/22 with increased RV pacing and worsening CHF.  He had DCCV back to NSR in 3/22. However, it appears that a few days after DCCV, he went back into atrial fibrillation.  In 4/22, he had TEE-guided DCCV.  TEE in 4/22 showed EF < 20%, no thrombus, moderately decreased RV systolic function, moderate RV enlargement, moderate TR, functionally bicuspid aortic valve with mild AI (no AS).   ICD upgraded to BiV 9/22.  Admitted 104-10/13 for septic shock secondary to perforated bowel, felt to be from PUD vs gastric cancer. Eliquis held. Initially required NE for BP support. GDMT held and AHF consulted. Concern for cardiogenic shock with Co-Ox 49% and DBA started. Echo showed EF<20%. Perforation contained and general surgery  recommended conservative management due to underlying cardiac comorbidities with GI follow up for possible EGD in a few weeks. Pressors and DBA weaned off and GDMT slowly added back; Imdur and beta blocker held at discharge with low BP. Discharged home with [facility] [facility], weight 197 lbs.  Today he returns for HF follow up with his wife. He tells me he feels well and is trying to get his strength back. He has dyspnea with minimal activity and is mostly wheelchair bound due to severe diabetic peripheral neuropathy, wife says his appetite is poor.  He has a new wound on right ankle.  Denies CP, dizziness, edema, or PND/Orthopnea. Appetite fair. No fever or chills. Weight at home 190 lbs. Taking all medications. Has appt with Nephrology w/ the [place] in [place]. He has not smoked since discharge.  Medtronic device (personally reviewed): Optivol remains elevated, thoracic impedence down, no AF, no VT/VF, >99% v-pacing,  <1 hr daily activity.  ECG (personally reviewed): none ordered today.  Labs (9/11): LDL 61, HDL 26, K 4.8, creatinine 1.6  Labs (10/11): K 4.3, creatinine 1.3, BNP 165  Labs (11/11): K 4.9, creatinine 1.6  Labs (12/11): K 4.8, creatinine 1.7  Labs (2/12): LDL 110, HDL 28  Labs (3/12): K 4.1, creatinine 1.7 Labs (6/12): K 4.4, creatinine 1.4, BNP 125 Labs (10/12): K 4.8, creatinine 1.4 Labs (12/12): K 4.4, creatinine 1.5, BNP 20 Labs (3/13): K 5, creatinine 1.5, LDL 43, HDL 31 Labs (8/13): K 4.2, creatinine 1.3, BNP 158 Labs (05/03/14): K 4.6 Creatinine 1.4  Labs (9/16): K 4.4, creatinine  1.5 Labs (5/18): K 4.7, creatinine 1.63, pro-BNP 5249, hgb 14.4 Labs (12/18): K 4.5, creatinine 1.99, LDL 127, HDL 28, hgb 16.1 Labs (2/19): K 4.7, creatinine 2.46, LDL 78, HDL 26 Labs (11/20): K 4.9, creatinine 1.95 Labs (3/22): K 4.7, creatinine 2.9, BNP 2121 Labs (4/22): myeloma panel negative, K 4.4, creatinine 1.93 Labs (6/22): K 4.7, creatinine 2.15, LDL 55, HDL 31 Labs (10/22): K 4.3,  creatinine 1.79, hgb 14.9  Allergies:  1) ! Erythromycin   Past Medical History:  1. CAD: LHC (5/11) with 50-60% mid LAD, diffuse moderate distal LAD up to 60-70%, small OM2 subtotally occluded, moderate to severe diffuse distal PLOM disease, severe diffuse distal RCA, PDA, and PLV disease. Coronaries were ectatic with slow flow. Patient was started on Plavix given concern for thrombus formation in the ectatic coronaries. He stopped Plavix after starting coumadin with atrial flutter.  2. Chronic systolic CHF: Likely mixed ischemic/nonischemic. Echo (5/11) with EF 15-20%, severe global hypokinesis, mild MR, moderate diastolic dysfunction, mildly decreased RV systolic function. RHC (5/11) with mean RA 14 mmHg, PA 38/26, mean PCWP 25 mmHg, CI 1.8. Repeat echo (8/11) with EF 19-37%, severe diastolic dysfunction, mild MR, RV normal size and systolic function. Per official read, cannot rule out apical clot but it does appear to be only trabeculations. TEE (9/11): EF 15% with global hypokinesis, mild to moderate MR, no LV thrombus and no LAA thrombus.  Echo (10/12): EF 25%.  Echo (1/17) with EF 20-25%, severe LV dilation, mild aortic stenosis with AVA 1.5 cm^2, moderate MR, moderately dilated RV with moderately decreased RV systolic function, PASP 50 mmHg. Medtronic ICD.  - Echo (1/19): EF 25%, severe LV dilation, mild RV dilation with severely decreased systolic function, mild-moderate MR.  - TEE (2/19): EF 25%, moderately dilated LV, mildly dilated RV with moderately decreased systolic function, moderate LAE/RAE, moderate TR, peak RV-RA gradient 56 mmHg, functionally bicuspid aortic valve without aortic stenosis.  - TEE (4/22): EF < 20%, no thrombus, moderately decreased RV systolic function, moderate RV enlargement, moderate TR, functionally bicuspid aortic valve with mild AI (no AS). 3. Type II diabetes 4. HTN  5. Smoker  6. Severe diabetic neuropathy with chronic gait dysfunction  7. History of left  foot ulcer with poor healing  8. Atrial flutter s/p DCCV 9/11  9. Aortic stenosis: Mild by echo in 1/17. 10. Atrial fibrillation: Paroxysmal.  - DCCV to NSR in 2/19.  - DCCV to NSR in 4/22.  11. CKD: Stage 3.  Suspect diabetic nephropathy.  12. Functionally bicuspid aortic valve.   Family History:  Family History of Colon CA 1st degree relative <60 9other relative)  Heart disease (grandparent)  Stroke (other relative)  dm only in secondary relatives   Social History:  Pt lives in [place]. 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    Current Outpatient Medications  Medication Sig Dispense Refill   amiodarone (PACERONE) 200 MG tablet Take 1 tablet (200 mg total) by mouth daily. 90 tablet 3   apixaban (ELIQUIS) 5 MG TABS tablet Take 1 tablet (5 mg total) by mouth 2 (two) times daily. 60 tablet 6   atorvastatin (LIPITOR) 80 MG tablet Take 80 mg by mouth daily.     dapagliflozin propanediol (FARXIGA) 10 MG TABS tablet Take 10 mg by mouth daily.     digoxin (LANOXIN) 0.125 MG  tablet Take 0.5 tablets (0.0625 mg total) by mouth every other day. 7.5 tablet 0   ezetimibe (ZETIA) 10 MG tablet Take 1 tablet (10 mg total) by mouth daily. 90 tablet 3   glipiZIDE (GLUCOTROL) 5 MG tablet Take by mouth daily before breakfast.     Lancets (ACCU-CHEK SOFT TOUCH) lancets Use to test blood sugar once a day E11.9 100 each 3   pantoprazole (PROTONIX) 40 MG tablet Take 1 tablet (40 mg total) by mouth 2 (two) times daily. 60 tablet 3   spironolactone (ALDACTONE) 25 MG tablet Take 1/2 tablet (12.5 mg total) by mouth daily. 15 tablet 0   torsemide (DEMADEX) 20 MG tablet Take 1 tablet (20 mg total) by mouth every other day. 15 tablet 3   No current facility-administered medications for this encounter.   Wt Readings from Last 3 Encounters:  05/26/21 87.9 kg  04/30/21  91.1 kg  04/17/21 89.6 kg   BP 118/78   Pulse 72   Wt 87.9 kg   SpO2 91%   BMI 23.59 kg/m   General:  NAD. Mildly SOB speaking full sentences, thin, arrived in [facility], chronically-ill appearing HEENT: Normal Neck: Supple. JVP to jaw. Carotids 2+ bilat; no bruits. No lymphadenopathy or thryomegaly appreciated. Cor: PMI nondisplaced. Regular rate & rhythm. No rubs, gallops or murmurs. Lungs: Diminished in bases. Abdomen: Soft, nontender, nondistended. No hepatosplenomegaly. No bruits or masses. Good bowel sounds. Extremities: No cyanosis, clubbing, rash, edema; tender quarter-sized, circumscribed abrasion w/ erythema + clear drainage to right ankle Neuro: Alert & oriented x 3, cranial nerves grossly intact. Moves all 4 extremities w/o difficulty. Affect pleasant.  Assessment/Plan: 1. Chronic Biventricular Heart Failure: Mixed ischemic/nonischemic cardiomyopathy Recent worsening HF w/ increase in RV pacing s/p recent device upgrade to CRT-D 03/13/21.  - TEE 4/22 EF <20%, RV moderately reduced  - Echo 04/09/21 EF < 20%, RV mildly reduced, RVSP 57 mmHg, mild MR, mild to moderate TR, dilated IVC with estimated RAP 15 mmHg - Overall poor functional status, chronically NYHA Class IIIb at baseline   Long-term, likely would not be a good VAD candidate. Mobility limited (ambulates short distances with a walker with Charcot joints) and has stage III-IV CKD. Also question of gastric malignancy (plans for repeat EGD in 4-6 weeks as above). Not certain home inotrope is option for him with no definite advanced therapy end point.  This was discussed this at length with him during his hospitalization. GDMT limited by low BP and elevated creatinine. He appears to be mildly volume up on exam, OptiVol trending up.  - Increase torsemide to 20 mg daily.  BMET today, repeat in 10 days. - Continue spironolactone 12.5 mg daily. - Continue digoxin 0.0625 mg daily. Check dig level today. - Continue Farxiga 10 mg daily.    - No ARB/ARNi with low BP and CKD. - No BP room to add back beta blocker.  - Follow renal function closely.   2. CAD: LHC in 2011 showed large, ectatic coronaries with slow flow and moderate to severe diffuse distal vessel disease (consistent with diabetes) without good interventional options. Treated medically.  - No chest pain.  - Not on ASA w/ Eliquis. - No ? blocker w/ hypotension today and recent shock.   3. Atrial fibrillation: Prior atrial flutter in 2011.  He was noted to be in atrial fibrillation in 2/19 and underwent TEE-guided DCCV to NSR.  DCCV again in 3/22 to NSR. TEE-guided DCCV again in 4/22.   He does not tolerate  AF well with increased RV pacing percentage and worsening HF.  He is not a good ablation candidate. He is regular on exam today, no recent atrial fibrillation on device interrogation. - Continue Eliquis 5 mg bid. No bleeding issues - Continue amiodarone 200 mg daily.  LFT/TSH ok 6/22, will need regular eye exam.    4. Gastric perforation with contained leak:  - UGI and CT showed contained gastric perf with possible mass.  - Tolerating diet at home. - Continue PPI. - His wife says he has follow up with GI to discuss EGD to determine PUD vs malignancy. Will confirm this has been arranged.   5. CKD IV: Suspect diabetic nephropathy. He was previously referred to Nephrology before recent admission. - Baseline SCr 2-2.5. BMET today. - He has follow up at the [facility] w/ Nephrology soon.   6. Nocturnal desaturations: Suspect untreated OSA. - Consider sleep study. Will discuss at next visit.  7. Tobacco Abuse:  - He has quit smoking since discharge. - Congratulated.  8. Wound, right ankle: - Looks like a stage II. - Advised that he needs to be referred to Wound Clinic due to history of Charcot joints, DM2 & previous non-healing ulcers, but he declines this today. - Instructed to wash with warm water + soap, pat dry, OK to use Aquaphor and cover with bandaid daily. -  Start Keflex 250 mg QID x 5 days. - Needs to be followed by PCP or wound care. Wife aware.  9. GOC: With no good advanced therapy end points, poor functional status, and possibility of gastric CA, I think he would benefit from hospice & palliative care services.  - Attempted to discuss this today, however Anthony Rubio is not interested in further discussions concerning his prognosis at this time. He wants to limit the number of provider visits, lab draws, and medication changes. His wife tells me he is feeling out of control due to his declining health. - Offered home health services, patient and wife declined at this time. - Will re-attempt discussions at next visit  Greater than 50% of the (total minutes 35) visit spent in counseling/coordination of care regarding (discussion of declining functional status, medication changes, monitoring of labs, and goals of care).   Follow up with APP in 3-4 weeks to reassess volume/wound and with Dr. Aundra Dubin in 2 months.  Allena Katz, FNP-BC 05/26/21

## 2021-05-26 ENCOUNTER — Other Ambulatory Visit: Payer: Self-pay

## 2021-05-26 ENCOUNTER — Ambulatory Visit (HOSPITAL_COMMUNITY)
Admission: RE | Admit: 2021-05-26 | Discharge: 2021-05-26 | Disposition: A | Discharge status: Home / Self Care | Payer: Medicare PPO | Source: Ambulatory Visit | Attending: Family Medicine | Admitting: Family Medicine

## 2021-05-26 ENCOUNTER — Encounter (HOSPITAL_COMMUNITY): Payer: Self-pay

## 2021-05-26 VITALS — BP 118/78 | HR 72 | Wt 193.8 lb

## 2021-05-26 DIAGNOSIS — E1122 Type 2 diabetes mellitus with diabetic chronic kidney disease: Secondary | ICD-10-CM | POA: Insufficient documentation

## 2021-05-26 DIAGNOSIS — I130 Hypertensive heart and chronic kidney disease with heart failure and stage 1 through stage 4 chronic kidney disease, or unspecified chronic kidney disease: Secondary | ICD-10-CM | POA: Insufficient documentation

## 2021-05-26 DIAGNOSIS — Z993 Dependence on wheelchair: Secondary | ICD-10-CM | POA: Insufficient documentation

## 2021-05-26 DIAGNOSIS — Z79899 Other long term (current) drug therapy: Secondary | ICD-10-CM | POA: Diagnosis not present

## 2021-05-26 DIAGNOSIS — I428 Other cardiomyopathies: Secondary | ICD-10-CM | POA: Insufficient documentation

## 2021-05-26 DIAGNOSIS — Z7189 Other specified counseling: Secondary | ICD-10-CM

## 2021-05-26 DIAGNOSIS — E1140 Type 2 diabetes mellitus with diabetic neuropathy, unspecified: Secondary | ICD-10-CM | POA: Diagnosis not present

## 2021-05-26 DIAGNOSIS — R7989 Other specified abnormal findings of blood chemistry: Secondary | ICD-10-CM | POA: Diagnosis not present

## 2021-05-26 DIAGNOSIS — G4734 Idiopathic sleep related nonobstructive alveolar hypoventilation: Secondary | ICD-10-CM

## 2021-05-26 DIAGNOSIS — R262 Difficulty in walking, not elsewhere classified: Secondary | ICD-10-CM | POA: Diagnosis not present

## 2021-05-26 DIAGNOSIS — I5082 Biventricular heart failure: Secondary | ICD-10-CM | POA: Insufficient documentation

## 2021-05-26 DIAGNOSIS — Z95810 Presence of automatic (implantable) cardiac defibrillator: Secondary | ICD-10-CM | POA: Diagnosis not present

## 2021-05-26 DIAGNOSIS — Z87891 Personal history of nicotine dependence: Secondary | ICD-10-CM | POA: Insufficient documentation

## 2021-05-26 DIAGNOSIS — I5022 Chronic systolic (congestive) heart failure: Secondary | ICD-10-CM | POA: Insufficient documentation

## 2021-05-26 DIAGNOSIS — K255 Chronic or unspecified gastric ulcer with perforation: Secondary | ICD-10-CM | POA: Diagnosis not present

## 2021-05-26 DIAGNOSIS — Z7984 Long term (current) use of oral hypoglycemic drugs: Secondary | ICD-10-CM | POA: Diagnosis not present

## 2021-05-26 DIAGNOSIS — Z7901 Long term (current) use of anticoagulants: Secondary | ICD-10-CM | POA: Insufficient documentation

## 2021-05-26 DIAGNOSIS — N184 Chronic kidney disease, stage 4 (severe): Secondary | ICD-10-CM | POA: Insufficient documentation

## 2021-05-26 DIAGNOSIS — X58XXXA Exposure to other specified factors, initial encounter: Secondary | ICD-10-CM | POA: Diagnosis not present

## 2021-05-26 DIAGNOSIS — I2510 Atherosclerotic heart disease of native coronary artery without angina pectoris: Secondary | ICD-10-CM | POA: Insufficient documentation

## 2021-05-26 DIAGNOSIS — I480 Paroxysmal atrial fibrillation: Secondary | ICD-10-CM | POA: Insufficient documentation

## 2021-05-26 DIAGNOSIS — S91001A Unspecified open wound, right ankle, initial encounter: Secondary | ICD-10-CM | POA: Insufficient documentation

## 2021-05-26 DIAGNOSIS — I959 Hypotension, unspecified: Secondary | ICD-10-CM | POA: Diagnosis not present

## 2021-05-26 LAB — BASIC METABOLIC PANEL
Anion gap: 10 (ref 5–15)
BUN: 32 mg/dL — ABNORMAL HIGH (ref 8–23)
CO2: 24 mmol/L (ref 22–32)
Calcium: 8.3 mg/dL — ABNORMAL LOW (ref 8.9–10.3)
Chloride: 101 mmol/L (ref 98–111)
Creatinine, Ser: 2.34 mg/dL — ABNORMAL HIGH (ref 0.61–1.24)
GFR, Estimated: 29 mL/min — ABNORMAL LOW (ref >60)
Glucose, Bld: 167 mg/dL — ABNORMAL HIGH (ref 70–99)
Potassium: 4.6 mmol/L (ref 3.5–5.1)
Sodium: 135 mmol/L (ref 135–145)

## 2021-05-26 LAB — DIGOXIN LEVEL: Digoxin Level: 0.5 ng/mL — ABNORMAL LOW (ref 0.8–2.0)

## 2021-05-26 MED ORDER — TORSEMIDE 20 MG PO TABS: 20.0000 mg | ORAL_TABLET | Freq: Every day | ORAL | 3 refills | Status: DC

## 2021-05-26 MED ORDER — CEPHALEXIN 250 MG PO CAPS: 250.0000 mg | ORAL_CAPSULE | Freq: Four times a day (QID) | ORAL | 0 refills | Status: DC

## 2021-05-26 NOTE — Patient Instructions (Addendum)
Labs were done today, if any labs are abnormal the clinic will call you  START Keflex 250 mg 1 tablet 4 times daily for 5 days only   INCREASE Torsemide to 20 mg 1 tablet daily   Your physician recommends that you schedule a follow-up appointment in: 4 weeks  At the [facility], you and your health needs are our priority. As part of our continuing mission to provide you with exceptional heart care, we have created designated Provider Care Teams. These Care Teams include your primary Cardiologist (physician) and Advanced Practice Providers (APPs- Physician Assistants and Nurse Practitioners) who all work together to provide you with the care you need, when you need it.   You may see any of the following providers on your designated Care Team at your next follow up: Dr Glori Bickers Dr Haynes Kerns, NP Lyda Jester, [place] [facility] [place], [place] Audry Riles, PharmD   Please be sure to bring in all your medications bottles to every appointment.   If you have any questions or concerns before your next appointment please send [place] a message through [place] or call our office at [phone].    TO LEAVE A MESSAGE FOR THE NURSE SELECT OPTION 2, PLEASE LEAVE A MESSAGE INCLUDING: YOUR NAME DATE OF BIRTH CALL BACK NUMBER REASON FOR CALL**this is important as we prioritize the call backs  YOU WILL RECEIVE A CALL BACK THE SAME DAY AS LONG AS YOU CALL BEFORE 4:00 PM

## 2021-05-26 NOTE — Addendum Note (Signed)
Encounter addended by: Rafael Bihari, FNP on: 05/26/2021 9:04 PM  Actions taken: Clinical Note Signed

## 2021-05-28 ENCOUNTER — Other Ambulatory Visit: Payer: Self-pay | Admitting: Gastroenterology

## 2021-06-02 ENCOUNTER — Ambulatory Visit: Payer: Medicare Other | Admitting: Family Medicine

## 2021-06-05 ENCOUNTER — Ambulatory Visit (HOSPITAL_COMMUNITY)
Admission: RE | Admit: 2021-06-05 | Discharge: 2021-06-05 | Disposition: A | Discharge status: Home / Self Care | Payer: Medicare PPO | Source: Ambulatory Visit | Attending: Family Medicine | Admitting: Family Medicine

## 2021-06-05 DIAGNOSIS — I5022 Chronic systolic (congestive) heart failure: Secondary | ICD-10-CM | POA: Insufficient documentation

## 2021-06-05 LAB — BASIC METABOLIC PANEL
Anion gap: 11 (ref 5–15)
BUN: 35 mg/dL — ABNORMAL HIGH (ref 8–23)
CO2: 22 mmol/L (ref 22–32)
Calcium: 8.5 mg/dL — ABNORMAL LOW (ref 8.9–10.3)
Chloride: 104 mmol/L (ref 98–111)
Creatinine, Ser: 2.23 mg/dL — ABNORMAL HIGH (ref 0.61–1.24)
GFR, Estimated: 31 mL/min — ABNORMAL LOW (ref >60)
Glucose, Bld: 171 mg/dL — ABNORMAL HIGH (ref 70–99)
Potassium: 4.9 mmol/L (ref 3.5–5.1)
Sodium: 137 mmol/L (ref 135–145)

## 2021-06-05 NOTE — Progress Notes (Signed)
During patients lab appt, pt requested a refill of abx, reports his follow up with PCP is 12/23. Reports his wounds are not healed

## 2021-06-05 NOTE — Addendum Note (Signed)
Encounter addended by: Kerry Dory, CMA on: 06/05/2021 2:23 PM  Actions taken: Clinical Note Signed

## 2021-06-09 ENCOUNTER — Ambulatory Visit (INDEPENDENT_AMBULATORY_CARE_PROVIDER_SITE_OTHER): Payer: Medicare PPO

## 2021-06-09 DIAGNOSIS — Z95810 Presence of automatic (implantable) cardiac defibrillator: Secondary | ICD-10-CM | POA: Diagnosis not present

## 2021-06-09 DIAGNOSIS — I5022 Chronic systolic (congestive) heart failure: Secondary | ICD-10-CM | POA: Diagnosis not present

## 2021-06-09 NOTE — Progress Notes (Signed)
EPIC Encounter for ICM Monitoring  Patient Name: Anthony Rubio. is a 70 y.o. male Date: 06/09/2021 Primary Care Physican: Vivi Barrack, MD Primary Cardiologist: Aundra Dubin Electrophysiologist: Allred 05/26/2021 Office Weight: 193 lbs   Time in AT/AF  0.0 hr/day (0.0%)         Spoke with patient and heart failure questions reviewed.  Pt asymptomatic for fluid accumulation.  He appetite has improved since October hospital discharge and has been eating [employer] chicken noodle soup 1-2 cans daily.  The soup has bout 800-900 mg salt per can.   Optivol thoracic impedance suggesting  possible fluid accumulation starting 11/18.    Prescribed:  Torsemide 20 mg take 1 tablet daily  Spironolactone 25 mg take 1 tablet by mouth daily.    Labs: 06/05/2021 Creatinine 2.23, BUN 35, Potassium 4.9, Sodium 137, GFR 31 05/26/2021 Creatinine 2.34, BUN 32, Potassium 4.6, Sodium 135, GFR 29  04/30/2021 Creatinine 2.02, BUN 28, Potassium 4.8, Sodium 138, GFR 35  04/17/2021 Creatinine 1.79, BUN 41, Potassium 4.3, Sodium 136, GFR 40 04/16/2021 Creatinine 1.67, BUN 36, Potassium 4.6, Sodium 136, GFR 43  04/15/2021 Creatinine 1.55, BUN 28, Potassium 3.8, Sodium 140, GFR 48  04/14/2021 Creatinine 1.76, BUN 29, Potassium 4.2, Sodium 139, GFR 41  04/13/2021 Creatinine 1.61, BUN 26, Potassium 4.3, Sodium 140, GFR 45  04/12/2021 Creatinine 1.70, BUN 30, Potassium 3.3, Sodium 142, GFR 43  A complete set of results can be found in Results Review.   Recommendations:  He will limit salt intake and stop eating soup.  He thinks he can adjust his diet to resolve fluid accumulation.   Follow-up plan: ICM clinic phone appointment on 06/12/2021 (manual) to recheck fluid levels.   91 day device clinic remote transmission 08/04/2021.      EP/Cardiology Office Visits:  06/24/2021 with [facility] PA/NP.     Copy of ICM check sent to Dr. Rayann Heman.    3 month ICM trend: 06/09/2021.    12-14 Month ICM trend:        [name], RN 06/09/2021 12:23 PM

## 2021-06-12 ENCOUNTER — Ambulatory Visit (INDEPENDENT_AMBULATORY_CARE_PROVIDER_SITE_OTHER): Payer: No Typology Code available for payment source

## 2021-06-12 DIAGNOSIS — I5022 Chronic systolic (congestive) heart failure: Secondary | ICD-10-CM

## 2021-06-12 DIAGNOSIS — Z95810 Presence of automatic (implantable) cardiac defibrillator: Secondary | ICD-10-CM

## 2021-06-13 NOTE — Progress Notes (Signed)
EPIC Encounter for ICM Monitoring  Patient Name: Anthony Rubio. is a 70 y.o. male Date: 06/13/2021 Primary Care Physican: Vivi Barrack, MD Primary Cardiologist: Aundra Dubin Electrophysiologist: Allred 05/26/2021 Office Weight: 193 lbs   Time in AT/AF  0.0 hr/day (0.0%)         Spoke with patient and heart failure questions reviewed.  Pt asymptomatic for fluid accumulation.     Optivol thoracic impedance suggesting fluid levels returned to normal after limiting salt intake.    Prescribed:  Torsemide 20 mg take 1 tablet daily  Spironolactone 25 mg take 1 tablet by mouth daily.    Labs: 06/05/2021 Creatinine 2.23, BUN 35, Potassium 4.9, Sodium 137, GFR 31 05/26/2021 Creatinine 2.34, BUN 32, Potassium 4.6, Sodium 135, GFR 29  04/30/2021 Creatinine 2.02, BUN 28, Potassium 4.8, Sodium 138, GFR 35  04/17/2021 Creatinine 1.79, BUN 41, Potassium 4.3, Sodium 136, GFR 40 04/16/2021 Creatinine 1.67, BUN 36, Potassium 4.6, Sodium 136, GFR 43  04/15/2021 Creatinine 1.55, BUN 28, Potassium 3.8, Sodium 140, GFR 48  04/14/2021 Creatinine 1.76, BUN 29, Potassium 4.2, Sodium 139, GFR 41  04/13/2021 Creatinine 1.61, BUN 26, Potassium 4.3, Sodium 140, GFR 45  04/12/2021 Creatinine 1.70, BUN 30, Potassium 3.3, Sodium 142, GFR 43  A complete set of results can be found in Results Review.   Recommendations:  Advised to continue to limit salt intake and call if experiencing any fluid symptoms.    Follow-up plan: ICM clinic phone appointment on 07/14/2021.   91 day device clinic remote transmission 08/04/2021.      EP/Cardiology Office Visits:  06/24/2021 with [facility] PA/NP.     Copy of ICM check sent to Dr. Rayann Heman.    3 month ICM trend: 06/12/2021.    12-14 Month ICM trend:       [name], RN 06/13/2021 10:34 AM

## 2021-06-23 NOTE — Progress Notes (Incomplete)
Patient ID: Anthony Rubio., male   DOB: 1951-03-29, 70 y.o.   MRN: [id number] PCP: Dr. Jerline Pain Cardiology: Dr. Aundra Dubin  70 y.o. with history of CAD, mixed ischemic/nonischemic cardiomyopathy, and diabetes who presents for followup. He initially was hospitalized in 5/11 with acute decompensated CHF after several days of shortness of breath, orthopnea, and PND. He was diuresed and left/right heart catheterization was done. Left heart cath showed large, ectatic coronaries with slow flow and moderate to severe diffuse distal vessel disease (consistent with diabetes) without good interventional options. EF was 15-20% by echo. He was put on milrinone and further diuresed for several more days. He was titrated off milrinone and discharged on an oral medication regimen. He later developed atrial flutter requiring cardioversion. He remains in NSR.  Patient had Medtronic ICD placed in 11/11.  Repeat echo in 10/12 showed EF 25%.  Most recent echo in 1/19 showed EF 25% with severe LV dilation. He was noted to be in atrial fibrillation again in 2/19.  He had TEE-guided DCCV in 2/19 back to NSR. He has not been able tolerate even 1/2 tab tid of Bidil due to lightheadedness.   He went into atrial fibrillation in 2/22 with increased RV pacing and worsening CHF.  He had DCCV back to NSR in 3/22. However, it appears that a few days after DCCV, he went back into atrial fibrillation.  In 4/22, he had TEE-guided DCCV.  TEE in 4/22 showed EF < 20%, no thrombus, moderately decreased RV systolic function, moderate RV enlargement, moderate TR, functionally bicuspid aortic valve with mild AI (no AS).   ICD upgraded to BiV 9/22.  Admitted 104-10/13 for septic shock secondary to perforated bowel, felt to be from PUD vs gastric cancer. Eliquis held. Initially required NE for BP support. GDMT held and AHF consulted. Concern for cardiogenic shock with Co-Ox 49% and DBA started. Echo showed EF<20%. Perforation contained and general surgery  recommended conservative management due to underlying cardiac comorbidities with GI follow up for possible EGD in a few weeks. Pressors and DBA weaned off and GDMT slowly added back; Imdur and beta blocker held at discharge with low BP. Discharged home with [facility], weight 197 lbs.  Today he returns for HF follow up with his wife. He tells me he feels well and is trying to get his strength back. He has dyspnea with minimal activity and is mostly wheelchair bound due to severe diabetic peripheral neuropathy, wife says his appetite is poor.  He has a new wound on right ankle.  Denies CP, dizziness, edema, or PND/Orthopnea. Appetite fair. No fever or chills. Weight at home 190 lbs. Taking all medications. Has appt with Nephrology w/ the [facility] in [place]. He has not smoked since discharge.  Medtronic device (personally reviewed): Optivol remains elevated, thoracic impedence down, no AF, no VT/VF, >99% v-pacing,  <1 hr daily activity.  ECG (personally reviewed): none ordered today.  Labs (9/11): LDL 61, HDL 26, K 4.8, creatinine 1.6  Labs (10/11): K 4.3, creatinine 1.3, BNP 165  Labs (11/11): K 4.9, creatinine 1.6  Labs (12/11): K 4.8, creatinine 1.7  Labs (2/12): LDL 110, HDL 28  Labs (3/12): K 4.1, creatinine 1.7 Labs (6/12): K 4.4, creatinine 1.4, BNP 125 Labs (10/12): K 4.8, creatinine 1.4 Labs (12/12): K 4.4, creatinine 1.5, BNP 20 Labs (3/13): K 5, creatinine 1.5, LDL 43, HDL 31 Labs (8/13): K 4.2, creatinine 1.3, BNP 158 Labs (05/03/14): K 4.6 Creatinine 1.4  Labs (9/16): K 4.4, creatinine  1.5 Labs (5/18): K 4.7, creatinine 1.63, pro-BNP 5249, hgb 14.4 Labs (12/18): K 4.5, creatinine 1.99, LDL 127, HDL 28, hgb 16.1 Labs (2/19): K 4.7, creatinine 2.46, LDL 78, HDL 26 Labs (11/20): K 4.9, creatinine 1.95 Labs (3/22): K 4.7, creatinine 2.9, BNP 2121 Labs (4/22): myeloma panel negative, K 4.4, creatinine 1.93 Labs (6/22): K 4.7, creatinine 2.15, LDL 55, HDL 31 Labs (10/22): K 4.3,  creatinine 1.79, hgb 14.9  Allergies:  1) ! Erythromycin   Past Medical History:  1. CAD: LHC (5/11) with 50-60% mid LAD, diffuse moderate distal LAD up to 60-70%, small OM2 subtotally occluded, moderate to severe diffuse distal PLOM disease, severe diffuse distal RCA, PDA, and PLV disease. Coronaries were ectatic with slow flow. Patient was started on Plavix given concern for thrombus formation in the ectatic coronaries. He stopped Plavix after starting coumadin with atrial flutter.  2. Chronic systolic CHF: Likely mixed ischemic/nonischemic. Echo (5/11) with EF 15-20%, severe global hypokinesis, mild MR, moderate diastolic dysfunction, mildly decreased RV systolic function. RHC (5/11) with mean RA 14 mmHg, PA 38/26, mean PCWP 25 mmHg, CI 1.8. Repeat echo (8/11) with EF 50-27%, severe diastolic dysfunction, mild MR, RV normal size and systolic function. Per official read, cannot rule out apical clot but it does appear to be only trabeculations. TEE (9/11): EF 15% with global hypokinesis, mild to moderate MR, no LV thrombus and no LAA thrombus.  Echo (10/12): EF 25%.  Echo (1/17) with EF 20-25%, severe LV dilation, mild aortic stenosis with AVA 1.5 cm^2, moderate MR, moderately dilated RV with moderately decreased RV systolic function, PASP 50 mmHg. Medtronic ICD.  - Echo (1/19): EF 25%, severe LV dilation, mild RV dilation with severely decreased systolic function, mild-moderate MR.  - TEE (2/19): EF 25%, moderately dilated LV, mildly dilated RV with moderately decreased systolic function, moderate LAE/RAE, moderate TR, peak RV-RA gradient 56 mmHg, functionally bicuspid aortic valve without aortic stenosis.  - TEE (4/22): EF < 20%, no thrombus, moderately decreased RV systolic function, moderate RV enlargement, moderate TR, functionally bicuspid aortic valve with mild AI (no AS). 3. Type II diabetes 4. HTN  5. Smoker  6. Severe diabetic neuropathy with chronic gait dysfunction  7. History of left  foot ulcer with poor healing  8. Atrial flutter s/p DCCV 9/11  9. Aortic stenosis: Mild by echo in 1/17. 10. Atrial fibrillation: Paroxysmal.  - DCCV to NSR in 2/19.  - DCCV to NSR in 4/22.  11. CKD: Stage 3.  Suspect diabetic nephropathy.  12. Functionally bicuspid aortic valve.   Family History:  Family History of Colon CA 1st degree relative <60 9other relative)  Heart disease (grandparent)  Stroke (other relative)  dm only in secondary relatives   Social History:  Pt lives in [place]. 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    Current Outpatient Medications  Medication Sig Dispense Refill   amiodarone (PACERONE) 200 MG tablet Take 1 tablet (200 mg total) by mouth daily. 90 tablet 3   apixaban (ELIQUIS) 5 MG TABS tablet Take 1 tablet (5 mg total) by mouth 2 (two) times daily. 60 tablet 6   atorvastatin (LIPITOR) 80 MG tablet Take 80 mg by mouth daily.     cephALEXin (KEFLEX) 250 MG capsule Take 1 capsule (250 mg total) by mouth 4 (four) times daily. 20 capsule 0  dapagliflozin propanediol (FARXIGA) 10 MG TABS tablet Take 10 mg by mouth daily.     digoxin (LANOXIN) 0.125 MG tablet Take 0.5 tablets (0.0625 mg total) by mouth every other day. 7.5 tablet 0   ezetimibe (ZETIA) 10 MG tablet Take 1 tablet (10 mg total) by mouth daily. 90 tablet 3   glipiZIDE (GLUCOTROL) 5 MG tablet Take by mouth daily before breakfast.     Lancets (ACCU-CHEK SOFT TOUCH) lancets Use to test blood sugar once a day E11.9 100 each 3   pantoprazole (PROTONIX) 40 MG tablet Take 1 tablet (40 mg total) by mouth 2 (two) times daily. 60 tablet 3   spironolactone (ALDACTONE) 25 MG tablet Take 1/2 tablet (12.5 mg total) by mouth daily. 15 tablet 0   torsemide (DEMADEX) 20 MG tablet Take 1 tablet (20 mg total) by mouth daily. 30 tablet 3   No current facility-administered  medications for this visit.   Wt Readings from Last 3 Encounters:  05/26/21 87.9 kg (193 lb 12.8 oz)  04/30/21 91.1 kg (200 lb 12.8 oz)  04/17/21 89.6 kg (197 lb 8.5 oz)   There were no vitals taken for this visit.  General:  NAD. Mildly SOB speaking full sentences, thin, arrived in [facility], chronically-ill appearing HEENT: Normal Neck: Supple. JVP to jaw. Carotids 2+ bilat; no bruits. No lymphadenopathy or thryomegaly appreciated. Cor: PMI nondisplaced. Regular rate & rhythm. No rubs, gallops or murmurs. Lungs: Diminished in bases. Abdomen: Soft, nontender, nondistended. No hepatosplenomegaly. No bruits or masses. Good bowel sounds. Extremities: No cyanosis, clubbing, rash, edema; tender quarter-sized, circumscribed abrasion w/ erythema + clear drainage to right ankle Neuro: Alert & oriented x 3, cranial nerves grossly intact. Moves all 4 extremities w/o difficulty. Affect pleasant.  Assessment/Plan: 1. Chronic Biventricular Heart Failure: Mixed ischemic/nonischemic cardiomyopathy Recent worsening HF w/ increase in RV pacing s/p recent device upgrade to CRT-D 03/13/21.  - TEE 4/22 EF <20%, RV moderately reduced  - Echo 04/09/21 EF < 20%, RV mildly reduced, RVSP 57 mmHg, mild MR, mild to moderate TR, dilated IVC with estimated RAP 15 mmHg - Overall poor functional status, chronically NYHA Class IIIb at baseline   Long-term, likely would not be a good VAD candidate. Mobility limited (ambulates short distances with a walker with Charcot joints) and has stage III-IV CKD. Also question of gastric malignancy (plans for repeat EGD in 4-6 weeks as above). Not certain home inotrope is option for him with no definite advanced therapy end point.  This was discussed this at length with him during his hospitalization. GDMT limited by low BP and elevated creatinine. He appears to be mildly volume up on exam, OptiVol trending up.  - Increase torsemide to 20 mg daily.  BMET today, repeat in 10 days. - Continue  spironolactone 12.5 mg daily. - Continue digoxin 0.0625 mg daily. Check dig level today. - Continue Farxiga 10 mg daily.   - No ARB/ARNi with low BP and CKD. - No BP room to add back beta blocker.  - Follow renal function closely.   2. CAD: LHC in 2011 showed large, ectatic coronaries with slow flow and moderate to severe diffuse distal vessel disease (consistent with diabetes) without good interventional options. Treated medically.  - No chest pain.  - Not on ASA w/ Eliquis. - No ? blocker w/ hypotension today and recent shock.   3. Atrial fibrillation: Prior atrial flutter in 2011.  He was noted to be in atrial fibrillation in 2/19 and underwent TEE-guided DCCV to NSR.  DCCV again in 3/22 to NSR. TEE-guided DCCV again in 4/22.   He does not tolerate AF well with increased RV pacing percentage and worsening HF.  He is not a good ablation candidate. He is regular on exam today, no recent atrial fibrillation on device interrogation. - Continue Eliquis 5 mg bid. No bleeding issues - Continue amiodarone 200 mg daily.  LFT/TSH ok 6/22, will need regular eye exam.    4. Gastric perforation with contained leak:  - UGI and CT showed contained gastric perf with possible mass.  - Tolerating diet at home. - Continue PPI. - His wife says he has follow up with GI to discuss EGD to determine PUD vs malignancy. Will confirm this has been arranged.   5. CKD IV: Suspect diabetic nephropathy. He was previously referred to Nephrology before recent admission. - Baseline SCr 2-2.5. BMET today. - He has follow up at the [facility] w/ Nephrology soon.   6. Nocturnal desaturations: Suspect untreated OSA. - Consider sleep study. Will discuss at next visit.  7. Tobacco Abuse:  - He has quit smoking since discharge. - Congratulated.  8. Wound, right ankle: - Looks like a stage II. - Advised that he needs to be referred to Wound Clinic due to history of Charcot joints, DM2 & previous non-healing ulcers, but he  declines this today. - Instructed to wash with warm water + soap, pat dry, OK to use Aquaphor and cover with bandaid daily. - Start Keflex 250 mg QID x 5 days. - Needs to be followed by PCP or wound care. Wife aware.  9. GOC: With no good advanced therapy end points, poor functional status, and possibility of gastric CA, I think he would benefit from hospice & palliative care services.  - Attempted to discuss this today, however Roye is not interested in further discussions concerning his prognosis at this time. He wants to limit the number of provider visits, lab draws, and medication changes. His wife tells me he is feeling out of control due to his declining health. - Offered home health services, patient and wife declined at this time. - Will re-attempt discussions at next visit  Greater than 50% of the (total minutes 35) visit spent in counseling/coordination of care regarding (discussion of declining functional status, medication changes, monitoring of labs, and goals of care).   Follow up with APP in 3-4 weeks to reassess volume/wound and with Dr. Aundra Dubin in 2 months.  Allena Katz, FNP-BC 06/23/21

## 2021-06-24 ENCOUNTER — Encounter (HOSPITAL_COMMUNITY): Payer: No Typology Code available for payment source

## 2021-06-25 ENCOUNTER — Encounter: Payer: Medicare PPO | Admitting: Internal Medicine

## 2021-06-27 ENCOUNTER — Encounter (HOSPITAL_COMMUNITY): Payer: Medicare PPO

## 2021-07-03 NOTE — Progress Notes (Signed)
Cardiology Office Note Date:  07/04/2021  Patient ID:  Anthony Rubio., DOB [birth year], MRN [id number] PCP:  Vivi Barrack, MD  Cardiologist:  Dr. Aundra Dubin Electrophysiologist: Drs Rayann Heman and Lovena Le    Chief Complaint:  91 day device visit  History of Present Illness: Anthony Rubio. is a 70 y.o. male with history of mixed (ischemic/nonischemic) CM, chronic CHF (bi-ventricular failure), AFib, DM, CKD (IV), HTN, CAD (cath 2011 with ectatic coronaries with slow flow and moderate to severe diffuse distal vessel disease (consistent with diabetes) without good interventional options. Treated medically)  He comes in today for his 25mo post device upgrade visit, had his procedure 03/13/21 with Dr. Lovena Le  He was hospitalized 04/2021 with sepsis, shock, ischemic bowel with perforation, UGI and CT show contained gastric perf with possible mass, HF team followed him He had BC 1 of 2 + for Gram + Rods. On Augmentin (suspect contaminant). TEE was avoided given gastric perforation. Did not undergo surgery given severe sepsis, HF, biVe failure. Planned for close out patient GI and surgical follow up Discharged 04/17/21  He has seen HF team since discharge a couple times.  Last 05/26/21, basically wheelchair bound apparently 2/2 DM neuropathy, Charcot joints, though reportedly working on getting his strength back, wife reported fair appetite and a new wound on his R ankle. Was volume OL and torsemide adjusted . OFF his BB 2/2 hypotension and recent shock Declined wound clinic referral Was pending GI f/u  TODAY He is accompanied by his wife He reports no concerns with his device, healing  He is concerns about his skin, bleeding/wound and thinks it is the Eliquis He is upset hat the HF clinic would not continue to rx antibiotics for his ankle wound, says he would never go back to the wound care clinic that they treated him poorly in the past. He would like a "good supply" of band aids from [place] to make  up for some of the cose he has had to pay for hospital bills. It is a little hard to keep up with him  When asked about symptoms, CP, SOB, he says no, wants to only address his skin problem, mentions he is always SOB,always carrying fluid.  Initially they are quite certain that the skin lesions, wounds started when he was changed from warfarin to Eliquis. Mentions that he has been on Eliquis and his current meds for some months the skin changes most noted in the last 3 weeks or so.  He has been to primary care at the [facility], unclear if they discussed this with them or was addressed, says they referred him to the podiatrist for the ankle wound.  No antibiotics were prescribed.  Time line again hard to follow, not sure if the skin issue was as noted at that time. His wife says that at the time of the change to Eliquis, she thinks 2-3 other medicines were added as well, not sure which ones    Device information MDT dual chamber ICD implanted 05/13/2010, gen change 02/01/18 >> upgrade to CRT-D with the addition of a left bundle lead on 03/13/2021  AFib Hx Dx Feb 2022 AAD Hx Amiodarone started March 2022 Past Medical History:  Diagnosis Date   AICD (automatic cardioverter/defibrillator) present    Aortic stenosis    a. Mild by echo 04/2011.    Arthritis    Atrial flutter ([place])    s/p DCCV 9/11   CAD (coronary artery disease)    a. LHC (  5/11) with 50-60% mid LAD, diffuse mod distal LAD up to 60-70%, small OM2 subtotally occ, mod-severe diffuse distal PLOM, severe diffuse distal RCA, PDA, and PLV  disease. Started on Plavix given concern for thrombus formation in the ectatic cors with slow flow. b. Stopped Plavix after starting Coumadin for a-flutter.   CHF (congestive heart failure) ([place])    a. Likely mixed ICM/NICM. Echo (5/11) with EF 15-20%, severe global HK, mild MR, mod d/d, mildly decreased RV fcn. RHC (5/11) with mean RA 14 mmHg, PA 38/26, mean PCWP 25 mmHg, CI 1.8.  b. TEE (9/11): EF 15% no  LV thrombus, no LAA thrombus. c. s/p ICD 11/11. d. Last echo 04/2011: LV severely dilated, EF 25%, diffuse HK, mild AS, mildly dilated LA, trivial pericardial effusion.   CKD (chronic kidney disease), stage III (HCC)    Complication of anesthesia    Slow to awaken   Diabetic neuropathy (HCC)    severe; with chronic gait dysfunction   Dysrhythmia    Foot ulcer, left (HCC)    poor healing   GERD (gastroesophageal reflux disease)    H.pyloric   History of echocardiogram 07/2015   Echo 1/17: EF 20-25%, inf-lat AK, restrictive physio, mild AS (mean 9 mmHg), mild AI, mod MR, severe LAE, mod reduced RVSF, mild RAE, mild TR, PASP 50 mmHg   HTN (hypertension)    ICD (implantable cardiac defibrillator) in place 11/11   Medtronic dual cha,ber ICD with Optivol   Neuropathy associated with endocrine disorder ([place])    feet anad finger tips   Tobacco abuse    Uncontrolled type II diabetes mellitus     Past Surgical History:  Procedure Laterality Date   BIV UPGRADE N/A 03/13/2021   Procedure: UPGRADE TO BIVI ICD;  Surgeon: Evans Lance, MD;  Location: [place] CV LAB;  Service: Cardiovascular;  Laterality: N/A;   CARDIOVERSION N/A 08/23/2017   Procedure: CARDIOVERSION;  Surgeon: Larey Dresser, MD;  Location: [facility] ENDOSCOPY;  Service: Cardiovascular;  Laterality: N/A;   CARDIOVERSION N/A 09/12/2020   Procedure: CARDIOVERSION;  Surgeon: Larey Dresser, MD;  Location: [facility] ENDOSCOPY;  Service: Cardiovascular;  Laterality: N/A;  STAT INR!!!   CARDIOVERSION N/A 10/10/2020   Procedure: CARDIOVERSION;  Surgeon: Larey Dresser, MD;  Location: [facility] ENDOSCOPY;  Service: Cardiovascular;  Laterality: N/A;   COLONOSCOPY     COLONOSCOPY WITH PROPOFOL N/A 12/01/2017   Procedure: COLONOSCOPY WITH PROPOFOL;  Surgeon: Arta Silence, MD;  Location: [place];  Service: Endoscopy;  Laterality: N/A;   ESOPHAGOGASTRODUODENOSCOPY     ICD GENERATOR CHANGEOUT N/A 02/01/2018   Procedure: ICD GENERATOR CHANGEOUT;   Surgeon: Thompson Grayer, MD;  Location: [place] CV LAB;  Service: Cardiovascular;  Laterality: N/A;   ICD implantation  2011   by JA   TEE WITHOUT CARDIOVERSION N/A 08/23/2017   Procedure: TRANSESOPHAGEAL ECHOCARDIOGRAM (TEE);  Surgeon: Larey Dresser, MD;  Location: [facility] ENDOSCOPY;  Service: Cardiovascular;  Laterality: N/A;   TEE WITHOUT CARDIOVERSION N/A 10/10/2020   Procedure: TRANSESOPHAGEAL ECHOCARDIOGRAM (TEE);  Surgeon: Larey Dresser, MD;  Location: [facility] ENDOSCOPY;  Service: Cardiovascular;  Laterality: N/A;    Current Outpatient Medications  Medication Sig Dispense Refill   amiodarone (PACERONE) 200 MG tablet Take 1 tablet (200 mg total) by mouth daily. 90 tablet 3   apixaban (ELIQUIS) 5 MG TABS tablet Take 1 tablet (5 mg total) by mouth 2 (two) times daily. 60 tablet 6   atorvastatin (LIPITOR) 80 MG tablet Take 80 mg by mouth  daily.     dapagliflozin propanediol (FARXIGA) 10 MG TABS tablet Take 10 mg by mouth daily.     digoxin (LANOXIN) 0.125 MG tablet Take 0.5 tablets (0.0625 mg total) by mouth every other day. 7.5 tablet 0   ezetimibe (ZETIA) 10 MG tablet Take 1 tablet (10 mg total) by mouth daily. 90 tablet 3   glipiZIDE (GLUCOTROL) 5 MG tablet Take by mouth daily before breakfast.     Lancets (ACCU-CHEK SOFT TOUCH) lancets Use to test blood sugar once a day E11.9 100 each 3   pantoprazole (PROTONIX) 40 MG tablet Take 1 tablet (40 mg total) by mouth 2 (two) times daily. 60 tablet 3   spironolactone (ALDACTONE) 25 MG tablet Take 1/2 tablet (12.5 mg total) by mouth daily. (Patient taking differently: Take 12.5 mg by mouth daily. HOLD...) 15 tablet 0   torsemide (DEMADEX) 20 MG tablet Take 1 tablet (20 mg total) by mouth daily. 30 tablet 3   No current facility-administered medications for this visit.    Allergies:   Erythromycin, Hydralazine, Lisinopril, and Losartan potassium   Social History:  The patient  reports that he has been smoking cigarettes. He has a 8.25 pack-year  smoking history. He has never used smokeless tobacco. He reports that he does not drink alcohol and does not use drugs.   Family History:  The patient's family history includes Cancer in his paternal grandfather; Congestive Heart Failure in his maternal grandmother; Diabetes in his paternal grandfather; Early death in his father and son; Gout in his maternal grandfather; Heart attack in his son; Stroke in his mother.  ROS:  Please see the history of present illness.    All other systems are reviewed and otherwise negative.   PHYSICAL EXAM:  VS:  BP 90/68    Pulse 69    Ht 6\' 3"  (1.905 m)    Wt 197 lb (89.4 kg)    SpO2 98%    BMI 24.62 kg/m  BMI: Body mass index is 24.62 kg/m. Well nourished, well developed, in no acute distress HEENT: normocephalic, atraumatic Neck: no JVD, carotid bruits or masses Cardiac:  RRR; no significant murmurs, no rubs, or gallops Lungs:  CTA b/l, no wheezing, rhonchi or rales Abd: soft, nontender MS: no deformity or, some atrophy Ext: trace-1+ edema, not tense edema Skin:he has a number of superficial skin wounds, some circular, some linear, some appear like scratches (though he denies itching/scratching), others look like cracking of the skin, no clear petechial like findings Neuro:  No gross deficits appreciated Psych: euthymic mood, full affect  ICD site is stable, no tethering or discomfort   EKG:  not done today  Device interrogation done today and reviewed by myself:  Battery and lead measurements are stable No arrhythmias   04/09/2021: TTE IMPRESSIONS   1. Left ventricular ejection fraction, by estimation, is <20%. The left  ventricle has severely decreased function. The left ventricle demonstrates  global hypokinesis with regional variation. The left ventricular internal  cavity size was severely  dilated. Left ventricular diastolic parameters are consistent with Grade  III diastolic dysfunction (restrictive). Elevated left ventricular   end-diastolic pressure. The E/e' is 28.   2. Right ventricular systolic function is mildly reduced. The right  ventricular size is mildly enlarged. There is moderately elevated  pulmonary artery systolic pressure. The estimated right ventricular  systolic pressure is 13.2 mmHg.   3. Left atrial size was severely dilated.   4. The mitral valve is abnormal. Mild mitral valve  regurgitation.   5. Tricuspid valve regurgitation is mild to moderate.   6. The aortic valve has an indeterminant number of cusps. There is  moderate calcification of the aortic valve. There is moderate thickening  of the aortic valve. Aortic valve regurgitation is not visualized. Mild to  moderate aortic valve stenosis. Aortic   valve area, by VTI measures 1.52 cm. Aortic valve mean gradient measures  5.0 mmHg. Aortic valve Vmax measures 1.56 m/s.   7. The inferior vena cava is dilated in size with <50% respiratory  variability, suggesting right atrial pressure of 15 mmHg.   8. Right atrial size was moderately dilated.   Comparison(s): Changes from prior study are noted. 06/21/2020: LVEF <20%.   Recent Labs: 12/12/2020: TSH 3.141 04/08/2021: B Natriuretic Peptide 2,679.7 04/09/2021: Magnesium 2.6 04/30/2021: ALT 22; Hemoglobin 15.6; Platelets 194 06/05/2021: BUN 35; Creatinine, Ser 2.23; Potassium 4.9; Sodium 137  12/12/2020: Cholesterol 99; HDL 31; LDL Cholesterol 55; Total CHOL/HDL Ratio 3.2; Triglycerides 66; VLDL 13   CrCl cannot be calculated (Patient's most recent lab result is older than the maximum 21 days allowed.).   Wt Readings from Last 3 Encounters:  07/04/21 197 lb (89.4 kg)  05/26/21 193 lb 12.8 oz (87.9 kg)  04/30/21 200 lb 12.8 oz (91.1 kg)     Other studies reviewed: Additional studies/records reviewed today include: summarized above  ASSESSMENT AND PLAN:  CRT-D Intact function, no programming changes made  CAD Denies anyginal symptoms He has f/u with Dr. Aundra Dubin in place  Mixed  CM Chronic CHF (Biventricular failure) Not felt an advanced therapies candidate He denies any new SOB or symptoms Has some edema OptiVol looks great  Paroxysmal AFib CHA2DS2Vasc is 5, on Eliquis, appropriately dosed Poorly tolerated with HF exacerbations Amiodarone 0% burden   6. Skin wounds I have discussed with them They seem to link the skin changes to the Eliquis and can change him to Xarelto and see if that helps His wife mentions other perhaps new medicines as well at the time of his Eliquis start and wonders if it could be them. Discussed that I would review his chart and discuss with providers and call them with recommendations In the meantime, get labs today as well   I have subsequently spent much time with his chart, with his current list of medicines Eliquis, amiodarone, torsemide all started March 2022 Dig 04/2021 Wilder Glade , zetia 06/2020 Lipitor, glucotrol. Aldactone, Protonix have been on his list for years I am not certain the the time-line or the appearance is c/w a drug reaction, though can not rule that out I reviewed photos of his legs with DOD, not classic of drug reaction, but agreed with recommendation for dermatology and trial or switch to Xarelto  I have spoken to the patient I recommend that he revisit with his PMD team either Dr. Jerline Pain or [facility] for evaluation of his skin/wounds, and recommend a dermatology evaluation He would like to stop Eliquis and try an alternative We can switch to Xarelto, Creat Cl is 38 with last BMET/today's weight, will switch to Xarelto15mg  daily    Disposition: F/u with Dr. Aundra Dubin next month as scheduled, I think he can see EP in 5mo,and remotes as usual  Current medicines are reviewed at length with the patient today.  The patient did not have any concerns regarding medicines.  Venetia Night, PA-C 07/04/2021 10:01 AM     [facility] [address] [place] [address] [phone] (  office)  [phone] (fax)

## 2021-07-04 ENCOUNTER — Telehealth: Payer: Self-pay | Admitting: *Deleted

## 2021-07-04 ENCOUNTER — Other Ambulatory Visit: Payer: Self-pay

## 2021-07-04 ENCOUNTER — Ambulatory Visit (INDEPENDENT_AMBULATORY_CARE_PROVIDER_SITE_OTHER): Payer: Medicare PPO | Admitting: Physician Assistant

## 2021-07-04 ENCOUNTER — Encounter: Payer: Self-pay | Admitting: Physician Assistant

## 2021-07-04 VITALS — BP 90/68 | HR 69 | Ht 75.0 in | Wt 197.0 lb

## 2021-07-04 DIAGNOSIS — I5022 Chronic systolic (congestive) heart failure: Secondary | ICD-10-CM | POA: Diagnosis not present

## 2021-07-04 DIAGNOSIS — I480 Paroxysmal atrial fibrillation: Secondary | ICD-10-CM

## 2021-07-04 DIAGNOSIS — Z79899 Other long term (current) drug therapy: Secondary | ICD-10-CM | POA: Diagnosis not present

## 2021-07-04 DIAGNOSIS — I483 Typical atrial flutter: Secondary | ICD-10-CM

## 2021-07-04 DIAGNOSIS — Z95810 Presence of automatic (implantable) cardiac defibrillator: Secondary | ICD-10-CM

## 2021-07-04 DIAGNOSIS — Z5189 Encounter for other specified aftercare: Secondary | ICD-10-CM

## 2021-07-04 LAB — CBC
Hematocrit: 47 % (ref 37.5–51.0)
Hemoglobin: 15.5 g/dL (ref 13.0–17.7)
MCH: 28.8 pg (ref 26.6–33.0)
MCHC: 33 g/dL (ref 31.5–35.7)
MCV: 87 fL (ref 79–97)
Platelets: 147 10*3/uL — ABNORMAL LOW (ref 150–450)
RBC: 5.39 x10E6/uL (ref 4.14–5.80)
RDW: 18.6 % — ABNORMAL HIGH (ref 11.6–15.4)
WBC: 5.8 10*3/uL (ref 3.4–10.8)

## 2021-07-04 LAB — CUP PACEART INCLINIC DEVICE CHECK
Battery Remaining Longevity: 85 mo
Battery Voltage: 3.02 V
Brady Statistic AP VP Percent: 93.81 %
Brady Statistic AP VS Percent: 0.17 %
Brady Statistic AS VP Percent: 6 %
Brady Statistic AS VS Percent: 0.01 %
Brady Statistic RA Percent Paced: 92.3 %
Brady Statistic RV Percent Paced: 98.91 %
Date Time Interrogation Session: 20221230184411
HighPow Impedance: 40 Ohm
HighPow Impedance: 50 Ohm
Implantable Lead Implant Date: 20111108
Implantable Lead Implant Date: 20111108
Implantable Lead Implant Date: 20220908
Implantable Lead Location: 753858
Implantable Lead Location: 753859
Implantable Lead Location: 753860
Implantable Lead Model: 3830
Implantable Lead Model: 5076
Implantable Lead Model: 6947
Implantable Pulse Generator Implant Date: 20220908
Lead Channel Impedance Value: 266 Ohm
Lead Channel Impedance Value: 304 Ohm
Lead Channel Impedance Value: 323 Ohm
Lead Channel Impedance Value: 323 Ohm
Lead Channel Impedance Value: 399 Ohm
Lead Channel Impedance Value: 513 Ohm
Lead Channel Pacing Threshold Amplitude: 0.75 V
Lead Channel Pacing Threshold Amplitude: 0.75 V
Lead Channel Pacing Threshold Amplitude: 1 V
Lead Channel Pacing Threshold Pulse Width: 0.4 ms
Lead Channel Pacing Threshold Pulse Width: 0.4 ms
Lead Channel Pacing Threshold Pulse Width: 0.4 ms
Lead Channel Sensing Intrinsic Amplitude: 2 mV
Lead Channel Sensing Intrinsic Amplitude: 2.5 mV
Lead Channel Sensing Intrinsic Amplitude: 3.5 mV
Lead Channel Sensing Intrinsic Amplitude: 3.875 mV
Lead Channel Setting Pacing Amplitude: 1.25 V
Lead Channel Setting Pacing Amplitude: 1.5 V
Lead Channel Setting Pacing Amplitude: 2 V
Lead Channel Setting Pacing Pulse Width: 0.4 ms
Lead Channel Setting Pacing Pulse Width: 0.4 ms
Lead Channel Setting Sensing Sensitivity: 0.3 mV

## 2021-07-04 LAB — TSH: TSH: 5.7 u[IU]/mL — ABNORMAL HIGH (ref 0.450–4.500)

## 2021-07-04 MED ORDER — RIVAROXABAN 15 MG PO TABS: 15.0000 mg | ORAL_TABLET | Freq: Every day | ORAL | 1 refills | Status: AC

## 2021-07-04 NOTE — Telephone Encounter (Signed)
-----   Message from [place], [place] sent at 07/04/2021  1:26 PM EST ----- Stop eliquis Start Xarelto 15mg  daily

## 2021-07-04 NOTE — Patient Instructions (Signed)
Medication Instructions:   Your physician recommends that you continue on your current medications as directed. Please refer to the Current Medication list given to you today.  *If you need a refill on your cardiac medications before your next appointment, please call your pharmacy*   Lab Work: CBC AND TSH TODAY   If you have labs (blood work) drawn today and your tests are completely normal, you will receive your results only by: [place] (if you have MyChart) OR A paper copy in the mail If you have any lab test that is abnormal or we need to change your treatment, we will call you to review the results.   Testing/Procedures: NONE ORDERED  TODAY     Follow-Up: At [facility], you and your health needs are our priority.  As part of our continuing mission to provide you with exceptional heart care, we have created designated Provider Care Teams.  These Care Teams include your primary Cardiologist (physician) and Advanced Practice Providers (APPs -  Physician Assistants and Nurse Practitioners) who all work together to provide you with the care you need, when you need it.  We recommend signing up for the patient portal called "MyChart".  Sign up information is provided on this After Visit Summary.  MyChart is used to connect with patients for Virtual Visits (Telemedicine).  Patients are able to view lab/test results, encounter notes, upcoming appointments, etc.  Non-urgent messages can be sent to your provider as well.   To learn more about what you can do with MyChart, go to [email or web address].    Your next appointment:   6 month(s)  The format for your next appointment:   In Person  Provider:   Cristopher Peru, MD    Other Instructions

## 2021-07-04 NOTE — Addendum Note (Signed)
Addended by: Claude Manges on: 07/04/2021 01:41 PM   Modules accepted: Orders

## 2021-07-04 NOTE — Telephone Encounter (Signed)
Spoke with patient aware of  medication changes and will come pick up samples Xarelto 15 mg Tuesday  07-08-21 when office opens. Prescription sent in e-scribe

## 2021-07-09 ENCOUNTER — Other Ambulatory Visit: Payer: Self-pay

## 2021-07-09 ENCOUNTER — Encounter (HOSPITAL_COMMUNITY): Payer: Self-pay

## 2021-07-09 ENCOUNTER — Inpatient Hospital Stay: Admission: AD | Admit: 2021-07-09 | Payer: Medicare PPO | Source: Ambulatory Visit | Admitting: Cardiology

## 2021-07-09 ENCOUNTER — Ambulatory Visit (HOSPITAL_COMMUNITY)
Admission: RE | Admit: 2021-07-09 | Discharge: 2021-07-09 | Disposition: A | Discharge status: Home / Self Care | Payer: Medicare PPO | Source: Ambulatory Visit | Attending: Family Medicine | Admitting: Family Medicine

## 2021-07-09 VITALS — BP 100/70 | HR 71 | Wt 202.4 lb

## 2021-07-09 DIAGNOSIS — K255 Chronic or unspecified gastric ulcer with perforation: Secondary | ICD-10-CM

## 2021-07-09 DIAGNOSIS — I5082 Biventricular heart failure: Secondary | ICD-10-CM | POA: Diagnosis present

## 2021-07-09 DIAGNOSIS — I130 Hypertensive heart and chronic kidney disease with heart failure and stage 1 through stage 4 chronic kidney disease, or unspecified chronic kidney disease: Secondary | ICD-10-CM | POA: Diagnosis not present

## 2021-07-09 DIAGNOSIS — Z7189 Other specified counseling: Secondary | ICD-10-CM

## 2021-07-09 DIAGNOSIS — I5022 Chronic systolic (congestive) heart failure: Secondary | ICD-10-CM | POA: Diagnosis not present

## 2021-07-09 DIAGNOSIS — E1122 Type 2 diabetes mellitus with diabetic chronic kidney disease: Secondary | ICD-10-CM | POA: Diagnosis not present

## 2021-07-09 DIAGNOSIS — I872 Venous insufficiency (chronic) (peripheral): Secondary | ICD-10-CM

## 2021-07-09 DIAGNOSIS — I2510 Atherosclerotic heart disease of native coronary artery without angina pectoris: Secondary | ICD-10-CM

## 2021-07-09 DIAGNOSIS — I428 Other cardiomyopathies: Secondary | ICD-10-CM | POA: Diagnosis not present

## 2021-07-09 DIAGNOSIS — G4734 Idiopathic sleep related nonobstructive alveolar hypoventilation: Secondary | ICD-10-CM

## 2021-07-09 DIAGNOSIS — Z79899 Other long term (current) drug therapy: Secondary | ICD-10-CM | POA: Insufficient documentation

## 2021-07-09 DIAGNOSIS — Z7901 Long term (current) use of anticoagulants: Secondary | ICD-10-CM | POA: Diagnosis not present

## 2021-07-09 DIAGNOSIS — I480 Paroxysmal atrial fibrillation: Secondary | ICD-10-CM | POA: Diagnosis not present

## 2021-07-09 DIAGNOSIS — N184 Chronic kidney disease, stage 4 (severe): Secondary | ICD-10-CM

## 2021-07-09 DIAGNOSIS — S91001D Unspecified open wound, right ankle, subsequent encounter: Secondary | ICD-10-CM

## 2021-07-09 DIAGNOSIS — Z87891 Personal history of nicotine dependence: Secondary | ICD-10-CM | POA: Diagnosis not present

## 2021-07-09 DIAGNOSIS — E1140 Type 2 diabetes mellitus with diabetic neuropathy, unspecified: Secondary | ICD-10-CM | POA: Insufficient documentation

## 2021-07-09 LAB — CBC
HCT: 48.5 % (ref 39.0–52.0)
Hemoglobin: 15.3 g/dL (ref 13.0–17.0)
MCH: 28.9 pg (ref 26.0–34.0)
MCHC: 31.5 g/dL (ref 30.0–36.0)
MCV: 91.5 fL (ref 80.0–100.0)
Platelets: 117 10*3/uL — ABNORMAL LOW (ref 150–400)
RBC: 5.3 MIL/uL (ref 4.22–5.81)
RDW: 22.8 % — ABNORMAL HIGH (ref 11.5–15.5)
WBC: 5.8 10*3/uL (ref 4.0–10.5)
nRBC: 0 % (ref 0.0–0.2)

## 2021-07-09 LAB — COMPREHENSIVE METABOLIC PANEL
ALT: 56 U/L — ABNORMAL HIGH (ref 0–44)
AST: 60 U/L — ABNORMAL HIGH (ref 15–41)
Albumin: 2.8 g/dL — ABNORMAL LOW (ref 3.5–5.0)
Alkaline Phosphatase: 137 U/L — ABNORMAL HIGH (ref 38–126)
Anion gap: 12 (ref 5–15)
BUN: 57 mg/dL — ABNORMAL HIGH (ref 8–23)
CO2: 19 mmol/L — ABNORMAL LOW (ref 22–32)
Calcium: 8.8 mg/dL — ABNORMAL LOW (ref 8.9–10.3)
Chloride: 103 mmol/L (ref 98–111)
Creatinine, Ser: 3.26 mg/dL — ABNORMAL HIGH (ref 0.61–1.24)
GFR, Estimated: 19 mL/min — ABNORMAL LOW (ref >60)
Glucose, Bld: 142 mg/dL — ABNORMAL HIGH (ref 70–99)
Potassium: 3.8 mmol/L (ref 3.5–5.1)
Sodium: 134 mmol/L — ABNORMAL LOW (ref 135–145)
Total Bilirubin: 3.1 mg/dL — ABNORMAL HIGH (ref 0.3–1.2)
Total Protein: 6.2 g/dL — ABNORMAL LOW (ref 6.5–8.1)

## 2021-07-09 LAB — DIGOXIN LEVEL: Digoxin Level: 0.4 ng/mL — ABNORMAL LOW (ref 0.8–2.0)

## 2021-07-09 LAB — BRAIN NATRIURETIC PEPTIDE: B Natriuretic Peptide: 2991.8 pg/mL — ABNORMAL HIGH (ref 0.0–100.0)

## 2021-07-09 MED ORDER — DOXYCYCLINE MONOHYDRATE 100 MG PO TABS: 100.0000 mg | ORAL_TABLET | Freq: Two times a day (BID) | ORAL | 0 refills | Status: AC

## 2021-07-09 NOTE — Patient Instructions (Signed)
Medication Changes:  Start Doxycycline 100mg  Twice daily for 14 days   Lab Work:  Labs done today, your results will be available in MyChart, we will contact you for abnormal readings.   Testing/Procedures:  none  Referrals:  none  Special Instructions // Education:  none  Follow-Up in: Keep follow up with Dr Aundra Dubin   At the [facility], you and your health needs are our priority. We have a designated team specialized in the treatment of Heart Failure. This Care Team includes your primary Heart Failure Specialized Cardiologist (physician), Advanced Practice Providers (APPs- Physician Assistants and Nurse Practitioners), and Pharmacist who all work together to provide you with the care you need, when you need it.   You may see any of the following providers on your designated Care Team at your next follow up:  Dr Glori Bickers Dr Haynes Kerns, NP Lyda Jester, [place] [facility] [place], [place] Audry Riles, PharmD   Please be sure to bring in all your medications bottles to every appointment.   Need to Contact us:  If you have any questions or concerns before your next appointment please send [place] a message through [place] or call our office at [phone].    TO LEAVE A MESSAGE FOR THE NURSE SELECT OPTION 2, PLEASE LEAVE A MESSAGE INCLUDING: YOUR NAME DATE OF BIRTH CALL BACK NUMBER REASON FOR CALL**this is important as we prioritize the call backs  YOU WILL RECEIVE A CALL BACK THE SAME DAY AS LONG AS YOU CALL BEFORE 4:00 PM

## 2021-07-09 NOTE — Progress Notes (Addendum)
Patient ID: Anthony Rubio., male   DOB: 03/07/1951, 71 y.o.   MRN: [id number] PCP: Dr. Jerline Pain Cardiology: Dr. Aundra Dubin  71 y.o. with history of CAD, mixed ischemic/nonischemic cardiomyopathy, and diabetes who presents for followup. He initially was hospitalized in 5/11 with acute decompensated CHF after several days of shortness of breath, orthopnea, and PND. He was diuresed and left/right heart catheterization was done. Left heart cath showed large, ectatic coronaries with slow flow and moderate to severe diffuse distal vessel disease (consistent with diabetes) without good interventional options. EF was 15-20% by echo. He was put on milrinone and further diuresed for several more days. He was titrated off milrinone and discharged on an oral medication regimen. He later developed atrial flutter requiring cardioversion. He remains in NSR.  Patient had Medtronic ICD placed in 11/11.  Repeat echo in 10/12 showed EF 25%.  Most recent echo in 1/19 showed EF 25% with severe LV dilation. He was noted to be in atrial fibrillation again in 2/19.  He had TEE-guided DCCV in 2/19 back to NSR. He has not been able tolerate even 1/2 tab tid of Bidil due to lightheadedness.   He went into atrial fibrillation in 2/22 with increased RV pacing and worsening CHF.  He had DCCV back to NSR in 3/22. However, it appears that a few days after DCCV, he went back into atrial fibrillation.  In 4/22, he had TEE-guided DCCV.  TEE in 4/22 showed EF < 20%, no thrombus, moderately decreased RV systolic function, moderate RV enlargement, moderate TR, functionally bicuspid aortic valve with mild AI (no AS).   ICD upgraded to BiV 9/22.  Admitted 104-10/13 for septic shock secondary to perforated bowel, felt to be from PUD vs gastric cancer. Eliquis held. Initially required NE for BP support. GDMT held and AHF consulted. Concern for cardiogenic shock with Co-Ox 49% and DBA started. Echo showed EF<20%. Perforation contained and general surgery  recommended conservative management due to underlying cardiac comorbidities with GI follow up for possible EGD in a few weeks. Pressors and DBA weaned off and GDMT slowly added back; Imdur and beta blocker held at discharge with low BP. Discharged home with [facility], weight 197 lbs.  Today he returns for HF follow up with his wife. He denies exertional dyspnea, but he is mostly wheelchair bound due to severe diabetic peripheral neuropathy, wife says his appetite is poor. His main complaint is open & weeping areas on his legs. I gave him keflex at our last visit and strongly encouraged Wound Care or PCP follow up, of which he declined. He was seen by EP last week, and patient indicated areas on legs were due to drug reaction from Eliquis, so Eliquis was stopped and Xarelto started. However, he has not started Xarelto and he remains off Eliquis. He was seen by podiatry at the [facility] 06/27/21 and given wound instructions for legs and feet, including Santyl with instructions to follow back in 2 weeks. Denies CP, dizziness, or PND/Orthopnea. No fever or chills. Weight at home 190s. Wife says he does not consistently take his medications. Arlyce Harman recently stopped by Nephrology due to worsening kidney function.  Medtronic device (personally reviewed): No Optivol readings transmitted, no AF, no VT/VF, <1 hr daily activity (personally reviewed).  ECG (personally reviewed): none ordered today.  Labs (9/11): LDL 61, HDL 26, K 4.8, creatinine 1.6  Labs (10/11): K 4.3, creatinine 1.3, BNP 165  Labs (11/11): K 4.9, creatinine 1.6  Labs (12/11): K 4.8, creatinine 1.7  Labs (2/12): LDL 110, HDL 28  Labs (3/12): K 4.1, creatinine 1.7 Labs (6/12): K 4.4, creatinine 1.4, BNP 125 Labs (10/12): K 4.8, creatinine 1.4 Labs (12/12): K 4.4, creatinine 1.5, BNP 20 Labs (3/13): K 5, creatinine 1.5, LDL 43, HDL 31 Labs (8/13): K 4.2, creatinine 1.3, BNP 158 Labs (05/03/14): K 4.6 Creatinine 1.4  Labs (9/16): K 4.4, creatinine  1.5 Labs (5/18): K 4.7, creatinine 1.63, pro-BNP 5249, hgb 14.4 Labs (12/18): K 4.5, creatinine 1.99, LDL 127, HDL 28, hgb 16.1 Labs (2/19): K 4.7, creatinine 2.46, LDL 78, HDL 26 Labs (11/20): K 4.9, creatinine 1.95 Labs (3/22): K 4.7, creatinine 2.9, BNP 2121 Labs (4/22): myeloma panel negative, K 4.4, creatinine 1.93 Labs (6/22): K 4.7, creatinine 2.15, LDL 55, HDL 31 Labs (10/22): K 4.3, creatinine 1.79, hgb 14.9 Labs (12/22): K 4.9, creatinine 2.23, hgb 15.5  Allergies:  1) ! Erythromycin   Past Medical History:  1. CAD: LHC (5/11) with 50-60% mid LAD, diffuse moderate distal LAD up to 60-70%, small OM2 subtotally occluded, moderate to severe diffuse distal PLOM disease, severe diffuse distal RCA, PDA, and PLV disease. Coronaries were ectatic with slow flow. Patient was started on Plavix given concern for thrombus formation in the ectatic coronaries. He stopped Plavix after starting coumadin with atrial flutter.  2. Chronic systolic CHF: Likely mixed ischemic/nonischemic. Echo (5/11) with EF 15-20%, severe global hypokinesis, mild MR, moderate diastolic dysfunction, mildly decreased RV systolic function. RHC (5/11) with mean RA 14 mmHg, PA 38/26, mean PCWP 25 mmHg, CI 1.8. Repeat echo (8/11) with EF 38-10%, severe diastolic dysfunction, mild MR, RV normal size and systolic function. Per official read, cannot rule out apical clot but it does appear to be only trabeculations. TEE (9/11): EF 15% with global hypokinesis, mild to moderate MR, no LV thrombus and no LAA thrombus.  Echo (10/12): EF 25%.  Echo (1/17) with EF 20-25%, severe LV dilation, mild aortic stenosis with AVA 1.5 cm^2, moderate MR, moderately dilated RV with moderately decreased RV systolic function, PASP 50 mmHg. Medtronic ICD.  - Echo (1/19): EF 25%, severe LV dilation, mild RV dilation with severely decreased systolic function, mild-moderate MR.  - TEE (2/19): EF 25%, moderately dilated LV, mildly dilated RV with moderately  decreased systolic function, moderate LAE/RAE, moderate TR, peak RV-RA gradient 56 mmHg, functionally bicuspid aortic valve without aortic stenosis.  - TEE (4/22): EF < 20%, no thrombus, moderately decreased RV systolic function, moderate RV enlargement, moderate TR, functionally bicuspid aortic valve with mild AI (no AS). 3. Type II diabetes 4. HTN  5. Smoker  6. Severe diabetic neuropathy with chronic gait dysfunction  7. History of left foot ulcer with poor healing  8. Atrial flutter s/p DCCV 9/11  9. Aortic stenosis: Mild by echo in 1/17. 10. Atrial fibrillation: Paroxysmal.  - DCCV to NSR in 2/19.  - DCCV to NSR in 4/22.  11. CKD: Stage 3.  Suspect diabetic nephropathy.  12. Functionally bicuspid aortic valve.   Family History:  Family History of Colon CA 1st degree relative <60 9other relative)  Heart disease (grandparent)  Stroke (other relative)  dm only in secondary relatives   Social History:  Pt lives in [place]. 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    Current Outpatient Medications  Medication Sig Dispense Refill   amiodarone (PACERONE) 200 MG tablet  Take 1 tablet (200 mg total) by mouth daily. 90 tablet 3   atorvastatin (LIPITOR) 80 MG tablet Take 80 mg by mouth daily.     dapagliflozin propanediol (FARXIGA) 10 MG TABS tablet Take 10 mg by mouth daily.     digoxin (LANOXIN) 0.125 MG tablet Take 0.5 tablets (0.0625 mg total) by mouth every other day. 7.5 tablet 0   ezetimibe (ZETIA) 10 MG tablet Take 1 tablet (10 mg total) by mouth daily. 90 tablet 3   glipiZIDE (GLUCOTROL) 5 MG tablet Take by mouth daily before breakfast.     Lancets (ACCU-CHEK SOFT TOUCH) lancets Use to test blood sugar once a day E11.9 100 each 3   pantoprazole (PROTONIX) 40 MG tablet Take 1 tablet (40 mg total) by mouth 2 (two) times daily. 60 tablet 3    torsemide (DEMADEX) 20 MG tablet Take 1 tablet (20 mg total) by mouth daily. 30 tablet 3   Rivaroxaban (XARELTO) 15 MG TABS tablet Take 1 tablet (15 mg total) by mouth daily with supper. (Patient not taking: Reported on 07/09/2021) 90 tablet 1   spironolactone (ALDACTONE) 25 MG tablet Take 1/2 tablet (12.5 mg total) by mouth daily. (Patient not taking: Reported on 07/09/2021) 15 tablet 0   No current facility-administered medications for this encounter.   Wt Readings from Last 3 Encounters:  07/09/21 91.8 kg (202 lb 6.4 oz)  07/04/21 89.4 kg (197 lb)  05/26/21 87.9 kg (193 lb 12.8 oz)   BP 100/70    Pulse 71    Wt 91.8 kg (202 lb 6.4 oz)    SpO2 94%    BMI 25.30 kg/m   General:  Arrived in [facility], chronically-ill appearing, mildly dyspneic speaking in full sentences. HEENT: sclera icteric Neck: Supple. No JVD. Carotids 2+ bilat; no bruits. No lymphadenopathy or thryomegaly appreciated. Cor: PMI nondisplaced. Regular rate & rhythm. No rubs, gallops or murmurs. Lungs: Diminished in bases Abdomen: Soft, +tender, nondistended. No hepatosplenomegaly. No bruits or masses. Good bowel sounds. Extremities: No  clubbing; 1+ BLE edema, multiple open areas on legs with open serous fluid draining, fingers dusky and cool Neuro: Alert & oriented x 3, cranial nerves grossly intact. Moves all 4 extremities w/o difficulty. Displeased affect.  Assessment/Plan: 1. Chronic Biventricular Heart Failure: Mixed ischemic/nonischemic cardiomyopathy. Recent worsening HF w/ increase in RV pacing s/p recent device upgrade to CRT-D 03/13/21.  - TEE 4/22 EF <20%, RV moderately reduced  - Echo 04/09/21 EF < 20%, RV mildly reduced, RVSP 57 mmHg, mild MR, mild to moderate TR, dilated IVC with estimated RAP 15 mmHg - Overall poor functional status, chronically NYHA Class IIIb at baseline   Long-term, likely would not be a good VAD candidate. Mobility limited (ambulates short distances with a walker with Charcot joints) and has  stage III-IV CKD. Also question of gastric malignancy (plans for repeat EGD?). Not certain home inotrope is option for him with no definite advanced therapy end point.  This was discussed this at length with him during his recent hospitalization. GDMT limited by low BP and elevated creatinine. He does not appear markedly volume overloaded on exam. He is chronically NYHA IIIb, seems to be worse lately. - Continue torsemide 20 mg daily.  BMET/BNP today. May need to back off GDMT if renal functioning worsening. - Continue digoxin 0.0625 mg daily for now. Check dig level today. - Continue Farxiga 10 mg daily.   - Off spiro with CKD. - No ARB/ARNi with low BP and CKD. - No BP  room to add back beta blocker.  - Follow renal function closely.   2. CAD: LHC in 2011 showed large, ectatic coronaries with slow flow and moderate to severe diffuse distal vessel disease (consistent with diabetes) without good interventional options. Treated medically.  - No chest pain.  - Not on ASA w/ Xarelto. - No ? blocker w/ low BP and recent shock.   3. Atrial fibrillation: Prior atrial flutter in 2011.  He was noted to be in atrial fibrillation in 2/19 and underwent TEE-guided DCCV to NSR.  DCCV again in 3/22 to NSR. TEE-guided DCCV again in 4/22.   He does not tolerate AF well with increased RV pacing percentage and worsening HF.  He is not a good ablation candidate. He is regular on exam today, no recent atrial fibrillation on device interrogation. - Xarelto samples are at EP office for him to pick up, per chart review. Advised wife to pick up today and start. - Continue Xarelto (recently switched due to ? ankle wound being a drug-interaction from Eliquis). No bleeding issues - Continue amiodarone 200 mg daily.  LFT/TSH ok 6/22, will need regular eye exam.    4. Gastric perforation with contained leak:  - UGI and CT showed contained gastric perf with possible mass.  - Tolerating diet at home. - Continue PPI. - His  wife says he has follow up with GI to discuss EGD to determine PUD vs malignancy.    5. CKD IV: Suspect diabetic nephropathy. He was previously referred to Nephrology before recent admission. - Baseline SCr 2-2.5. BMET today. 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  6. Wound, right ankle and chronic venous stasis to legs: - Wound on right ankle looks like a stage II-III.  - I again advised referral to Wound Clinic due to history of Charcot joints, DM2 & previous non-healing ulcers, but he declines this again today. I advised admission for IV abx, he also declines this. - He completed a 5 day course of Keflex at last visit. Discussed with Dr. Aundra Dubin, will give doxycycline 100 mg bid x 14 days. - Needs to be closely followed by PCP, wound care or podiatry at [facility]. Wife aware.   7. Nocturnal desaturations: Suspect untreated OSA. - Consider sleep study, although I doubt he would agree to further testing.   8. Tobacco Abuse:  - He has quit smoking since discharge. - Congratulated.  9. GOC: With no good advanced therapy end points, poor functional status, and possibility of gastric CA, I think he would benefit from hospice & palliative care services.  - Attempted to discuss this again today, however [facility] is not interested in further discussions concerning his prognosis. He continues to verbalize his main issue is his legs and ankle wound. He wants to limit the number of provider visits, lab draws, and medication changes. His wife tells me he is feeling out of control due to his declining health.  Greater than 50% of the (total minutes 35) visit spent in counseling/coordination of care regarding (discussion of declining functional status, medication changes, monitoring of labs, and goals of care).   Follow up with Dr. Aundra Dubin in 2 weeks as scheduled.  Allena Katz, FNP-BC 07/09/21

## 2021-07-10 ENCOUNTER — Encounter (HOSPITAL_COMMUNITY): Payer: Self-pay

## 2021-07-11 NOTE — Addendum Note (Signed)
Encounter addended by: Rafael Bihari, FNP on: 07/11/2021 8:10 AM  Actions taken: Clinical Note Signed

## 2021-07-14 ENCOUNTER — Telehealth (HOSPITAL_COMMUNITY): Payer: Self-pay | Admitting: Cardiology

## 2021-07-14 ENCOUNTER — Ambulatory Visit (HOSPITAL_COMMUNITY)
Admission: RE | Admit: 2021-07-14 | Discharge: 2021-07-14 | Disposition: A | Discharge status: Home / Self Care | Payer: Medicare PPO | Source: Ambulatory Visit | Attending: Cardiology | Admitting: Cardiology

## 2021-07-14 ENCOUNTER — Other Ambulatory Visit: Payer: Self-pay

## 2021-07-14 ENCOUNTER — Ambulatory Visit (INDEPENDENT_AMBULATORY_CARE_PROVIDER_SITE_OTHER): Payer: Medicare PPO

## 2021-07-14 DIAGNOSIS — I5022 Chronic systolic (congestive) heart failure: Secondary | ICD-10-CM | POA: Insufficient documentation

## 2021-07-14 DIAGNOSIS — I483 Typical atrial flutter: Secondary | ICD-10-CM | POA: Insufficient documentation

## 2021-07-14 DIAGNOSIS — Z95810 Presence of automatic (implantable) cardiac defibrillator: Secondary | ICD-10-CM

## 2021-07-14 DIAGNOSIS — Z5181 Encounter for therapeutic drug level monitoring: Secondary | ICD-10-CM | POA: Insufficient documentation

## 2021-07-14 DIAGNOSIS — Z79899 Other long term (current) drug therapy: Secondary | ICD-10-CM | POA: Diagnosis not present

## 2021-07-14 LAB — COMPREHENSIVE METABOLIC PANEL
ALT: 70 U/L — ABNORMAL HIGH (ref 0–44)
AST: 65 U/L — ABNORMAL HIGH (ref 15–41)
Albumin: 2.7 g/dL — ABNORMAL LOW (ref 3.5–5.0)
Alkaline Phosphatase: 171 U/L — ABNORMAL HIGH (ref 38–126)
Anion gap: 12 (ref 5–15)
BUN: 76 mg/dL — ABNORMAL HIGH (ref 8–23)
CO2: 18 mmol/L — ABNORMAL LOW (ref 22–32)
Calcium: 8.6 mg/dL — ABNORMAL LOW (ref 8.9–10.3)
Chloride: 101 mmol/L (ref 98–111)
Creatinine, Ser: 3.28 mg/dL — ABNORMAL HIGH (ref 0.61–1.24)
GFR, Estimated: 19 mL/min — ABNORMAL LOW (ref >60)
Glucose, Bld: 50 mg/dL — ABNORMAL LOW (ref 70–99)
Potassium: 3.8 mmol/L (ref 3.5–5.1)
Sodium: 131 mmol/L — ABNORMAL LOW (ref 135–145)
Total Bilirubin: 3.2 mg/dL — ABNORMAL HIGH (ref 0.3–1.2)
Total Protein: 5.9 g/dL — ABNORMAL LOW (ref 6.5–8.1)

## 2021-07-14 MED ORDER — TORSEMIDE 20 MG PO TABS
20.0000 mg | ORAL_TABLET | ORAL | 3 refills | Status: AC
Start: 2021-07-14 — End: 2021-10-12

## 2021-07-14 NOTE — Telephone Encounter (Signed)
Pt awar of labs results-repeat labs x 1 week  Med updated

## 2021-07-14 NOTE — Addendum Note (Signed)
Encounter addended by: Shonna Chock, CMA on: 02/04/1571 1:13 PM  Actions taken: Visit diagnoses modified, Order list changed, Diagnosis association updated

## 2021-07-15 ENCOUNTER — Telehealth: Payer: Self-pay | Admitting: *Deleted

## 2021-07-15 ENCOUNTER — Other Ambulatory Visit: Payer: Self-pay | Admitting: *Deleted

## 2021-07-15 DIAGNOSIS — Z79899 Other long term (current) drug therapy: Secondary | ICD-10-CM

## 2021-07-15 NOTE — Telephone Encounter (Signed)
-----   Message from [facility] [place] sent at 07/09/2021  3:18 PM EST ----- Labs for the most part look pretty good.  His TSH is a little elevated, this may be the amiodarone, please repeat TSH with free T3 and T4 in a month.

## 2021-07-15 NOTE — Telephone Encounter (Signed)
Spoke with patient wife and verbalized understanding with results and recommendations. Patient scheduled for labs 08-15-21

## 2021-07-16 NOTE — Progress Notes (Signed)
EPIC Encounter for ICM Monitoring  Patient Name: Anthony Rubio. is a 71 y.o. male Date: 07/16/2021 Primary Care Physican: Vivi Barrack, MD Primary Cardiologist: Aundra Dubin Electrophysiologist: Allred 05/26/2021 Office Weight: 193 lbs   Time in AT/AF  0.0 hr/day (0.0%)         Spoke with patient and heart failure questions reviewed.  Pt asymptomatic for fluid accumulation.     Optivol thoracic impedance suggesting normal fluid levels.    Prescribed:  Torsemide 20 mg take 1 tablet by mouth every other day  Spironolactone 25 mg take 0.5 tablet by mouth daily.    Labs: 07/14/2021 Creatinine 3.28, BUN 76, Potassium 3.8, Sodium 131, GFR 19 07/09/2021 Creatinine 3.26, BUN 57, Potassium 3.8, Sodium 134, GFR 19  06/05/2021 Creatinine 2.23, BUN 35, Potassium 4.9, Sodium 137, GFR 31 05/26/2021 Creatinine 2.34, BUN 32, Potassium 4.6, Sodium 135, GFR 29  04/30/2021 Creatinine 2.02, BUN 28, Potassium 4.8, Sodium 138, GFR 35  04/17/2021 Creatinine 1.79, BUN 41, Potassium 4.3, Sodium 136, GFR 40 04/16/2021 Creatinine 1.67, BUN 36, Potassium 4.6, Sodium 136, GFR 43  A complete set of results can be found in Results Review.   Recommendations:  Advised to call if experiencing any fluid symptoms.    Follow-up plan: ICM clinic phone appointment on 08/18/2021.   91 day device clinic remote transmission 08/04/2021.      EP/Cardiology Office Visits:  08/04/2021 with Dr Aundra Dubin.     Copy of ICM check sent to Dr. Rayann Heman.    3 month ICM trend: 07/16/2021.    12-14 Month ICM trend:     [name], RN 07/16/2021 3:03 PM

## 2021-07-18 ENCOUNTER — Other Ambulatory Visit (HOSPITAL_COMMUNITY): Payer: Self-pay

## 2021-07-18 ENCOUNTER — Telehealth: Payer: Self-pay

## 2021-07-18 DIAGNOSIS — M351 Other overlap syndromes: Secondary | ICD-10-CM

## 2021-07-18 NOTE — Telephone Encounter (Signed)
Patient left voice mail message requesting to have Dr Claris Gladden office call him.  No further details what call is regarding.  Sent to HF clinic triage pool for follow up.

## 2021-07-18 NOTE — Progress Notes (Signed)
Orders Placed This Encounter  Procedures   AMB referral to wound care center    Referral Priority:   Routine    Referral Type:   Consultation    Number of Visits Requested:   1

## 2021-07-18 NOTE — Telephone Encounter (Signed)
Tried calling patient, no answer,unable to leave message, vm full Will try again later.

## 2021-07-22 ENCOUNTER — Emergency Department (HOSPITAL_COMMUNITY)
Admission: EM | Admit: 2021-07-22 | Discharge: 2021-08-06 | Disposition: E | Payer: Medicare PPO | Attending: Emergency Medicine | Admitting: Emergency Medicine

## 2021-07-22 ENCOUNTER — Encounter (HOSPITAL_COMMUNITY): Payer: Self-pay | Admitting: Emergency Medicine

## 2021-07-22 ENCOUNTER — Other Ambulatory Visit: Payer: Self-pay | Admitting: Cardiology

## 2021-07-22 ENCOUNTER — Other Ambulatory Visit (HOSPITAL_COMMUNITY): Payer: Medicare PPO

## 2021-07-22 DIAGNOSIS — S81801A Unspecified open wound, right lower leg, initial encounter: Secondary | ICD-10-CM | POA: Insufficient documentation

## 2021-07-22 DIAGNOSIS — E875 Hyperkalemia: Secondary | ICD-10-CM | POA: Diagnosis not present

## 2021-07-22 DIAGNOSIS — Z7984 Long term (current) use of oral hypoglycemic drugs: Secondary | ICD-10-CM | POA: Insufficient documentation

## 2021-07-22 DIAGNOSIS — Z7901 Long term (current) use of anticoagulants: Secondary | ICD-10-CM | POA: Diagnosis not present

## 2021-07-22 DIAGNOSIS — N189 Chronic kidney disease, unspecified: Secondary | ICD-10-CM | POA: Diagnosis not present

## 2021-07-22 DIAGNOSIS — S81802A Unspecified open wound, left lower leg, initial encounter: Secondary | ICD-10-CM | POA: Insufficient documentation

## 2021-07-22 DIAGNOSIS — E162 Hypoglycemia, unspecified: Secondary | ICD-10-CM | POA: Diagnosis not present

## 2021-07-22 DIAGNOSIS — X58XXXA Exposure to other specified factors, initial encounter: Secondary | ICD-10-CM | POA: Diagnosis not present

## 2021-07-22 DIAGNOSIS — I469 Cardiac arrest, cause unspecified: Secondary | ICD-10-CM | POA: Insufficient documentation

## 2021-07-22 DIAGNOSIS — Z79899 Other long term (current) drug therapy: Secondary | ICD-10-CM | POA: Diagnosis not present

## 2021-07-22 DIAGNOSIS — I509 Heart failure, unspecified: Secondary | ICD-10-CM | POA: Diagnosis not present

## 2021-07-22 DIAGNOSIS — R400 Somnolence: Secondary | ICD-10-CM | POA: Diagnosis present

## 2021-07-22 LAB — CBG MONITORING, ED: Glucose-Capillary: 66 mg/dL — ABNORMAL LOW (ref 70–99)

## 2021-07-22 MED ORDER — DEXTROSE 50 % IV SOLN
INTRAVENOUS | Status: AC | PRN
Start: 2021-07-22 — End: 2021-07-22
  Administered 2021-07-22: 1 via INTRAVENOUS

## 2021-07-22 MED ORDER — SODIUM BICARBONATE 8.4 % IV SOLN
INTRAVENOUS | Status: AC | PRN
Start: 2021-07-22 — End: 2021-07-22
  Administered 2021-07-22: 50 meq via INTRAVENOUS

## 2021-07-22 MED ORDER — CALCIUM CHLORIDE 10 % IV SOLN
INTRAVENOUS | Status: AC | PRN
  Administered 2021-07-22: 1 g via INTRAVENOUS

## 2021-07-22 MED ORDER — EPINEPHRINE 1 MG/10ML IJ SOSY
PREFILLED_SYRINGE | INTRAMUSCULAR | Status: AC | PRN
Start: 2021-07-22 — End: 2021-07-22
  Administered 2021-07-22 (×3): 1 mg via INTRAVENOUS

## 2021-07-29 ENCOUNTER — Encounter (HOSPITAL_COMMUNITY): Payer: Self-pay

## 2021-07-29 ENCOUNTER — Ambulatory Visit (HOSPITAL_COMMUNITY): Admit: 2021-07-29 | Payer: Medicare PPO | Admitting: Gastroenterology

## 2021-07-29 SURGERY — ESOPHAGOGASTRODUODENOSCOPY (EGD) WITH PROPOFOL
Anesthesia: Monitor Anesthesia Care | Laterality: Bilateral

## 2021-08-04 ENCOUNTER — Encounter (HOSPITAL_COMMUNITY): Payer: No Typology Code available for payment source | Admitting: Cardiology

## 2021-08-06 DIAGNOSIS — 419620001 Death: Secondary | SNOMED CT | POA: Diagnosis not present

## 2021-08-06 NOTE — ED Provider Notes (Signed)
[facility] EMERGENCY DEPARTMENT Provider Note   CSN: [id number] Arrival date & time: 07/24/2021  3151     History  Chief Complaint  Patient presents with   CPR    Anthony Rubio. is a 71 y.o. male.  The history is provided by the EMS personnel, the spouse and medical records.  Anthony Rubio. is a 71 y.o. male who presents to the Emergency Department complaining of CPR in progress.  Level V caveat due to unresponsiveness.  Hx is provided by EMS.  Per report they were called out for hypoglycemia.  On EMS arrival pt with BS 90s, pt awake and conversant.  Within five minutes pt became unresponsive and was found to be pulseless and CPR was initiated.  Prior to ED arrival had king airway placed and was treated with epi, amiodarone and defibrillated twice for vfib.  CPR was initiated at 05 15.  Per EMS report he did have 2 brief episodes of return of circulation but they were not sustained.  Home Medications Prior to Admission medications   Medication Sig Start Date End Date Taking? Authorizing Provider  amiodarone (PACERONE) 200 MG tablet Take 1 tablet (200 mg total) by mouth daily. 01/03/21   Allred, Jeneen Rinks, MD  atorvastatin (LIPITOR) 80 MG tablet Take 80 mg by mouth daily.    [provider]  dapagliflozin propanediol (FARXIGA) 10 MG TABS tablet Take 10 mg by mouth daily.    [provider]  digoxin (LANOXIN) 0.125 MG tablet Take 0.5 tablets (0.0625 mg total) by mouth every other day. 05/15/21 07/09/21  Larey Dresser, MD  doxycycline (ADOXA) 100 MG tablet Take 1 tablet (100 mg total) by mouth 2 (two) times daily for 14 days. 07/09/21 08/01/2021  Rafael Bihari, FNP  ezetimibe (ZETIA) 10 MG tablet Take 1 tablet (10 mg total) by mouth daily. 09/09/20   Milford, Maricela Bo, FNP  glipiZIDE (GLUCOTROL) 5 MG tablet Take by mouth daily before breakfast.    [provider]  Lancets (ACCU-CHEK SOFT TOUCH) lancets Use to test blood sugar once a day E11.9 08/04/18    Vivi Barrack, MD  pantoprazole (PROTONIX) 40 MG tablet Take 1 tablet (40 mg total) by mouth 2 (two) times daily. 05/22/21 07/09/21  Larey Dresser, MD  Rivaroxaban (XARELTO) 15 MG TABS tablet Take 1 tablet (15 mg total) by mouth daily with supper. Patient not taking: Reported on 07/09/2021 07/04/21   Baldwin Jamaica, PA-C  spironolactone (ALDACTONE) 25 MG tablet Take 1/2 tablet (12.5 mg total) by mouth daily. Patient not taking: Reported on 07/09/2021 04/18/21 05/26/22  Darliss Cheney, MD  torsemide (DEMADEX) 20 MG tablet Take 1 tablet (20 mg total) by mouth every other day. 07/14/21 10/12/21  Larey Dresser, MD      Allergies    Erythromycin, Hydralazine, Lisinopril, Losartan potassium, and Eliquis [apixaban]    Review of Systems   Review of Systems  Unable to perform ROS: Patient unresponsive   Physical Exam Updated Vital Signs Pulse (!) 0  Physical Exam Vitals and nursing note reviewed.  Constitutional:      Appearance: He is ill-appearing.  HENT:     Head: Normocephalic and atraumatic.     Comments: Pupils midsize and nonreactive bilaterally Cardiovascular:     Heart sounds: No murmur heard.    Comments: 2+ femoral pulses with CPR.  Absent pulses without CPR Pulmonary:     Comments: King airway in place.  Good air movement bilaterally  with bagged ventilations Abdominal:     Palpations: Abdomen is soft.     Tenderness: There is no abdominal tenderness. There is no guarding or rebound.  Musculoskeletal:     Comments: Multiple wounds to bilateral distal lower extremities.  There is edema to the right lower extremity  Skin:    General: Skin is warm and dry.     Comments: Petechial rash to chest  Neurological:     Comments: GCS 1-1-1    ED Results / Procedures / Treatments   Labs (all labs ordered are listed, but only abnormal results are displayed) Labs Reviewed  CBG MONITORING, ED - Abnormal; Notable for the following components:      Result Value   Glucose-Capillary  66 (*)    All other components within normal limits  I-STAT CHEM 8, ED    EKG None  Radiology No results found.  Procedures Procedures   CRITICAL CARE Performed by: Quintella Reichert   Total critical care time: 45 minutes  Critical care time was exclusive of separately billable procedures and treating other patients.  Critical care was necessary to treat or prevent imminent or life-threatening deterioration.  Critical care was time spent personally by me on the following activities: development of treatment plan with patient and/or surrogate as well as nursing, discussions with consultants, evaluation of patient's response to treatment, examination of patient, obtaining history from patient or surrogate, ordering and performing treatments and interventions, ordering and review of laboratory studies, ordering and review of radiographic studies, pulse oximetry and re-evaluation of patient's condition.  Cardiopulmonary Resuscitation (CPR) Procedure Note Directed/Performed by: Quintella Reichert I personally directed ancillary staff and/or performed CPR in an effort to regain return of spontaneous circulation and to maintain cardiac, neuro and systemic perfusion.   Medications Ordered in ED Medications  EPINEPHrine (ADRENALIN) 1 MG/10ML injection (1 mg Intravenous Given 08-01-2021 0645)  calcium chloride injection (1 g Intravenous Given 2021-08-01 0636)  sodium bicarbonate injection (50 mEq Intravenous Given 2021/08/01 0633)  dextrose 50 % solution (1 ampule Intravenous Given 2021-08-01 3762)    ED Course/ Medical Decision Making/ A&P                           Medical Decision Making Risk Prescription drug management.   Patient with CKD, CHF with ICD in place here for evaluation of cardiac arrest with CPR of greater than 1 hour at time of ED presentation.  ACLS was continued on patient's ED arrival with respiratory therapy maintaining ventilations through the [facility] airway.  He was treated with  bicarb and calcium for possible hyperkalemia in setting of his kidney disease.  He was treated with additional epinephrine for continued cardiac arrest.  He did very briefly have episodes of a few beats of palpable pulses, which quickly dissipated.  Unable to obtain a perfusing return of circulation.    During resuscitation i-STAT Chem-8 with sodium 116, potassium greater than 8.5, bicarb 16, glucose 81, BUN greater than 130, creatinine 4, hemoglobin 21.  Given persistent CPR, profound hyperkalemia with greater than an hour of poor perfusion as well as multiple severe and advanced medical conditions resuscitation was stopped.  Bedside ultrasound with no cardiac activity.  Time of death pronounced at 0700  Additional history was obtained from patient's wife after his initial ED presentation.  History from wife was obtained during CPR ongoing.  Wife states that he has been experiencing recurrent episodes of hypoglycemia over the last several days with  blood sugars in the 40s.  He has been having progressive weakness and difficulty at home.  He had been initially refusing transfer for to the hospital but when he became worse she called 911.           Final Clinical Impression(s) / ED Diagnoses Final diagnoses:  Cardiac arrest [facility])    Rx / [place] Orders ED Discharge Orders     None         Quintella Reichert, MD 2021/08/10 [phone]

## 2021-08-06 NOTE — ED Notes (Signed)
No cardiac activity present on bedside US

## 2021-08-06 NOTE — ED Notes (Signed)
ET Tube removed per MD Ralene Bathe instruction

## 2021-08-06 NOTE — Progress Notes (Signed)
Chaplain responded to post CPR death.  Chaplain arrived and spouse was bedside.  Chaplain provided ministry of presence and empathetic/reflective listening allowing space for the spouse to share and weep and be present in disbelief.  Their daughter arrived from [place].  Chaplain escorted spouse to meet daughter as PA shared the news and then escorted back to bedside.  Funeral home not known at this time, chaplain provided the family with patient placement card and verified home address.  Family asked for a few minutes alone.  Chaplain connected with RN. Chaplain Katherene Ponto, [place].    08/05/2021 0820  Clinical Encounter Type  Visited With Patient and family together;Health care provider  Visit Type Spiritual support;ED;Death  Referral From Nurse  Consult/Referral To Chaplain  Spiritual Encounters  Spiritual Needs Prayer;Emotional;Grief support

## 2021-08-06 NOTE — ED Notes (Signed)
Chaplain paged per RN request

## 2021-08-06 NOTE — Code Documentation (Signed)
Patient time of death occurred at 0700

## 2021-08-06 NOTE — ED Triage Notes (Addendum)
Pt in from home as CPR, initially started at [facility]. Per EMS, they were called out for "low CBG's" - 90 when they checked. While assessing, pt went agonal and unresponsive, then CPR was started. Shocked x 2 en route, 8 epi's, 300mg  Amio, D10 15g, 1368ml NS. King airway in place. Hx CKDIII, DMII, pacemaker

## 2021-08-06 DEATH — deceased

## 2021-08-15 ENCOUNTER — Other Ambulatory Visit: Payer: Medicare PPO

## 2022-09-04 IMAGING — RF DG UGI W SINGLE CM
11 of 14 series · 12 of 24 positions shown · non-contrast
Comparison: NONE.

CLINICAL DATA: A 70-year-old male presents with suspected
perforated gastric ulcer.

EXAM:
DG UGI W SINGLE CM
TECHNIQUE: Scout radiograph was obtained. Single contrast examination was
performed using thin liquid barium. This exam was performed by
Latanya Jonhson, and was supervised and interpreted by Roberto
Menair.
FLUOROSCOPY TIME:  Radiation Exposure Index (as provided by the
fluoroscopic device): 54 seconds
If the device does not provide the exposure index:
Fluoroscopy Time:  19.1 mGy
Number of Acquired Images:  5

[Series 2: cp_standard · 0.19mm/px · 1 of 1 slices shown (1 of 10)]
[im 1/1]
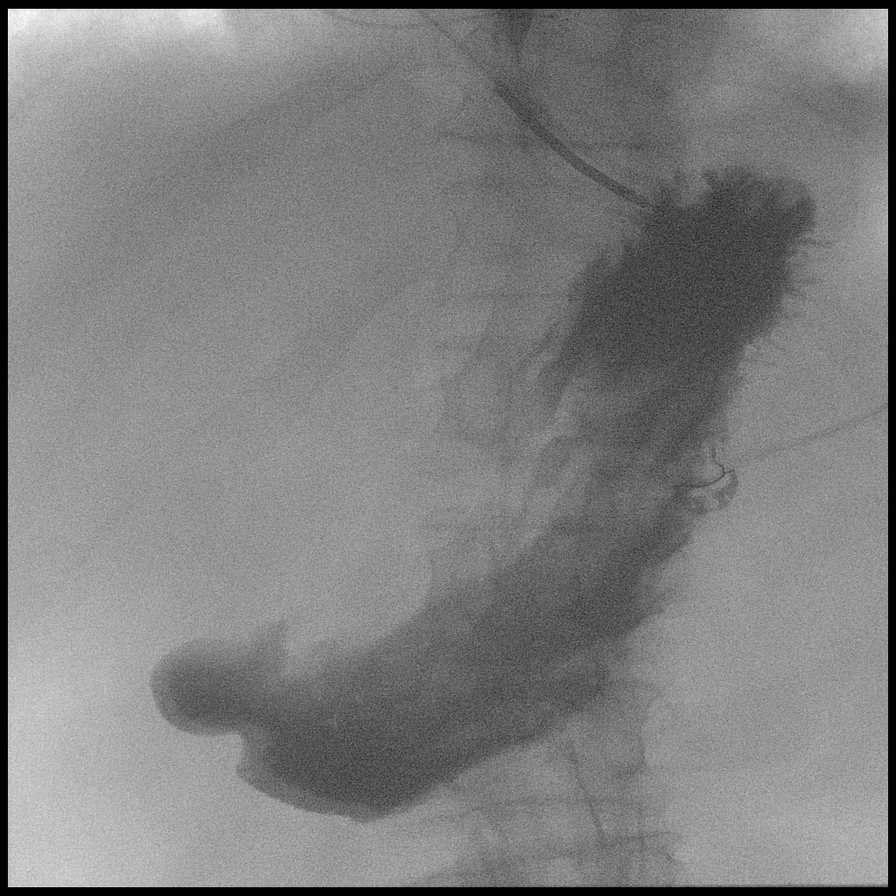

[Series 5: cp_standard · 0.19mm/px · 1 of 1 slices shown (2 of 10)]
[im 1/1]
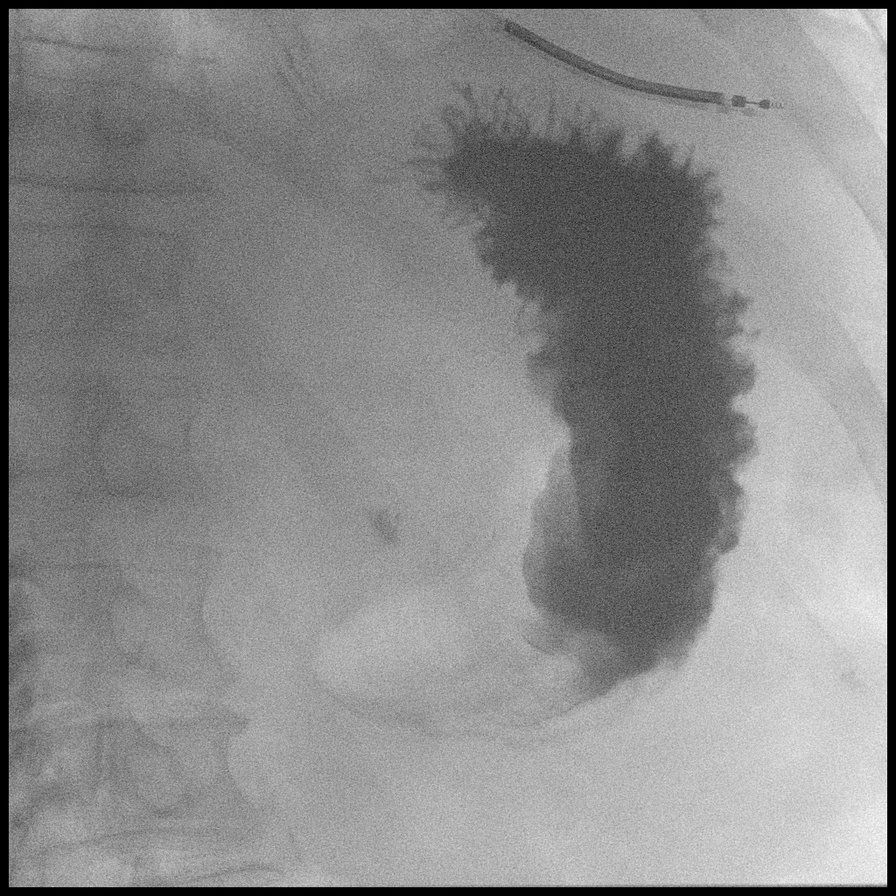

[Series 7: cp_standard · 0.55mm/px · 1 of 6 frames shown (3 of 10)]
[frame 4/6]
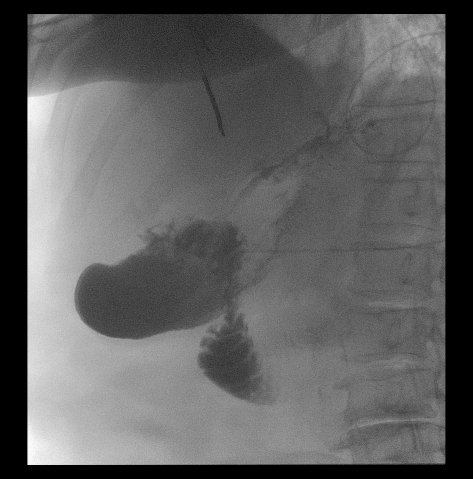

[Series 8: cp_standard · 0.37mm/px · 1 of 13 frames shown (4 of 10)]
[frame 2/13]
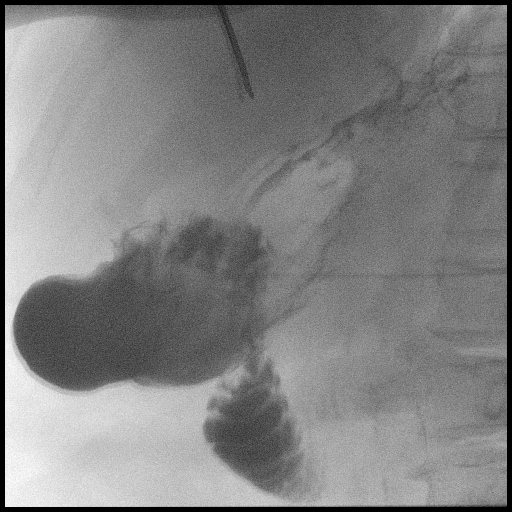

[Series 9: cp_standard · 0.37mm/px · 2 of 5 frames shown (5 of 10)]
[frame 1/5]
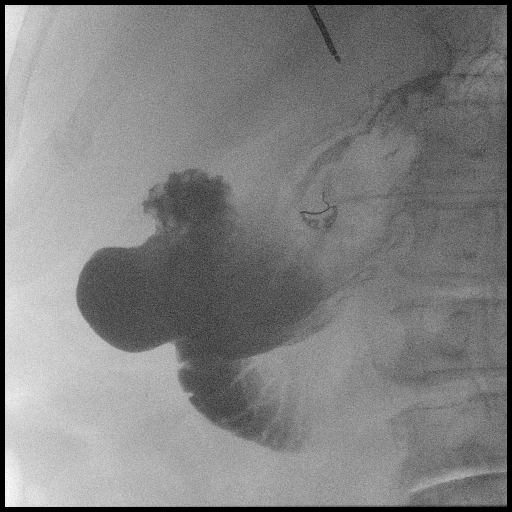
[frame 5/5]
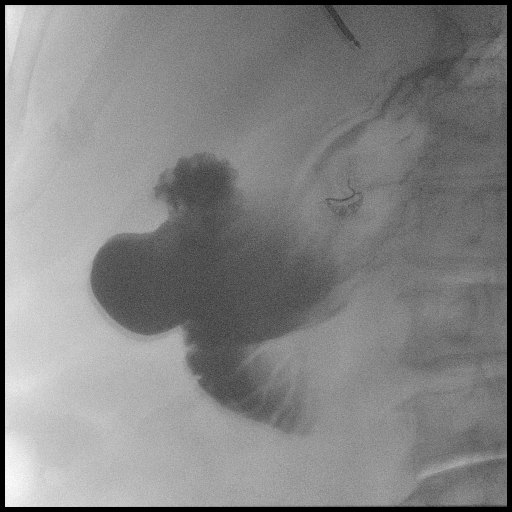

[Series 10: cp_standard · 0.37mm/px · 1 of 4 frames shown (6 of 10)]
[frame 4/4]
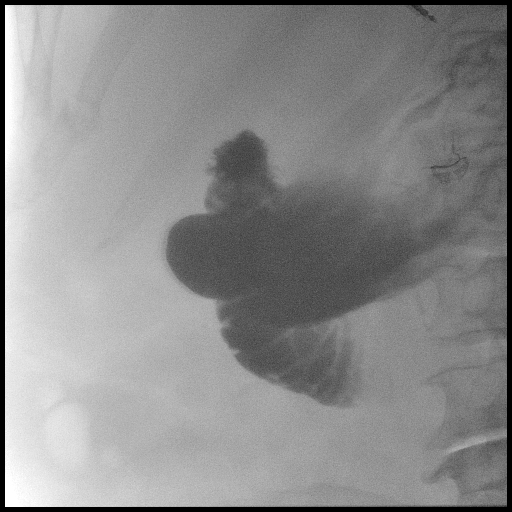

[Series 11: cp_standard · 0.37mm/px · 1 of 4 frames shown (7 of 10)]
[frame 4/4]
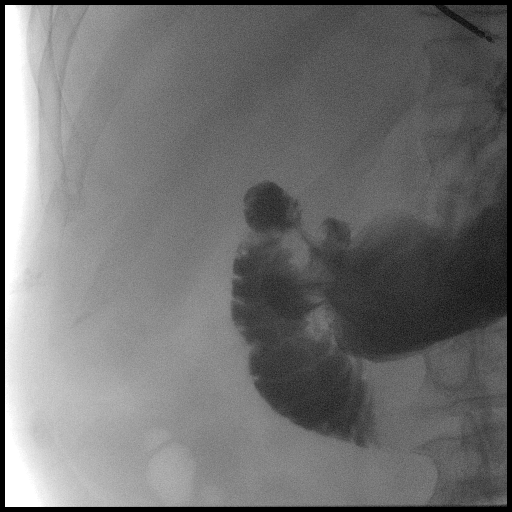

[Series 12: cp_standard · 0.37mm/px · 1 of 9 frames shown (8 of 10)]
[frame 8/9]
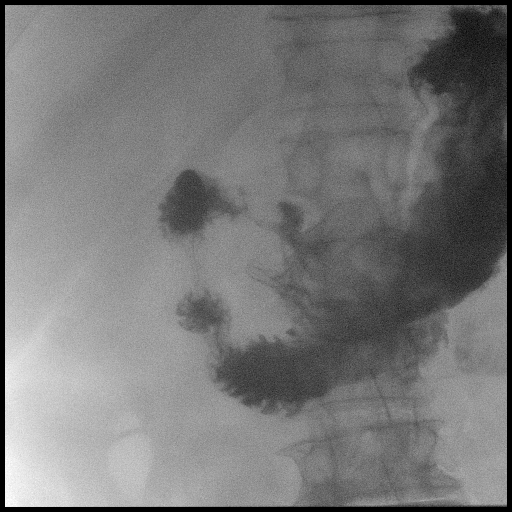

[Series 13: fluoro_iodine_singleshot_bw · 0.18mm/px · 1 of 1 slices shown]
[im 1/1]
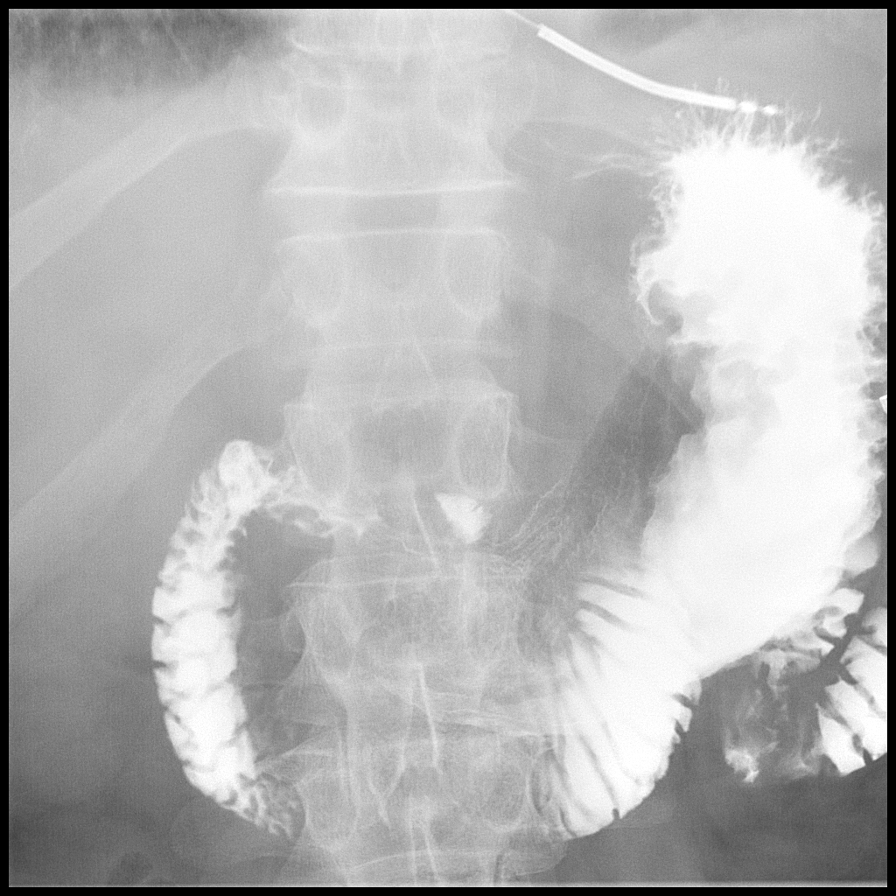

[Series 15: cp_standard · 0.37mm/px · 1 of 3 frames shown (9 of 10)]
[frame 2/3]
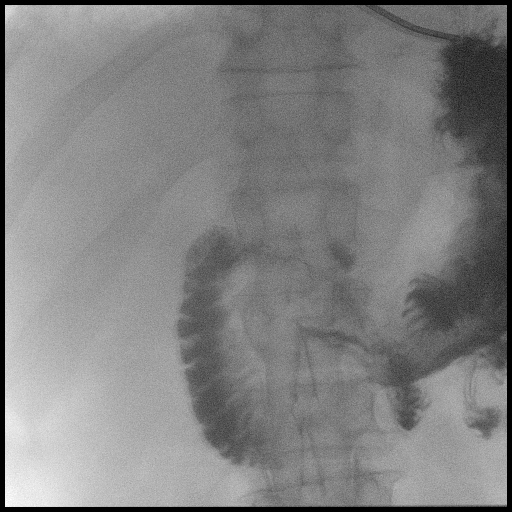

[Series 17: cp_standard · 0.18mm/px · 1 of 1 slices shown (10 of 10)]
[im 1/1]
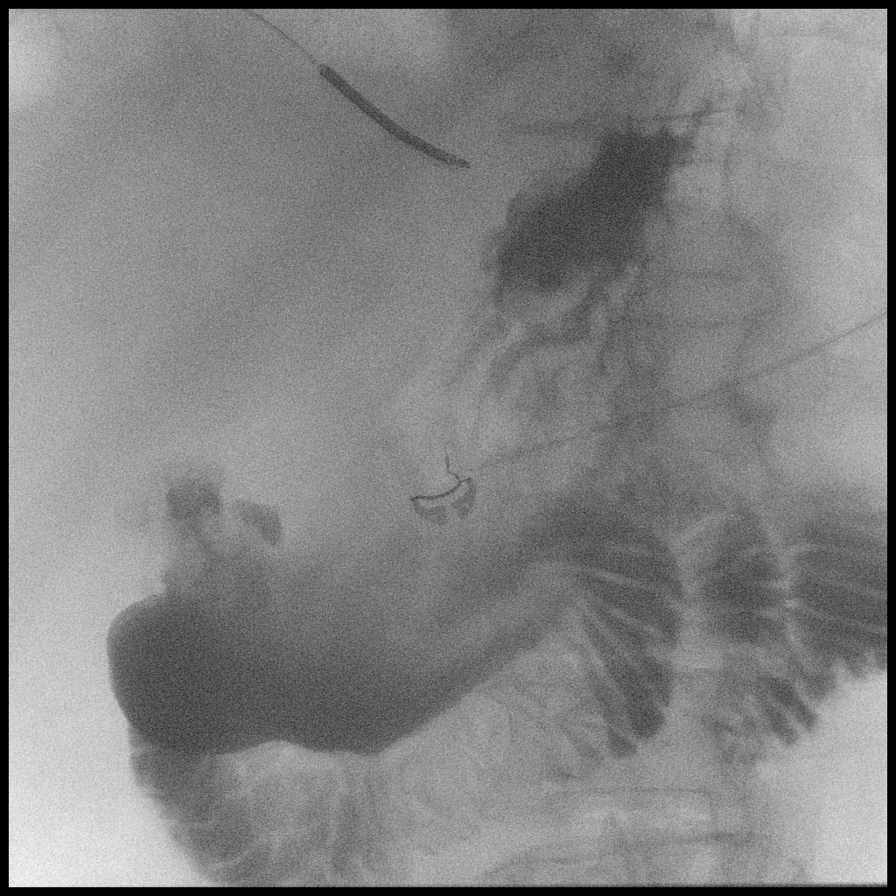

[12 of 24 positions shown; findings below may reference images not displayed]

FINDINGS: Scout Radiograph: Single scout image obtained without increased
density in the area of concern along the distal stomach. EKG leads
project over the abdomen.

Spinal degenerative changes are noted.

Esophagus: Esophagus not assessed on the current study which was
focused on the area of concern about the stomach.

Stomach: Stomach with signs of distortion along the superior antrum.
Focal outpouching of contrast demonstrated on the current study at
the site of suspected perforation seen on recent CT. Subtle linear
area tracking away from this location suggested on lateral
projections this did not changed substantially over a series of
maneuvers attempting to increase the flow across the area with
lateral and oblique positioning. The collection of contrast beyond
the gastric lumen measures 11 x 8 mm at maximal dimension. This
study was followed with CT imaging as outlined in subsequent CT
report.

Emptying of the stomach was not impaired and contrast filled the
duodenum.
IMPRESSION: Large gastric ulcer with largely contained area of perforation along
the greater curvature of the stomach with a linear band of contrast
that is quite subtle seen on oblique and lateral projections that
may extend along the lesser curvature but seen only on one view, see
subsequent CT imaging for further detail.
